# Patient Record
Sex: Male | Born: 1935 | Race: White | Hispanic: No | Marital: Married | Smoking: Former smoker
Health system: Southern US, Community
[De-identification: ages and names within clinical notes are randomized; demographics above are authoritative.]

## PROBLEM LIST (undated history)

## (undated) DIAGNOSIS — I509 Heart failure, unspecified: Secondary | ICD-10-CM

## (undated) DIAGNOSIS — M1990 Unspecified osteoarthritis, unspecified site: Secondary | ICD-10-CM

## (undated) DIAGNOSIS — E7800 Pure hypercholesterolemia, unspecified: Secondary | ICD-10-CM

## (undated) DIAGNOSIS — Z973 Presence of spectacles and contact lenses: Secondary | ICD-10-CM

## (undated) DIAGNOSIS — I4891 Unspecified atrial fibrillation: Secondary | ICD-10-CM

## (undated) DIAGNOSIS — K219 Gastro-esophageal reflux disease without esophagitis: Secondary | ICD-10-CM

## (undated) DIAGNOSIS — I2510 Atherosclerotic heart disease of native coronary artery without angina pectoris: Secondary | ICD-10-CM

## (undated) DIAGNOSIS — N200 Calculus of kidney: Secondary | ICD-10-CM

## (undated) DIAGNOSIS — H269 Unspecified cataract: Secondary | ICD-10-CM

## (undated) DIAGNOSIS — N289 Disorder of kidney and ureter, unspecified: Secondary | ICD-10-CM

## (undated) DIAGNOSIS — I10 Essential (primary) hypertension: Secondary | ICD-10-CM

## (undated) DIAGNOSIS — I252 Old myocardial infarction: Secondary | ICD-10-CM

## (undated) DIAGNOSIS — I350 Nonrheumatic aortic (valve) stenosis: Secondary | ICD-10-CM

## (undated) DIAGNOSIS — K621 Rectal polyp: Secondary | ICD-10-CM

## (undated) DIAGNOSIS — R55 Syncope and collapse: Secondary | ICD-10-CM

## (undated) DIAGNOSIS — K4090 Unilateral inguinal hernia, without obstruction or gangrene, not specified as recurrent: Secondary | ICD-10-CM

## (undated) DIAGNOSIS — D351 Benign neoplasm of parathyroid gland: Secondary | ICD-10-CM

## (undated) HISTORY — PX: ORIF FEMUR FRACTURE: SHX2119

## (undated) HISTORY — DX: Rectal polyp: K62.1

## (undated) HISTORY — DX: Unspecified atrial fibrillation: I48.91

## (undated) HISTORY — PX: UMBILICAL HERNIA REPAIR: SHX196

## (undated) HISTORY — DX: Disorder of kidney and ureter, unspecified: N28.9

## (undated) HISTORY — PX: OTHER SURGICAL HISTORY: SHX169

## (undated) HISTORY — DX: Pure hypercholesterolemia, unspecified: E78.00

## (undated) HISTORY — PX: INGUINAL HERNIA REPAIR: SUR1180

## (undated) HISTORY — DX: Atherosclerotic heart disease of native coronary artery without angina pectoris: I25.10

## (undated) HISTORY — PX: CATARACT EXTRACTION: SUR2

## (undated) HISTORY — PX: FRACTURE SURGERY: SHX138

## (undated) HISTORY — DX: Benign neoplasm of parathyroid gland: D35.1

## (undated) HISTORY — DX: Nonrheumatic aortic (valve) stenosis: I35.0

## (undated) HISTORY — PX: COLONOSCOPY W/ BIOPSIES AND POLYPECTOMY: SHX1376

## (undated) HISTORY — DX: Hypercalcemia: E83.52

## (undated) HISTORY — DX: Essential (primary) hypertension: I10

---

## 1996-07-19 HISTORY — PX: CORONARY STENT PLACEMENT: SHX1402

## 1997-07-22 ENCOUNTER — Inpatient Hospital Stay (HOSPITAL_COMMUNITY): Admission: EM | Admit: 1997-07-22 | Discharge: 1997-07-26 | Payer: Self-pay | Admitting: Emergency Medicine

## 1997-07-28 ENCOUNTER — Other Ambulatory Visit: Admission: RE | Admit: 1997-07-28 | Discharge: 1997-07-28 | Payer: Self-pay | Admitting: Orthopedic Surgery

## 1997-08-04 ENCOUNTER — Other Ambulatory Visit: Admission: RE | Admit: 1997-08-04 | Discharge: 1997-08-04 | Payer: Self-pay | Admitting: Orthopedic Surgery

## 1997-08-11 ENCOUNTER — Other Ambulatory Visit: Admission: RE | Admit: 1997-08-11 | Discharge: 1997-08-11 | Payer: Self-pay | Admitting: Orthopedic Surgery

## 1997-08-14 ENCOUNTER — Other Ambulatory Visit: Admission: RE | Admit: 1997-08-14 | Discharge: 1997-08-14 | Payer: Self-pay | Admitting: Orthopedic Surgery

## 1997-08-18 ENCOUNTER — Other Ambulatory Visit: Admission: RE | Admit: 1997-08-18 | Discharge: 1997-08-18 | Payer: Self-pay | Admitting: Orthopedic Surgery

## 1997-08-20 ENCOUNTER — Other Ambulatory Visit: Admission: RE | Admit: 1997-08-20 | Discharge: 1997-08-20 | Payer: Self-pay | Admitting: Cardiology

## 1998-05-26 ENCOUNTER — Emergency Department (HOSPITAL_COMMUNITY): Admission: EM | Admit: 1998-05-26 | Discharge: 1998-05-26 | Payer: Self-pay | Admitting: Emergency Medicine

## 1998-05-26 ENCOUNTER — Encounter: Payer: Self-pay | Admitting: Emergency Medicine

## 1998-05-28 ENCOUNTER — Emergency Department (HOSPITAL_COMMUNITY): Admission: EM | Admit: 1998-05-28 | Discharge: 1998-05-28 | Payer: Self-pay | Admitting: Emergency Medicine

## 1999-11-18 ENCOUNTER — Encounter: Payer: Self-pay | Admitting: *Deleted

## 1999-11-18 ENCOUNTER — Encounter: Admission: RE | Admit: 1999-11-18 | Discharge: 1999-11-18 | Payer: Self-pay | Admitting: *Deleted

## 2001-04-18 ENCOUNTER — Ambulatory Visit (HOSPITAL_COMMUNITY): Admission: RE | Admit: 2001-04-18 | Discharge: 2001-04-18 | Payer: Self-pay | Admitting: Gastroenterology

## 2002-10-20 ENCOUNTER — Encounter: Payer: Self-pay | Admitting: General Surgery

## 2002-10-22 ENCOUNTER — Ambulatory Visit (HOSPITAL_COMMUNITY): Admission: RE | Admit: 2002-10-22 | Discharge: 2002-10-22 | Payer: Self-pay | Admitting: General Surgery

## 2002-10-23 ENCOUNTER — Emergency Department (HOSPITAL_COMMUNITY): Admission: AD | Admit: 2002-10-23 | Discharge: 2002-10-23 | Payer: Self-pay | Admitting: Emergency Medicine

## 2002-10-23 ENCOUNTER — Emergency Department (HOSPITAL_COMMUNITY): Admission: EM | Admit: 2002-10-23 | Discharge: 2002-10-23 | Payer: Self-pay | Admitting: Emergency Medicine

## 2002-10-24 ENCOUNTER — Emergency Department (HOSPITAL_COMMUNITY): Admission: EM | Admit: 2002-10-24 | Discharge: 2002-10-24 | Payer: Self-pay | Admitting: Emergency Medicine

## 2006-05-16 ENCOUNTER — Ambulatory Visit (HOSPITAL_COMMUNITY): Admission: RE | Admit: 2006-05-16 | Discharge: 2006-05-16 | Payer: Self-pay | Admitting: Surgery

## 2006-09-18 ENCOUNTER — Ambulatory Visit (HOSPITAL_COMMUNITY): Admission: RE | Admit: 2006-09-18 | Discharge: 2006-09-18 | Payer: Self-pay | Admitting: Family Medicine

## 2006-10-24 ENCOUNTER — Encounter: Admission: RE | Admit: 2006-10-24 | Discharge: 2006-10-24 | Payer: Self-pay | Admitting: Family Medicine

## 2008-08-17 ENCOUNTER — Inpatient Hospital Stay (HOSPITAL_COMMUNITY): Admission: EM | Admit: 2008-08-17 | Discharge: 2008-08-18 | Payer: Self-pay | Admitting: Emergency Medicine

## 2008-08-17 ENCOUNTER — Ambulatory Visit: Payer: Self-pay | Admitting: Internal Medicine

## 2008-08-17 ENCOUNTER — Encounter: Payer: Self-pay | Admitting: Cardiology

## 2008-08-18 HISTORY — PX: CARDIAC CATHETERIZATION: SHX172

## 2009-04-29 ENCOUNTER — Ambulatory Visit (HOSPITAL_BASED_OUTPATIENT_CLINIC_OR_DEPARTMENT_OTHER): Admission: RE | Admit: 2009-04-29 | Discharge: 2009-04-29 | Payer: Self-pay | Admitting: General Surgery

## 2009-11-23 ENCOUNTER — Encounter
Admission: RE | Admit: 2009-11-23 | Discharge: 2009-11-23 | Payer: Self-pay | Admitting: Physical Medicine and Rehabilitation

## 2010-01-22 ENCOUNTER — Emergency Department (HOSPITAL_COMMUNITY)
Admission: EM | Admit: 2010-01-22 | Discharge: 2010-01-22 | Payer: Self-pay | Source: Home / Self Care | Admitting: Emergency Medicine

## 2010-02-27 ENCOUNTER — Encounter: Payer: Self-pay | Admitting: Surgery

## 2010-03-25 DIAGNOSIS — N529 Male erectile dysfunction, unspecified: Secondary | ICD-10-CM | POA: Insufficient documentation

## 2010-03-25 DIAGNOSIS — E559 Vitamin D deficiency, unspecified: Secondary | ICD-10-CM | POA: Insufficient documentation

## 2010-04-18 LAB — DIFFERENTIAL
Basophils Relative: 0 % (ref 0–1)
Eosinophils Absolute: 0.1 10*3/uL (ref 0.0–0.7)
Lymphs Abs: 0.6 10*3/uL — ABNORMAL LOW (ref 0.7–4.0)
Monocytes Relative: 5 % (ref 3–12)
Neutro Abs: 8.9 10*3/uL — ABNORMAL HIGH (ref 1.7–7.7)
Neutrophils Relative %: 88 % — ABNORMAL HIGH (ref 43–77)

## 2010-04-18 LAB — CBC
HCT: 40.5 % (ref 39.0–52.0)
Hemoglobin: 13.8 g/dL (ref 13.0–17.0)
MCH: 31.9 pg (ref 26.0–34.0)
MCHC: 34.1 g/dL (ref 30.0–36.0)
MCV: 93.8 fL (ref 78.0–100.0)
RDW: 14 % (ref 11.5–15.5)

## 2010-04-18 LAB — URINALYSIS, ROUTINE W REFLEX MICROSCOPIC
Bilirubin Urine: NEGATIVE
Glucose, UA: NEGATIVE mg/dL
Hgb urine dipstick: NEGATIVE
Ketones, ur: NEGATIVE mg/dL
Protein, ur: NEGATIVE mg/dL
Urobilinogen, UA: 0.2 mg/dL (ref 0.0–1.0)

## 2010-04-18 LAB — POCT CARDIAC MARKERS
Myoglobin, poc: 236 ng/mL (ref 12–200)
Troponin i, poc: <0.05 ng/mL (ref 0.00–0.09)

## 2010-04-18 LAB — COMPREHENSIVE METABOLIC PANEL
ALT: 22 U/L (ref 0–53)
BUN: 31 mg/dL — ABNORMAL HIGH (ref 6–23)
CO2: 25 mEq/L (ref 19–32)
Calcium: 10.3 mg/dL (ref 8.4–10.5)
GFR calc non Af Amer: 44 mL/min — ABNORMAL LOW (ref >60)
Glucose, Bld: 119 mg/dL — ABNORMAL HIGH (ref 70–99)
Sodium: 134 mEq/L — ABNORMAL LOW (ref 135–145)
Total Protein: 7.8 g/dL (ref 6.0–8.3)

## 2010-04-18 LAB — LIPASE, BLOOD: Lipase: 31 U/L (ref 11–59)

## 2010-05-02 LAB — COMPREHENSIVE METABOLIC PANEL
AST: 24 U/L (ref 0–37)
Albumin: 3.9 g/dL (ref 3.5–5.2)
BUN: 38 mg/dL — ABNORMAL HIGH (ref 6–23)
Calcium: 10.1 mg/dL (ref 8.4–10.5)
Creatinine, Ser: 1.69 mg/dL — ABNORMAL HIGH (ref 0.4–1.5)
GFR calc Af Amer: 48 mL/min — ABNORMAL LOW (ref >60)
Total Protein: 7.3 g/dL (ref 6.0–8.3)

## 2010-05-02 LAB — CBC
HCT: 36.8 % — ABNORMAL LOW (ref 39.0–52.0)
MCV: 95.6 fL (ref 78.0–100.0)
Platelets: 162 10*3/uL (ref 150–400)
RDW: 13.3 % (ref 11.5–15.5)
WBC: 6.6 10*3/uL (ref 4.0–10.5)

## 2010-05-02 LAB — DIFFERENTIAL
Basophils Absolute: 0 10*3/uL (ref 0.0–0.1)
Eosinophils Relative: 2 % (ref 0–5)
Lymphocytes Relative: 29 % (ref 12–46)
Lymphs Abs: 1.9 10*3/uL (ref 0.7–4.0)
Monocytes Absolute: 0.7 10*3/uL (ref 0.1–1.0)
Monocytes Relative: 10 % (ref 3–12)
Neutro Abs: 3.9 10*3/uL (ref 1.7–7.7)

## 2010-05-15 LAB — IRON AND TIBC: Iron: 71 ug/dL (ref 42–135)

## 2010-05-15 LAB — BASIC METABOLIC PANEL
BUN: 20 mg/dL (ref 6–23)
CO2: 27 mEq/L (ref 19–32)
CO2: 27 mEq/L (ref 19–32)
Calcium: 10.9 mg/dL — ABNORMAL HIGH (ref 8.4–10.5)
Chloride: 108 mEq/L (ref 96–112)
Chloride: 110 mEq/L (ref 96–112)
Creatinine, Ser: 1.38 mg/dL (ref 0.4–1.5)
GFR calc Af Amer: 47 mL/min — ABNORMAL LOW (ref >60)
Glucose, Bld: 96 mg/dL (ref 70–99)
Potassium: 4.2 mEq/L (ref 3.5–5.1)
Potassium: 4.6 mEq/L (ref 3.5–5.1)
Sodium: 143 mEq/L (ref 135–145)

## 2010-05-15 LAB — CARDIAC PANEL(CRET KIN+CKTOT+MB+TROPI)
CK, MB: 2.7 ng/mL (ref 0.3–4.0)
CK, MB: 2.8 ng/mL (ref 0.3–4.0)
Total CK: 116 U/L (ref 7–232)
Total CK: 117 U/L (ref 7–232)
Troponin I: 0.02 ng/mL (ref 0.00–0.06)

## 2010-05-15 LAB — CBC
HCT: 36.5 % — ABNORMAL LOW (ref 39.0–52.0)
HCT: 36.5 % — ABNORMAL LOW (ref 39.0–52.0)
Hemoglobin: 12.1 g/dL — ABNORMAL LOW (ref 13.0–17.0)
MCHC: 33.3 g/dL (ref 30.0–36.0)
MCHC: 33.6 g/dL (ref 30.0–36.0)
MCV: 95.6 fL (ref 78.0–100.0)
Platelets: 149 10*3/uL — ABNORMAL LOW (ref 150–400)
RBC: 3.78 MIL/uL — ABNORMAL LOW (ref 4.22–5.81)
RDW: 13.7 % (ref 11.5–15.5)
WBC: 6.5 10*3/uL (ref 4.0–10.5)

## 2010-05-15 LAB — LIPID PANEL
LDL Cholesterol: 58 mg/dL (ref 0–99)
VLDL: 19 mg/dL (ref 0–40)

## 2010-05-15 LAB — DIFFERENTIAL
Basophils Absolute: 0 10*3/uL (ref 0.0–0.1)
Lymphocytes Relative: 30 % (ref 12–46)
Lymphs Abs: 2 10*3/uL (ref 0.7–4.0)
Monocytes Absolute: 0.8 10*3/uL (ref 0.1–1.0)
Neutro Abs: 3.9 10*3/uL (ref 1.7–7.7)

## 2010-05-15 LAB — GLUCOSE, CAPILLARY: Glucose-Capillary: 87 mg/dL (ref 70–99)

## 2010-05-15 LAB — TSH: TSH: 1.453 u[IU]/mL (ref 0.350–4.500)

## 2010-05-15 LAB — CK TOTAL AND CKMB (NOT AT ARMC): CK, MB: 3 ng/mL (ref 0.3–4.0)

## 2010-05-15 LAB — PROTIME-INR
INR: 1.1 (ref 0.00–1.49)
Prothrombin Time: 14.4 seconds (ref 11.6–15.2)

## 2010-05-15 LAB — BRAIN NATRIURETIC PEPTIDE: Pro B Natriuretic peptide (BNP): 126 pg/mL — ABNORMAL HIGH (ref 0.0–100.0)

## 2010-05-15 LAB — PHOSPHORUS: Phosphorus: 3 mg/dL (ref 2.3–4.6)

## 2010-05-15 LAB — VITAMIN B12: Vitamin B-12: 616 pg/mL (ref 211–911)

## 2010-05-15 LAB — RETICULOCYTES: Retic Count, Absolute: 30.2 10*3/uL (ref 19.0–186.0)

## 2010-05-15 LAB — TROPONIN I: Troponin I: 0.02 ng/mL (ref 0.00–0.06)

## 2010-05-18 ENCOUNTER — Telehealth: Payer: Self-pay | Admitting: Cardiology

## 2010-05-18 ENCOUNTER — Encounter: Payer: Self-pay | Admitting: Cardiology

## 2010-05-18 DIAGNOSIS — N2889 Other specified disorders of kidney and ureter: Secondary | ICD-10-CM | POA: Insufficient documentation

## 2010-05-18 DIAGNOSIS — I10 Essential (primary) hypertension: Secondary | ICD-10-CM | POA: Insufficient documentation

## 2010-05-18 DIAGNOSIS — I219 Acute myocardial infarction, unspecified: Secondary | ICD-10-CM | POA: Insufficient documentation

## 2010-05-18 DIAGNOSIS — E7800 Pure hypercholesterolemia, unspecified: Secondary | ICD-10-CM | POA: Insufficient documentation

## 2010-05-18 DIAGNOSIS — I2510 Atherosclerotic heart disease of native coronary artery without angina pectoris: Secondary | ICD-10-CM | POA: Insufficient documentation

## 2010-05-18 DIAGNOSIS — I350 Nonrheumatic aortic (valve) stenosis: Secondary | ICD-10-CM | POA: Insufficient documentation

## 2010-05-18 DIAGNOSIS — K621 Rectal polyp: Secondary | ICD-10-CM | POA: Insufficient documentation

## 2010-05-18 NOTE — Telephone Encounter (Signed)
Received call from patient, would like to have scripts verified with him prior to being filled.

## 2010-05-23 ENCOUNTER — Telehealth: Payer: Self-pay | Admitting: Cardiology

## 2010-05-26 ENCOUNTER — Ambulatory Visit (INDEPENDENT_AMBULATORY_CARE_PROVIDER_SITE_OTHER): Payer: Medicare Other | Admitting: Cardiology

## 2010-05-26 ENCOUNTER — Encounter: Payer: Self-pay | Admitting: Cardiology

## 2010-05-26 DIAGNOSIS — I359 Nonrheumatic aortic valve disorder, unspecified: Secondary | ICD-10-CM

## 2010-05-26 DIAGNOSIS — I2510 Atherosclerotic heart disease of native coronary artery without angina pectoris: Secondary | ICD-10-CM

## 2010-05-26 DIAGNOSIS — E7800 Pure hypercholesterolemia, unspecified: Secondary | ICD-10-CM

## 2010-05-26 DIAGNOSIS — I10 Essential (primary) hypertension: Secondary | ICD-10-CM

## 2010-05-26 DIAGNOSIS — I350 Nonrheumatic aortic (valve) stenosis: Secondary | ICD-10-CM

## 2010-05-26 NOTE — Patient Instructions (Signed)
Continue current medications.  Avoid sodium intake.  We will see you for an office visit in 6 months.

## 2010-05-27 ENCOUNTER — Encounter: Payer: Self-pay | Admitting: Cardiology

## 2010-05-27 NOTE — Assessment & Plan Note (Signed)
Blood pressure is well-controlled. I've recommended sodium restriction particularly in light of his mild edema.

## 2010-05-27 NOTE — Assessment & Plan Note (Signed)
Excellent lipid levels on Vytorin.

## 2010-05-27 NOTE — Progress Notes (Signed)
Jonathan Acevedo Date of Birth: Apr 11, 1928   History of Present Illness: [name] is seen for followup today. He states he has been doing very well. He remains very active with his exercise program. He denies any chest pain. He has shortness of breath with more than usual exertion. He has chronic 1+ pedal edema. He denies any orthopnea or PND.  Current Outpatient Prescriptions on File Prior to Visit  Medication Sig Dispense Refill  . aspirin 325 MG EC tablet Take 325 mg by mouth daily.        . Calcium-Vitamin D (CALTRATE 600 PLUS-VIT D PO) Take 2.5 capsules by mouth daily. One capsule am and 1 1/2 capsule pm       . diclofenac sodium (VOLTAREN) 1 % GEL Apply 1 application topically. Up to 4 times a day as needed       . enalapril (VASOTEC) 20 MG tablet Take 20 mg by mouth daily.        Marland Kitchen ezetimibe-simvastatin (VYTORIN) 10-20 MG per tablet Take 1 tablet by mouth at bedtime.        Marland Kitchen HYDROcodone-acetaminophen (VICODIN) 5-500 MG per tablet Take 1 tablet by mouth. Can take 2 times daily as needed for pain       . lidocaine (LIDODERM) 5 % Place 1 patch onto the skin daily. Remove & Discard patch within 12 hours or as directed by MD       . Multiple Vitamins-Minerals (CENTRUM SILVER PO) Take 1 capsule by mouth daily.          Allergies  Allergen Reactions  . Actonel     Past Medical History  Diagnosis Date  . Myocardial infarction 1998  . Coronary artery disease     w remote anterior myocardial infarction  . Hypertension   . Renal insufficiency   . Aortic stenosis   . Rectal polyp   . Hypercholesteremia     Past Surgical History  Procedure Date  . Cardiac catheterization 08/18/2008  . Umbilical hernia repair   . Inguinal hernia repair     left  . Orif femur fracture     right  . Cataract extraction     History  Smoking status  . Former Smoker -- 1.0 packs/day for 45 years  . Types: Cigarettes  . Quit date: 07/19/1996  Smokeless tobacco  . Never Used    History    Alcohol Use No    History reviewed. No pertinent family history.  Review of Systems: The review of systems is positive for chronic pedal edema with some stasis dermatitis. He had a recent dental procedure in which lidocaine and epinephrine was used for anesthesia and he was noted to have an increased heart rate briefly.  All other systems were reviewed and are negative.  Physical Exam: BP 118/76  Pulse 56  Ht 6' (1.829 m)  Wt 212 lb (96.163 kg)  BMI 28.75 kg/m2 He is a pleasant white male in no acute distress. HEENT exam is unremarkable. He has no JVD, adenopathy, thyromegaly, or bruits. Lungs are clear. Cardiac exam reveals a regular rate and rhythm with a grade 2/6 systolic murmur heard best at the right upper sternal border. There is no gallop or diastolic murmur. Abdomen is soft and nontender without masses or bruits. He has 1+ ankle edema. He is alert and oriented x3. Cranial nerves II through XII are intact. Skin is warm and dry.  LABORATORY DATA: Date March 25, 2010 total cholesterol was 143, HDL 50,  triglycerides 108, LDL 62. Urinalysis was normal.  Assessment / Plan:

## 2010-05-27 NOTE — Assessment & Plan Note (Signed)
Mild stenosis by echocardiogram in July of 2010.

## 2010-05-27 NOTE — Assessment & Plan Note (Signed)
Patient has minimal symptoms of dyspnea on exertion. He has no significant angina. Cardiac catheterization in June of 2010 showed nonobstructive disease. We will continue with his current medical therapy and risk factor modification.

## 2010-06-21 NOTE — H&P (Signed)
NAME:  Jonathan Acevedo, SIMONES NO.:  [id number]   MEDICAL RECORD NO.:  [id number]          PATIENT TYPE:  EMS   LOCATION:  ED                           FACILITY:  [id number]   PHYSICIAN:  Therisa Doyne, MD    DATE OF BIRTH:  [birth year]   DATE OF ADMISSION:  08/17/2008  DATE OF DISCHARGE:                              HISTORY & PHYSICAL   PRIMARY CARDIOLOGIST:  Peter M. Swaziland, M.D.   CHIEF COMPLAINT:  Chest pain.   HISTORY OF PRESENT ILLNESS:  A 77 year old white male with a past  medical history significant for coronary disease status post myocardial  infarction, status post percutaneous coronary intervention 10 years ago  who presents for evaluation of 10 minutes of chest pain.  The patient  was in his usual state of health this evening, and at rest, he had acute  substernal chest pressure located in his retrosternal region.  This  radiated to his jaw and was associated with some mild diaphoresis.  There was no shortness of breath or nausea.  The pain lasted  approximately 10 minutes and then resolved on its own.  Because of these  symptoms, he came to the emergency room for further evaluation.   Of note, the patient was recently scheduled to have his annual exercise  stress test but was unable to give his heart rate high enough to get his  stress evaluation.  Because of that, he was scheduled for an outpatient  nuclear stress test later today.  Currently, he denies any chest pain.   REVIEW OF SYSTEMS:  All systems were reviewed and are negative except  those mentioned above in the history of present illness.   PAST MEDICAL HISTORY:  1. Coronary artery disease status post myocardial infarction      approximately 10 years ago.  He reports having two stents placed at      that time.  2. Hypertension.  3. Hyperlipidemia.  4. History of gum infections.   SOCIAL HISTORY:  The patient denies any tobacco, alcohol or drugs.  He  is retired from [employer].   FAMILY  HISTORY:  Negative for coronary disease.   ALLERGIES:  ACTONEL.   MEDICATIONS:  1. Caltrate.  2. Centrum.  3. Doxycycline 20 mg b.i.d.  4. Ecotrin 325 mg daily.  5. Enalapril 20 mg daily.  6. Vytorin 10/20 mg daily.  7. Boniva.   PHYSICAL EXAMINATION:  VITAL SIGNS:  Temperature afebrile, blood  pressure is 148/75, pulse 60, respirations 20, oxygen saturation 96% on  room air.  GENERAL:  In no acute distress.  HEENT:  Normocephalic and atraumatic.  Pupils equal, round and reactive  to light and accommodation.  NECK:  Supple.  No lymphadenopathy or jugular venous distention.  No  masses.  CARDIOVASCULAR:  Regular rate and rhythm.  A 2/6 systolic murmur heard  in the left upper sternal border.  CHEST:  Clear to auscultation bilaterally.  ABDOMEN:  Positive bowel sounds, soft, nontender, nondistended.  EXTREMITIES:  No clubbing, cyanosis or edema.  BACK:  No CVA tenderness.  SKIN:  No rash.  PERTINENT DATA:  1. Hemoglobin 12.1, platelets 145, BUN 34, creatinine 1.7 which is      stable when compared to 2004, elevated calcium at 10.9 which is      stable compared to 2004, troponin 0.02.  2. EKG shows sinus bradycardia at 55 beats per minute, anterior septal      myocardial infarction which is old, left anterior fascicular block.      No active signs of ischemia.  3. Chest x-ray shows no acute cardiopulmonary disease.   ASSESSMENT/PLAN:  Mr. Pak is a 77 year old white male with a past  medical history significant for coronary disease status post myocardial  infarction 10 years ago who presents for evaluation of 10 minutes of  chest discomfort.   1. Will admit the patient to the [facility] cardiology service under      Dr. Cathren Laine care.   1. Chest pain.  At this time, his symptoms are consistent with angina;      however, other etiologies include musculoskeletal pain or reflux      disease.  His EKG does not have any active signs of ischemia, and      his first set  of cardiac enzymes are negative.  We will go ahead      and rule the patient out for myocardial infarction with serial      cardiac enzymes.  If he rules out for myocardial infarction, I      think he would benefit from a nuclear stress test.  This could be      performed as an outpatient later this week.  In the interim time,      will continue him on aspirin, enalapril and Vytorin.  We will not      be using a beta blocker secondary to his bradycardia.      Additionally, I will not anticoagulate the patient or use a 2b/3a      inhibitor unless the patient rules in for a myocardial infarction.   1. Hyperlipidemia.  Check a fasting profile.  Continue Vytorin.   1. Hypertension, stable.  Continue home medications.   1. Fluids, electrolytes, nutrition.  Saline lock, IV fluids.      Electrolytes are stable.  NPO.   1. Deep vein thrombosis not indicated as the patient is ambulatory.     Therisa Doyne, MD  Electronically Signed    SJT/MEDQ  D:  08/17/2008  T:  08/17/2008  Job:  [id number]

## 2010-06-21 NOTE — Cardiovascular Report (Signed)
NAME:  Jonathan, Acevedo NO.:  [id number]   MEDICAL RECORD NO.:  [id number]          PATIENT TYPE:  AMB   LOCATION:  CATH                         FACILITY:  [facility]   PHYSICIAN:  Peter M. Swaziland, M.D.  DATE OF BIRTH:  23-Feb-1928   DATE OF PROCEDURE:  08/18/2008  DATE OF DISCHARGE:  08/18/2008                            CARDIAC CATHETERIZATION   INDICATIONS FOR PROCEDURE:  The patient is a 77 year old white male with  known history of coronary artery disease, status post anterior  myocardial infarction in 1998.  He had extensive stenting of the LAD at  that time.  He presents with symptoms of acute chest pain.  Recent  stress test had indicated decline in his exercise tolerance with  symptoms of dyspnea.   PROCEDURES:  Left heart catheterization, coronary and left ventricular  angiography.   ACCESS:  Via the right femoral artery using standard Seldinger  technique.   EQUIPMENT:  6-French 4-cm right and left Judkins catheter, 6-French  pigtail catheter, and 6-French arterial sheath   MEDICATIONS:  Local anesthesia 1% Xylocaine, Versed 2 mg IV, fentanyl 25  mcg IV.   CONTRAST:  140 mL of Omnipaque.   HEMODYNAMIC DATA:  Aortic pressure is 129/63 with a mean of 89 mmHg,  left ventricular pressure is 142 with an EDP of 11 mmHg.  There was  proximally 13-mm gradient across the aortic valve by pullback.   ANGIOGRAPHIC DATA:  Left coronary artery arises and distributes  normally.  There is 20% narrowing in the ostium of the left main  coronary artery.  It otherwise appears normal.   The left anterior descending artery demonstrates a long segment of  stenting in the proximal vessel.  This is widely patent with less than  10-20% irregularities in the stented segment.  There is a 30% focal  narrowing in the mid LAD following the stent.   Left circumflex coronary artery is a large vessel and has minor  irregularities less than 10%.   The right coronary artery is a  large dominant vessel.  There is a 20%  narrowing in the proximal vessel with moderate calcification.  Otherwise, no significant disease is noted.   Left ventricular angiography was performed in the RAO view.  This  demonstrates normal left ventricular size with mid to distal anterior  and apical akinesia.  Ejection fraction is estimated 45%.  There is no  significant mitral insufficiency.  The aorta appears calcified, but does  not appear to be dilated.   FINAL INTERPRETATION:  1. Nonobstructive atherosclerotic coronary artery disease.  He has      continued patency long term of the stented segment in the proximal      left anterior descending.  2. Mild-to-moderate left ventricular dysfunction.  3. Mild aortic stenosis.   PLAN:  We would recommend continue medical therapy.           ______________________________  Peter M. Swaziland, M.D.     PMJ/MEDQ  D:  08/18/2008  T:  08/18/2008  Job:  [id number]   cc:   Tammy R. Collins [place], M.D.

## 2010-06-21 NOTE — Discharge Summary (Signed)
NAME:  Jonathan Acevedo, Jonathan Acevedo NO.:  [id number]   MEDICAL RECORD NO.:  [id number]          PATIENT TYPE:  INP   LOCATION:  1427                         FACILITY:  [facility]   PHYSICIAN:  Peter M. Swaziland, M.D.  DATE OF BIRTH:  1928/04/24   DATE OF ADMISSION:  08/17/2008  DATE OF DISCHARGE:  08/18/2008                               DISCHARGE SUMMARY   HISTORY OF PRESENT ILLNESS:  Mr. Debold is a 77 year old white male with  known history of coronary artery disease.  He had an anterior myocardial  infarction in 1998.  This was treated with fairly extensive stenting of  the proximal LAD.  He has been followed with yearly stress tests, and  his recent stress test demonstrated a decrease in his exercise tolerance  with associated dyspnea.  He had no chest pain or dynamic ECG changes.  He was scheduled for a stress nuclear study the day after his admission.  He presented on the day of admission with acute onset of substernal  chest pain radiating to his jaw and associated with diaphoresis.  This  lasted approximately 10 minutes and then resolved spontaneously.  Given  his prior history, he was admitted for further evaluation.  For details  of his past medical history, social history, family history and physical  exam, please see admission history and physical.   LABORATORY DATA:  His ECG shows sinus bradycardia with evidence of old  anterior myocardial infarction with left axis deviation.  There were no  acute ST or T-wave changes.  His chest x-ray showed no active disease.  His white count was 6900, hemoglobin 12.1, hematocrit 36.5 and platelets  145,000.  Sodium was 143, potassium 4.6, chloride 108, CO2 of 27,  glucose 96, BUN 34, creatinine 1.72 and calcium of 10.9.  Initial point  of care cardiac markers were negative.  Total cholesterol was 120 with  an LDL of 58, HDL of 43 and triglycerides of 96.  Phosphorus level was  3.0.  Coags were normal.  BNP level was 126.  TSH was  1.453.   HOSPITAL COURSE:  The patient was admitted to telemetry monitoring.  He  had an anemia panel drawn which demonstrated a normal iron level of 71  with TIBC of 289.  His percent saturation was 25%.  B12 and folate  levels were normal.  His follow-up blood count the next day showed a  hemoglobin of 12.3.  Heme stool studies were still pending at the time  of discharge.  The patient did report that he has had follow-up  colonoscopies every 5 years.  Serial cardiac enzymes were negative for  myocardial infarction.  Given his recent history, it was recommended the  patient undergo coronary angiography for further evaluation of his acute  chest pain.  Given his renal insufficiency,  He was hydrated overnight  with subsequent decline in his BUN to 20 and his creatinine to 1.38.  His electrolytes were normal.  We did obtain a renal ultrasound which  showed no obstruction.  There were bilateral simple renal cysts.  It was  noted there  was moderate residual volume present in the bladder.  An  echocardiogram was also obtained.  This demonstrated evidence of mid to  distal septal and apical akinesia with anterior hypokinesia.  Ejection  fraction was estimated at 40%.  There was mild calcific aortic stenosis  with a valve area of approximately 1.9 cm2.  Mean gradient was 12 mmHg  with a peak gradient of 24 mmHg.  This was not significantly changed  compared to an echocardiogram in 2008.  He had moderate mitral annular  calcification and mild left atrial enlargement.  On August 18, 2008, he  underwent cardiac catheterization.  This demonstrated nonobstructive  coronary disease.  He had 20% ostial left main stenosis.  The LAD was  widely patent in the stented segment.  There was a 30% stenosis in the  mid LAD.  The right coronary artery and left circumflex coronary artery  showed minimal irregularities.  Left ventricular angiography showed  anterior and apical akinesia with overall ejection  fraction of  approximately 45%.  His filling pressures were normal.  By cath, he had  an aortic valve gradient of approximately 13 mmHg, consistent with mild  aortic stenosis.  Serum PTH was pending at the time of discharge.  Of  note, with just hydration his serum calcium level declined to 9.9.  Given his negative cardiac findings, it was felt the patient was stable  for discharge, and he will be continued on his medical therapy.   DISCHARGE DIAGNOSES:  1. Acute chest pain, noncardiac.  2. Coronary artery disease with old anterior myocardial infarction.      Status post remote stenting of the left anterior descending.  3. Mild to moderate left ventricular dysfunction.  4. Hypertension.  5. Hyperlipidemia.  6. Mild anemia.  7. Mild renal insufficiency.  8. Hypercalcemia, improved.   DISCHARGE MEDICATIONS:  1. Enalapril 20 mg daily.  2. Vytorin 10/20 mg daily.  3. Boniva once monthly.  4. Ecotrin 325 mg daily.  5. Doxycycline 200 mg b.i.d.  6. Centrum multivitamin daily.  7. Caltrate plus D daily.   FOLLOWUP:  The patient will have follow up with Dr. Swaziland in  approximately 2 weeks.   DISCHARGE STATUS:  Improved.           ______________________________  Peter M. Swaziland, M.D.     PMJ/MEDQ  D:  08/18/2008  T:  08/18/2008  Job:  [id number]   cc:   Tammy R. Collins [place], M.D.  Fax: [phone]

## 2010-06-24 NOTE — Op Note (Signed)
NAME:  Jonathan Acevedo, Jonathan Acevedo NO.:  [id number]   MEDICAL RECORD NO.:  [id number]                   PATIENT TYPE:  OIB   LOCATION:  2895                                 FACILITY:  [facility]   PHYSICIAN:  Gita Kudo, M.D.              DATE OF BIRTH:  Jan 30, 1929   DATE OF PROCEDURE:  10/22/2002  DATE OF DISCHARGE:                                 OPERATIVE REPORT   PREOPERATIVE DIAGNOSIS:  Left inguinal hernia.   POSTOPERATIVE DIAGNOSIS:  Left inguinal hernia, large sliding indirect,  large direct.   OPERATION PERFORMED:  Repair left inguinal hernia with Kugel two-layer mesh.   SURGEON:  Gita Kudo, M.D.   ANESTHESIA:  General.   INDICATIONS FOR PROCEDURE:  The patient is a 77 year old male with  symptomatic inguinal hernia who comes in for elective repair.  Because of  cardiac symptoms, [facility] has seen him and declared that he is  fit for surgery from a cardiac standpoint.   OPERATIVE FINDINGS:  The patient had large inguinal hernias as described.  The cord structures were identified as well as the nerves and not injured.   DESCRIPTION OF PROCEDURE:  Under satisfactory general endotracheal  anesthesia, having received 1.0g Ancef preop, the patient's abdomen and  genitalia were prepped and draped in a standard fashion.  A total of 30mL of  Marcaine with epinephrine was infiltrated throughout for good postoperative  analgesia.  A transverse incision was made and carried down to and through  the external ring and external oblique.  Self-retaining retractor was placed  and with good exposure, the cord structures were isolated with a Penrose  drain and the indirect sac identified and dissected high.  Because it was so  broadly based and a slider, I did not open it but instead inverted it.  The  floor of the canal was then opened from the pubis to the internal ring and  blunt dissection used to free the peritoneal contents and reduce  them.  They  were held away with a moistened gauze while the circular portion of the  Kugel mesh was anchored at Cooper's ligament with a 0 Prolene suture.  It  was then unfolded inferiorly and laterally and the gauze removed and the  mesh then unfolded superiorly and medially.  It lay in excellent position  and extended from the pubis under the internal ring to the great vessels.  Then the tabs were cut and one suture used to approximate the superior tab  at the undersurface of the abdominal wall. The canal was then closed over  this by approximating the floor with running 0 Prolene suture and at the  internal ring, after tying, the sutures were left long.  The oval portion of  the mesh was then anchored at the internal ring with the previous sutures  and tacked around the periphery with three stitches to the internal  oblique,  inguinal ligament and pubis.  The tails were then brought around the cord  and sutured to each other.  The wound then lavaged the saline, checked for  hemostasis, and closed in layers with running  2-0 Vicryl for external oblique, 2-0 Vicryl for deep fascia, 3-0 Vicryl for  subcu, Steri-Strips for skin.  Sterile absorbent dressings applied and the  patient went to the recovery room from the operating room in good condition  without complication.                                                Gita Kudo, M.D.    MRL/MEDQ  D:  10/22/2002  T:  10/22/2002  Job:  [id number]   cc:   Tammy R. Collins [place], M.D.  [address]  [place]  [place] [address]  Fax: [phone]   Cassell Clement, M.D.  [address], [address]  [place]  [place] [address]  Fax: [phone]

## 2010-07-08 ENCOUNTER — Other Ambulatory Visit: Payer: Self-pay | Admitting: Cardiology

## 2010-07-08 MED ORDER — EZETIMIBE-SIMVASTATIN 10-20 MG PO TABS: 1.0000 | ORAL_TABLET | Freq: Every day | ORAL | Status: DC

## 2010-07-08 MED ORDER — ENALAPRIL MALEATE 20 MG PO TABS: 20.0000 mg | ORAL_TABLET | Freq: Every day | ORAL | Status: DC

## 2010-07-08 NOTE — Telephone Encounter (Signed)
Called requesting refill on Vytorin and enalapril. Sent to [facility]

## 2010-07-08 NOTE — Telephone Encounter (Signed)
Pt wanted refill called to [facility] of vytorin and enalapril

## 2010-07-14 ENCOUNTER — Emergency Department (HOSPITAL_COMMUNITY)
Admission: EM | Admit: 2010-07-14 | Discharge: 2010-07-14 | Disposition: A | Discharge status: Home / Self Care | Payer: Medicare Other | Attending: Emergency Medicine | Admitting: Emergency Medicine

## 2010-07-14 DIAGNOSIS — W268XXA Contact with other sharp object(s), not elsewhere classified, initial encounter: Secondary | ICD-10-CM | POA: Insufficient documentation

## 2010-07-14 DIAGNOSIS — S81009A Unspecified open wound, unspecified knee, initial encounter: Secondary | ICD-10-CM | POA: Insufficient documentation

## 2010-07-14 DIAGNOSIS — I2510 Atherosclerotic heart disease of native coronary artery without angina pectoris: Secondary | ICD-10-CM | POA: Insufficient documentation

## 2010-07-14 DIAGNOSIS — I10 Essential (primary) hypertension: Secondary | ICD-10-CM | POA: Insufficient documentation

## 2010-07-14 DIAGNOSIS — S91009A Unspecified open wound, unspecified ankle, initial encounter: Secondary | ICD-10-CM | POA: Insufficient documentation

## 2010-07-14 DIAGNOSIS — I252 Old myocardial infarction: Secondary | ICD-10-CM | POA: Insufficient documentation

## 2010-10-03 ENCOUNTER — Other Ambulatory Visit: Payer: Self-pay | Admitting: *Deleted

## 2010-10-03 ENCOUNTER — Other Ambulatory Visit: Payer: Self-pay | Admitting: Cardiology

## 2010-10-03 MED ORDER — ENALAPRIL MALEATE 20 MG PO TABS: 20.0000 mg | ORAL_TABLET | Freq: Every day | ORAL | Status: DC

## 2010-10-03 NOTE — Telephone Encounter (Signed)
escribe medication per fax request  

## 2010-10-03 NOTE — Telephone Encounter (Signed)
Pt called back with name of med enapril? Tab 20 mg 90 day supply to [facility]

## 2010-10-03 NOTE — Telephone Encounter (Signed)
Called requesting refill on enalapril. Advised I had refilled that this AM.

## 2010-10-03 NOTE — Telephone Encounter (Signed)
lm

## 2010-10-03 NOTE — Telephone Encounter (Signed)
Pt needs script faxed to [facility] for Vitorin for 90 day supply.  Please call pt at [phone] to confirm this has been done.

## 2010-11-04 ENCOUNTER — Other Ambulatory Visit: Payer: Self-pay | Admitting: *Deleted

## 2010-11-04 MED ORDER — EZETIMIBE-SIMVASTATIN 10-20 MG PO TABS: 1.0000 | ORAL_TABLET | Freq: Every day | ORAL | Status: DC

## 2010-11-07 ENCOUNTER — Other Ambulatory Visit: Payer: Self-pay | Admitting: Cardiology

## 2010-11-07 MED ORDER — EZETIMIBE-SIMVASTATIN 10-20 MG PO TABS: 1.0000 | ORAL_TABLET | Freq: Every day | ORAL | Status: DC

## 2010-11-11 ENCOUNTER — Other Ambulatory Visit: Payer: Self-pay | Admitting: *Deleted

## 2010-11-14 ENCOUNTER — Other Ambulatory Visit: Payer: Self-pay | Admitting: *Deleted

## 2010-11-14 MED ORDER — EZETIMIBE-SIMVASTATIN 10-20 MG PO TABS: 1.0000 | ORAL_TABLET | Freq: Every day | ORAL | Status: DC

## 2010-11-29 ENCOUNTER — Ambulatory Visit (INDEPENDENT_AMBULATORY_CARE_PROVIDER_SITE_OTHER): Payer: Medicare Other | Admitting: Cardiology

## 2010-11-29 ENCOUNTER — Encounter: Payer: Self-pay | Admitting: Cardiology

## 2010-11-29 VITALS — BP 128/73 | HR 60 | Ht 72.0 in | Wt 214.8 lb

## 2010-11-29 DIAGNOSIS — E7800 Pure hypercholesterolemia, unspecified: Secondary | ICD-10-CM

## 2010-11-29 DIAGNOSIS — I255 Ischemic cardiomyopathy: Secondary | ICD-10-CM | POA: Insufficient documentation

## 2010-11-29 DIAGNOSIS — I2510 Atherosclerotic heart disease of native coronary artery without angina pectoris: Secondary | ICD-10-CM

## 2010-11-29 DIAGNOSIS — I359 Nonrheumatic aortic valve disorder, unspecified: Secondary | ICD-10-CM

## 2010-11-29 DIAGNOSIS — I10 Essential (primary) hypertension: Secondary | ICD-10-CM

## 2010-11-29 DIAGNOSIS — I350 Nonrheumatic aortic (valve) stenosis: Secondary | ICD-10-CM

## 2010-11-29 DIAGNOSIS — I2589 Other forms of chronic ischemic heart disease: Secondary | ICD-10-CM

## 2010-11-29 NOTE — Progress Notes (Signed)
   Adelene Amas Knock Date of Birth: 1928/05/12   History of Present Illness: Mr. Kretschmer is seen for followup today. He states he has been doing very well. He is very active with his exercise program and is back up to running. He is lifting weights. He denies any edema. He has had no dizziness or syncope. He's had no palpitations or shortness of breath.  Current Outpatient Prescriptions on File Prior to Visit  Medication Sig Dispense Refill  . aspirin 325 MG EC tablet Take 325 mg by mouth daily.        Marland Kitchen ezetimibe-simvastatin (VYTORIN) 10-20 MG per tablet Take 1 tablet by mouth at bedtime.  90 tablet  3  . Multiple Vitamins-Minerals (CENTRUM SILVER PO) Take 1 capsule by mouth daily.        Marland Kitchen DISCONTD: enalapril (VASOTEC) 20 MG tablet Take 1 tablet (20 mg total) by mouth daily.  90 tablet  3    Allergies  Allergen Reactions  . Actonel     Past Medical History  Diagnosis Date  . Myocardial infarction 1998  . Coronary artery disease     w remote anterior myocardial infarction  . Hypertension   . Renal insufficiency   . Aortic stenosis   . Rectal polyp   . Hypercholesteremia     Past Surgical History  Procedure Date  . Cardiac catheterization 08/18/2008  . Umbilical hernia repair   . Inguinal hernia repair     left  . Orif femur fracture     right  . Cataract extraction     History  Smoking status  . Former Smoker -- 1.0 packs/day for 45 years  . Types: Cigarettes  . Quit date: 07/19/1996  Smokeless tobacco  . Never Used    History  Alcohol Use No    History reviewed. No pertinent family history.  Review of Systems: The review of systems is positive for chronic pedal edema with some stasis dermatitis. He had a recent dental procedure in which lidocaine and epinephrine was used for anesthesia and he was noted to have an increased heart rate briefly.  All other systems were reviewed and are negative.  Physical Exam: BP 128/73  Pulse 60  Ht 6' (1.829 m)  Wt 214 lb  12.8 oz (97.433 kg)  BMI 29.13 kg/m2 He is a pleasant white male in no acute distress. HEENT exam is unremarkable. He has no JVD, adenopathy, thyromegaly, or bruits. Lungs are clear. Cardiac exam reveals a regular rate and rhythm with a grade 2/6 systolic murmur heard best at the right upper sternal border. There is no gallop or diastolic murmur. Abdomen is soft and nontender without masses or bruits. He has 1+ ankle edema. He is alert and oriented x3. Cranial nerves II through XII are intact. Skin is warm and dry.  LABORATORY DATA: ECG demonstrates sinus bradycardia with a left anterior fascicular block. There is evidence of old anteroseptal infarction.  Assessment / Plan:

## 2010-11-29 NOTE — Assessment & Plan Note (Signed)
He is having no significant anginal symptoms. Continue with his activity and his current medications.

## 2010-11-29 NOTE — Assessment & Plan Note (Signed)
It has been 2 years since his last evaluation. We will repeat an echocardiogram to reassess his aortic stenosis and to reassess his LV function.

## 2010-11-29 NOTE — Assessment & Plan Note (Signed)
Prior ejection fraction of 40%. He is not a good candidate for beta blocker therapy because of bradycardia. He is on ACE inhibitors. He is euvolemic. We'll reassess his LV function by echo.

## 2010-11-29 NOTE — Patient Instructions (Signed)
We will schedule you for an echocardiogram  Continue your current medication.  Stay active.  I will see you again in 6 months.

## 2010-12-07 ENCOUNTER — Ambulatory Visit (HOSPITAL_COMMUNITY): Payer: Medicare Other | Attending: Cardiology | Admitting: Radiology

## 2010-12-07 DIAGNOSIS — I350 Nonrheumatic aortic (valve) stenosis: Secondary | ICD-10-CM

## 2010-12-07 DIAGNOSIS — E669 Obesity, unspecified: Secondary | ICD-10-CM | POA: Insufficient documentation

## 2010-12-07 DIAGNOSIS — I10 Essential (primary) hypertension: Secondary | ICD-10-CM | POA: Insufficient documentation

## 2010-12-07 DIAGNOSIS — I2589 Other forms of chronic ischemic heart disease: Secondary | ICD-10-CM | POA: Insufficient documentation

## 2010-12-07 DIAGNOSIS — I252 Old myocardial infarction: Secondary | ICD-10-CM | POA: Insufficient documentation

## 2010-12-07 DIAGNOSIS — Z87891 Personal history of nicotine dependence: Secondary | ICD-10-CM | POA: Insufficient documentation

## 2010-12-07 DIAGNOSIS — I2510 Atherosclerotic heart disease of native coronary artery without angina pectoris: Secondary | ICD-10-CM | POA: Insufficient documentation

## 2010-12-07 DIAGNOSIS — N289 Disorder of kidney and ureter, unspecified: Secondary | ICD-10-CM | POA: Insufficient documentation

## 2010-12-07 DIAGNOSIS — I359 Nonrheumatic aortic valve disorder, unspecified: Secondary | ICD-10-CM | POA: Insufficient documentation

## 2010-12-08 ENCOUNTER — Telehealth: Payer: Self-pay | Admitting: Cardiology

## 2010-12-08 NOTE — Telephone Encounter (Signed)
New message  Pt called about echo results he had yesterday Please call him back

## 2010-12-08 NOTE — Telephone Encounter (Signed)
Called requesting echo results. Lm that Dr. [place] is out of town and will call him next week when Dr. [place] has reviewed.

## 2010-12-12 ENCOUNTER — Telehealth: Payer: Self-pay | Admitting: *Deleted

## 2010-12-12 NOTE — Telephone Encounter (Signed)
Notified of Echo results. Will send copy to Dr. Collins [place].

## 2011-04-08 ENCOUNTER — Ambulatory Visit (INDEPENDENT_AMBULATORY_CARE_PROVIDER_SITE_OTHER): Payer: Medicare Other | Admitting: Emergency Medicine

## 2011-04-08 ENCOUNTER — Ambulatory Visit: Payer: Medicare Other

## 2011-04-08 VITALS — BP 119/70 | HR 58 | Temp 97.7°F | Resp 20 | Ht 70.0 in | Wt 212.2 lb

## 2011-04-08 DIAGNOSIS — J029 Acute pharyngitis, unspecified: Secondary | ICD-10-CM

## 2011-04-08 DIAGNOSIS — R059 Cough, unspecified: Secondary | ICD-10-CM

## 2011-04-08 DIAGNOSIS — R05 Cough: Secondary | ICD-10-CM

## 2011-04-08 MED ORDER — BENZONATATE 100 MG PO CAPS: ORAL_CAPSULE | ORAL | Status: DC

## 2011-04-08 MED ORDER — MAGIC MOUTHWASH W/LIDOCAINE: ORAL | Status: DC

## 2011-04-08 NOTE — Progress Notes (Signed)
  Subjective:    Patient ID: Jonathan Acevedo, male    DOB: March 02, 1928, 78 y.o.   MRN: [id number]  HPI patient enters with onset early morning of a bad cough. He used his wife's Magic mouthwash and did some relief. He denies fever or chills or any other symptoms at the present time.    Review of Systems patient had a flu shot this year. He denies headache fever myalgias or other symptoms. He has heart disease related to an MI in the 90s. He has significant aortic stenosis and is followed by a cardiologist for this.     Objective:   Physical Exam  Constitutional: He appears well-nourished.  HENT:  Head: Normocephalic.  Left Ear: External ear normal.  Eyes: Pupils are equal, round, and reactive to light.  Neck: No JVD present. No tracheal deviation present. No thyromegaly present.  Cardiovascular: Regular rhythm.   Murmur heard.      He has a loud 3/6 blowing systolic murmur at the base of the heart which radiates into the neck.  Pulmonary/Chest: Breath sounds normal. No respiratory distress. He has no wheezes. He has no rales. He exhibits no tenderness.          Assessment & Plan:

## 2011-05-23 NOTE — Telephone Encounter (Signed)
ENCOUNTER COMPLETED 

## 2011-06-01 ENCOUNTER — Encounter: Payer: Self-pay | Admitting: Cardiology

## 2011-06-01 DIAGNOSIS — N400 Enlarged prostate without lower urinary tract symptoms: Secondary | ICD-10-CM | POA: Insufficient documentation

## 2011-06-01 DIAGNOSIS — I359 Nonrheumatic aortic valve disorder, unspecified: Secondary | ICD-10-CM | POA: Insufficient documentation

## 2011-06-02 ENCOUNTER — Encounter: Payer: Self-pay | Admitting: Cardiology

## 2011-06-29 ENCOUNTER — Ambulatory Visit (INDEPENDENT_AMBULATORY_CARE_PROVIDER_SITE_OTHER): Payer: Medicare Other | Admitting: Cardiology

## 2011-06-29 ENCOUNTER — Encounter: Payer: Self-pay | Admitting: Cardiology

## 2011-06-29 VITALS — BP 144/85 | HR 65 | Ht 71.0 in | Wt 214.0 lb

## 2011-06-29 DIAGNOSIS — I359 Nonrheumatic aortic valve disorder, unspecified: Secondary | ICD-10-CM

## 2011-06-29 DIAGNOSIS — I350 Nonrheumatic aortic (valve) stenosis: Secondary | ICD-10-CM

## 2011-06-29 DIAGNOSIS — E210 Primary hyperparathyroidism: Secondary | ICD-10-CM

## 2011-06-29 DIAGNOSIS — I2510 Atherosclerotic heart disease of native coronary artery without angina pectoris: Secondary | ICD-10-CM

## 2011-06-29 DIAGNOSIS — I509 Heart failure, unspecified: Secondary | ICD-10-CM

## 2011-06-29 DIAGNOSIS — E7800 Pure hypercholesterolemia, unspecified: Secondary | ICD-10-CM

## 2011-06-29 DIAGNOSIS — I5022 Chronic systolic (congestive) heart failure: Secondary | ICD-10-CM

## 2011-06-29 NOTE — Patient Instructions (Signed)
Continue your current medication.  I will see you again in 6 months.   

## 2011-07-03 DIAGNOSIS — I5022 Chronic systolic (congestive) heart failure: Secondary | ICD-10-CM | POA: Insufficient documentation

## 2011-07-03 DIAGNOSIS — E210 Primary hyperparathyroidism: Secondary | ICD-10-CM | POA: Insufficient documentation

## 2011-07-03 NOTE — Assessment & Plan Note (Signed)
Lipids are at goal on his current medical therapy.

## 2011-07-03 NOTE — Assessment & Plan Note (Signed)
We will continue his current medical therapy including aspirin and ACE inhibitor. He is not a candidate for beta blocker because of a history of bradycardia. Continue Vytorin. I am encouraged with his exercise program. We will need to consider stress test within the next year. His last exercise stress test was in July of 2010. He did have some abnormality at that time but cardiac catheterization demonstrated only nonobstructive disease. His exercise tolerance is stable.

## 2011-07-03 NOTE — Assessment & Plan Note (Signed)
He does have chronic edema. He thinks that this is unchanged. We discussed diuretic therapy but he does not want to take it at this time. I stressed the importance of sodium restriction, elevation of his feet, and using support hose.

## 2011-07-03 NOTE — Assessment & Plan Note (Signed)
Echocardiogram in October 2012 showed moderate aortic stenosis with a mean gradient of 21 mm of mercury and peak gradient of 38 mm of mercury. We will need to follow this on a yearly basis.

## 2011-07-03 NOTE — Assessment & Plan Note (Signed)
Calcium levels have been chronically elevated. He has been offered surgery in the past and refused.

## 2011-07-03 NOTE — Progress Notes (Signed)
   Jonathan Acevedo Date of Birth: 05/04/1928   History of Present Illness: Jonathan Acevedo is seen for followup today. He states he has been doing well. He is trying to lose weight and has joined a gym. He is doing more swimming now and less running. His exercise program includes some weight lifting. With his exercise program he has been able to get up to the equivalent of a stage III Bruce protocol. This is consistent with his last stress test in 2010. He has noted some lower extremity edema. He denies any orthopnea, dyspnea, or chest pain.  Current Outpatient Prescriptions on File Prior to Visit  Medication Sig Dispense Refill  . aspirin 325 MG EC tablet Take 325 mg by mouth daily.        . benzonatate (TESSALON) 100 MG capsule Take 1-2 capsules 3 times a day as needed for cough.  20 capsule  0  . enalapril (VASOTEC) 20 MG tablet Take 20 mg by mouth daily.        Marland Kitchen ezetimibe-simvastatin (VYTORIN) 10-20 MG per tablet Take 1 tablet by mouth at bedtime.  90 tablet  3    Allergies  Allergen Reactions  . Risedronate Sodium     Past Medical History  Diagnosis Date  . Myocardial infarction 1998  . Coronary artery disease     w remote anterior myocardial infarction  . Hypertension   . Renal insufficiency   . Aortic stenosis   . Rectal polyp   . Hypercholesteremia     Past Surgical History  Procedure Date  . Cardiac catheterization 08/18/2008  . Umbilical hernia repair   . Inguinal hernia repair     left  . Orif femur fracture     right  . Cataract extraction     History  Smoking status  . Former Smoker -- 1.0 packs/day for 45 years  . Types: Cigarettes  . Quit date: 07/19/1996  Smokeless tobacco  . Never Used    History  Alcohol Use No    History reviewed. No pertinent family history.  Review of Systems: The review of systems is positive for chronic pedal edema with some stasis dermatitis.  All other systems were reviewed and are negative.  Physical Exam: BP 144/85   Pulse 65  Ht 5\' 11"  (1.803 m)  Wt 214 lb (97.07 kg)  BMI 29.85 kg/m2 He is a pleasant white male in no acute distress. HEENT exam is unremarkable. He has no JVD, adenopathy, thyromegaly, or bruits. Lungs are clear. Cardiac exam reveals a regular rate and rhythm with a harsh grade 2/6 systolic murmur heard best at the right upper sternal border. There is no gallop or diastolic murmur. Abdomen is soft and nontender without masses or bruits. He has 1-2+ ankle edema. He is alert and oriented x3. Cranial nerves II through XII are intact. Skin is warm and dry.  LABORATORY DATA: Reviewed from 06/01/2011 BUN 28, creatinine 1.52. Calcium 11.1. Total cholesterol 145, HDL 68, LDL 55, triglycerides 110. CBC was normal.   Assessment / Plan:

## 2011-07-21 DIAGNOSIS — M810 Age-related osteoporosis without current pathological fracture: Secondary | ICD-10-CM | POA: Insufficient documentation

## 2011-07-21 DIAGNOSIS — E213 Hyperparathyroidism, unspecified: Secondary | ICD-10-CM | POA: Insufficient documentation

## 2011-11-01 ENCOUNTER — Encounter (HOSPITAL_COMMUNITY): Payer: Self-pay | Admitting: Emergency Medicine

## 2011-11-01 ENCOUNTER — Emergency Department (HOSPITAL_COMMUNITY)
Admission: EM | Admit: 2011-11-01 | Discharge: 2011-11-01 | Disposition: A | Discharge status: Home / Self Care | Payer: Medicare Other | Attending: Emergency Medicine | Admitting: Emergency Medicine

## 2011-11-01 ENCOUNTER — Emergency Department (HOSPITAL_COMMUNITY): Payer: Medicare Other

## 2011-11-01 DIAGNOSIS — I2510 Atherosclerotic heart disease of native coronary artery without angina pectoris: Secondary | ICD-10-CM | POA: Insufficient documentation

## 2011-11-01 DIAGNOSIS — I10 Essential (primary) hypertension: Secondary | ICD-10-CM | POA: Insufficient documentation

## 2011-11-01 DIAGNOSIS — R109 Unspecified abdominal pain: Secondary | ICD-10-CM

## 2011-11-01 DIAGNOSIS — N200 Calculus of kidney: Secondary | ICD-10-CM | POA: Insufficient documentation

## 2011-11-01 DIAGNOSIS — I252 Old myocardial infarction: Secondary | ICD-10-CM | POA: Insufficient documentation

## 2011-11-01 DIAGNOSIS — Z7982 Long term (current) use of aspirin: Secondary | ICD-10-CM | POA: Insufficient documentation

## 2011-11-01 HISTORY — DX: Calculus of kidney: N20.0

## 2011-11-01 LAB — CBC WITH DIFFERENTIAL/PLATELET
Eosinophils Absolute: 0.1 10*3/uL (ref 0.0–0.7)
Hemoglobin: 13.1 g/dL (ref 13.0–17.0)
Lymphocytes Relative: 30 % (ref 12–46)
Lymphs Abs: 1.9 10*3/uL (ref 0.7–4.0)
MCH: 31 pg (ref 26.0–34.0)
Monocytes Relative: 11 % (ref 3–12)
Neutrophils Relative %: 58 % (ref 43–77)
RBC: 4.23 MIL/uL (ref 4.22–5.81)

## 2011-11-01 LAB — URINALYSIS, ROUTINE W REFLEX MICROSCOPIC
Bilirubin Urine: NEGATIVE
Hgb urine dipstick: NEGATIVE
Nitrite: NEGATIVE
Specific Gravity, Urine: 1.025 (ref 1.005–1.030)
pH: 5 (ref 5.0–8.0)

## 2011-11-01 LAB — BASIC METABOLIC PANEL
BUN: 27 mg/dL — ABNORMAL HIGH (ref 6–23)
CO2: 24 mEq/L (ref 19–32)
Chloride: 107 mEq/L (ref 96–112)
GFR calc non Af Amer: 44 mL/min — ABNORMAL LOW (ref >90)
Glucose, Bld: 98 mg/dL (ref 70–99)
Potassium: 4.4 mEq/L (ref 3.5–5.1)

## 2011-11-01 MED ORDER — HYDROCODONE-ACETAMINOPHEN 5-325 MG PO TABS: 1.0000 | ORAL_TABLET | Freq: Four times a day (QID) | ORAL | Status: DC | PRN

## 2011-11-01 MED ORDER — HYDROMORPHONE HCL PF 1 MG/ML IJ SOLN
1.0000 mg | Freq: Once | INTRAMUSCULAR | Status: AC
  Administered 2011-11-01: 1 mg via INTRAVENOUS
  Filled 2011-11-01: qty 1

## 2011-11-01 MED ORDER — HYDROMORPHONE HCL PF 1 MG/ML IJ SOLN
0.5000 mg | Freq: Once | INTRAMUSCULAR | Status: AC
  Administered 2011-11-01: 0.5 mg via INTRAVENOUS
  Filled 2011-11-01: qty 1

## 2011-11-01 NOTE — ED Notes (Signed)
Patient made aware a urine sample is needed.  

## 2011-11-01 NOTE — ED Notes (Signed)
Patient attempting to void 

## 2011-11-01 NOTE — ED Provider Notes (Signed)
History     CSN: [id number]  Arrival date & time 11/01/11  1004   First MD Initiated Contact with Patient 11/01/11 1110      Chief Complaint  Patient presents with  . Flank Pain    (Consider location/radiation/quality/duration/timing/severity/associated sxs/prior treatment) HPI  78 year old male with history of CAD, renal insufficiency, and prior history of kidney stone present complaining of R flank pain.  Rash onset, sharp and stabbing, nonradiating, and persistent. Nothing aggravates or alleviated. Patient tried over-the-counter medication at home without relief.  The current pain felt similar to kidney stone pain that he had 30 years ago. He denies fever, chills, n/v/d, Chest pain, shortness of breath, abdominal pain, urinary symptoms. He denies any recent trauma, or heavy lifting.  Has not noticed any blood in urine or trouble urinating.    Past Medical History  Diagnosis Date  . Myocardial infarction 1998  . Coronary artery disease     w remote anterior myocardial infarction  . Hypertension   . Renal insufficiency   . Aortic stenosis   . Rectal polyp   . Hypercholesteremia   . Kidney stones     Past Surgical History  Procedure Date  . Cardiac catheterization 08/18/2008  . Umbilical hernia repair   . Inguinal hernia repair     left  . Orif femur fracture     right  . Cataract extraction     No family history on file.  History  Substance Use Topics  . Smoking status: Former Smoker -- 1.0 packs/day for 45 years    Types: Cigarettes    Quit date: 07/19/1996  . Smokeless tobacco: Never Used  . Alcohol Use: No      Review of Systems  All other systems reviewed and are negative.    Allergies  Risedronate sodium  Home Medications   Current Outpatient Rx  Name Route Sig Dispense Refill  . ASPIRIN 325 MG PO TBEC Oral Take 325 mg by mouth daily.      Marland Kitchen BENZONATATE 100 MG PO CAPS  Take 1-2 capsules 3 times a day as needed for cough. 20 capsule 0  .  ENALAPRIL MALEATE 20 MG PO TABS Oral Take 20 mg by mouth daily.      Marland Kitchen EZETIMIBE-SIMVASTATIN 10-20 MG PO TABS Oral Take 1 tablet by mouth at bedtime. 90 tablet 3  . TAMSULOSIN HCL 0.4 MG PO CAPS Oral Take 0.4 mg by mouth as directed.      BP 148/110  Pulse 66  Temp 97.6 F (36.4 C) (Oral)  Resp 18  SpO2 97%  Physical Exam  Nursing note and vitals reviewed. Constitutional: He appears well-developed and well-nourished. No distress.       Awake, alert, nontoxic appearance.  Healthy looking.   HENT:  Head: Atraumatic.  Eyes: Conjunctivae normal are normal. Right eye exhibits no discharge. Left eye exhibits no discharge.  Neck: Normal range of motion. Neck supple.  Cardiovascular: Normal rate and regular rhythm.   Pulmonary/Chest: Effort normal. No respiratory distress. He exhibits no tenderness.  Abdominal: Soft. There is no tenderness. There is CVA tenderness. There is no rebound.       R CVA tenderness.  No overlying skin changes.  No reproducible pain with hip flexion/extension.   Musculoskeletal: He exhibits no tenderness.       ROM appears intact, no obvious focal weakness  Neurological: He is alert.  Skin: Skin is warm and dry. No rash noted.  Psychiatric: He has a normal mood and  affect.    ED Course  Procedures (including critical care time)   Labs Reviewed  URINALYSIS, ROUTINE W REFLEX MICROSCOPIC  CBC WITH DIFFERENTIAL  BASIC METABOLIC PANEL   Results for orders placed during the hospital encounter of 11/01/11  URINALYSIS, ROUTINE W REFLEX MICROSCOPIC      Component Value Range   Color, Urine YELLOW  YELLOW   APPearance CLEAR  CLEAR   Specific Gravity, Urine 1.025  1.005 - 1.030   pH 5.0  5.0 - 8.0   Glucose, UA NEGATIVE  NEGATIVE mg/dL   Hgb urine dipstick NEGATIVE  NEGATIVE   Bilirubin Urine NEGATIVE  NEGATIVE   Ketones, ur NEGATIVE  NEGATIVE mg/dL   Protein, ur NEGATIVE  NEGATIVE mg/dL   Urobilinogen, UA 0.2  0.0 - 1.0 mg/dL   Nitrite NEGATIVE  NEGATIVE     Leukocytes, UA NEGATIVE  NEGATIVE  CBC WITH DIFFERENTIAL      Component Value Range   WBC 6.6  4.0 - 10.5 K/uL   RBC 4.23  4.22 - 5.81 MIL/uL   Hemoglobin 13.1  13.0 - 17.0 g/dL   HCT 40.9 (*) 81.1 - 91.4 %   MCV 91.7  78.0 - 100.0 fL   MCH 31.0  26.0 - 34.0 pg   MCHC 33.8  30.0 - 36.0 g/dL   RDW 78.2  95.6 - 21.3 %   Platelets 190  150 - 400 K/uL   Neutrophils Relative 58  43 - 77 %   Neutro Abs 3.8  1.7 - 7.7 K/uL   Lymphocytes Relative 30  12 - 46 %   Lymphs Abs 1.9  0.7 - 4.0 K/uL   Monocytes Relative 11  3 - 12 %   Monocytes Absolute 0.7  0.1 - 1.0 K/uL   Eosinophils Relative 2  0 - 5 %   Eosinophils Absolute 0.1  0.0 - 0.7 K/uL   Basophils Relative 0  0 - 1 %   Basophils Absolute 0.0  0.0 - 0.1 K/uL  BASIC METABOLIC PANEL      Component Value Range   Sodium 141  135 - 145 mEq/L   Potassium 4.4  3.5 - 5.1 mEq/L   Chloride 107  96 - 112 mEq/L   CO2 24  19 - 32 mEq/L   Glucose, Bld 98  70 - 99 mg/dL   BUN 27 (*) 6 - 23 mg/dL   Creatinine, Ser 0.86 (*) 0.50 - 1.35 mg/dL   Calcium 57.8 (*) 8.4 - 10.5 mg/dL   GFR calc non Af Amer 44 (*) >90 mL/min   GFR calc Af Amer 51 (*) >90 mL/min   Ct Abdomen Pelvis Wo Contrast  11/01/2011  *RADIOLOGY REPORT*  Clinical Data: Right flank pain.  CT ABDOMEN AND PELVIS WITHOUT CONTRAST  Technique:  Multidetector CT imaging of the abdomen and pelvis was performed following the standard protocol without intravenous contrast.  Comparison: Renal ultrasound 08/17/2008  Findings: Prior CABG and valve replacement.  Lung bases are clear. No effusions.  Multiple cystic areas are noted within both kidneys and in the liver.  There cannot be fully characterize without IV contrast but most likely represent small cysts.  Bilateral renal cysts noted on prior renal ultrasound.  Small bilateral nonobstructing renal stones.  No hydronephrosis. No ureteral stones.  Urinary bladder grossly unremarkable.  Central prostate calcifications present.  Extensive  descending colonic and sigmoid diverticula.  No active diverticulitis.  Appendix is visualized and is normal.  Stomach and small bowel are decompressed,  grossly unremarkable.  Aorta and iliac vessels are tortuous and heavily calcified, non- aneurysmal.  No acute bony abnormality.  Degenerative disc disease and scoliosis throughout the lumbar spine.  IMPRESSION: Bilateral nephrolithiasis.  No ureteral stones or hydronephrosis.  Multiple hepatic and renal cystic lesions, presumably benign cysts although there cannot be fully characterized without IV contrast.   Original Report Authenticated By: Cyndie Chime, M.D.     1.  R flank pain   MDM  R flank pain, similar to prior kidney stone.  Doubt musculoskeletal.  Doubt cardiopulmonary etiology.  Work up initiated.  Pain medication given.     Pt's UA is unremarkable.  His renal function is elevated but not as high as previous value.  Due to his age will obtain abd/pelvic CT without contrast.  2:53 PM Pt felt better, currently denies any pain except a vague discomfort sensation to R lower back.  No CVA tenderness, no abd pain on exam.  CT scan shows no acute finding, specifically no renal stone or hydronephrosis.  Pt does have a PCP.  My attending has evaluated the pt and felt he is stable to be discharge.  Pt will f/u with his PCP for further care.    BP 159/80  Pulse 61  Temp 97.6 F (36.4 C) (Oral)  Resp 18  SpO2 93%  Nursing notes reviewed and considered in documentation  Previous records reviewed and considered  All labs/vitals reviewed and considered  xrays reviewed and considered      Fayrene Helper, PA-C 11/01/11 1623

## 2011-11-01 NOTE — ED Provider Notes (Signed)
Medical screening examination/treatment/procedure(s) were conducted as a shared visit with non-physician practitioner(s) and myself.  I personally evaluated the patient during the encounter  Pt without acute findings on CT scan.  Symptoms possibly muscular in nature.  Will dc home with pain medications.  Celene Kras, MD 11/01/11 [phone]

## 2011-11-01 NOTE — ED Notes (Signed)
Pt c/o rt sided flank pain since last night.  Denies difficulty urinating.  Hx of kidney stones and states that he "knows that's what it is".

## 2011-11-08 ENCOUNTER — Other Ambulatory Visit: Payer: Self-pay | Admitting: Cardiology

## 2011-11-08 ENCOUNTER — Telehealth: Payer: Self-pay | Admitting: Cardiology

## 2011-11-08 MED ORDER — ENALAPRIL MALEATE 20 MG PO TABS: 20.0000 mg | ORAL_TABLET | Freq: Every day | ORAL | Status: DC

## 2011-11-08 MED ORDER — EZETIMIBE-SIMVASTATIN 10-20 MG PO TABS: 1.0000 | ORAL_TABLET | Freq: Every day | ORAL | Status: DC

## 2011-11-08 NOTE — Telephone Encounter (Signed)
Patient called stated he needs surgical clearance for parathyroid surgery with Dr.Jennifer Lady Gary at [facility].States he has appointment with her 11/23/11.Also needs 90 day prescriptions mailed to him.

## 2011-11-08 NOTE — Telephone Encounter (Signed)
Patient called was told spoke with Dr.Jordan he advised ok to have parathyroid surgery.Letter of surgical clearance mailed to patient .Also prescriptions for vytorin 10/20 mg #90 refills x 3 and enalapril 20 mg #90 refills x 3 mailed to patient.

## 2011-11-08 NOTE — Telephone Encounter (Signed)
Pt wants to know if we received any information regarding him having a surgery at [facility]

## 2011-11-09 ENCOUNTER — Other Ambulatory Visit: Payer: Self-pay | Admitting: Cardiology

## 2011-11-09 MED ORDER — ENALAPRIL MALEATE 20 MG PO TABS: 20.0000 mg | ORAL_TABLET | Freq: Every day | ORAL | Status: DC

## 2011-12-12 ENCOUNTER — Telehealth: Payer: Self-pay

## 2011-12-12 NOTE — Telephone Encounter (Signed)
Received fax for medical clearance from Dr.Shawn [facility] for lumbar epidural steroid injections.Dr.Jordan cleared patient for injection, form was signed and faxed back to fax # [phone].

## 2011-12-26 ENCOUNTER — Encounter: Payer: Self-pay | Admitting: Cardiology

## 2011-12-26 ENCOUNTER — Ambulatory Visit (INDEPENDENT_AMBULATORY_CARE_PROVIDER_SITE_OTHER): Payer: Medicare Other | Admitting: Cardiology

## 2011-12-26 ENCOUNTER — Other Ambulatory Visit: Payer: Self-pay | Admitting: *Deleted

## 2011-12-26 VITALS — BP 146/84 | HR 54 | Ht 71.0 in | Wt 209.0 lb

## 2011-12-26 DIAGNOSIS — I10 Essential (primary) hypertension: Secondary | ICD-10-CM

## 2011-12-26 DIAGNOSIS — I359 Nonrheumatic aortic valve disorder, unspecified: Secondary | ICD-10-CM

## 2011-12-26 DIAGNOSIS — I5022 Chronic systolic (congestive) heart failure: Secondary | ICD-10-CM

## 2011-12-26 DIAGNOSIS — I509 Heart failure, unspecified: Secondary | ICD-10-CM

## 2011-12-26 DIAGNOSIS — D351 Benign neoplasm of parathyroid gland: Secondary | ICD-10-CM | POA: Insufficient documentation

## 2011-12-26 DIAGNOSIS — I350 Nonrheumatic aortic (valve) stenosis: Secondary | ICD-10-CM

## 2011-12-26 DIAGNOSIS — I2510 Atherosclerotic heart disease of native coronary artery without angina pectoris: Secondary | ICD-10-CM

## 2011-12-26 HISTORY — DX: Hypercalcemia: E83.52

## 2011-12-26 HISTORY — DX: Benign neoplasm of parathyroid gland: D35.1

## 2011-12-26 MED ORDER — EZETIMIBE-SIMVASTATIN 10-20 MG PO TABS: 1.0000 | ORAL_TABLET | Freq: Every day | ORAL | Status: DC

## 2011-12-26 NOTE — Progress Notes (Signed)
Jonathan Acevedo Date of Birth: 06/27/1928   History of Present Illness: Jonathan Acevedo is seen for followup today. He reports he is doing very well from a cardiac standpoint. He is exercising mainly by swimming now. He did note some increased fatigue and shortness of breath but after he got an injection in his back he was able to do much more. He is planning to have parathyroid surgery on December 5 at [facility]. He has a history of hyperparathyroidism and I believe has a parathyroid adenoma. He states he is also going to need a lens implant in his left eye and is due for followup colonoscopy. He was seen in emergency room in September with complaints of flank pain. His workup at that time was negative. He otherwise denies any chest pain or shortness of breath.  Current Outpatient Prescriptions on File Prior to Visit  Medication Sig Dispense Refill  . aspirin 325 MG EC tablet Take 325 mg by mouth daily.        . enalapril (VASOTEC) 20 MG tablet Take 1 tablet (20 mg total) by mouth daily.  90 tablet  3  . ezetimibe-simvastatin (VYTORIN) 10-20 MG per tablet Take 1 tablet by mouth at bedtime.  90 tablet  3  . HYDROcodone-acetaminophen (NORCO) 5-325 MG per tablet Take 1-2 tablets by mouth every 6 (six) hours as needed for pain.  20 tablet  0  . Tamsulosin HCl (FLOMAX) 0.4 MG CAPS Take 0.4 mg by mouth as directed.        Allergies  Allergen Reactions  . Risedronate Sodium Other (See Comments)    Reaction=unknown    Past Medical History  Diagnosis Date  . Myocardial infarction 1998  . Coronary artery disease     w remote anterior myocardial infarction  . Hypertension   . Renal insufficiency   . Aortic stenosis   . Rectal polyp   . Hypercholesteremia   . Kidney stones   . 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 Hypercalcemia 12/26/2011    Past Surgical History  Procedure Date  . Cardiac catheterization 08/18/2008  . Umbilical hernia repair   . Inguinal hernia repair     left  . Orif femur  fracture     right  . Cataract extraction     History  Smoking status  . Former Smoker -- 1.0 packs/day for 45 years  . Types: Cigarettes  . Quit date: 07/19/1996  Smokeless tobacco  . Never Used    History  Alcohol Use No    History reviewed. No pertinent family history.  Review of Systems: As noted in history of present illness. All other systems were reviewed and are negative.  Physical Exam: BP 146/84  Pulse 54  Ht 5\' 11"  (1.803 m)  Wt 94.802 kg (209 lb)  BMI 29.15 kg/m2 He is a pleasant white male in no acute distress. HEENT exam is unremarkable. He has no JVD, adenopathy, thyromegaly, or bruits. Lungs are clear. Cardiac exam reveals a regular rate and rhythm with a harsh grade 2/6 systolic murmur heard best at the right upper sternal border. There is no gallop or diastolic murmur. Abdomen is soft and nontender without masses or bruits. He has 1+ ankle edema. He is alert and oriented x3. Cranial nerves II through XII are intact. Skin is warm and dry.  LABORATORY DATA: Laboratory data from emergency room in September was reviewed. ECG today demonstrates sinus bradycardia with a rate of 49 beats per minute. There is evidence of an  old anteroseptal infarct with left axis deviation. This is unchanged from one year ago.  Assessment / Plan: 1. Coronary disease with remote anterior myocardial infarction. Patient is asymptomatic. He had a cardiac catheterization 2010 which showed nonobstructive disease.  2. Chronic systolic congestive heart failure. Ejection fraction 35-40%. He appears to be well compensated on his current medications.  3. Moderate aortic stenosis. His last echocardiogram was in October 2012. Plan on repeating an echocardiogram in 6 months.  4. Hypercholesterolemia.  5. Hyperparathyroidism. Patient is cleared for surgery from a cardiac standpoint. I'll followup again in 6 months.

## 2011-12-26 NOTE — Patient Instructions (Signed)
Continue your current therapy  I will see you again in 6 months.   

## 2012-01-03 DIAGNOSIS — I10 Essential (primary) hypertension: Secondary | ICD-10-CM | POA: Insufficient documentation

## 2012-01-03 DIAGNOSIS — Z720 Tobacco use: Secondary | ICD-10-CM | POA: Insufficient documentation

## 2012-03-23 ENCOUNTER — Other Ambulatory Visit: Payer: Self-pay

## 2012-03-25 DIAGNOSIS — E890 Postprocedural hypothyroidism: Secondary | ICD-10-CM | POA: Insufficient documentation

## 2012-03-29 DIAGNOSIS — D509 Iron deficiency anemia, unspecified: Secondary | ICD-10-CM | POA: Insufficient documentation

## 2012-04-19 ENCOUNTER — Encounter: Payer: Self-pay | Admitting: Cardiology

## 2012-05-03 ENCOUNTER — Encounter (HOSPITAL_COMMUNITY): Payer: Self-pay | Admitting: Unknown Physician Specialty

## 2012-05-03 ENCOUNTER — Emergency Department (HOSPITAL_COMMUNITY): Payer: Medicare Other

## 2012-05-03 ENCOUNTER — Observation Stay (HOSPITAL_COMMUNITY)
Admission: EM | Admit: 2012-05-03 | Discharge: 2012-05-04 | Disposition: A | Discharge status: Home / Self Care | Payer: Medicare Other | Attending: Cardiology | Admitting: Cardiology

## 2012-05-03 DIAGNOSIS — I2589 Other forms of chronic ischemic heart disease: Secondary | ICD-10-CM | POA: Insufficient documentation

## 2012-05-03 DIAGNOSIS — I350 Nonrheumatic aortic (valve) stenosis: Secondary | ICD-10-CM

## 2012-05-03 DIAGNOSIS — I10 Essential (primary) hypertension: Secondary | ICD-10-CM | POA: Insufficient documentation

## 2012-05-03 DIAGNOSIS — E7800 Pure hypercholesterolemia, unspecified: Secondary | ICD-10-CM

## 2012-05-03 DIAGNOSIS — I2510 Atherosclerotic heart disease of native coronary artery without angina pectoris: Secondary | ICD-10-CM | POA: Insufficient documentation

## 2012-05-03 DIAGNOSIS — R0789 Other chest pain: Secondary | ICD-10-CM

## 2012-05-03 DIAGNOSIS — R072 Precordial pain: Principal | ICD-10-CM | POA: Insufficient documentation

## 2012-05-03 DIAGNOSIS — I359 Nonrheumatic aortic valve disorder, unspecified: Secondary | ICD-10-CM | POA: Insufficient documentation

## 2012-05-03 DIAGNOSIS — N289 Disorder of kidney and ureter, unspecified: Secondary | ICD-10-CM | POA: Insufficient documentation

## 2012-05-03 DIAGNOSIS — R079 Chest pain, unspecified: Secondary | ICD-10-CM

## 2012-05-03 DIAGNOSIS — I509 Heart failure, unspecified: Secondary | ICD-10-CM | POA: Insufficient documentation

## 2012-05-03 LAB — CBC WITH DIFFERENTIAL/PLATELET
Basophils Relative: 0 % (ref 0–1)
Eosinophils Absolute: 0.1 10*3/uL (ref 0.0–0.7)
Eosinophils Relative: 2 % (ref 0–5)
HCT: 34.9 % — ABNORMAL LOW (ref 39.0–52.0)
Hemoglobin: 11.9 g/dL — ABNORMAL LOW (ref 13.0–17.0)
MCH: 30.7 pg (ref 26.0–34.0)
MCHC: 34.1 g/dL (ref 30.0–36.0)
MCV: 90.2 fL (ref 78.0–100.0)
Monocytes Absolute: 0.5 10*3/uL (ref 0.1–1.0)
Monocytes Relative: 8 % (ref 3–12)

## 2012-05-03 LAB — BASIC METABOLIC PANEL
BUN: 31 mg/dL — ABNORMAL HIGH (ref 6–23)
Creatinine, Ser: 1.7 mg/dL — ABNORMAL HIGH (ref 0.50–1.35)
GFR calc Af Amer: 41 mL/min — ABNORMAL LOW (ref >90)
GFR calc non Af Amer: 36 mL/min — ABNORMAL LOW (ref >90)
Glucose, Bld: 104 mg/dL — ABNORMAL HIGH (ref 70–99)

## 2012-05-03 MED ORDER — ENALAPRIL MALEATE 20 MG PO TABS
20.0000 mg | ORAL_TABLET | Freq: Every day | ORAL | Status: DC
  Administered 2012-05-04: 20 mg via ORAL
  Filled 2012-05-03: qty 1

## 2012-05-03 MED ORDER — NITROGLYCERIN 0.4 MG SL SUBL: 0.4000 mg | SUBLINGUAL_TABLET | SUBLINGUAL | Status: DC | PRN

## 2012-05-03 MED ORDER — SODIUM CHLORIDE 0.9 % IV SOLN: Freq: Once | INTRAVENOUS | Status: DC

## 2012-05-03 MED ORDER — ASPIRIN EC 325 MG PO TBEC
325.0000 mg | DELAYED_RELEASE_TABLET | Freq: Every day | ORAL | Status: DC
  Administered 2012-05-04: 325 mg via ORAL
  Filled 2012-05-03: qty 1

## 2012-05-03 MED ORDER — ONDANSETRON HCL 4 MG/2ML IJ SOLN: 4.0000 mg | Freq: Three times a day (TID) | INTRAMUSCULAR | Status: AC | PRN

## 2012-05-03 MED ORDER — TAMSULOSIN HCL 0.4 MG PO CAPS
0.4000 mg | ORAL_CAPSULE | Freq: Every day | ORAL | Status: DC
  Administered 2012-05-04: 0.4 mg via ORAL
  Filled 2012-05-03: qty 1

## 2012-05-03 MED ORDER — ASPIRIN 81 MG PO CHEW: 162.0000 mg | CHEWABLE_TABLET | Freq: Once | ORAL | Status: DC

## 2012-05-03 MED ORDER — HYDROCODONE-ACETAMINOPHEN 5-325 MG PO TABS: 1.0000 | ORAL_TABLET | Freq: Four times a day (QID) | ORAL | Status: DC | PRN

## 2012-05-03 MED ORDER — SODIUM CHLORIDE 0.9 % IJ SOLN
3.0000 mL | Freq: Two times a day (BID) | INTRAMUSCULAR | Status: DC
  Administered 2012-05-03: 3 mL via INTRAVENOUS

## 2012-05-03 MED ORDER — EZETIMIBE-SIMVASTATIN 10-20 MG PO TABS
1.0000 | ORAL_TABLET | Freq: Every day | ORAL | Status: DC
  Administered 2012-05-03: 1 via ORAL
  Filled 2012-05-03 (×2): qty 1

## 2012-05-03 NOTE — H&P (Signed)
Patient ID: Jonathan Acevedo MRN: [id number], DOB/AGE: 77-22-1930   Admit date: 05/03/2012   Primary Physician: Herb Grays, MD Primary Cardiologist: Dr. Swaziland  Pt. Profile:   Problem List  Past Medical History  Diagnosis Date  . Myocardial infarction 1998  . Coronary artery disease     w remote anterior myocardial infarction  . Hypertension   . Renal insufficiency   . Aortic stenosis   . Rectal polyp   . Hypercholesteremia   . Kidney stones   . 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 Hypercalcemia 12/26/2011    Past Surgical History  Procedure Laterality Date  . Cardiac catheterization  08/18/2008  . Umbilical hernia repair    . Inguinal hernia repair      left  . Orif femur fracture      right  . Cataract extraction       Allergies  Allergies  Allergen Reactions  . Risedronate Sodium Other (See Comments)    Reaction=unknown    CC: chest discomfort  HPI  79 y/o male with h/o CAD s/p MI in the late 1990s, last cath per him ~5 years ago (no intervention), last stress per him ~3 years ago, and moderate AS who presents with chest discomfort.  He was in his usual state of health (fully functional at baseline, does regular yard/housework with no symptoms of angina/dyspnea) until this evening when he had an episode of substernal chest discomfort (dull ache) that lasted ~10 minutes, was associated with jaw pain, no other radiation, no other associated symptoms, occurred while sitting, resolved with NTG/ASA given by EMS.  Asymptomatic since ER arrival.  Home Medications  Prior to Admission medications   Medication Sig Start Date End Date Taking? Authorizing Provider  aspirin 325 MG EC tablet Take 325 mg by mouth daily.    Yes Historical Provider, MD  enalapril (VASOTEC) 20 MG tablet Take 1 tablet (20 mg total) by mouth daily. 11/09/11  Yes Peter M Swaziland, MD  ezetimibe-simvastatin (VYTORIN) 10-20 MG per tablet Take 1 tablet by mouth at bedtime. 12/26/11  Yes Peter M  Sw[place]iland, MD  ferrous sulfate (CVS IRON) 325 (65 FE) MG tablet Take 325 mg by mouth daily with breakfast.   Yes Historical Provider, MD  Ethelda Chick (OYSTER CALCIUM) 500 MG TABS Take 500 mg of elemental calcium by mouth 2 (two) times daily.   Yes Historical Provider, MD  Tamsulosin HCl (FLOMAX) 0.4 MG CAPS Take 0.4 mg by mouth as directed.   Yes Historical Provider, MD  HYDROcodone-acetaminophen (NORCO) 5-325 MG per tablet Take 1-2 tablets by mouth every 6 (six) hours as needed for pain. 11/01/11   Celene Kras, MD    Family History  History reviewed. No pertinent family history.  Social History  History   Social History  . Marital Status: Married    Spouse Name: N/A    Number of Children: N/A  . Years of Education: N/A   Occupational History  . [employer]     retired   Social History Main Topics  . Smoking status: Former Smoker -- 1.00 packs/day for 45 years    Types: Cigarettes    Quit date: 07/19/1996  . Smokeless tobacco: Never Used  . Alcohol Use: No  . Drug Use: No  . Sexually Active: Not on file   Other Topics Concern  . Not on file   Social History Narrative  . No narrative on file     Review of Systems  12 point review of  systems reviewed and are otherwise negative except as noted above.  Physical Exam  Blood pressure 141/82, pulse 62, resp. rate 20, SpO2 98.00%.  General: Pleasant, NAD Psych: Normal affect. Neuro: Alert and oriented X 3. Moves all extremities spontaneously. HEENT: MMM  Neck: Supple without JVD; b/l bruits are likely radiation of his aortic valve murmur Lungs:  Resp regular and unlabored, CTA. Heart: RRR no s3, s4; 2/6 early peaking systolic murmur heard throughout, S2 preserved, radiates to subclavians/carotids Abdomen: Soft, non-tender, non-distended Extremities: trace LE edema. DP/PT 1+, Radials 2+  Labs   Recent Labs  05/03/12 1801  TROPONINI <0.30   Lab Results  Component Value Date   WBC 7.2 05/03/2012   HGB 11.9*  05/03/2012   HCT 34.9* 05/03/2012   MCV 90.2 05/03/2012   PLT 183 05/03/2012    Recent Labs Lab 05/03/12 1800  NA 142  K 4.4  CL 107  CO2 28  BUN 31*  CREATININE 1.70*  CALCIUM 9.1  GLUCOSE 104*   Lab Results  Component Value Date   CHOL  Value: 120        ATP III CLASSIFICATION:  <200     mg/dL   Desirable  045-409  mg/dL   Borderline High  >=811    mg/dL   High        10/21/7827   HDL 43 08/17/2008   LDLCALC  Value: 58        Total Cholesterol/HDL:CHD Risk Coronary Heart Disease Risk Table                     Men   Women  1/2 Average Risk   3.4   3.3  Average Risk       5.0   4.4  2 X Average Risk   9.6   7.1  3 X Average Risk  23.4   11.0        Use the calculated Patient Ratio above and the CHD Risk Table to determine the patient's CHD Risk.        ATP III CLASSIFICATION (LDL):  <100     mg/dL   Optimal  562-130  mg/dL   Near or Above                    Optimal  130-159  mg/dL   Borderline  865-784  mg/dL   High  >696     mg/dL   Very High 2/95/2841   TRIG 96 08/17/2008   No results found for this basename: DDIMER     Radiology/Studies  Dg Chest Port 1 View  05/03/2012  *RADIOLOGY REPORT*  Clinical Data: Substernal chest pain.  Hypertension.  Prior myocardial infarctions.  PORTABLE CHEST - 1 VIEW  Comparison: 08/17/2008  Findings: Low lung volumes are present, causing crowding of the pulmonary vasculature.  Tortuous thoracic aorta noted with atherosclerotic calcification of the aortic arch.  Borderline cardiomegaly noted.  There is linear subsegmental atelectasis or scarring in both lung bases.  IMPRESSION:  1.  Borderline cardiomegaly, without edema. 2.  Linear opacities at both lung bases compatible with subsegmental atelectasis or scarring. 3.  Low lung volumes. 4.  Tortuous aorta.   Original Report Authenticated By: Gaylyn Rong, M.D.     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  ASSESSMENT AND PLAN  1) Chest  discomfort:  -TIMI risk score of 3  -Given h/o CAD, the  amount of time since last eval, symptoms of typical angina, and the abrupt onset, concerning for UA although initial troponin normal and no ischemic ECG changes  -Warrents overnight obs admit for serial biomarkers, telemetry, repeat ECG if more pain  -Will not start UFH unless he has more symptoms or biomarkers become positive  -Likely needs stress, could potentially do as outpatient next week pending overnight evaluation; will defer to a.m. MD  2) Moderate AS in 2012:   -outpatient plan was f/u TTE sometime this spring; on exam, no evidence of severe AS; I do not think this needs to be done in house; elevated pro-BNP is concerning for worsening disease (vs. AKI)  3) Mild AKI  -hold nephrotoxins, f/u BMP in a.m.  4) h/o ischemic CM  -will get f/u TTE in near future per prior plan; no evidence of CHF on exam  5) h/o CAD  -continue aspirin, statin; see #1  DVT prophylaxis- ambulation Code: Full (has living will)     Ozella Almond, MD 05/03/2012, 9:25 PM

## 2012-05-03 NOTE — ED Notes (Signed)
Patient arrived via [facility] from home with left sided chest pain that radiated to his jaw. Patient states this is the same pain he had with his MI in the past. Denies N/V/D and shortness of breath. Patient is pain free at this time. EMS gave Asprin 324mg  and NTG SL x 1.

## 2012-05-03 NOTE — ED Provider Notes (Signed)
History     CSN: [id number]  Arrival date & time 05/03/12  1726   First MD Initiated Contact with Patient 05/03/12 1755      Chief Complaint  Patient presents with  . Chest Pain    (Consider location/radiation/quality/duration/timing/severity/associated sxs/prior treatment) HPI Comments: Pt with hx of CAD, moderate AS comes in with cc of chest pain. Pt states that he started having substernal chest pain and jaw pain while on computer. The pain was sharp, non radiating. EMS was called. Pt received full dose  ASA and nitro x 2, and 10 minutes after the start of the pain. No associated n/dib/diophoresis.  Pt has not had chest pain over the past several years. He had stents placed few years back, and reports having his last stress at least 3 years ago.  Pt has AS, but denies any recent near syncope events.  Patient is a 79 y.o. male presenting with chest pain. The history is provided by the patient and medical records.  Chest Pain Associated symptoms: dizziness   Associated symptoms: no cough, no fever, no headache and no shortness of breath     Past Medical History  Diagnosis Date  . Myocardial infarction 1998  . Coronary artery disease     w remote anterior myocardial infarction  . Hypertension   . Renal insufficiency   . Aortic stenosis   . Rectal polyp   . Hypercholesteremia   . Kidney stones   . 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 Hypercalcemia 12/26/2011    Past Surgical History  Procedure Laterality Date  . Cardiac catheterization  08/18/2008  . Umbilical hernia repair    . Inguinal hernia repair      left  . Orif femur fracture      right  . Cataract extraction      History reviewed. No pertinent family history.  History  Substance Use Topics  . Smoking status: Former Smoker -- 1.00 packs/day for 45 years    Types: Cigarettes    Quit date: 07/19/1996  . Smokeless tobacco: Never Used  . Alcohol Use: No      Review of Systems  Constitutional: Negative  for fever, chills and activity change.  HENT: Negative for neck pain.   Eyes: Negative for visual disturbance.  Respiratory: Negative for cough, chest tightness and shortness of breath.   Cardiovascular: Positive for chest pain.  Gastrointestinal: Negative for abdominal distention.  Genitourinary: Negative for dysuria, enuresis and difficulty urinating.  Musculoskeletal: Negative for arthralgias.  Neurological: Positive for dizziness. Negative for light-headedness and headaches.  Psychiatric/Behavioral: Negative for confusion.    Allergies  Risedronate sodium  Home Medications   Current Outpatient Rx  Name  Route  Sig  Dispense  Refill  . aspirin 325 MG EC tablet   Oral   Take 325 mg by mouth daily.          . enalapril (VASOTEC) 20 MG tablet   Oral   Take 1 tablet (20 mg total) by mouth daily.   90 tablet   3   . ezetimibe-simvastatin (VYTORIN) 10-20 MG per tablet   Oral   Take 1 tablet by mouth at bedtime.   90 tablet   3   . ferrous sulfate (CVS IRON) 325 (65 FE) MG tablet   Oral   Take 325 mg by mouth daily with breakfast.         . Oyster Shell (OYSTER CALCIUM) 500 MG TABS   Oral   Take 500 mg of elemental  calcium by mouth 2 (two) times daily.         . Tamsulosin HCl (FLOMAX) 0.4 MG CAPS   Oral   Take 0.4 mg by mouth as directed.         Marland Kitchen HYDROcodone-acetaminophen (NORCO) 5-325 MG per tablet   Oral   Take 1-2 tablets by mouth every 6 (six) hours as needed for pain.   20 tablet   0     SpO2 100%  Physical Exam  Nursing note and vitals reviewed. Constitutional: He is oriented to person, place, and time. He appears well-developed.  HENT:  Head: Normocephalic and atraumatic.  Eyes: Conjunctivae and EOM are normal. Pupils are equal, round, and reactive to light.  Neck: Normal range of motion. Neck supple.  Cardiovascular: Normal rate, regular rhythm and intact distal pulses.   Murmur heard. Pulmonary/Chest: Effort normal and breath sounds  normal.  Abdominal: Soft. Bowel sounds are normal. He exhibits no distension. There is no tenderness. There is no rebound and no guarding.  Neurological: He is alert and oriented to person, place, and time.  Skin: Skin is warm.    ED Course  Procedures (including critical care time)  Labs Reviewed  CBC WITH DIFFERENTIAL - Abnormal; Notable for the following:    RBC 3.87 (*)    Hemoglobin 11.9 (*)    HCT 34.9 (*)    All other components within normal limits  BASIC METABOLIC PANEL - Abnormal; Notable for the following:    Glucose, Bld 104 (*)    BUN 31 (*)    Creatinine, Ser 1.70 (*)    GFR calc non Af Amer 36 (*)    GFR calc Af Amer 41 (*)    All other components within normal limits  PRO B NATRIURETIC PEPTIDE - Abnormal; Notable for the following:    Pro B Natriuretic peptide (BNP) 787.0 (*)    All other components within normal limits  TROPONIN I  PROTIME-INR  APTT  URINALYSIS, ROUTINE W REFLEX MICROSCOPIC   Dg Chest Port 1 View  05/03/2012  *RADIOLOGY REPORT*  Clinical Data: Substernal chest pain.  Hypertension.  Prior myocardial infarctions.  PORTABLE CHEST - 1 VIEW  Comparison: 08/17/2008  Findings: Low lung volumes are present, causing crowding of the pulmonary vasculature.  Tortuous thoracic aorta noted with atherosclerotic calcification of the aortic arch.  Borderline cardiomegaly noted.  There is linear subsegmental atelectasis or scarring in both lung bases.  IMPRESSION:  1.  Borderline cardiomegaly, without edema. 2.  Linear opacities at both lung bases compatible with subsegmental atelectasis or scarring. 3.  Low lung volumes. 4.  Tortuous aorta.   Original Report Authenticated By: Gaylyn Rong, M.D.      No diagnosis found.    MDM   Date: 05/03/2012  Rate: 64  Rhythm: normal sinus rhythm  QRS Axis: left  Intervals: normal  ST/T Wave abnormalities: nonspecific ST/T changes  Conduction Disutrbances:right bundle branch block and left anterior fascicular  block  Narrative Interpretation:   Old EKG Reviewed: unchanged  Differential diagnosis includes: ACS syndrome CHF exacerbation Valvular disorder Myocarditis Pericarditis Pericardial effusion Pneumonia Pleural effusion Pulmonary edema PE Anemia  Pt comes in with cc of chest pain and jaw pain. Pt has hx of CAD, had stents placed few years back, no recent provocative testing (states last stress was 3 years ago). Pt also has moderate AS. He comes in with substernal chest pain with some jaw pain that resolved with nitro and asa. Chest pain free at this  time. Will Call cards for their recs - as this could be stable angina if the trop comes back normal.    Derwood Kaplan, MD 05/03/12 2009

## 2012-05-04 DIAGNOSIS — I359 Nonrheumatic aortic valve disorder, unspecified: Secondary | ICD-10-CM

## 2012-05-04 DIAGNOSIS — R079 Chest pain, unspecified: Secondary | ICD-10-CM

## 2012-05-04 DIAGNOSIS — R072 Precordial pain: Secondary | ICD-10-CM | POA: Diagnosis present

## 2012-05-04 DIAGNOSIS — E7800 Pure hypercholesterolemia, unspecified: Secondary | ICD-10-CM

## 2012-05-04 DIAGNOSIS — I2510 Atherosclerotic heart disease of native coronary artery without angina pectoris: Secondary | ICD-10-CM

## 2012-05-04 LAB — URINALYSIS, ROUTINE W REFLEX MICROSCOPIC
Glucose, UA: NEGATIVE mg/dL
Hgb urine dipstick: NEGATIVE
Leukocytes, UA: NEGATIVE
Specific Gravity, Urine: 1.019 (ref 1.005–1.030)
Urobilinogen, UA: 0.2 mg/dL (ref 0.0–1.0)

## 2012-05-04 LAB — BASIC METABOLIC PANEL
CO2: 27 mEq/L (ref 19–32)
Calcium: 9.1 mg/dL (ref 8.4–10.5)
Creatinine, Ser: 1.45 mg/dL — ABNORMAL HIGH (ref 0.50–1.35)
GFR calc non Af Amer: 43 mL/min — ABNORMAL LOW (ref >90)
Glucose, Bld: 96 mg/dL (ref 70–99)
Sodium: 140 mEq/L (ref 135–145)

## 2012-05-04 LAB — TROPONIN I: Troponin I: <0.3 ng/mL (ref <0.30)

## 2012-05-04 MED ORDER — NITROGLYCERIN 0.4 MG SL SUBL: 0.4000 mg | SUBLINGUAL_TABLET | SUBLINGUAL | Status: DC | PRN

## 2012-05-04 NOTE — Progress Notes (Signed)
   Primary cardiologist: Dr. Peter Swaziland  Subjective:   No chest pain. Eager to go home today - does not want to wait on stress testing tomorrow.   Objective:   Temp:  [98.2 F (36.8 C)] 98.2 F (36.8 C) (03/29 0431) Pulse Rate:  [62-70] 62 (03/29 0431) Resp:  [18-20] 18 (03/29 0431) BP: (141-159)/(79-86) 151/79 mmHg (03/29 0431) SpO2:  [93 %-100 %] 93 % (03/29 0431) Weight:  [202 lb (91.627 kg)] 202 lb (91.627 kg) (03/28 2135) Last BM Date: 05/03/12  Filed Weights   05/03/12 2135  Weight: 202 lb (91.627 kg)    Intake/Output Summary (Last 24 hours) at 05/04/12 1222 Last data filed at 05/04/12 0900  Gross per 24 hour  Intake    300 ml  Output    850 ml  Net   -550 ml   Telemetry: Sinus bradycardia  Exam:  General: NAD.  Lungs: Clear, nonlabored.  Cardiac: RRR, no gallop.  Extremities: No edema.  Lab Results:  Basic Metabolic Panel:  Recent Labs Lab 05/03/12 1800 05/04/12 0255  NA 142 140  K 4.4 4.2  CL 107 106  CO2 28 27  GLUCOSE 104* 96  BUN 31* 31*  CREATININE 1.70* 1.45*  CALCIUM 9.1 9.1    CBC:  Recent Labs Lab 05/03/12 1800  WBC 7.2  HGB 11.9*  HCT 34.9*  MCV 90.2  PLT 183    Cardiac Enzymes:  Recent Labs Lab 05/03/12 1801 05/03/12 2326 05/04/12 0302  TROPONINI <0.30 <0.30 <0.30    BNP:  Recent Labs  05/03/12 1801  PROBNP 787.0*    Coagulation:  Recent Labs Lab 05/03/12 1800  INR 1.08    ECG: Sinus rhythm with LAFB, old anterior infarct with resiual ST elevation.   Medications:   Scheduled Medications: . sodium chloride   Intravenous Once  . aspirin  162 mg Oral Once  . aspirin  325 mg Oral Daily  . enalapril  20 mg Oral Daily  . ezetimibe-simvastatin  1 tablet Oral QHS  . sodium chloride  3 mL Intravenous Q12H  . tamsulosin  0.4 mg Oral Daily     PRN Medications:  HYDROcodone-acetaminophen, nitroGLYCERIN   Assessment:   1. Chest pain syndrome, troponin I negative. ECG changes are old. No  recurrent chest pain.  2. CAD s/p remote AMI, nonobstructive CAD at catheterization 2010.  3. Moderate aortic stenosis. Due for follow-up echocardiogram.  4. HL, on statin.   Plan/Discussion:    Had long discussion with patient. He does not want to stay for inpatient stress testing, has had no more chest pain, and troponin I negative. He wants to go home and will be discharged on medical therapy with plan for outpatient exercise Myoview (as well as echocardiogram to follow-up aortic stenosis) in the [place] office. Results to Dr. Swaziland and he can determine appropriate follow-up from there - has routine visit already in May per patient. Discussed with Ms. Raford Pitcher [facility] who will be arranging testing.   Jonelle Sidle, M.D., F.A.C.C.

## 2012-05-04 NOTE — Progress Notes (Signed)
UR completed 

## 2012-05-04 NOTE — Discharge Summary (Signed)
See my rounding note. 

## 2012-05-04 NOTE — Discharge Summary (Signed)
CARDIOLOGY DISCHARGE SUMMARY   Patient ID: Jonathan Acevedo MRN: [id number] DOB/AGE: Mar 11, 1928 79 y.o.  Admit date: 05/03/2012 Discharge date: 05/04/2012  Primary Discharge Diagnosis:    Precordial pain - MI ruled out, outpatient stress test planned Secondary Discharge Diagnosis:    Coronary artery disease   Hypertension   Renal insufficiency  Procedures:  Chest x-ray  Hospital Course: Jonathan Acevedo is a 79 y.o. male with a history of CAD. He had chest pain he came to the hospital where he was admitted for further evaluation and treatment.  His cardiac enzymes were negative for MI. His ECG had no acute ischemic changes. His blood pressure was monitored but was controlled. His renal function was at baseline. On 05/04/2012, he was evaluated by Dr. Diona Browner. Dr. Diona Browner felt Jonathan Acevedo was stable for discharge, with an outpatient stress test to be arranged.  Labs:   Lab Results  Component Value Date   WBC 7.2 05/03/2012   HGB 11.9* 05/03/2012   HCT 34.9* 05/03/2012   MCV 90.2 05/03/2012   PLT 183 05/03/2012     Recent Labs Lab 05/04/12 0255  NA 140  K 4.2  CL 106  CO2 27  BUN 31*  CREATININE 1.45*  CALCIUM 9.1  GLUCOSE 96    Recent Labs  05/03/12 1801 05/03/12 2326 05/04/12 0302  TROPONINI <0.30 <0.30 <0.30   Lipid Panel    Pro B Natriuretic peptide (BNP)  Date/Time Value Range Status  05/03/2012  6:01 PM 787.0* 0 - 450 pg/mL Final  08/17/2008  9:27 AM 126.0* 0.0 - 100.0 pg/mL Final    Recent Labs  05/03/12 1800  INR 1.08      Radiology: Dg Chest Port 1 View 05/03/2012  *RADIOLOGY REPORT*  Clinical Data: Substernal chest pain.  Hypertension.  Prior myocardial infarctions.  PORTABLE CHEST - 1 VIEW  Comparison: 08/17/2008  Findings: Low lung volumes are present, causing crowding of the pulmonary vasculature.  Tortuous thoracic aorta noted with atherosclerotic calcification of the aortic arch.  Borderline cardiomegaly noted.  There is linear subsegmental  atelectasis or scarring in both lung bases.  IMPRESSION:  1.  Borderline cardiomegaly, without edema. 2.  Linear opacities at both lung bases compatible with subsegmental atelectasis or scarring. 3.  Low lung volumes. 4.  Tortuous aorta.   Original Report Authenticated By: Gaylyn Rong, M.D.    EKG:  04-May-2012 04:27:08  Sinus bradycardia Left anterior fascicular block Abnormal ECG 51mm/s 49mm/mV 100Hz  8.0.1 12SL 241 HD CID: 1 Referred by: Unconfirmed Vent. rate 59 BPM PR interval 162 ms QRS duration 98 ms QT/QTc 394/390 ms P-R-T axes 59 -58 56  FOLLOW UP PLANS AND APPOINTMENTS Allergies  Allergen Reactions  . Risedronate Sodium Other (See Comments)    Reaction=unknown     Medication List    TAKE these medications       aspirin 325 MG EC tablet  Take 325 mg by mouth daily.     CVS IRON 325 (65 FE) MG tablet  Generic drug:  ferrous sulfate  Take 325 mg by mouth daily with breakfast.     enalapril 20 MG tablet  Commonly known as:  VASOTEC  Take 1 tablet (20 mg total) by mouth daily.     ezetimibe-simvastatin 10-20 MG per tablet  Commonly known as:  VYTORIN  Take 1 tablet by mouth at bedtime.     HYDROcodone-acetaminophen 5-325 MG per tablet  Commonly known as:  NORCO  Take 1-2 tablets by mouth every 6 (six)  hours as needed for pain.     nitroGLYCERIN 0.4 MG SL tablet  Commonly known as:  NITROSTAT  Place 1 tablet (0.4 mg total) under the tongue every 5 (five) minutes as needed for chest pain.     oyster calcium 500 MG Tabs  Take 500 mg of elemental calcium by mouth 2 (two) times daily.     tamsulosin 0.4 MG Caps  Commonly known as:  FLOMAX  Take 0.4 mg by mouth as directed.        Discharge Orders   Future Appointments Provider Department Dept Phone   06/24/2012 9:00 AM Peter M Swaziland, MD [facility] Main Office [place]) [phone]   Future Orders Complete By Expires     Diet - low sodium heart healthy  As directed     Increase activity slowly   As directed       Follow-up Information   Follow up with Peter Swaziland, MD On 06/24/2012. (At 9 AM)    Contact information:   [address] [place] [address] [phone]       Follow up with [facility] [address] (The office will call regarding stress testing)    Contact information:   [address] [place] [address]       BRING ALL MEDICATIONS WITH YOU TO FOLLOW UP APPOINTMENTS  Time spent with patient to include physician time: 33 min Signed: Theodore Demark, PA-C 05/04/2012, 1:43 PM Co-Sign MD

## 2012-05-10 ENCOUNTER — Telehealth: Payer: Self-pay | Admitting: Cardiology

## 2012-05-10 DIAGNOSIS — R079 Chest pain, unspecified: Secondary | ICD-10-CM

## 2012-05-10 NOTE — Telephone Encounter (Signed)
Patient called no answer.Left message on personal voice mail schedulers will call next week to schedule exercise myoview.

## 2012-05-10 NOTE — Telephone Encounter (Signed)
New problem    Attaching message from [facility]    Please contact him regarding an outpatient Myoview and followup with either Dr. Swaziland or a PA/NP. The Myoview is for chest pain and Lexi scan is fine. Do not think he will need to be a 2 day study. Thanks very much, sorry about all the messages Bjorn Loser

## 2012-05-10 NOTE — Telephone Encounter (Signed)
Follow Up   Pt calling in returning phone call from earlier today. Please call.

## 2012-05-13 ENCOUNTER — Telehealth: Payer: Self-pay | Admitting: Cardiology

## 2012-05-13 NOTE — Telephone Encounter (Signed)
Patient called no answer.LMTC. 

## 2012-05-13 NOTE — Telephone Encounter (Signed)
New problem   Pt want to talk to you about him having a treadmill stress test. Pt want to know what is going on. Please call pt concerning this matter.

## 2012-05-14 NOTE — Telephone Encounter (Signed)
Spoke to patient he stated he was confused about  treadmill scheduled.Patient was told he is scheduled for a lexiscan myoview 05/23/12.Instructions explained to patient and he understood.Advised to keep appointment with Dr.Jordan 06/24/12.

## 2012-05-15 ENCOUNTER — Encounter (HOSPITAL_COMMUNITY): Payer: Medicare Other

## 2012-05-23 ENCOUNTER — Ambulatory Visit (HOSPITAL_COMMUNITY): Payer: Medicare Other | Attending: Cardiology | Admitting: Radiology

## 2012-05-23 VITALS — BP 133/77 | Ht 71.0 in | Wt 205.0 lb

## 2012-05-23 DIAGNOSIS — Z87891 Personal history of nicotine dependence: Secondary | ICD-10-CM | POA: Insufficient documentation

## 2012-05-23 DIAGNOSIS — I10 Essential (primary) hypertension: Secondary | ICD-10-CM | POA: Insufficient documentation

## 2012-05-23 DIAGNOSIS — R0602 Shortness of breath: Secondary | ICD-10-CM

## 2012-05-23 DIAGNOSIS — I2510 Atherosclerotic heart disease of native coronary artery without angina pectoris: Secondary | ICD-10-CM

## 2012-05-23 DIAGNOSIS — R079 Chest pain, unspecified: Secondary | ICD-10-CM | POA: Insufficient documentation

## 2012-05-23 DIAGNOSIS — I4510 Unspecified right bundle-branch block: Secondary | ICD-10-CM | POA: Insufficient documentation

## 2012-05-23 DIAGNOSIS — I5022 Chronic systolic (congestive) heart failure: Secondary | ICD-10-CM

## 2012-05-23 DIAGNOSIS — I252 Old myocardial infarction: Secondary | ICD-10-CM | POA: Insufficient documentation

## 2012-05-23 DIAGNOSIS — E785 Hyperlipidemia, unspecified: Secondary | ICD-10-CM | POA: Insufficient documentation

## 2012-05-23 MED ORDER — REGADENOSON 0.4 MG/5ML IV SOLN
0.4000 mg | Freq: Once | INTRAVENOUS | Status: AC
  Administered 2012-05-23: 0.4 mg via INTRAVENOUS

## 2012-05-23 MED ORDER — TECHNETIUM TC 99M SESTAMIBI GENERIC - CARDIOLITE
30.0000 | Freq: Once | INTRAVENOUS | Status: AC | PRN
  Administered 2012-05-23: 30 via INTRAVENOUS

## 2012-05-23 MED ORDER — TECHNETIUM TC 99M SESTAMIBI GENERIC - CARDIOLITE
10.0000 | Freq: Once | INTRAVENOUS | Status: AC | PRN
  Administered 2012-05-23: 10 via INTRAVENOUS

## 2012-05-23 NOTE — Progress Notes (Signed)
[facility] SITE 3 NUCLEAR MED [address] [place], [place] [address] [phone]    Cardiology Nuclear Med Study  EDOARDO LAFORTE is a 79 y.o. male     MRN : [id number]     DOB: 02-15-1928  Procedure Date: 05/23/2012  Nuclear Med Background Indication for Stress Test:  Evaluation for Ischemia, Stent Patency and Post Hospital History:  2012 Echo EF 35-40% 2010 Heart Catheterization - patent stent, N/O CAD 1998 MI Stent-LAD Cardiac Risk Factors: History of Smoking, Hypertension, Lipids and RBBB  Symptoms:  Chest Pain   Nuclear Pre-Procedure Caffeine/Decaff Intake:  None NPO After: 8:00pm   Lungs:  clear O2 Sat: 95% on room air. IV 0.9% NS with Angio Cath:  22g  IV Site: R Hand  IV Started by:  Bonnita Levan, RN  Chest Size (in):  46 Cup Size: n/a  Height: 5\' 11"  (1.803 m)  Weight:  205 lb (92.987 kg)  BMI:  Body mass index is 28.6 kg/(m^2). Tech Comments:  N/A    Nuclear Med Study 1 or 2 day study: 1 day  Stress Test Type:  Stress  Reading MD: Marca Ancona, MD  Order Authorizing Provider:  Peter Swaziland, MD  Resting Radionuclide: Technetium 29m Sestamibi  Resting Radionuclide Dose: 11.0 mCi   Stress Radionuclide:  Technetium 52m Sestamibi  Stress Radionuclide Dose: 33.0 mCi           Stress Protocol Rest HR: 61 Stress HR: 109  Rest BP: 133/77 Stress BP: 111/56  Exercise Time (min): 6:29 METS: 5.9   Predicted Max HR: 138 bpm % Max HR: 78.99 bpm Rate Pressure Product: 21308   Dose of Adenosine (mg):  n/a Dose of Lexiscan: 0.4 mg  Dose of Atropine (mg): n/a Dose of Dobutamine: n/a mcg/kg/min (at max HR)  Stress Test Technologist: Bonnita Levan, RN  Nuclear Technologist:  Domenic Polite, CNMT     Rest Procedure:  Myocardial perfusion imaging was performed at rest 45 minutes following the intravenous administration of Technetium 72m Sestamibi. Rest ECG: NSR - Normal EKG , Stress Procedure: The patient attempted the Bruce protocol for 4:45 minutes. He c/o of  fatigue, dyspnea, back and leg pain. He denied any chest pain. He was unable to achieve THR and was changed to a walking Lexiscan. The patient received IV Lexiscan 0.4 mg over 15-seconds with concurrent low level exercise and then Technetium 28m Sestamibi was injected at 30-seconds while the patient continued walking one more minute.  Quantitative spect images were obtained after a 45-minute delay. Stress ECG: No significant change from baseline ECG  QPS Raw Data Images:  Normal; no motion artifact; normal heart/lung ratio. Stress Images:  Large, severe mid to apical anterior, apical inferior, mid to apical anteroseptal, and true apex perfusion defect.  Rest Images:  Large, severe mid to apical anterior, apical inferior, mid to apical anteroseptal, and true apex perfusion defect. Subtraction (SDS):  Large area of infarction.  Transient Ischemic Dilatation (Normal <1.22):  1.03 Lung/Heart Ratio (Normal <0.45):  0.35  Quantitative Gated Spect Images QGS EDV:  151 ml QGS ESV:  84 ml  Impression Exercise Capacity:  Fair exercise capacity.  Lexiscan was given due to inability to achieve target heart rate. BP Response:  Normal blood pressure response. Clinical Symptoms:  Back pain, dyspnea.  ECG Impression:  No significant ST segment change suggestive of ischemia. Comparison with Prior Nuclear Study: No images to compare  Overall Impression:  Intermediate stress nuclear study.  Fixed, large, severe mid  to apical anterior, apical inferior, mid to apical anteroseptal, and true apex perfusion defect.  This suggests prior infarction with minimal peri-infarct ischemia.   LV Ejection Fraction: 44%.  LV Wall Motion:  Peri-apical akinesis.   Marca Ancona 05/23/2012

## 2012-05-27 ENCOUNTER — Encounter: Payer: Self-pay | Admitting: Cardiology

## 2012-05-28 ENCOUNTER — Encounter: Payer: Self-pay | Admitting: Cardiology

## 2012-06-24 ENCOUNTER — Other Ambulatory Visit: Payer: Self-pay

## 2012-06-24 ENCOUNTER — Ambulatory Visit (INDEPENDENT_AMBULATORY_CARE_PROVIDER_SITE_OTHER): Payer: Medicare Other | Admitting: Cardiology

## 2012-06-24 ENCOUNTER — Encounter: Payer: Self-pay | Admitting: Cardiology

## 2012-06-24 VITALS — BP 130/80 | HR 68 | Ht 71.0 in | Wt 211.2 lb

## 2012-06-24 DIAGNOSIS — I2510 Atherosclerotic heart disease of native coronary artery without angina pectoris: Secondary | ICD-10-CM

## 2012-06-24 DIAGNOSIS — I509 Heart failure, unspecified: Secondary | ICD-10-CM

## 2012-06-24 DIAGNOSIS — I350 Nonrheumatic aortic (valve) stenosis: Secondary | ICD-10-CM

## 2012-06-24 DIAGNOSIS — I5022 Chronic systolic (congestive) heart failure: Secondary | ICD-10-CM

## 2012-06-24 DIAGNOSIS — I10 Essential (primary) hypertension: Secondary | ICD-10-CM

## 2012-06-24 DIAGNOSIS — I359 Nonrheumatic aortic valve disorder, unspecified: Secondary | ICD-10-CM

## 2012-06-24 DIAGNOSIS — E7800 Pure hypercholesterolemia, unspecified: Secondary | ICD-10-CM

## 2012-06-24 NOTE — Patient Instructions (Signed)
Continue your current therapy  Keep up with your exercise  I will see you in 6 months and we'll plan to check an Echo at that time.

## 2012-06-24 NOTE — Progress Notes (Signed)
Jonathan Acevedo Date of Birth: 02-11-1928   History of Present Illness: Jonathan Acevedo is seen for followup today. He was hospitalized briefly in early April with chest pain. A subsequent stress Myoview study showed extensive scar of the anterior apex and inferior wall. Ejection fraction is 44%. This actually represented an improvement from echocardiogram in 2012 at which time his ejection fraction was 35-40%. He denies any recurrent chest pain. His major form of exercise now swimming. He is very limited by low back pain and can no longer run.  Current Outpatient Prescriptions on File Prior to Visit  Medication Sig Dispense Refill  . aspirin 325 MG EC tablet Take 325 mg by mouth daily.       . enalapril (VASOTEC) 20 MG tablet Take 1 tablet (20 mg total) by mouth daily.  90 tablet  3  . ezetimibe-simvastatin (VYTORIN) 10-20 MG per tablet Take 1 tablet by mouth at bedtime.  90 tablet  3  . ferrous sulfate ([employer] IRON) 325 (65 FE) MG tablet Take 325 mg by mouth daily with breakfast.      . HYDROcodone-acetaminophen (NORCO) 5-325 MG per tablet Take 1-2 tablets by mouth every 6 (six) hours as needed for pain.  20 tablet  0  . nitroGLYCERIN (NITROSTAT) 0.4 MG SL tablet Place 1 tablet (0.4 mg total) under the tongue every 5 (five) minutes as needed for chest pain.  25 tablet  3  . Oyster Shell (OYSTER CALCIUM) 500 MG TABS Take 500 mg of elemental calcium by mouth 2 (two) times daily.      . Tamsulosin HCl (FLOMAX) 0.4 MG CAPS Take 0.4 mg by mouth as directed.       No current facility-administered medications on file prior to visit.    Allergies  Allergen Reactions  . Risedronate Sodium Other (See Comments)    Reaction=unknown    Past Medical History  Diagnosis Date  . Myocardial infarction 1998  . Coronary artery disease     w remote anterior myocardial infarction  . Hypertension   . Renal insufficiency   . Aortic stenosis   . Rectal polyp   . Hypercholesteremia   . Kidney stones   .  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 Hypercalcemia 12/26/2011    Past Surgical History  Procedure Laterality Date  . Cardiac catheterization  08/18/2008  . Umbilical hernia repair    . Inguinal hernia repair      left  . Orif femur fracture      right  . Cataract extraction      History  Smoking status  . Former Smoker -- 1.00 packs/day for 45 years  . Types: Cigarettes  . Quit date: 07/19/1996  Smokeless tobacco  . Never Used    History  Alcohol Use No    History reviewed. No pertinent family history.  Review of Systems: As noted in history of present illness. All other systems were reviewed and are negative.  Physical Exam: BP 130/80  Pulse 68  Ht 5\' 11"  (1.803 m)  Wt 211 lb 3.2 oz (95.8 kg)  BMI 29.47 kg/m2  SpO2 95% He is a pleasant white male in no acute distress. HEENT exam is unremarkable. He has no JVD, adenopathy, thyromegaly, or bruits. Lungs are clear. Cardiac exam reveals a regular rate and rhythm with a harsh grade 2/6 systolic murmur heard best at the right upper sternal border. There is no gallop or diastolic murmur. Abdomen is soft and nontender without masses or bruits. He has  trace ankle edema. He is alert and oriented x3. Cranial nerves II through XII are intact. Skin is warm and dry.  LABORATORY DATA: Lab Results  Component Value Date   WBC 7.2 05/03/2012   HGB 11.9* 05/03/2012   HCT 34.9* 05/03/2012   PLT 183 05/03/2012   GLUCOSE 96 05/04/2012   CHOL  Value: 120        ATP III CLASSIFICATION:  <200     mg/dL   Desirable  161-096  mg/dL   Borderline High  >=045    mg/dL   High        05/15/8117   TRIG 96 08/17/2008   HDL 43 08/17/2008   LDLCALC  Value: 58        Total Cholesterol/HDL:CHD Risk Coronary Heart Disease Risk Table                     Men   Women  1/2 Average Risk   3.4   3.3  Average Risk       5.0   4.4  2 X Average Risk   9.6   7.1  3 X Average Risk  23.4   11.0        Use the calculated Patient Ratio above and the CHD Risk Table to  determine the patient's CHD Risk.        ATP III CLASSIFICATION (LDL):  <100     mg/dL   Optimal  147-829  mg/dL   Near or Above                    Optimal  130-159  mg/dL   Borderline  562-130  mg/dL   High  >865     mg/dL   Very High 7/84/6962   ALT 22 01/22/2010   AST 35 01/22/2010   NA 140 05/04/2012   K 4.2 05/04/2012   CL 106 05/04/2012   CREATININE 1.45* 05/04/2012   BUN 31* 05/04/2012   CO2 27 05/04/2012   TSH 1.453 Test methodology is 3rd generation TSH 08/17/2008   INR 1.08 05/03/2012     Assessment / Plan: 1. Coronary disease with remote anterior myocardial infarction. Patient is asymptomatic. He had a cardiac catheterization 2010 which showed nonobstructive disease. Recent Myoview study showed evidence of scar without new ischemia. Ejection fraction of 44%.  2. Chronic systolic congestive heart failure. Ejection fraction 44 %. He appears to be well compensated on his current medications.  3. Moderate aortic stenosis. His last echocardiogram was in October 2012. Plan on repeating an echocardiogram in 6 months.  4. Hypercholesterolemia.  5. Hyperparathyroidism. Status post parathyroidectomy.all

## 2012-07-18 DIAGNOSIS — R609 Edema, unspecified: Secondary | ICD-10-CM | POA: Insufficient documentation

## 2012-07-18 DIAGNOSIS — I872 Venous insufficiency (chronic) (peripheral): Secondary | ICD-10-CM | POA: Insufficient documentation

## 2012-07-20 ENCOUNTER — Ambulatory Visit (INDEPENDENT_AMBULATORY_CARE_PROVIDER_SITE_OTHER): Payer: Medicare Other | Admitting: Family Medicine

## 2012-07-20 VITALS — BP 140/77 | HR 64 | Temp 98.1°F | Resp 16 | Ht 71.0 in | Wt 212.6 lb

## 2012-07-20 DIAGNOSIS — M7989 Other specified soft tissue disorders: Secondary | ICD-10-CM

## 2012-07-20 DIAGNOSIS — I878 Other specified disorders of veins: Secondary | ICD-10-CM

## 2012-07-20 DIAGNOSIS — I872 Venous insufficiency (chronic) (peripheral): Secondary | ICD-10-CM

## 2012-07-20 MED ORDER — HYDROCHLOROTHIAZIDE 12.5 MG PO CAPS: 12.5000 mg | ORAL_CAPSULE | Freq: Every day | ORAL | Status: DC

## 2012-07-20 NOTE — Progress Notes (Signed)
[facility]:  Office Visit  Chief Complaint:  Chief Complaint  Patient presents with  . Leg Swelling    increase the past few days-  . Leg Pain    HPI: Jonathan Acevedo is a 79 y.o. male who complains of  Here with bilateral leg swelling and venous stasis , he has an appt with vascular surgery next week. He has a knot where one of his veins are prominent and is causing him pain. He has been wearing support hoses but they seem to not make a difference. He is on BP meds and also BPH meds and together they cause him to get dizzy so he only takes flomax every other day. NKI, no recent surgery, no malginancy, no recent long car rised,  The leg is only tender to touch, no assym swelling, nono redness, no warmth. He has stable SOB  Past Medical History  Diagnosis Date  . Myocardial infarction 1998  . Coronary artery disease     w remote anterior myocardial infarction  . Hypertension   . Renal insufficiency   . Aortic stenosis   . Rectal polyp   . Hypercholesteremia   . Kidney stones   . 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 Hypercalcemia 12/26/2011   Past Surgical History  Procedure Laterality Date  . Cardiac catheterization  08/18/2008  . Umbilical hernia repair    . Inguinal hernia repair      left  . Orif femur fracture      right  . Cataract extraction     History   Social History  . Marital Status: Married    Spouse Name: N/A    Number of Children: N/A  . Years of Education: N/A   Occupational History  . [employer]     retired   Social History Main Topics  . Smoking status: Former Smoker -- 1.00 packs/day for 45 years    Types: Cigarettes    Quit date: 07/19/1996  . Smokeless tobacco: Never Used  . Alcohol Use: No  . Drug Use: No  . Sexually Active: None   Other Topics Concern  . None   Social History Narrative  . None   No family history on file. Allergies  Allergen Reactions  . Risedronate Sodium Other (See Comments)    Reaction=unknown    Prior to Admission medications   Medication Sig Start Date End Date Taking? Authorizing Provider  aspirin 325 MG EC tablet Take 325 mg by mouth daily.    Yes Historical Provider, MD  enalapril (VASOTEC) 20 MG tablet Take 1 tablet (20 mg total) by mouth daily. 11/09/11  Yes Peter M Swaziland, MD  ezetimibe-simvastatin (VYTORIN) 10-20 MG per tablet Take 1 tablet by mouth at bedtime. 12/26/11  Yes Peter M Swaziland, MD  ferrous sulfate (CVS IRON) 325 (65 FE) MG tablet Take 325 mg by mouth daily with breakfast.   Yes Historical Provider, MD  nitroGLYCERIN (NITROSTAT) 0.4 MG SL tablet Place 1 tablet (0.4 mg total) under the tongue every 5 (five) minutes as needed for chest pain. 05/04/12  Yes Rhonda G Barrett, PA-C  Oyster Shell (OYSTER CALCIUM) 500 MG TABS Take 500 mg of elemental calcium by mouth 2 (two) times daily.   Yes Historical Provider, MD  Tamsulosin HCl (FLOMAX) 0.4 MG CAPS Take 0.4 mg by mouth as directed.   Yes Historical Provider, MD  HYDROcodone-acetaminophen (NORCO) 5-325 MG per tablet Take 1-2 tablets by mouth every 6 (six) hours as needed for pain. 11/01/11  Celene Kras, MD     ROS: The patient denies fevers, chills, night sweats, unintentional weight loss, chest pain, palpitations, wheezing, dyspnea on exertion, nausea, vomiting, abdominal pain, dysuria, hematuria, melena, numbness, weakness, or tingling.  All other systems have been reviewed and were otherwise negative with the exception of those mentioned in the HPI and as above.    PHYSICAL EXAM: Filed Vitals:   07/20/12 1743  BP: 140/77  Pulse: 64  Temp: 98.1 F (36.7 C)  Resp: 16   Filed Vitals:   07/20/12 1743  Height: 5\' 11"  (1.803 m)  Weight: 212 lb 9.6 oz (96.435 kg)   Body mass index is 29.66 kg/(m^2).  General: Alert, no acute distress HEENT:  Normocephalic, atraumatic, oropharynx patent.  Cardiovascular:  Regular rate and rhythm, no rubs, + harsh AS sytolic murmur, no gallops.  No Carotid bruits, radial pulse  intact. No pedal edema.  Respiratory: Clear to auscultation bilaterally.  No wheezes, rales, or rhonchi.  No cyanosis, no use of accessory musculature GI: No organomegaly, abdomen is soft and non-tender, positive bowel sounds.  No masses. Skin: No rashes. Neurologic: Facial musculature symmetric. Psychiatric: Patient is appropriate throughout our interaction. Lymphatic: No cervical lymphadenopathy Musculoskeletal: Gait intact. Neg Homans 5/5s strength, sensationintact Engorged leg veins, venous stasis Palpable cord with tenderness on left medial spect, no assym leg swelling, no warmth, no redness   LABS: Results for orders placed during the hospital encounter of 05/03/12  CBC WITH DIFFERENTIAL      Result Value Range   WBC 7.2  4.0 - 10.5 K/uL   RBC 3.87 (*) 4.22 - 5.81 MIL/uL   Hemoglobin 11.9 (*) 13.0 - 17.0 g/dL   HCT 16.1 (*) 09.6 - 04.5 %   MCV 90.2  78.0 - 100.0 fL   MCH 30.7  26.0 - 34.0 pg   MCHC 34.1  30.0 - 36.0 g/dL   RDW 40.9  81.1 - 91.4 %   Platelets 183  150 - 400 K/uL   Neutrophils Relative % 71  43 - 77 %   Neutro Abs 5.1  1.7 - 7.7 K/uL   Lymphocytes Relative 19  12 - 46 %   Lymphs Abs 1.4  0.7 - 4.0 K/uL   Monocytes Relative 8  3 - 12 %   Monocytes Absolute 0.5  0.1 - 1.0 K/uL   Eosinophils Relative 2  0 - 5 %   Eosinophils Absolute 0.1  0.0 - 0.7 K/uL   Basophils Relative 0  0 - 1 %   Basophils Absolute 0.0  0.0 - 0.1 K/uL  BASIC METABOLIC PANEL      Result Value Range   Sodium 142  135 - 145 mEq/L   Potassium 4.4  3.5 - 5.1 mEq/L   Chloride 107  96 - 112 mEq/L   CO2 28  19 - 32 mEq/L   Glucose, Bld 104 (*) 70 - 99 mg/dL   BUN 31 (*) 6 - 23 mg/dL   Creatinine, Ser 7.82 (*) 0.50 - 1.35 mg/dL   Calcium 9.1  8.4 - 95.6 mg/dL   GFR calc non Af Amer 36 (*) >90 mL/min   GFR calc Af Amer 41 (*) >90 mL/min  TROPONIN I      Result Value Range   Troponin I <0.30  <0.30 ng/mL  PRO B NATRIURETIC PEPTIDE      Result Value Range   Pro B Natriuretic peptide  (BNP) 787.0 (*) 0 - 450 pg/mL  PROTIME-INR  Result Value Range   Prothrombin Time 13.9  11.6 - 15.2 seconds   INR 1.08  0.00 - 1.49  APTT      Result Value Range   aPTT 27  24 - 37 seconds  URINALYSIS, ROUTINE W REFLEX MICROSCOPIC      Result Value Range   Color, Urine YELLOW  YELLOW   APPearance CLEAR  CLEAR   Specific Gravity, Urine 1.019  1.005 - 1.030   pH 6.5  5.0 - 8.0   Glucose, UA NEGATIVE  NEGATIVE mg/dL   Hgb urine dipstick NEGATIVE  NEGATIVE   Bilirubin Urine NEGATIVE  NEGATIVE   Ketones, ur NEGATIVE  NEGATIVE mg/dL   Protein, ur NEGATIVE  NEGATIVE mg/dL   Urobilinogen, UA 0.2  0.0 - 1.0 mg/dL   Nitrite NEGATIVE  NEGATIVE   Leukocytes, UA NEGATIVE  NEGATIVE  BASIC METABOLIC PANEL      Result Value Range   Sodium 140  135 - 145 mEq/L   Potassium 4.2  3.5 - 5.1 mEq/L   Chloride 106  96 - 112 mEq/L   CO2 27  19 - 32 mEq/L   Glucose, Bld 96  70 - 99 mg/dL   BUN 31 (*) 6 - 23 mg/dL   Creatinine, Ser 1.61 (*) 0.50 - 1.35 mg/dL   Calcium 9.1  8.4 - 09.6 mg/dL   GFR calc non Af Amer 43 (*) >90 mL/min   GFR calc Af Amer 50 (*) >90 mL/min  TROPONIN I      Result Value Range   Troponin I <0.30  <0.30 ng/mL  TROPONIN I      Result Value Range   Troponin I <0.30  <0.30 ng/mL     EKG/XRAY:   Primary read interpreted by Dr. Conley [facility] at [facility].   ASSESSMENT/PLAN: Encounter Diagnoses  Name Primary?  . Venous stasis Yes  . Leg swelling    Advise patient that he can cut his vasotec down to 1/2 tab daily, from 20 to 10 mg Rx HCTZ 12.5 mg daily to help with diuresis, I could have gone with Lasix but will try that later if HCTZ does nto work C/w Flomax QOD as normal Rest, elevates above heart, Warm compresses, cont with stockings Monitor for hypotension and worsening sxs, I do not suspect this is a DVT, he has CHF and also venous stasis due to vascular insufficiency F/u prn, keep vascular appt   LE, THAO PHUONG, DO 07/22/2012 9:06 AM

## 2012-07-22 ENCOUNTER — Telehealth: Payer: Self-pay | Admitting: Vascular Surgery

## 2012-07-22 NOTE — Telephone Encounter (Signed)
Message copied by Allegra Grana on Mon Jul 22, 2012  2:40 PM ------      Message from: Merri Ray A      Created: Mon Jul 22, 2012  2:25 PM      Regarding: RE: Phone Call      Contact: [phone]       Per Marisue Ivan, you are correct, he would need to contact his PCP.      ----- Message -----         From: Allegra Grana         Sent: 07/22/2012  10:15 AM           To: [facility]      Subject: Phone Call                                               Pt was prescribed support hose from his primary care.  He noticed skin color changes after removing them yesterday.  This is a new (lab only patient) that we are scanning on Friday.   I told him I didn't know if you guys would consult since he isn't our patient?            [phone]        ------

## 2012-07-26 ENCOUNTER — Encounter (INDEPENDENT_AMBULATORY_CARE_PROVIDER_SITE_OTHER): Payer: Medicare Other | Admitting: *Deleted

## 2012-07-26 DIAGNOSIS — I872 Venous insufficiency (chronic) (peripheral): Secondary | ICD-10-CM

## 2012-07-29 ENCOUNTER — Encounter: Payer: Self-pay | Admitting: Surgery

## 2012-07-29 ENCOUNTER — Ambulatory Visit (INDEPENDENT_AMBULATORY_CARE_PROVIDER_SITE_OTHER): Payer: Medicare Other | Admitting: Surgery

## 2012-07-29 VITALS — BP 101/67 | HR 60 | Ht 71.0 in | Wt 209.0 lb

## 2012-07-29 DIAGNOSIS — I872 Venous insufficiency (chronic) (peripheral): Secondary | ICD-10-CM | POA: Insufficient documentation

## 2012-07-29 NOTE — Progress Notes (Signed)
[facility] [place]   Patient name: Jonathan Acevedo MRN: [id number] DOB: 1929-01-31 Sex: male   Referred by: Dr. Collins [place]  Reason for referral:  Chief Complaint  Patient presents with  . Re-evaluation    discuss possible options - duplex LE for chronic venous insuff done on     HISTORY OF PRESENT ILLNESS: The patient comes in today for evaluation of bilateral leg swelling. The patient states that he has been having leg swelling for the past year a half. About 6 months ago, he noticed worsening discoloration. He complains of pain in 3 areas on the left leg which are associated with bulging areas. He has had one episode where he dropped a razor in the shower and it hit one of these areas. He had severe bleeding which required him to go to the hospital. Pressure was held and that ultimately resolved. His swelling is worse at the end of the day. He has had to stop running and start swimming because of the swelling. He has been treated with diuretics recently which did help.  The patient suffers from coronary artery disease. He has had a heart attack stent to his LAD in the remote past. He suffers from hypercholesterolemia and is taking a statin. He is medically managed for his hypertension.  Past Medical History  Diagnosis Date  . Myocardial infarction 1998  . Coronary artery disease     w remote anterior myocardial infarction  . Hypertension   . Renal insufficiency   . Aortic stenosis   . Rectal polyp   . Hypercholesteremia   . Kidney stones   . 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 Hypercalcemia 12/26/2011  . Afib     Past Surgical History  Procedure Laterality Date  . Cardiac catheterization  08/18/2008  . Umbilical hernia repair    . Inguinal hernia repair      left  . Orif femur fracture      right  . Cataract extraction    . Coronary stent placement  07/19/1996  . Parathyroidectomy      History   Social History  . Marital Status: Married    Spouse  Name: N/A    Number of Children: N/A  . Years of Education: N/A   Occupational History  . [employer]     retired   Social History Main Topics  . Smoking status: Former Smoker -- 1.00 packs/day for 45 years    Types: Cigarettes    Quit date: 07/19/1996  . Smokeless tobacco: Never Used  . Alcohol Use: No  . Drug Use: No  . Sexually Active: Not on file   Other Topics Concern  . Not on file   Social History Narrative  . No narrative on file    History reviewed. No pertinent family history.  Allergies as of 07/29/2012 - Review Complete 07/29/2012  Allergen Reaction Noted  . Risedronate sodium Other (See Comments) 05/18/2010    Current Outpatient Prescriptions on File Prior to Visit  Medication Sig Dispense Refill  . aspirin 325 MG EC tablet Take 325 mg by mouth daily.       . enalapril (VASOTEC) 20 MG tablet Take 1 tablet (20 mg total) by mouth daily.  90 tablet  3  . ezetimibe-simvastatin (VYTORIN) 10-20 MG per tablet Take 1 tablet by mouth at bedtime.  90 tablet  3  . ferrous sulfate ([employer]S IRON) 325 (65 FE) MG tablet Take 325 mg by mouth daily with breakfast.      .  hydrochlorothiazide (MICROZIDE) 12.5 MG capsule Take 1 capsule (12.5 mg total) by mouth daily.  15 capsule  0  . HYDROcodone-acetaminophen (NORCO) 5-325 MG per tablet Take 1-2 tablets by mouth every 6 (six) hours as needed for pain.  20 tablet  0  . Oyster Shell (OYSTER CALCIUM) 500 MG TABS Take 500 mg of elemental calcium by mouth 2 (two) times daily.      . Tamsulosin HCl (FLOMAX) 0.4 MG CAPS Take 0.4 mg by mouth as directed.      . nitroGLYCERIN (NITROSTAT) 0.4 MG SL tablet Place 1 tablet (0.4 mg total) under the tongue every 5 (five) minutes as needed for chest pain.  25 tablet  3   No current facility-administered medications on file prior to visit.     REVIEW OF SYSTEMS: Please see history of present illness, otherwise all systems are negative according to the patient  PHYSICAL EXAMINATION: General: The  patient appears their stated age.  Vital signs are BP 101/67  Pulse 60  Ht 5\' 11"  (1.803 m)  Wt 209 lb (94.802 kg)  BMI 29.16 kg/m2  SpO2 96% HEENT:  No gross abnormalities Pulmonary: Respirations are non-labored Musculoskeletal: There are no major deformities.   Neurologic: No focal weakness or paresthesias are detected, Skin: There are no ulcer or rashes noted. Psychiatric: The patient has normal affect. Cardiovascular: Palpable posterior tibial pulse bilaterally. Bilateral lower extremity 2-3+ pitting edema. Multiple prominent varicosities in the left medial leg. Brawny discoloration of bilateral lower extremity  Diagnostic Studies: Venous reflux evaluation was reviewed. This shows no evidence of DVT significant bilateral superficial venous reflux. Bilateral deep venous reflux. The small saphenous is competent bilaterally. The saphenous on the right side measures greater than 0.5 cm. On the left it measures greater than 0.5 cm.    Assessment:  Bilateral venous insufficiency Plan: The patient has significant swelling bilaterally. He has 3 areas which are very tender on the left leg which correspond to bulging varicosities. He does have one episode of severe bleeding requiring a trip to the emergency department. The patient was given a prescription for thigh-high 20-30 mm compression stockings. I believe he would be an excellent candidate for laser ablation with the exception that he has a history of coronary artery disease. His cardiologist is Dr. Swaziland. We will need to discuss with them prior to ablating his saphenous vein. I scheduled the patient to come back to see either Dr. Hart [place] Dr. early to discuss laser ablation in 3 months. He'll contact sooner if he has another episode of bleeding.     Jorge Ny, M.D. [facility] [place] Office: [phone] Pager:  [phone]

## 2012-07-30 ENCOUNTER — Telehealth: Payer: Self-pay | Admitting: Cardiology

## 2012-07-30 NOTE — Telephone Encounter (Signed)
Patient called he stated he continues to have swelling in lower legs and feet.Stated he had a doppler of his legs done at [facility] 07/29/12 and spoke to Dr.Brabham.Stated Dr.Brabham will be speaking to Dr.Jordan.Stated he wanted Dr.Jordan's advice on having possible surgery.Stated he was concerned about closing down a vein in his leg.Also wants to know if he needs to continue hctz or start a different diuretic, will soon need refill on hctz.Message sent to Dr.Jordan for advice.

## 2012-07-30 NOTE — Telephone Encounter (Signed)
Follow up  ° ° ° ° °Pt is returning your call  °

## 2012-07-30 NOTE — Telephone Encounter (Signed)
New Problem  Pt said his feet are swelling and he wants to get some information that will help reduce the swelling.

## 2012-07-30 NOTE — Telephone Encounter (Signed)
Returned call to patient no answer.LMTC. 

## 2012-07-31 NOTE — Telephone Encounter (Signed)
New Prob    Pt has some questions and concerns regarding some of his medications. Please call.

## 2012-07-31 NOTE — Telephone Encounter (Signed)
Returned call to patient no answer.LMTC. 

## 2012-07-31 NOTE — Telephone Encounter (Signed)
Spoke to patient he stated he wanted Dr.Jordan to know Dr.Lee decreased enalapril 20 mg to 1/2 daily,and flomax 0.4 mg 1/2 daily.Stated he is wearing compression stockings and swelling in legs are alittle better this morning.Message sent to Dr.Jordan.

## 2012-08-02 ENCOUNTER — Other Ambulatory Visit: Payer: Self-pay | Admitting: Family Medicine

## 2012-08-12 NOTE — Telephone Encounter (Signed)
New Problem  Pt states that his BP dropped to 84/50 with a hr of 62 this weekend. He said that today it was 117/67 with a hr 54.

## 2012-08-12 NOTE — Telephone Encounter (Signed)
Returned call to patient he stated he has been dizzy.Stated B/P has been low 84/50 pulse 62, today 117/67 pulse 54.Stated he wanted to check with Dr.Jordan on what to do.Stated he is taking flomax 0.4 mg every other day,enalapril 20 mg 1/2 every day,hctz 12.5 mg every day.Message sent to Dr.Jordan for advice.

## 2012-08-13 ENCOUNTER — Telehealth: Payer: Self-pay | Admitting: Cardiology

## 2012-08-13 MED ORDER — ENALAPRIL MALEATE 5 MG PO TABS: 5.0000 mg | ORAL_TABLET | Freq: Every day | ORAL | Status: DC

## 2012-08-13 NOTE — Telephone Encounter (Signed)
Returned call to patient Dr.Jordan advised to decrease enalapril to 5 mg daily,continue other medication.Advised to continue to monitor B/P and call back if continues to be low.

## 2012-08-13 NOTE — Telephone Encounter (Signed)
See previous 08/13/12 note.

## 2012-08-13 NOTE — Addendum Note (Signed)
Addended by: Meda Klinefelter D on: 08/13/2012 03:38 PM   Modules accepted: Orders, Medications

## 2012-08-13 NOTE — Telephone Encounter (Signed)
See previous 08/13/12 note. 

## 2012-08-13 NOTE — Telephone Encounter (Signed)
New problem  Pt states he is returning your call.

## 2012-08-13 NOTE — Telephone Encounter (Signed)
We need to reduce lisinopril to 5 mg daily. Continue other therapy. If low BP continues may need to stop flomax.  Jonathan Seybold [place] MD, [facility]

## 2012-08-14 MED ORDER — ENALAPRIL MALEATE 5 MG PO TABS: 5.0000 mg | ORAL_TABLET | Freq: Every day | ORAL | Status: DC

## 2012-08-14 NOTE — Telephone Encounter (Signed)
Patient requested 90 day supply on enalapril.Prescription mailed to patient.

## 2012-08-14 NOTE — Addendum Note (Signed)
Addended by: Meda Klinefelter D on: 08/14/2012 11:39 AM   Modules accepted: Orders

## 2012-09-11 ENCOUNTER — Other Ambulatory Visit: Payer: Self-pay

## 2012-10-28 ENCOUNTER — Encounter: Payer: Self-pay | Admitting: Vascular Surgery

## 2012-10-29 ENCOUNTER — Ambulatory Visit: Payer: Medicare Other | Admitting: Vascular Surgery

## 2012-11-15 ENCOUNTER — Other Ambulatory Visit: Payer: Self-pay | Admitting: Family Medicine

## 2012-11-16 ENCOUNTER — Other Ambulatory Visit: Payer: Self-pay

## 2012-11-16 ENCOUNTER — Encounter (HOSPITAL_COMMUNITY): Payer: Self-pay | Admitting: Emergency Medicine

## 2012-11-16 ENCOUNTER — Observation Stay (HOSPITAL_COMMUNITY)
Admission: EM | Admit: 2012-11-16 | Discharge: 2012-11-17 | Disposition: A | Discharge status: Home / Self Care | Payer: Medicare Other | Attending: Cardiology | Admitting: Cardiology

## 2012-11-16 ENCOUNTER — Emergency Department (HOSPITAL_COMMUNITY): Payer: Medicare Other

## 2012-11-16 DIAGNOSIS — I350 Nonrheumatic aortic (valve) stenosis: Secondary | ICD-10-CM

## 2012-11-16 DIAGNOSIS — R079 Chest pain, unspecified: Secondary | ICD-10-CM | POA: Insufficient documentation

## 2012-11-16 DIAGNOSIS — I200 Unstable angina: Secondary | ICD-10-CM

## 2012-11-16 DIAGNOSIS — Z9861 Coronary angioplasty status: Secondary | ICD-10-CM | POA: Insufficient documentation

## 2012-11-16 DIAGNOSIS — I2510 Atherosclerotic heart disease of native coronary artery without angina pectoris: Principal | ICD-10-CM

## 2012-11-16 DIAGNOSIS — I5022 Chronic systolic (congestive) heart failure: Secondary | ICD-10-CM

## 2012-11-16 DIAGNOSIS — E7800 Pure hypercholesterolemia, unspecified: Secondary | ICD-10-CM | POA: Diagnosis present

## 2012-11-16 DIAGNOSIS — N183 Chronic kidney disease, stage 3 unspecified: Secondary | ICD-10-CM | POA: Diagnosis present

## 2012-11-16 DIAGNOSIS — I252 Old myocardial infarction: Secondary | ICD-10-CM | POA: Insufficient documentation

## 2012-11-16 DIAGNOSIS — I10 Essential (primary) hypertension: Secondary | ICD-10-CM | POA: Diagnosis present

## 2012-11-16 DIAGNOSIS — I359 Nonrheumatic aortic valve disorder, unspecified: Secondary | ICD-10-CM

## 2012-11-16 DIAGNOSIS — I509 Heart failure, unspecified: Secondary | ICD-10-CM | POA: Insufficient documentation

## 2012-11-16 DIAGNOSIS — R6884 Jaw pain: Secondary | ICD-10-CM | POA: Insufficient documentation

## 2012-11-16 DIAGNOSIS — Z79899 Other long term (current) drug therapy: Secondary | ICD-10-CM | POA: Insufficient documentation

## 2012-11-16 LAB — POCT I-STAT TROPONIN I: Troponin i, poc: 0.01 ng/mL (ref 0.00–0.08)

## 2012-11-16 LAB — POCT I-STAT, CHEM 8
HCT: 37 % — ABNORMAL LOW (ref 39.0–52.0)
Hemoglobin: 12.6 g/dL — ABNORMAL LOW (ref 13.0–17.0)
Potassium: 4.3 mEq/L (ref 3.5–5.1)
Sodium: 142 mEq/L (ref 135–145)
TCO2: 27 mmol/L (ref 0–100)

## 2012-11-16 LAB — TROPONIN I: Troponin I: <0.3 ng/mL (ref <0.30)

## 2012-11-16 MED ORDER — CALCIUM CARBONATE 1250 (500 CA) MG PO TABS
1.0000 | ORAL_TABLET | Freq: Two times a day (BID) | ORAL | Status: DC
  Administered 2012-11-17: 500 mg via ORAL
  Filled 2012-11-16 (×3): qty 1

## 2012-11-16 MED ORDER — SODIUM CHLORIDE 0.9 % IJ SOLN: 3.0000 mL | INTRAMUSCULAR | Status: DC | PRN

## 2012-11-16 MED ORDER — HYDROCHLOROTHIAZIDE 12.5 MG PO CAPS
12.5000 mg | ORAL_CAPSULE | Freq: Every day | ORAL | Status: DC
  Administered 2012-11-17: 12.5 mg via ORAL
  Filled 2012-11-16: qty 1

## 2012-11-16 MED ORDER — OYSTER CALCIUM 500 MG PO TABS: 500.0000 mg | ORAL_TABLET | Freq: Two times a day (BID) | ORAL | Status: DC

## 2012-11-16 MED ORDER — ALPRAZOLAM 0.25 MG PO TABS: 0.2500 mg | ORAL_TABLET | Freq: Two times a day (BID) | ORAL | Status: DC | PRN

## 2012-11-16 MED ORDER — HYDROCODONE-ACETAMINOPHEN 5-325 MG PO TABS
0.5000 | ORAL_TABLET | ORAL | Status: DC | PRN
  Administered 2012-11-17: 1 via ORAL
  Filled 2012-11-16: qty 1

## 2012-11-16 MED ORDER — TAMSULOSIN HCL 0.4 MG PO CAPS
0.4000 mg | ORAL_CAPSULE | Freq: Every day | ORAL | Status: DC
  Filled 2012-11-16 (×2): qty 1

## 2012-11-16 MED ORDER — ASPIRIN EC 325 MG PO TBEC
325.0000 mg | DELAYED_RELEASE_TABLET | Freq: Every morning | ORAL | Status: DC
  Administered 2012-11-17: 325 mg via ORAL
  Filled 2012-11-16: qty 1

## 2012-11-16 MED ORDER — NITROGLYCERIN 0.4 MG SL SUBL: 0.4000 mg | SUBLINGUAL_TABLET | SUBLINGUAL | Status: DC | PRN

## 2012-11-16 MED ORDER — SODIUM CHLORIDE 0.9 % IV SOLN: 250.0000 mL | INTRAVENOUS | Status: DC | PRN

## 2012-11-16 MED ORDER — ZOLPIDEM TARTRATE 5 MG PO TABS: 5.0000 mg | ORAL_TABLET | Freq: Every evening | ORAL | Status: DC | PRN

## 2012-11-16 MED ORDER — FERROUS SULFATE 325 (65 FE) MG PO TABS
325.0000 mg | ORAL_TABLET | Freq: Every day | ORAL | Status: DC
  Administered 2012-11-17: 325 mg via ORAL
  Filled 2012-11-16 (×2): qty 1

## 2012-11-16 MED ORDER — ENALAPRIL MALEATE 20 MG PO TABS
20.0000 mg | ORAL_TABLET | Freq: Every morning | ORAL | Status: DC
  Administered 2012-11-17: 20 mg via ORAL
  Filled 2012-11-16: qty 1

## 2012-11-16 MED ORDER — ACETAMINOPHEN 325 MG PO TABS: 650.0000 mg | ORAL_TABLET | ORAL | Status: DC | PRN

## 2012-11-16 MED ORDER — SODIUM CHLORIDE 0.9 % IJ SOLN
3.0000 mL | Freq: Two times a day (BID) | INTRAMUSCULAR | Status: DC
  Administered 2012-11-16: 3 mL via INTRAVENOUS

## 2012-11-16 MED ORDER — EZETIMIBE-SIMVASTATIN 10-20 MG PO TABS
1.0000 | ORAL_TABLET | Freq: Every day | ORAL | Status: DC
  Administered 2012-11-16: 1 via ORAL
  Filled 2012-11-16 (×2): qty 1

## 2012-11-16 MED ORDER — ONDANSETRON HCL 4 MG/2ML IJ SOLN: 4.0000 mg | Freq: Four times a day (QID) | INTRAMUSCULAR | Status: DC | PRN

## 2012-11-16 MED ORDER — ENOXAPARIN SODIUM 40 MG/0.4ML ~~LOC~~ SOLN
40.0000 mg | SUBCUTANEOUS | Status: DC
  Administered 2012-11-16: 40 mg via SUBCUTANEOUS
  Filled 2012-11-16 (×2): qty 0.4

## 2012-11-16 MED ORDER — HYDROCHLOROTHIAZIDE 12.5 MG PO CAPS: 12.5000 mg | ORAL_CAPSULE | Freq: Every day | ORAL | Status: DC

## 2012-11-16 NOTE — ED Notes (Signed)
Pt reported an Hour ago he had Lt jaw pain into Chest . Pt took one NGT with relief of CP.

## 2012-11-16 NOTE — ED Notes (Signed)
Pt reports taking all AM meds today.

## 2012-11-16 NOTE — Progress Notes (Signed)
  Echocardiogram 2D Echocardiogram has been performed.  Jonathan Acevedo 11/16/2012, 5:30 PM

## 2012-11-16 NOTE — ED Notes (Signed)
Will transport after 3-D echo is completed.

## 2012-11-16 NOTE — ED Notes (Signed)
Food given to patient and wife per Dr Radford Pax.

## 2012-11-16 NOTE — H&P (Signed)
History and Physical   Patient ID: Jonathan Acevedo MRN: [id number], DOB/AGE: March 06, 1928 79 y.o. Date of Encounter: 11/16/2012  Primary Physician: Herb Grays, MD Primary Cardiologist: PJ  Chief Complaint:  Chest pain  HPI: Jonathan Acevedo is a 79 y.o. male with a history of CAD. He was sitting still and had onset of a jaw ache, followed by chest pain.   The chest pain was a tightness, 5/10, like his previous angina. The chest pain persisted so he called EMS and took SL NTG x 1 and ASA 81 mg x 4. His symptoms improved and EMS arrived. The pain has not returned.   He exercises regularly and walks up steps without difficulty. Because of other issues, he has not been swimming in > 1 week, but walked up steps yesterday without symptoms. He has not had any other episodes of pain recently. He was hospitalized in March with chest pain, stress test after that admission showing scar with minimal ischemia, results below. He is reluctant to have a cath but will consider it if MD recommends it. Currently he is resting comfortably.   Past Medical History  Diagnosis Date  . Myocardial infarction 1998  . Coronary artery disease     w remote anterior myocardial infarction  . Hypertension   . Renal insufficiency   . Aortic stenosis   . Rectal polyp   . Hypercholesteremia   . Kidney stones   . 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 Hypercalcemia 12/26/2011  . Afib    Surgical History:  Past Surgical History  Procedure Laterality Date  . Cardiac catheterization  08/18/2008    LMain 20, LAD stent 20 ISR, CFX 10, RCA 20, EF 45  . Umbilical hernia repair    . Inguinal hernia repair      left  . Orif femur fracture      right  . Cataract extraction    . Coronary stent placement  07/19/1996    Proximal LAD  . Parathyroidectomy       I have reviewed the patient's current medications. Medication Sig  aspirin 325 MG EC tablet Take 325 mg by mouth every morning.   enalapril (VASOTEC) 20 MG  tablet Take 20 mg by mouth every morning.  ezetimibe-simvastatin (VYTORIN) 10-20 MG per tablet Take 1 tablet by mouth at bedtime.  ferrous sulfate ([employer] IRON) 325 (65 FE) MG tablet Take 325 mg by mouth daily with breakfast.  hydrochlorothiazide (MICROZIDE) 12.5 MG capsule TAKE ONE CAPSULE BY MOUTH EVERY DAY  HYDROcodone-acetaminophen (NORCO) 5-325 MG per tablet Take 1-2 tablets by mouth every 6 (six) hours as needed for pain.  nitroGLYCERIN (NITROSTAT) 0.4 MG SL tablet Place 1 tablet (0.4 mg total) under the tongue every 5 (five) minutes as needed for chest pain.  Oyster Shell (OYSTER CALCIUM) 500 MG TABS Take 500 mg of elemental calcium by mouth 2 (two) times daily.  Tamsulosin HCl (FLOMAX) 0.4 MG CAPS Take 0.4 mg by mouth at bedtime.     Allergies:  Allergies  Allergen Reactions  . Risedronate Sodium Other (See Comments)    Reaction=unknown    History   Social History  . Marital Status: Married    Spouse Name: N/A    Number of Children: N/A  . Years of Education: N/A   Occupational History  . [employer]     retired   Social History Main Topics  . Smoking status: Former Smoker -- 1.00 packs/day for 45 years    Types: Cigarettes  Quit date: 07/19/1996  . Smokeless tobacco: Never Used  . Alcohol Use: No  . Drug Use: No  . Sexual Activity: Not on file   Other Topics Concern  . Not on file   Social History Narrative   Lives with wife, generally exercises regularly.    Family Status  Relation Status Death Age  . Mother Deceased 3    No known CAD  . Father Deceased 74    No known CAD  . Sister Alive     Review of Systems:   Full 14-point review of systems otherwise negative except as noted above.  Physical Exam: Blood pressure 99/69, pulse 55, temperature 98 F (36.7 C), temperature source Oral, resp. rate 19, SpO2 97.00%. General: Well developed, well nourished,male in no acute distress. Head: Normocephalic, atraumatic, sclera non-icteric, no xanthomas, nares  are without discharge. Dentition: good Neck: No carotid bruits (murmur radiates to carotids bilaterally). JVD not elevated. No thyromegally Lungs: Good expansion bilaterally. without wheezes or rhonchi. Some dry basilar rales Heart: Regular rate and rhythm with S1 S2.  No S3 or S4.  No murmur, no rubs, or gallops appreciated. Abdomen: Soft, non-tender, non-distended with normoactive bowel sounds. No hepatomegaly. No rebound/guarding. No obvious abdominal masses. Msk:  Strength and tone appear normal for age. No joint deformities or effusions, no spine or costo-vertebral angle tenderness. Extremities: No clubbing or cyanosis. No edema.  Distal pedal pulses are 2+ in 4 extrem Neuro: Alert and oriented X 3. Moves all extremities spontaneously. No focal deficits noted. Psych:  Responds to questions appropriately with a normal affect. Skin: No rashes or lesions noted  Labs:   Lab Results  Component Value Date   WBC 7.2 05/03/2012   HGB 12.6* 11/16/2012   HCT 37.0* 11/16/2012   MCV 90.2 05/03/2012   PLT 183 05/03/2012     Recent Labs Lab 11/16/12 1410  NA 142  K 4.3  CL 105  BUN 32*  CREATININE 1.60*  GLUCOSE 97    Recent Labs  11/16/12 1408  TROPIPOC 0.01    Radiology/Studies: Dg Chest Portable 1 View 11/16/2012   CLINICAL DATA:  Mid chest pressure.  No shortness of Breath.  EXAM: PORTABLE CHEST - 1 VIEW  COMPARISON:  05/03/2012  FINDINGS: Cardiac silhouette is normal in size. The aorta is mildly uncoiled. No mediastinal or hilar masses are noted.  The lungs are clear.  No pleural effusion or pneumothorax.  The bony thorax is demineralized but intact.  IMPRESSION: No acute cardiopulmonary disease.   Electronically Signed   By: Amie Portland M.D.   On: 11/16/2012 13:05     GXT MV: 05/23/2012 Overall Impression: Intermediate stress nuclear study. Fixed, large, severe mid to apical anterior, apical inferior, mid to apical anteroseptal, and true apex perfusion defect. This suggests  prior infarction with minimal peri-infarct ischemia.  LV Ejection Fraction: 44%. LV Wall Motion: Peri-apical akinesis.  Echo: 12/07/2010 Study Conclusions - Left ventricle: The cavity size was normal. There was mild focal basal and mild concentric hypertrophy of the septum. Systolic function was moderately reduced. The estimated ejection fraction was in the range of 35% to 40%. There is akinesis of the mid-distalanteroseptal, inferoseptal, and apical myocardium. There is akinesis of the distalinferior myocardium. Doppler parameters are consistent with abnormal left ventricular relaxation (grade 1 diastolic dysfunction). - Aortic valve: Cusp separation was reduced. Valve mobility was restricted. There was mild to moderate stenosis. Mild regurgitation. Mean gradient: 21mm Hg (S). Peak gradient: 38mm Hg (S). - Mitral valve:  Mildly calcified annulus. Mildly thickened leaflets . - Left atrium: The atrium was mildly dilated. - Right atrium: The atrium was mildly dilated.  ECG: 06-Nov-2012 12:41:10  Sinus rhythm Left anterior fascicular block Anterior infarct, old Vent. rate 57 BPM PR interval 169 ms QRS duration 104 ms QT/QTc 433/426 ms P-R-T axes 46 -57 76  ASSESSMENT AND PLAN:  Principal Problem:   Unstable angina - admit, r/o MI. If enzymes positive, cath. If enzymes negative, MD advise on cath vs stress test. DVT lovenox, change to heparin and add nitrates if enzymes elevate.  Active Problems:   Hypertension - follow on home Rx   Renal insufficiency - at baseline, follow   Aortic stenosis - last echo 2 years ago, will recheck   Hypercholesteremia - No recent check, will order for am.   Signed, Theodore Demark, PA-C 11/16/2012 3:23 PM Beeper [phone]    The patient was seen, examined and discussed with Theodore Demark, PA-C and I agree with the above.   In summary, Mr Ritchey is a patient with known CAD with PCI to the prox LAD in 1998 and a cath in 2010 showing  non-obstructive CAD and EF 45%. His echocardiogram 2 years ago showed moderate aortic stenosis. The patient is coming with chest pain that started at rest, radiating to his jaw and was relieved by NTG. He is very active, exercising daily, usually swimming. He admits to worsening dyspnea on exertion recently. ECG is unchanged and the first troponin is negative. We will rule out ACS and order echo to evaluate wall motion abnormalities but mostly the severity of the aortic stenosis. On the physical exam he has a loud holosystolic murmur, very soft but still present S2, suggesting that the severity of the aortic stenosis is not critical.  Tobias Alexander, H 11/16/2012

## 2012-11-16 NOTE — ED Notes (Signed)
3-D echo in process at bedside.

## 2012-11-16 NOTE — ED Provider Notes (Signed)
CSN: [id number]     Arrival date & time 11/16/12  1244 History   First MD Initiated Contact with Patient 11/16/12 1246     Chief Complaint  Patient presents with  . Chest Pain    HPI Approximately one hour ago patient was sitting at his computer when he developed jaw pain which radiated into his chest.  Patient took nitroglycerin and aspirin.  Pain is now resolved.  Patient has previous history of MI and has 2 stents placed 16 years ago.  Has not taken nitroglycerin in several years for chest pain.  Patient otherwise feels is back to baseline is currently experiencing no shortness of breath, chest pain, diaphoresis, nausea. Past Medical History  Diagnosis Date  . Myocardial infarction 1998  . Coronary artery disease     w remote anterior myocardial infarction  . Hypertension   . Renal insufficiency   . Aortic stenosis   . Rectal polyp   . Hypercholesteremia   . Kidney stones   . 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 Hypercalcemia 12/26/2011  . Afib    Past Surgical History  Procedure Laterality Date  . Cardiac catheterization  08/18/2008    LMain 20, LAD stent 20 ISR, CFX 10, RCA 20, EF 45  . Umbilical hernia repair    . Inguinal hernia repair      left  . Orif femur fracture      right  . Cataract extraction    . Coronary stent placement  07/19/1996    Proximal LAD  . Parathyroidectomy     No family history on file. History  Substance Use Topics  . Smoking status: Former Smoker -- 1.00 packs/day for 45 years    Types: Cigarettes    Quit date: 07/19/1996  . Smokeless tobacco: Never Used  . Alcohol Use: No    Review of Systems  All other systems reviewed and are negative.    Allergies  Risedronate sodium  Home Medications   Current Outpatient Rx  Name  Route  Sig  Dispense  Refill  . aspirin 325 MG EC tablet   Oral   Take 325 mg by mouth every morning.          . enalapril (VASOTEC) 20 MG tablet   Oral   Take 20 mg by mouth every morning.         .  ezetimibe-simvastatin (VYTORIN) 10-20 MG per tablet   Oral   Take 1 tablet by mouth at bedtime.   90 tablet   3   . ferrous sulfate ([employer] IRON) 325 (65 FE) MG tablet   Oral   Take 325 mg by mouth daily with breakfast.         . HYDROcodone-acetaminophen (NORCO) 5-325 MG per tablet   Oral   Take 1-2 tablets by mouth every 6 (six) hours as needed for pain.   20 tablet   0   . nitroGLYCERIN (NITROSTAT) 0.4 MG SL tablet   Sublingual   Place 1 tablet (0.4 mg total) under the tongue every 5 (five) minutes as needed for chest pain.   25 tablet   3   . Oyster Shell (OYSTER CALCIUM) 500 MG TABS   Oral   Take 500 mg of elemental calcium by mouth 2 (two) times daily.         . Tamsulosin HCl (FLOMAX) 0.4 MG CAPS   Oral   Take 0.4 mg by mouth at bedtime.          Marland Kitchen  hydrochlorothiazide (MICROZIDE) 12.5 MG capsule      TAKE ONE CAPSULE BY MOUTH EVERY DAY   90 capsule   3    BP 109/67  Pulse 54  Temp(Src) 97.6 F (36.4 C) (Oral)  Resp 8  Wt 206 lb 8 oz (93.668 kg)  BMI 28.81 kg/m2  SpO2 97% Physical Exam  Nursing note and vitals reviewed. Constitutional: He is oriented to person, place, and time. He appears well-developed and well-nourished. No distress.  HENT:  Head: Normocephalic and atraumatic.  Eyes: Pupils are equal, round, and reactive to light.  Neck: Normal range of motion.  Cardiovascular: Normal rate and intact distal pulses.  Exam reveals no gallop and no friction rub.   Murmur heard. Pulmonary/Chest: No respiratory distress.  Abdominal: Normal appearance. He exhibits no distension.  Musculoskeletal: Normal range of motion.  Neurological: He is alert and oriented to person, place, and time. No cranial nerve deficit.  Skin: Skin is warm and dry. No rash noted.  Psychiatric: He has a normal mood and affect. His behavior is normal.    ED Course  Procedures (including critical care time) Labs Review Labs Reviewed  COMPREHENSIVE METABOLIC PANEL -  Abnormal; Notable for the following:    Glucose, Bld 116 (*)    BUN 30 (*)    Creatinine, Ser 1.53 (*)    Albumin 3.3 (*)    GFR calc non Af Amer 41 (*)    GFR calc Af Amer 47 (*)    All other components within normal limits  HEMOGLOBIN A1C - Abnormal; Notable for the following:    Hemoglobin A1C 6.2 (*)    Mean Plasma Glucose 131 (*)    All other components within normal limits  POCT I-STAT, CHEM 8 - Abnormal; Notable for the following:    BUN 32 (*)    Creatinine, Ser 1.60 (*)    Hemoglobin 12.6 (*)    HCT 37.0 (*)    All other components within normal limits  TROPONIN I  TROPONIN I  TROPONIN I  LIPID PANEL  POCT I-STAT TROPONIN I   Imaging Review No results found.  EKG Interpretation     Date: 11/16/2012  Rate: 57  Rhythm: normal sinus rhythm  QRS Axis: normal  Intervals: normal  ST/T Wave abnormalities: normal  Conduction Disutrbances: Left anterior fascicular block  Narrative Interpretation: Abnormal EKG      MDM   1. Unstable angina   2. Aortic stenosis   3. Chronic systolic CHF (congestive heart failure)   4. Coronary artery disease        Nelia Shi, MD 11/20/12 [phone]

## 2012-11-17 ENCOUNTER — Other Ambulatory Visit: Payer: Self-pay | Admitting: Family Medicine

## 2012-11-17 DIAGNOSIS — I2510 Atherosclerotic heart disease of native coronary artery without angina pectoris: Secondary | ICD-10-CM

## 2012-11-17 LAB — COMPREHENSIVE METABOLIC PANEL
Albumin: 3.3 g/dL — ABNORMAL LOW (ref 3.5–5.2)
BUN: 30 mg/dL — ABNORMAL HIGH (ref 6–23)
Calcium: 9 mg/dL (ref 8.4–10.5)
Chloride: 105 mEq/L (ref 96–112)
GFR calc Af Amer: 47 mL/min — ABNORMAL LOW (ref >90)
GFR calc non Af Amer: 41 mL/min — ABNORMAL LOW (ref >90)
Glucose, Bld: 116 mg/dL — ABNORMAL HIGH (ref 70–99)
Sodium: 140 mEq/L (ref 135–145)
Total Protein: 6.6 g/dL (ref 6.0–8.3)

## 2012-11-17 LAB — LIPID PANEL
LDL Cholesterol: 58 mg/dL (ref 0–99)
Total CHOL/HDL Ratio: 2.5 RATIO
VLDL: 21 mg/dL (ref 0–40)

## 2012-11-17 LAB — TROPONIN I: Troponin I: <0.3 ng/mL (ref <0.30)

## 2012-11-17 LAB — HEMOGLOBIN A1C: Hgb A1c MFr Bld: 6.2 % — ABNORMAL HIGH (ref <5.7)

## 2012-11-17 NOTE — Progress Notes (Signed)
Patient Name: Jonathan Acevedo Date of Encounter: 11/17/2012     Principal Problem:   Unstable angina Active Problems:   Hypertension   Renal insufficiency   Aortic stenosis   Hypercholesteremia    SUBJECTIVE  *The patient feels well, no more chest pain.  CURRENT MEDS . aspirin  325 mg Oral q morning - 10a  . calcium carbonate  1 tablet Oral BID WC  . enalapril  20 mg Oral q morning - 10a  . enoxaparin (LOVENOX) injection  40 mg Subcutaneous Q24H  . ezetimibe-simvastatin  1 tablet Oral QHS  . ferrous sulfate  325 mg Oral Q breakfast  . hydrochlorothiazide  12.5 mg Oral Daily  . sodium chloride  3 mL Intravenous Q12H  . tamsulosin  0.4 mg Oral QHS    OBJECTIVE  Filed Vitals:   11/16/12 1745 11/16/12 1819 11/16/12 2100 11/17/12 0500  BP: 127/71 140/84 123/72 137/71  Pulse: 58 93 58 54  Temp:   97.9 F (36.6 C) 97.6 F (36.4 C)  TempSrc:  Oral    Resp: 24 20 20 8   Weight:    206 lb 8 oz (93.668 kg)  SpO2: 99% 95% 98% 97%    Intake/Output Summary (Last 24 hours) at 11/17/12 0955 Last data filed at 11/16/12 2146  Gross per 24 hour  Intake      3 ml  Output      0 ml  Net      3 ml   Filed Weights   11/17/12 0500  Weight: 206 lb 8 oz (93.668 kg)    PHYSICAL EXAM  General: Pleasant, NAD. Neuro: Alert and oriented X 3. Moves all extremities spontaneously. Psych: Normal affect. HEENT:  Normal  Neck: Supple without bruits or JVD. Lungs:  Resp regular and unlabored, CTA. Heart: RRR no s3, s4, or murmurs. Abdomen: Soft, non-tender, non-distended, BS + x 4.  Extremities: No clubbing, cyanosis or edema. DP/PT/Radials 2+ and equal bilaterally.  Accessory Clinical Findings  CBC  Recent Labs  11/16/12 1410  HGB 12.6*  HCT 37.0*   Basic Metabolic Panel  Recent Labs  11/16/12 1410 11/17/12 0110  NA 142 140  K 4.3 3.8  CL 105 105  CO2  --  25  GLUCOSE 97 116*  BUN 32* 30*  CREATININE 1.60* 1.53*  CALCIUM  --  9.0   Liver Function  Tests  Recent Labs  11/17/12 0110  AST 14  ALT 9  ALKPHOS 61  BILITOT 0.3  PROT 6.6  ALBUMIN 3.3*   No results found for this basename: LIPASE, AMYLASE,  in the last 72 hours Cardiac Enzymes  Recent Labs  11/16/12 1932 11/17/12 0110 11/17/12 0820  TROPONINI <0.30 <0.30 <0.30     Recent Labs  11/16/12 1932  HGBA1C 6.2*   Fasting Lipid Panel  Recent Labs  11/17/12 0110  CHOL 133  HDL 54  LDLCALC 58  TRIG 105  CHOLHDL 2.5    TELE  SR, 60/minute  Radiology/Studies  Dg Chest Portable 1 View  11/16/2012   CLINICAL DATA:  Mid chest pressure.  No shortness of Breath.  EXAM: PORTABLE CHEST - 1 VIEW  COMPARISON:  05/03/2012  FINDINGS: Cardiac silhouette is normal in size. The aorta is mildly uncoiled. No mediastinal or hilar masses are noted.  The lungs are clear.  No pleural effusion or pneumothorax.  The bony thorax is demineralized but intact.  IMPRESSION: No acute cardiopulmonary disease.   Electronically Signed   By: Onalee Hua  Ormond M.D.   On: 11/16/2012 13:05    ASSESSMENT AND PLAN  1. CAD - known CAD with PCI to the prox LAD in 1998 and a cath in 2010 showing non-obstructive CAD and EF 45%. Troponin negative x 3, echo doesn't show any new wall motion abnormalities. We will discharge him and perform an outpatient exercise nuclear scan.  2. Aortic stenosis - the findings on the echo yesterday still show moderate AS with mean gradient 23 mmHg, AVA 1.3 cm2.   3. Hyperlipidemia - WNL on current regimen  4. Hypertension - controlled     Signed, Tobias Alexander, H MD

## 2012-11-17 NOTE — Progress Notes (Signed)
Utilization Review Completed.Jonathan Acevedo T10/01/2013  

## 2012-11-17 NOTE — Discharge Summary (Signed)
CARDIOLOGY DISCHARGE SUMMARY   Patient ID: Jonathan Acevedo MRN: [id number] DOB/AGE: 1928-09-21 79 y.o.  Admit date: 11/16/2012 Discharge date: 11/17/2012  Primary Discharge Diagnosis:    Unstable angina - medical therapy for coronary artery disease and outpatient stress testing recommend Secondary Discharge Diagnosis:    Hypertension   Renal insufficiency   Aortic stenosis   Hypercholesteremia  Procedures:  2-D echocardiogram  Hospital Course: Jonathan Acevedo is a 79 y.o. male with a history of CAD. He had prolonged chest pain and came to the hospital with where his was admitted for further evaluation and treatment.  His symptoms were concerning for anginal pain. 2-D echocardiogram was performed and showed no change in his aortic stenosis and no new wall motion abnormalities. He was treated medically and his symptoms improved.  Overnight, he had no further episodes of chest pain. His cardiac enzymes were negative for MI. His other labs were reviewed and showed no critical abnormalities. His chest x-ray showed no acute disease. His BUN and creatinine are up slightly but this is consistent with previous values.  On 11/17/2012, Jonathan Acevedo was seen by Dr. Delton See. His echocardiogram, chest x-ray and other data were reviewed. No further inpatient workup was indicated at this time. Jonathan Acevedo was ambulating without chest pain or shortness of breath and considered stable for discharge, to follow up in the office with an outpatient stress test and followup visit.    Labs:   Lab Results  Component Value Date   WBC 7.2 05/03/2012   HGB 12.6* 11/16/2012   HCT 37.0* 11/16/2012   MCV 90.2 05/03/2012   PLT 183 05/03/2012     Recent Labs Lab 11/17/12 0110  NA 140  K 3.8  CL 105  CO2 25  BUN 30*  CREATININE 1.53*  CALCIUM 9.0  PROT 6.6  BILITOT 0.3  ALKPHOS 61  ALT 9  AST 14  GLUCOSE 116*    Recent Labs  11/16/12 1932 11/17/12 0110 11/17/12 0820  TROPONINI <0.30 <0.30 <0.30     Lipid Panel     Component Value Date/Time   CHOL 133 11/17/2012 0110   TRIG 105 11/17/2012 0110   HDL 54 11/17/2012 0110   CHOLHDL 2.5 11/17/2012 0110   VLDL 21 11/17/2012 0110   LDLCALC 58 11/17/2012 0110      Radiology: Dg Chest Portable 1 View 11/16/2012   CLINICAL DATA:  Mid chest pressure.  No shortness of Breath.  EXAM: PORTABLE CHEST - 1 VIEW  COMPARISON:  05/03/2012  FINDINGS: Cardiac silhouette is normal in size. The aorta is mildly uncoiled. No mediastinal or hilar masses are noted.  The lungs are clear.  No pleural effusion or pneumothorax.  The bony thorax is demineralized but intact.  IMPRESSION: No acute cardiopulmonary disease.   Electronically Signed   By: Amie Portland M.D.   On: 11/16/2012 13:05   EKG: 11/16/2012 Sinus bradycardia Vent. rate 57 BPM PR interval 169 ms QRS duration 104 ms QT/QTc 433/426 ms P-R-T axes 46 -57 76  Echo: 11/16/2012 Conclusions - Left ventricle: There is akinesis of the mid anteroseptal, distal septal and inferior wall and true apex. The cavity size was normal. There was mild concentric hypertrophy. Systolic function was mildly to moderately reduced. The estimated ejection fraction was in the range of 40% to 45%. Doppler parameters are consistent with abnormal left ventricular relaxation (grade 1 diastolic dysfunction). - Aortic valve: Cusp separation was reduced. Valve mobility was restricted. There was moderate stenosis. Mild  regurgitation. Valve area: 1.3 cm^2 (0.7 cm2/m2).  Mean gradient: 26mm Hg (S). Peak gradient: 39mm Hg (S) - Left atrium: The atrium was mildly dilated. - Right ventricle: The cavity size was mildly dilated. - Right atrium: The atrium was mildly dilated. - Atrial septum: A septal defect cannot be excluded. Impressions: - The findings are unchanged from the prior study on 12/07/2010. There are no new wall motion abnormalities and the severity of the aortic valve is still moderate. Normal right sided  pressures.  FOLLOW UP PLANS AND APPOINTMENTS Allergies  Allergen Reactions  . Risedronate Sodium Other (See Comments)    Reaction=unknown     Medication List         aspirin 325 MG EC tablet  Take 325 mg by mouth every morning.     [facility] IRON 325 (65 FE) MG tablet  Generic drug:  ferrous sulfate  Take 325 mg by mouth daily with breakfast.     enalapril 20 MG tablet  Commonly known as:  VASOTEC  Take 20 mg by mouth every morning.     ezetimibe-simvastatin 10-20 MG per tablet  Commonly known as:  VYTORIN  Take 1 tablet by mouth at bedtime.     hydrochlorothiazide 12.5 MG capsule  Commonly known as:  MICROZIDE  TAKE ONE CAPSULE BY MOUTH EVERY DAY     HYDROcodone-acetaminophen 5-325 MG per tablet  Commonly known as:  NORCO  Take 1-2 tablets by mouth every 6 (six) hours as needed for pain.     nitroGLYCERIN 0.4 MG SL tablet  Commonly known as:  NITROSTAT  Place 1 tablet (0.4 mg total) under the tongue every 5 (five) minutes as needed for chest pain.     oyster calcium 500 MG Tabs tablet  Take 500 mg of elemental calcium by mouth 2 (two) times daily.     tamsulosin 0.4 MG Caps capsule  Commonly known as:  FLOMAX  Take 0.4 mg by mouth at bedtime.        Discharge Orders   Future Orders Complete By Expires   Diet - low sodium heart healthy  As directed    Increase activity slowly  As directed      Follow-up Information   Follow up with [facility] (The office will call)    Contact information:   [address] [place] [address]       BRING ALL MEDICATIONS WITH YOU TO FOLLOW UP APPOINTMENTS  Time spent with patient to include physician time: 38 min Signed: Theodore Demark, PA-C 11/17/2012, 11:32 AM Co-Sign MD

## 2012-11-17 NOTE — Discharge Summary (Signed)
Jonathan Acevedo, Jonathan Acevedo 11/17/2012

## 2012-11-18 ENCOUNTER — Other Ambulatory Visit: Payer: Self-pay | Admitting: Family Medicine

## 2012-11-18 ENCOUNTER — Ambulatory Visit (INDEPENDENT_AMBULATORY_CARE_PROVIDER_SITE_OTHER): Payer: Medicare Other | Admitting: Internal Medicine

## 2012-11-18 VITALS — BP 98/58 | HR 58 | Temp 97.6°F | Resp 16 | Ht 70.0 in | Wt 211.4 lb

## 2012-11-18 DIAGNOSIS — I872 Venous insufficiency (chronic) (peripheral): Secondary | ICD-10-CM

## 2012-11-18 MED ORDER — HYDROCHLOROTHIAZIDE 12.5 MG PO CAPS: ORAL_CAPSULE | ORAL | Status: DC

## 2012-11-18 NOTE — Telephone Encounter (Signed)
PT CALLED HIMSELF AND WANTED TO MAKE SURE WE GIVE HIM A 90 DAY SUPPLY INSTEAD OF THE 30, WILL COST HIM A LOT LESS IF IT IS A 90 DAY SUPPLY NEED TO HAVE A REFILL ON THE MEDICINE HE TAKES FOR RETENTION OF BODILY FRU IDS. PLEASE CALL [phone]

## 2012-11-18 NOTE — Progress Notes (Addendum)
Subjective:    Patient ID: Jonathan Acevedo, male    DOB: 07/29/1928, 79 y.o.   MRN: [id number]  HPI  HPI Comments: Jonathan Acevedo is a 79 y.o. male who presents to the [facility] complaining of needing a refill for his fluid pill to control his lower extremity edema. Pt was recently discharged from hospital following anginal episode. Patient Active Problem List   Diagnosis Date Noted  . Unstable angina 11/16/2012  . Chronic venous insufficiency 07/29/2012  . Precordial pain - MI ruled out, outpatient stress test planned 05/04/2012  . 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 Hypercalcemia 12/26/2011  . Chronic systolic CHF (congestive heart failure) 07/03/2011  . Primary hyperparathyroidism 07/03/2011  . Ischemic cardiomyopathy 11/29/2010  . Myocardial infarction   . Coronary artery disease   . Hypertension   . Renal insufficiency   . Aortic stenosis   . Rectal polyp   . Hypercholesteremia    Current outpatient prescriptions:aspirin 325 MG EC tablet, Take 325 mg by mouth every morning. , Disp: , Rfl: ;  enalapril (VASOTEC) 20 MG tablet, Take 20 mg by mouth every morning., Disp: , Rfl: ;  ezetimibe-simvastatin (VYTORIN) 10-20 MG per tablet, Take 1 tablet by mouth at bedtime., Disp: 90 tablet, Rfl: 3;  ferrous sulfate (CVS IRON) 325 (65 FE) MG tablet, Take 325 mg by mouth daily with breakfast., Disp: , Rfl:  hydrochlorothiazide (MICROZIDE) 12.5 MG capsule, TAKE ONE CAPSULE BY MOUTH EVERY DAY, Disp: 30 capsule, Rfl: 1;  hydrochlorothiazide (MICROZIDE) 12.5 MG capsule, TAKE ONE CAPSULE BY MOUTH EVERY DAY, Disp: 90 capsule, Rfl: 0;  HYDROcodone-acetaminophen (NORCO) 5-325 MG per tablet, Take 1-2 tablets by mouth every 6 (six) hours as needed for pain., Disp: 20 tablet, Rfl: 0 nitroGLYCERIN (NITROSTAT) 0.4 MG SL tablet, Place 1 tablet (0.4 mg total) under the tongue every 5 (five) minutes as needed for chest pain., Disp: 25 tablet, Rfl: 3;  Oyster Shell (OYSTER CALCIUM) 500 MG TABS,  Take 500 mg of elemental calcium by mouth 2 (two) times daily., Disp: , Rfl: ;  Tamsulosin HCl (FLOMAX) 0.4 MG CAPS, Take 0.4 mg by mouth at bedtime. , Disp: , Rfl:   Pt was placed on hctz spring 2014 by Dr. Conley Rolls. This was controlling the lower extremity edema secondary to venus insuffiency and pt requests a refill. He was very frustrated that no one would refill it for him without OV. Says his pcp wouldn't refill because she didn't prescribe it. He is also angry that he has to see multiple doctors. His pcp is Dr. Herb Grays. Vascular surgery unsure re next approach at his age. He's frustrated with their recent eval and has decided to stop going.   He swims daily and feels like his legs do not compromise anything he wants to do. No numbness or weakness. No infection.  Review of Systems Stable since discharge    Objective:   Physical Exam BP 98/58  Pulse 58  Temp(Src) 97.6 F (36.4 C) (Oral)  Resp 16  Ht 5\' 10"  (1.778 m)  Wt 211 lb 6.4 oz (95.89 kg)  BMI 30.33 kg/m2  SpO2 95% NAD Ht reg w/ sys ejec M as noted Legs w/ trace edema. Marked hyperpigmentation No open wounds. Pulses dimished D.P. bilat but no loss sensation. No jt Abnormality     Assessment & Plan:  Chronic venous insufficiency w/ edema numerous other problems  Meds ordered this encounter  Medications  . hydrochlorothiazide (MICROZIDE) 12.5 MG capsule  Sig: TAKE ONE CAPSULE BY MOUTH EVERY DAY    Dispense:  90 capsule    Refill:  3  he has compr stockings To f/u as planned w/ cardiology and PCP   I have completed the patient encounter in its entirety as documented by the scribe, with editing by me where necessary. Ladan Vanderzanden P. Merla Riches, M.D.

## 2012-11-18 NOTE — Telephone Encounter (Signed)
Pt called and needs a 90 day refill of his fluid retention medication.  [facility] [address]  Please call [phone]

## 2012-11-18 NOTE — Telephone Encounter (Signed)
He needs visit. What is his plan? Called him advised 90day supply sent but he needs appt. Before this runs out

## 2012-11-28 ENCOUNTER — Telehealth: Payer: Self-pay | Admitting: Cardiology

## 2012-11-28 NOTE — Telephone Encounter (Signed)
Pt was scheduled for nuc stress test for 11-29-12 from after hours vm from hosp visit for unstable angina.  Pt was not aware of this appt until nuclear dept called pt to discuss pretesting instructions.  Pt was also unaware of post hosp f/u appt w/Lori Tyrone Sage, PA on 12-06-12.  Pt concerned that his insurance won't pay for another nuc test this year as he had one in April.  I explained to pt that we could not assure 100% that his insurance would pay for test as no insurance will tell you whether they will pay until the claim actually is sent to them.  I did explain to the pt we recommended he have this test since he does have CAD and he had CP severe enough that prompted his to go to the ER since his last nuclear test.  Pt is unable to come in for test since short notice.  I spoke w/Cheryl Pugh, LPN and she stated it would be ok to wait until appt w/Lori Gerhardt on 12-06-12 to discuss this as long as he wasn't having any problems.  If pt does develop any problems he needs to go to the ER.  Pt understands and agrees.

## 2012-11-29 ENCOUNTER — Encounter (HOSPITAL_COMMUNITY): Payer: Medicare Other

## 2012-12-01 NOTE — Telephone Encounter (Signed)
Would let Dr. Swaziland know as well.

## 2012-12-04 ENCOUNTER — Other Ambulatory Visit: Payer: Self-pay

## 2012-12-04 MED ORDER — EZETIMIBE-SIMVASTATIN 10-20 MG PO TABS: 1.0000 | ORAL_TABLET | Freq: Every day | ORAL | Status: DC

## 2012-12-06 ENCOUNTER — Encounter: Payer: Self-pay | Admitting: Vascular Surgery

## 2012-12-06 ENCOUNTER — Encounter: Payer: Self-pay | Admitting: Nurse Practitioner

## 2012-12-06 ENCOUNTER — Ambulatory Visit (INDEPENDENT_AMBULATORY_CARE_PROVIDER_SITE_OTHER): Payer: Medicare Other | Admitting: Nurse Practitioner

## 2012-12-06 VITALS — BP 128/78 | HR 62 | Ht 71.0 in | Wt 210.1 lb

## 2012-12-06 DIAGNOSIS — I2510 Atherosclerotic heart disease of native coronary artery without angina pectoris: Secondary | ICD-10-CM

## 2012-12-06 DIAGNOSIS — I359 Nonrheumatic aortic valve disorder, unspecified: Secondary | ICD-10-CM

## 2012-12-06 DIAGNOSIS — I350 Nonrheumatic aortic (valve) stenosis: Secondary | ICD-10-CM

## 2012-12-06 NOTE — Patient Instructions (Addendum)
Stay on your current medicines  See Dr. Swaziland in 4 months  Try to get back to your swimming   Let us know if you have any more chest pain   Call the [facility] office at [phone] if you have any questions, problems or concerns.

## 2012-12-06 NOTE — Progress Notes (Signed)
Jonathan Acevedo Date of Birth: 1928-12-28 Medical Record #[id number]  History of Present Illness: Jonathan Acevedo is seen back today for a post hospital visit. Seen for Dr. Swaziland. Jonathan Acevedo has a history of CAD with remote MI and prior stent to the LAD, HTN, CKD, AS and HLD. Jonathan Acevedo was hospitalized briefly in early April of 2014 with chest pain. A subsequent stress Myoview study showed extensive scar of the anterior apex and inferior wall. Ejection fraction is 44%. This actually represented an improvement from echocardiogram in 2012 at which time his ejection fraction was 35-40%.   Has seen [facility] back in June and discussion of significant swelling bilaterally. Jonathan Acevedo had bulging varicosities. Jonathan Acevedo had had one episode of severe bleeding requiring a trip to the emergency department. The patient was given a prescription for thigh-high 20-30 mm compression stockings. Referred to Dr. Hart Rochester for laser ablation in the future.   Most recently presented with a prolonged episode of chest pain. Echo showed no change in his AS - medical management was continued. Cardiac enzymes were negative. Stress testing was recommended (but Jonathan Acevedo has already had this earlier year).   Comes back today. Here alone. Doing ok. Feeling better. Thinks that this latest episode was more stress related. Jonathan Aceved[place]. Jonathan Acevedo is stressed about his retirement benefits/insurance. Wife is sick. Stopped going to the pool. Jonathan Acevedo has had no more chest pain. Jonathan Acevedo is planning on going back to pool today. Not dizzy or lightheaded. Jonathan Acevedo has cut his medicines back since Jonathan Acevedo was last here due to dizziness and BP being too low. Wearing his support stockings. Swelling does not seem to be too bothersome to him at this time.    Current Outpatient Prescriptions  Medication Sig Dispense Refill  . aspirin 325 MG EC tablet Take 325 mg by mouth every morning.       . enalapril (VASOTEC) 20 MG tablet Take 5 mg by mouth every morning.       . ezetimibe-simvastatin  (VYTORIN) 10-20 MG per tablet Take 1 tablet by mouth at bedtime.  90 tablet  3  . ferrous sulfate (CVS IRON) 325 (65 FE) MG tablet Take 325 mg by mouth daily with breakfast.      . hydrochlorothiazide (MICROZIDE) 12.5 MG capsule Take 12.5 mg by mouth daily. Patient states takes prn      . HYDROcodone-acetaminophen (NORCO) 5-325 MG per tablet Take 1-2 tablets by mouth every 6 (six) hours as needed for pain.  20 tablet  0  . nitroGLYCERIN (NITROSTAT) 0.4 MG SL tablet Place 1 tablet (0.4 mg total) under the tongue every 5 (five) minutes as needed for chest pain.  25 tablet  3  . Oyster Shell (OYSTER CALCIUM) 500 MG TABS Take 500 mg of elemental calcium by mouth 2 (two) times daily.      . Tamsulosin HCl (FLOMAX) 0.4 MG CAPS Take 0.4 mg by mouth at bedtime.        No current facility-administered medications for this visit.    Allergies  Allergen Reactions  . Risedronate Sodium Other (See Comments)    Reaction=unknown    Past Medical History  Diagnosis Date  . Myocardial infarction 1998  . Coronary artery disease     w remote anterior myocardial infarction  . Hypertension   . Renal insufficiency   . Aortic stenosis   . Rectal polyp   . Hypercholesteremia   . Kidney stones   . 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  Hypercalcemia 12/26/2011  . Afib     Past Surgical History  Procedure Laterality Date  . Cardiac catheterization  08/18/2008    LMain 20, LAD stent 20 ISR, CFX 10, RCA 20, EF 45  . Umbilical hernia repair    . Inguinal hernia repair      left  . Orif femur fracture      right  . Cataract extraction    . Coronary stent placement  07/19/1996    Proximal LAD  . Parathyroidectomy      History  Smoking status  . Former Smoker -- 1.00 packs/day for 45 years  . Types: Cigarettes  . Quit date: 07/19/1996  Smokeless tobacco  . Never Used    History  Alcohol Use No    No family history on file.  Review of Systems: The review of systems is per the HPI.  All other  systems were reviewed and are negative.  Physical Exam: BP 128/78  Pulse 62  Ht 5\' 11"  (1.803 m)  Wt 210 lb 1.9 oz (95.31 kg)  BMI 29.32 kg/m2  SpO2 95% Patient is very pleasant and in no acute distress. Skin is warm and dry. Color is normal.  HEENT is unremarkable. Normocephalic/atraumatic. PERRL. Sclera are nonicteric. Neck is supple. No masses. No JVD. Lungs are clear. Cardiac exam shows a regular rate and rhythm. Harsh outflow murmur. Abdomen is soft. Extremities are full with brawny stasis and 1+ edema. Gait and ROM are intact. No gross neurologic deficits noted.  LABORATORY DATA:   Chemistry      Component Value Date/Time   NA 140 11/17/2012 0110   K 3.8 11/17/2012 0110   CL 105 11/17/2012 0110   CO2 25 11/17/2012 0110   BUN 30* 11/17/2012 0110   CREATININE 1.53* 11/17/2012 0110      Component Value Date/Time   CALCIUM 9.0 11/17/2012 0110   CALCIUM 10.5 08/17/2008 0926   ALKPHOS 61 11/17/2012 0110   AST 14 11/17/2012 0110   ALT 9 11/17/2012 0110   BILITOT 0.3 11/17/2012 0110      Lab Results  Component Value Date   WBC 7.2 05/03/2012   HGB 12.6* 11/16/2012   HCT 37.0* 11/16/2012   MCV 90.2 05/03/2012   PLT 183 05/03/2012   Lab Results  Component Value Date   CHOL 133 11/17/2012   HDL 54 11/17/2012   LDLCALC 58 11/17/2012   TRIG 105 11/17/2012   CHOLHDL 2.5 11/17/2012   Lab Results  Component Value Date   CKTOTAL 116 08/17/2008   CKMB 2.8 08/17/2008   TROPONINI <0.30 11/17/2012   Echo Study Conclusions October 2014  - Left ventricle: There is akinesis of the mid anteroseptal, distal septal and inferior wall and true apex. The cavity size was normal. There was mild concentric hypertrophy. Systolic function was mildly to moderately reduced. The estimated ejection fraction was in the range of 40% to 45%. Doppler parameters are consistent with abnormal left ventricular relaxation (grade 1 diastolic dysfunction). - Aortic valve: Cusp separation was reduced.  Valve mobility was restricted. There was moderate stenosis. Mild regurgitation. Valve area: 1.3 cm^2 (0.7 cm2/m2). - Left atrium: The atrium was mildly dilated. - Right ventricle: The cavity size was mildly dilated. - Right atrium: The atrium was mildly dilated. - Atrial septum: A septal defect cannot be excluded.   Myoview Impression from April 2014  Exercise Capacity: Fair exercise capacity. Lexiscan was given due to inability to achieve target heart rate.  BP Response: Normal blood pressure  response.  Clinical Symptoms: Back pain, dyspnea.  ECG Impression: No significant ST segment change suggestive of ischemia.  Comparison with Prior Nuclear Study: No images to compare  Overall Impression: Intermediate stress nuclear study. Fixed, large, severe mid to apical anterior, apical inferior, mid to apical anteroseptal, and true apex perfusion defect. This suggests prior infarction with minimal peri-infarct ischemia.  LV Ejection Fraction: 44%. LV Wall Motion: Peri-apical akinesis.  Marca Ancona  05/23/2012     CARDIAC CATH FINAL INTERPRETATION FROM 2010:   ANGIOGRAPHIC DATA: Left coronary artery arises and distributes  normally. There is 20% narrowing in the ostium of the left main  coronary artery. It otherwise appears normal.  The left anterior descending artery demonstrates a long segment of  stenting in the proximal vessel. This is widely patent with less than  10-20% irregularities in the stented segment. There is a 30% focal  narrowing in the mid LAD following the stent.  Left circumflex coronary artery is a large vessel and has minor  irregularities less than 10%.  The right coronary artery is a large dominant vessel. There is a 20%  narrowing in the proximal vessel with moderate calcification.  Otherwise, no significant disease is noted.  Left ventricular angiography was performed in the RAO view. This  demonstrates normal left ventricular size with mid to distal anterior    and apical akinesia. Ejection fraction is estimated 45%. There is no  significant mitral insufficiency. The aorta appears calcified, but does  not appear to be dilated.   1. Nonobstructive atherosclerotic coronary artery disease. Jonathan Acevedo has  continued patency long term of the stented segment in the proximal  left anterior descending.  2. Mild-to-moderate left ventricular dysfunction.  3. Mild aortic stenosis.  PLAN: We would recommend continue medical therapy.  ______________________________  Peter M. [place]il[place], M.D.  PMJ/MEDQ D: 08/18/2008 T: 08/18/2008 Job: [id number]   Assessment / Plan: 1. Chest pain- negative Myoview from April of 2014 - I would favor continued medical management at this time - if Jonathan Acevedo were to have recurrent chest pain - I would favor proceeding with cardiac cath given that Jonathan Acevedo had a Myoview in April. Patient is agreeable to this plan.   2. Moderate AS with mild AR - recent echo - unchanged.   3. HTN - BP is ok. I have left him on his current regimen for now.   4. Bulging varicosities - sees Dr. Hart Rochester next week - wearing his support stockings for now.   Patient is agreeable to this plan and will call if any problems develop in the interim.   Rosalio Macadamia, RN, ANP-C [facility] [address] [place], [place]  [address]

## 2012-12-09 ENCOUNTER — Ambulatory Visit (INDEPENDENT_AMBULATORY_CARE_PROVIDER_SITE_OTHER): Payer: Medicare Other | Admitting: Vascular Surgery

## 2012-12-09 ENCOUNTER — Encounter: Payer: Self-pay | Admitting: Vascular Surgery

## 2012-12-09 VITALS — BP 142/80 | HR 60 | Resp 16 | Ht 71.0 in | Wt 210.0 lb

## 2012-12-09 DIAGNOSIS — I83893 Varicose veins of bilateral lower extremities with other complications: Secondary | ICD-10-CM

## 2012-12-09 NOTE — Progress Notes (Signed)
Subjective:     Patient ID: Jonathan Acevedo, male   DOB: 1928-09-29, 79 y.o.   MRN: [id number]  HPI this 79 year old male returns for further discussion regarding his severe venous insufficiency bilaterally with chronic edema and hyperpigmentation which has developed over the past 12 months. He also has evidence of superficial thrombophlebitis in the right calf on physical examination. He does have a history of nonobstructive coronary artery disease and is followed by Dr. [place]. He had a discussion with Dr. [place] regarding bilateral saphenous vein ablation in regards to potential need for coronary artery bypass grafting. His saphenous veins may not be suitable to use as conduits and he does have other options if that should become necessary which was also Dr. Elvis Coil opinion according to the patient. He seems to be doing very well from a cardiac standpoint swelling on a regular basis and denying any chest pain or dyspnea on exertion. He has 1 long-leg elastic compression stockings 20-30 mm gradient and tried elevation with no improvement in his symptomatology or improvement in his skin quality.  Past Medical History  Diagnosis Date  . Myocardial infarction 1998  . Coronary artery disease     w remote anterior myocardial infarction  . Hypertension   . Renal insufficiency   . Aortic stenosis   . Rectal polyp   . Hypercholesteremia   . Kidney stones   . 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 Hypercalcemia 12/26/2011  . Afib     History  Substance Use Topics  . Smoking status: Former Smoker -- 1.00 packs/day for 45 years    Types: Cigarettes    Quit date: 07/19/1996  . Smokeless tobacco: Never Used  . Alcohol Use: No    History reviewed. No pertinent family history.  Allergies  Allergen Reactions  . Risedronate Sodium Other (See Comments)    Reaction=unknown    Current outpatient prescriptions:aspirin 325 MG EC tablet, Take 325 mg by mouth every morning. , Disp: , Rfl: ;  enalapril  (VASOTEC) 20 MG tablet, Take 5 mg by mouth every morning. , Disp: , Rfl: ;  ezetimibe-simvastatin (VYTORIN) 10-20 MG per tablet, Take 1 tablet by mouth at bedtime., Disp: 90 tablet, Rfl: 3;  ferrous sulfate ([employer] IRON) 325 (65 FE) MG tablet, Take 325 mg by mouth daily with breakfast., Disp: , Rfl:  hydrochlorothiazide (MICROZIDE) 12.5 MG capsule, Take 12.5 mg by mouth daily as needed. Patient states takes prn, Disp: , Rfl: ;  HYDROcodone-acetaminophen (NORCO) 5-325 MG per tablet, Take 1-2 tablets by mouth every 6 (six) hours as needed for pain., Disp: 20 tablet, Rfl: 0;  nitroGLYCERIN (NITROSTAT) 0.4 MG SL tablet, Place 1 tablet (0.4 mg total) under the tongue every 5 (five) minutes as needed for chest pain., Disp: 25 tablet, Rfl: 3 Oyster Shell (OYSTER CALCIUM) 500 MG TABS, Take 500 mg of elemental calcium by mouth 2 (two) times daily., Disp: , Rfl: ;  Tamsulosin HCl (FLOMAX) 0.4 MG CAPS, Take 0.4 mg by mouth at bedtime. , Disp: , Rfl:   BP 142/80  Pulse 60  Resp 16  Ht 5\' 11"  (1.803 m)  Wt 210 lb (95.255 kg)  BMI 29.30 kg/m2  Body mass index is 29.3 kg/(m^2).          Review of Systems denies chest pain, dyspnea on exertion, PND, orthopnea, hemoptysis, claudication.    Objective:   Physical Exam general well-developed well-nourished male no apparent stress alert and oriented x3 Lungs no rhonchi or wheezing Lower extremities both have severe  hyperpigmentation with thickening of the skin lower third but no active ulceration. There is a palpable old thrombus in the great saphenous vein on the left side between the knee and ankle. There no bulging varicosities noted. There is 1+ chronic edema.  Patient has documented gross reflux in both great saphenous systems on our previous formal venous duplex exam    Assessment:     Severe bilateral venous insufficiency with reflux great saphenous systems and chronic edema and progressive skin changes bilaterally Patient needs bilateral great  saphenous laser ablation procedures to hopefully stabilize the skin and improve the quality of the skin and decreased edema and discomfort    Plan:     Will proceed with precertification to perform staged bilateral great saphenous vein laser ablation procedures in the near future

## 2012-12-10 ENCOUNTER — Other Ambulatory Visit: Payer: Self-pay | Admitting: *Deleted

## 2012-12-10 DIAGNOSIS — I83893 Varicose veins of bilateral lower extremities with other complications: Secondary | ICD-10-CM

## 2012-12-12 ENCOUNTER — Other Ambulatory Visit: Payer: Self-pay

## 2012-12-16 ENCOUNTER — Telehealth: Payer: Self-pay

## 2012-12-16 NOTE — Telephone Encounter (Signed)
patient call about his vytorin refiils, there was a rx sent in on 10.29.for 90tab with 3 refill. I call [facility] to confrim the rx and they said that the patient had to call to get the rx, I called the patient back to let him know this

## 2012-12-18 ENCOUNTER — Other Ambulatory Visit: Payer: Self-pay | Admitting: *Deleted

## 2012-12-18 DIAGNOSIS — I83893 Varicose veins of bilateral lower extremities with other complications: Secondary | ICD-10-CM

## 2012-12-20 ENCOUNTER — Encounter: Payer: Self-pay | Admitting: Vascular Surgery

## 2012-12-23 ENCOUNTER — Ambulatory Visit (INDEPENDENT_AMBULATORY_CARE_PROVIDER_SITE_OTHER): Payer: Medicare Other | Admitting: Vascular Surgery

## 2012-12-23 ENCOUNTER — Other Ambulatory Visit: Payer: Self-pay

## 2012-12-23 ENCOUNTER — Encounter: Payer: Self-pay | Admitting: Vascular Surgery

## 2012-12-23 VITALS — BP 122/73 | HR 77 | Resp 16 | Ht 71.0 in | Wt 205.0 lb

## 2012-12-23 DIAGNOSIS — I83893 Varicose veins of bilateral lower extremities with other complications: Secondary | ICD-10-CM

## 2012-12-23 MED ORDER — HYDROCHLOROTHIAZIDE 12.5 MG PO CAPS: 12.5000 mg | ORAL_CAPSULE | Freq: Every day | ORAL | Status: DC | PRN

## 2012-12-23 NOTE — Progress Notes (Signed)
   Laser Ablation Procedure      Date: 12/23/2012    Jonathan Acevedo DOB:[birth year]  Consent signed: Yes  Surgeon:J.D. Hart [place]  Procedure: Laser Ablation: left Greater Saphenous Vein  BP 122/73  Pulse 77  Resp 16  Ht 5\' 11"  (1.803 m)  Wt 205 lb (92.987 kg)  BMI 28.60 kg/m2  Start time: 11:10   End time: 11:55  Tumescent Anesthesia: 425 cc 0.9% NaCl with 50 cc Lidocaine HCL with 1% Epi and 15 cc 8.4% NaHCO3  Local Anesthesia: 6 cc Lidocaine HCL and NaHCO3 (ratio 2:1)  Pulsed mode: 15 watts, delay, 1.0 duration Total energy: 2717, total pulses: 182, total time: 3:01     Patient tolerated procedure well: Yes  Notes:   Description of Procedure:  After marking the course of the saphenous vein and the secondary varicosities in the standing position, the patient was placed on the operating table in the supine position, and the left leg was prepped and draped in sterile fashion. Local anesthetic was administered, and under ultrasound guidance the saphenous vein was accessed with a micro needle and guide wire; then the micro puncture sheath was placed. A guide wire was inserted to the saphenofemoral junction, followed by a 5 french sheath.  The position of the sheath and then the laser fiber below the junction was confirmed using the ultrasound and visualization of the aiming beam.  Tumescent anesthesia was administered along the course of the saphenous vein using ultrasound guidance. Protective laser glasses were placed on the patient, and the laser was fired at 15 wtatts pulsed mode  For a total of 2717 joules.  A steri strip was applied to the puncture site.    ABD pads and thigh high compression stockings were applied.  Ace wrap bandages were applied over the phlebectomy sites and at the top of the saphenofemoral junction.  Blood loss was less than 15 cc.  The patient ambulated out of the operating room having tolerated the procedure well.

## 2012-12-23 NOTE — Progress Notes (Signed)
Subjective:     Patient ID: Jonathan Acevedo, male   DOB: 19-Jul-1928, 79 y.o.   MRN: [id number]  HPI this 79 year old male laser ablation left great saphenous vein performed under local tumescent anesthesia venous hypertension. Total 2700 J of energy was utilized. He tolerated the procedure well  Review of Systems     Objective:   Physical Exam BP 122/73  Pulse 77  Resp 16  Ht 5\' 11"  (1.803 m)  Wt 205 lb (92.987 kg)  BMI 28.60 kg/m2       Assessment:     Well-tolerated laser ablation left great saphenous vein performed under local tumescent anesthesia    Plan:     Return in one week for venous duplex exam to confirm closure left great saphenous vein

## 2012-12-24 ENCOUNTER — Telehealth: Payer: Self-pay | Admitting: *Deleted

## 2012-12-24 NOTE — Telephone Encounter (Signed)
Pt doing well. Not having much pain. Following all instructions. Reminded him of his fu appts next week.

## 2012-12-26 ENCOUNTER — Other Ambulatory Visit: Payer: Self-pay | Admitting: *Deleted

## 2012-12-26 MED ORDER — NITROGLYCERIN 0.4 MG SL SUBL: 0.4000 mg | SUBLINGUAL_TABLET | SUBLINGUAL | Status: DC | PRN

## 2012-12-27 ENCOUNTER — Encounter: Payer: Self-pay | Admitting: Vascular Surgery

## 2012-12-28 ENCOUNTER — Other Ambulatory Visit: Payer: Self-pay | Admitting: Cardiology

## 2012-12-30 ENCOUNTER — Ambulatory Visit (INDEPENDENT_AMBULATORY_CARE_PROVIDER_SITE_OTHER): Payer: Medicare Other | Admitting: Vascular Surgery

## 2012-12-30 ENCOUNTER — Other Ambulatory Visit: Payer: Self-pay | Admitting: Vascular Surgery

## 2012-12-30 ENCOUNTER — Ambulatory Visit (HOSPITAL_COMMUNITY)
Admission: RE | Admit: 2012-12-30 | Discharge: 2012-12-30 | Disposition: A | Discharge status: Home / Self Care | Payer: Medicare Other | Source: Ambulatory Visit | Attending: Vascular Surgery | Admitting: Vascular Surgery

## 2012-12-30 ENCOUNTER — Encounter: Payer: Self-pay | Admitting: Vascular Surgery

## 2012-12-30 VITALS — BP 126/64 | HR 60 | Resp 18 | Ht 71.0 in | Wt 210.0 lb

## 2012-12-30 DIAGNOSIS — I83893 Varicose veins of bilateral lower extremities with other complications: Secondary | ICD-10-CM | POA: Insufficient documentation

## 2012-12-30 NOTE — Progress Notes (Signed)
Subjective:     Patient ID: Jonathan Acevedo, male   DOB: 1928/03/21, 79 y.o.   MRN: [id number]  HPI this 79 year old male returns 1 week post laser ablation left great saphenous vein for venous hypertension with chronic edema and skin changes. He tolerated the procedure well. He has had mild to moderate discomfort along the left great saphenous vein in the thigh. He has worn his elastic compression stockings and taking ibuprofen as instructed.  Past Medical History  Diagnosis Date  . Myocardial infarction 1998  . Coronary artery disease     w remote anterior myocardial infarction  . Hypertension   . Renal insufficiency   . Aortic stenosis   . Rectal polyp   . Hypercholesteremia   . Kidney stones   . 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 Hypercalcemia 12/26/2011  . Afib     History  Substance Use Topics  . Smoking status: Former Smoker -- 1.00 packs/day for 45 years    Types: Cigarettes    Quit date: 07/19/1996  . Smokeless tobacco: Never Used  . Alcohol Use: No    No family history on file.  Allergies  Allergen Reactions  . Risedronate Sodium Other (See Comments)    Reaction=unknown    Current outpatient prescriptions:aspirin 325 MG EC tablet, Take 325 mg by mouth every morning. , Disp: , Rfl: ;  enalapril (VASOTEC) 20 MG tablet, Take 5 mg by mouth every morning. , Disp: , Rfl: ;  ezetimibe-simvastatin (VYTORIN) 10-20 MG per tablet, Take 1 tablet by mouth at bedtime., Disp: 90 tablet, Rfl: 3;  ferrous sulfate ([employer]S IRON) 325 (65 FE) MG tablet, Take 325 mg by mouth daily with breakfast., Disp: , Rfl:  hydrochlorothiazide (MICROZIDE) 12.5 MG capsule, Take 1 capsule (12.5 mg total) by mouth daily as needed. Patient states takes prn, Disp: 90 capsule, Rfl: 3;  HYDROcodone-acetaminophen (NORCO) 5-325 MG per tablet, Take 1-2 tablets by mouth every 6 (six) hours as needed for pain., Disp: 20 tablet, Rfl: 0 nitroGLYCERIN (NITROSTAT) 0.4 MG SL tablet, Place 1 tablet (0.4 mg total) under the  tongue every 5 (five) minutes as needed for chest pain., Disp: 25 tablet, Rfl: 3;  Oyster Shell (OYSTER CALCIUM) 500 MG TABS, Take 500 mg of elemental calcium by mouth 2 (two) times daily., Disp: , Rfl: ;  Tamsulosin HCl (FLOMAX) 0.4 MG CAPS, Take 0.4 mg by mouth at bedtime. , Disp: , Rfl:   BP 126/64  Pulse 60  Resp 18  Ht 5\' 11"  (1.803 m)  Wt 210 lb (95.255 kg)  BMI 29.30 kg/m2  Body mass index is 29.3 kg/(m^2).        Review of Systems denies chest pain, dyspnea on exertion, PND, orthopnea, hemoptysis.     Objective:   Physical Exam BP 126/64  Pulse 60  Resp 18  Ht 5\' 11"  (1.803 m)  Wt 210 lb (95.255 kg)  BMI 29.30 kg/m2  General well-developed well-nourished male in no apparent distress Lungs no rhonchi or wheezing Left leg with mild tenderness in proximal thigh down to the level of the great saphenous vein. No erythema noted. 1+ distal edema with less tenseness than prior to procedure. 3+ dorsalis pedis pulse palpable.  Today I ordered a venous duplex exam of the left leg which are reviewed and interpreted. Left great saphenous vein is totally closed from the calf to the saphenofemoral junction but no DVT      Assessment:     Successful laser ablation left great saphenous vein in  patient with chronic edema and skin changes and venous hypertension-well-tolerated    Plan:     Return in 2 weeks for identical procedure on the contralateral right leg

## 2012-12-31 ENCOUNTER — Encounter: Payer: Self-pay | Admitting: *Deleted

## 2013-01-06 ENCOUNTER — Encounter: Payer: Self-pay | Admitting: *Deleted

## 2013-01-10 ENCOUNTER — Encounter: Payer: Self-pay | Admitting: Vascular Surgery

## 2013-01-13 ENCOUNTER — Ambulatory Visit (INDEPENDENT_AMBULATORY_CARE_PROVIDER_SITE_OTHER): Payer: Medicare Other | Admitting: Vascular Surgery

## 2013-01-13 ENCOUNTER — Encounter: Payer: Self-pay | Admitting: Vascular Surgery

## 2013-01-13 DIAGNOSIS — I83893 Varicose veins of bilateral lower extremities with other complications: Secondary | ICD-10-CM

## 2013-01-13 NOTE — Progress Notes (Signed)
   Laser Ablation Procedure      Date: 01/13/2013    Jonathan Acevedo DOB:08-05-1928  Consent signed: Yes  Surgeon:J.D. Hart [place]  Procedure: Laser Ablation: right Greater Saphenous Vein  There were no vitals taken for this visit.  Start time: 11:10   End time: 11:50  Tumescent Anesthesia: 475 cc 0.9% NaCl with 50 cc Lidocaine HCL with 1% Epi and 15 cc 8.4% NaHCO3  Local Anesthesia: 6 cc Lidocaine HCL and NaHCO3 (ratio 2:1)  Pulsed mode: 15 watts, delay, 1.0 duration Total energy: 2295, total pulses: 154, total time: 2:33     Patient tolerated procedure well: Yes  Notes:   Description of Procedure:  After marking the course of the saphenous vein and the secondary varicosities in the standing position, the patient was placed on the operating table in the supine position, and the right leg was prepped and draped in sterile fashion. Local anesthetic was administered, and under ultrasound guidance the saphenous vein was accessed with a micro needle and guide wire; then the micro puncture sheath was placed. A guide wire was inserted to the saphenofemoral junction, followed by a 5 french sheath.  The position of the sheath and then the laser fiber below the junction was confirmed using the ultrasound and visualization of the aiming beam.  Tumescent anesthesia was administered along the course of the saphenous vein using ultrasound guidance. Protective laser glasses were placed on the patient, and the laser was fired at 15 watt pulsed mode advancing 1-2 mm per sec.  For a total of 2295 joules.  A steri strip was applied to the puncture site.    ABD pads and thigh high compression stockings were applied.  Ace wrap bandages were applied over the phlebectomy sites and at the top of the saphenofemoral junction.  Blood loss was less than 15 cc.  The patient ambulated out of the operating room having tolerated the procedure well.

## 2013-01-13 NOTE — Progress Notes (Signed)
Subjective:     Patient ID: Jonathan Acevedo, male   DOB: July 28, 1928, 79 y.o.   MRN: [id number]  HPI this 79 year old male had laser ablation right great saphenous vein performed under local tumescent anesthesia for venous hypertension with painful varicosities. A total of 2295 J of energy was utilized. He tolerated the procedure well.  Review of Systems     Objective:   Physical Exam There were no vitals taken for this visit.      Assessment:     Well-tolerated laser ablation right great saphenous vein performed under local tumescent anesthesia for venous hypertension and painful varicosities    Plan:     Return in one week for venous duplex exam right leg to confirm closure right great saphenous vein

## 2013-01-14 ENCOUNTER — Encounter: Payer: Self-pay | Admitting: *Deleted

## 2013-01-14 ENCOUNTER — Telehealth: Payer: Self-pay | Admitting: *Deleted

## 2013-01-14 ENCOUNTER — Encounter: Payer: Self-pay | Admitting: Vascular Surgery

## 2013-01-14 NOTE — Telephone Encounter (Signed)
Asked patient to call me if he is experiencing any problems or if he has any questions.

## 2013-01-20 ENCOUNTER — Encounter (HOSPITAL_COMMUNITY): Payer: Medicare Other

## 2013-01-20 ENCOUNTER — Encounter: Payer: Self-pay | Admitting: Vascular Surgery

## 2013-01-20 ENCOUNTER — Ambulatory Visit: Payer: Medicare Other | Admitting: Vascular Surgery

## 2013-01-21 ENCOUNTER — Ambulatory Visit (INDEPENDENT_AMBULATORY_CARE_PROVIDER_SITE_OTHER): Payer: Medicare Other | Admitting: Vascular Surgery

## 2013-01-21 ENCOUNTER — Encounter: Payer: Self-pay | Admitting: Vascular Surgery

## 2013-01-21 ENCOUNTER — Ambulatory Visit (HOSPITAL_COMMUNITY)
Admission: RE | Admit: 2013-01-21 | Discharge: 2013-01-21 | Disposition: A | Discharge status: Home / Self Care | Payer: Medicare Other | Source: Ambulatory Visit | Attending: Vascular Surgery | Admitting: Vascular Surgery

## 2013-01-21 ENCOUNTER — Other Ambulatory Visit: Payer: Self-pay | Admitting: Cardiology

## 2013-01-21 ENCOUNTER — Other Ambulatory Visit: Payer: Self-pay | Admitting: *Deleted

## 2013-01-21 VITALS — BP 134/88 | HR 64 | Resp 16 | Ht 71.0 in | Wt 208.0 lb

## 2013-01-21 DIAGNOSIS — I83893 Varicose veins of bilateral lower extremities with other complications: Secondary | ICD-10-CM

## 2013-01-21 MED ORDER — ENALAPRIL MALEATE 5 MG PO TABS: 5.0000 mg | ORAL_TABLET | Freq: Every day | ORAL | Status: DC

## 2013-01-21 NOTE — Progress Notes (Signed)
Subjective:     Patient ID: Jonathan Acevedo, male   DOB: 08/06/1928, 79 y.o.   MRN: [id number]  HPI this 79 year old male returns 1 week post laser ablation right great saphenous vein for venous hypertension with severe skin changes. He states that he has had minimal discomfort from the procedure and has noted decreased edema in the lower leg since the procedure. He is wearing his long light elastic compression stockings.   Past Medical History  Diagnosis Date  . Myocardial infarction 1998  . Coronary artery disease     w remote anterior myocardial infarction  . Hypertension   . Renal insufficiency   . Aortic stenosis   . Rectal polyp   . Hypercholesteremia   . Kidney stones   . 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 Hypercalcemia 12/26/2011  . Afib     History  Substance Use Topics  . Smoking status: Former Smoker -- 1.00 packs/day for 45 years    Types: Cigarettes    Quit date: 07/19/1996  . Smokeless tobacco: Never Used  . Alcohol Use: No    History reviewed. No pertinent family history.  Allergies  Allergen Reactions  . Risedronate Sodium Other (See Comments)    Reaction=unknown    Current outpatient prescriptions:aspirin 325 MG EC tablet, Take 325 mg by mouth every morning. , Disp: , Rfl: ;  enalapril (VASOTEC) 20 MG tablet, Take 5 mg by mouth every morning. , Disp: , Rfl: ;  ezetimibe-simvastatin (VYTORIN) 10-20 MG per tablet, Take 1 tablet by mouth at bedtime., Disp: 90 tablet, Rfl: 3;  ferrous sulfate ([employer] IRON) 325 (65 FE) MG tablet, Take 325 mg by mouth daily with breakfast., Disp: , Rfl:  hydrochlorothiazide (MICROZIDE) 12.5 MG capsule, Take 1 capsule (12.5 mg total) by mouth daily as needed. Patient states takes prn, Disp: 90 capsule, Rfl: 3;  HYDROcodone-acetaminophen (NORCO) 5-325 MG per tablet, Take 1-2 tablets by mouth every 6 (six) hours as needed for pain., Disp: 20 tablet, Rfl: 0 nitroGLYCERIN (NITROSTAT) 0.4 MG SL tablet, Place 1 tablet (0.4 mg total) under the  tongue every 5 (five) minutes as needed for chest pain., Disp: 25 tablet, Rfl: 3;  Oyster Shell (OYSTER CALCIUM) 500 MG TABS, Take 500 mg of elemental calcium by mouth 2 (two) times daily., Disp: , Rfl: ;  Tamsulosin HCl (FLOMAX) 0.4 MG CAPS, Take 0.4 mg by mouth at bedtime. , Disp: , Rfl:   BP 134/88  Pulse 64  Resp 16  Ht 5\' 11"  (1.803 m)  Wt 208 lb (94.348 kg)  BMI 29.02 kg/m2  Body mass index is 29.02 kg/(m^2).           Review of Systems denies chest pain, dyspnea on exertion, PND, orthopnea, hemoptysis    Objective:   Physical Exam BP 134/88  Pulse 64  Resp 16  Ht 5\' 11"  (1.803 m)  Wt 208 lb (94.348 kg)  BMI 29.02 kg/m2  General well-developed well-nourished male no apparent stress alert and oriented x3 Lungs no rhonchi or wheezing Right leg with minimal tenderness along course of great saphenous vein. 2+ dorsalis pedis pulse palpable. Hyperpigmentation lower third of leg no active ulceration and no obvious edema.  Today I ordered a venous duplex exam of the right leg which are viewed and interpreted. There is no DVT. There is total closure of the right great saphenous vein up to near the saphenofemoral junction with one incompetent perforator in the distal medial calf.      Assessment:  Successful laser ablation bilaterally great saphenous veins for chronic skin changes and edema-good result    Plan:     Return to see [place] on when necessary basis Will wear a short leg elastic compression stockings on chronic basis

## 2013-01-22 ENCOUNTER — Encounter: Payer: Self-pay | Admitting: Cardiology

## 2013-01-22 ENCOUNTER — Encounter: Payer: Self-pay | Admitting: *Deleted

## 2013-01-23 ENCOUNTER — Other Ambulatory Visit: Payer: Self-pay | Admitting: *Deleted

## 2013-02-24 ENCOUNTER — Telehealth: Payer: Self-pay | Admitting: Cardiology

## 2013-02-24 NOTE — Telephone Encounter (Signed)
New message   Dr. [place] advise patient to back off micozide & tamsulosin . One of these medication is effecting his blood pressure. Which causing him to be dizzy.

## 2013-02-25 NOTE — Telephone Encounter (Signed)
Returned call to patient 02/24/13 he stated he has been dizzy and could not remember which medication Dr.Jordan advised him to stop.Stated he was going to stop Microzide 02/25/13 to see if that is causing him to be dizzy.

## 2013-02-26 NOTE — Telephone Encounter (Signed)
Returned call to patient spoke to Jonathan Acevedo he advised ok to stop microzide.He advised to monitor B/P and call back if becomes elevated.Samples of Vytorin 10/20 mg left at 3rd floor front desk.

## 2013-03-13 ENCOUNTER — Ambulatory Visit: Payer: Medicare Other | Admitting: Cardiology

## 2013-03-17 ENCOUNTER — Encounter: Payer: Self-pay | Admitting: Cardiology

## 2013-03-17 ENCOUNTER — Other Ambulatory Visit: Payer: Self-pay

## 2013-03-17 ENCOUNTER — Ambulatory Visit (INDEPENDENT_AMBULATORY_CARE_PROVIDER_SITE_OTHER): Payer: Medicare Other | Admitting: Cardiology

## 2013-03-17 VITALS — BP 124/68 | HR 58 | Ht 71.0 in | Wt 213.0 lb

## 2013-03-17 DIAGNOSIS — I2510 Atherosclerotic heart disease of native coronary artery without angina pectoris: Secondary | ICD-10-CM

## 2013-03-17 DIAGNOSIS — I359 Nonrheumatic aortic valve disorder, unspecified: Secondary | ICD-10-CM

## 2013-03-17 DIAGNOSIS — I10 Essential (primary) hypertension: Secondary | ICD-10-CM

## 2013-03-17 DIAGNOSIS — I350 Nonrheumatic aortic (valve) stenosis: Secondary | ICD-10-CM

## 2013-03-17 DIAGNOSIS — I509 Heart failure, unspecified: Secondary | ICD-10-CM

## 2013-03-17 DIAGNOSIS — I5022 Chronic systolic (congestive) heart failure: Secondary | ICD-10-CM

## 2013-03-17 DIAGNOSIS — E7800 Pure hypercholesterolemia, unspecified: Secondary | ICD-10-CM

## 2013-03-17 MED ORDER — ATORVASTATIN CALCIUM 40 MG PO TABS: 40.0000 mg | ORAL_TABLET | Freq: Every day | ORAL | Status: DC

## 2013-03-17 MED ORDER — ENALAPRIL MALEATE 2.5 MG PO TABS: 2.5000 mg | ORAL_TABLET | Freq: Every day | ORAL | Status: DC

## 2013-03-17 NOTE — Progress Notes (Signed)
Jonathan Acevedo Date of Birth: 27-Jun-1928 Medical Record #[id number]  History of Present Illness: Jonathan Acevedo is seen for a follow up visit. He has a history of CAD with remote MI and prior stent to the LAD, HTN, CKD, AS and HLD. He was hospitalized briefly in early April of 2014 with chest pain. A subsequent stress Myoview study showed extensive scar of the anterior apex and inferior wall. Ejection fraction is 44%. This actually represented an improvement from echocardiogram in 2012 at which time his ejection fraction was 35-40%. In Ocober 2014 he presented with a prolonged episode of chest pain. Echo showed no change in his AS - medical management was continued. Cardiac enzymes were negative.  Since his last visit he did undergo laser surgery on his veins by Dr. Kellie Simmering. His swelling and discomfort is better. He does complain of lightheadedness when he is walking more than a certain pace. He continues to swim and walk at the gym. He brings records of his BP with occ. Low readings. Pulse is typically in the low 50s.    Current Outpatient Prescriptions  Medication Sig Dispense Refill  . aspirin 325 MG EC tablet Take 325 mg by mouth every morning.       . Cholecalciferol (VITAMIN D PO) Take by mouth.      Marland Kitchen HYDROcodone-acetaminophen (NORCO) 5-325 MG per tablet Take 1-2 tablets by mouth every 6 (six) hours as needed for pain.  20 tablet  0  . nitroGLYCERIN (NITROSTAT) 0.4 MG SL tablet Place 1 tablet (0.4 mg total) under the tongue every 5 (five) minutes as needed for chest pain.  25 tablet  3  . Oyster Shell (OYSTER CALCIUM) 500 MG TABS Take 500 mg of elemental calcium by mouth 2 (two) times daily.      . Tamsulosin HCl (FLOMAX) 0.4 MG CAPS Take 0.4 mg by mouth at bedtime.       Marland Kitchen atorvastatin (LIPITOR) 40 MG tablet Take 1 tablet (40 mg total) by mouth daily.  90 tablet  3  . enalapril (VASOTEC) 2.5 MG tablet Take 1 tablet (2.5 mg total) by mouth daily.  90 tablet  3   No current facility-administered  medications for this visit.    Allergies  Allergen Reactions  . Risedronate Sodium Other (See Comments)    Reaction=unknown    Past Medical History  Diagnosis Date  . Myocardial infarction 1998  . Coronary artery disease     w remote anterior myocardial infarction  . Hypertension   . Renal insufficiency   . Aortic stenosis   . Rectal polyp   . Hypercholesteremia   . Kidney stones   . 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 Hypercalcemia 12/26/2011  . Afib     Past Surgical History  Procedure Laterality Date  . Cardiac catheterization  08/18/2008    LMain 20, LAD stent 20 ISR, CFX 10, RCA 20, EF 45  . Umbilical hernia repair    . Inguinal hernia repair      left  . Orif femur fracture      right  . Cataract extraction    . Coronary stent placement  07/19/1996    Proximal LAD  . Parathyroidectomy      History  Smoking status  . Former Smoker -- 1.00 packs/day for 45 years  . Types: Cigarettes  . Quit date: 07/19/1996  Smokeless tobacco  . Never Used    History  Alcohol Use No    History reviewed. No pertinent family history.  Review of Systems: The review of systems is per the HPI.  All other systems were reviewed and are negative.  Physical Exam: BP 124/68  Pulse 58  Ht 5\' 11"  (1.803 m)  Wt 213 lb (96.616 kg)  BMI 29.72 kg/m2  SpO2 98% Patient is very pleasant and in no acute distress. Skin is warm and dry. Color is normal.  HEENT is unremarkable. Normocephalic/atraumatic. PERRL. Sclera are nonicteric. Neck is supple. No masses. No JVD. Lungs are clear. Cardiac exam shows a regular rate and rhythm. 2/6 harsh outflow murmur. Abdomen is soft. Extremities are full with brawny stasis and trace edema. Gait and ROM are intact. No gross neurologic deficits noted.  LABORATORY DATA:   Chemistry      Component Value Date/Time   NA 140 11/17/2012 0110   K 3.8 11/17/2012 0110   CL 105 11/17/2012 0110   CO2 25 11/17/2012 0110   BUN 30* 11/17/2012 0110    CREATININE 1.53* 11/17/2012 0110      Component Value Date/Time   CALCIUM 9.0 11/17/2012 0110   CALCIUM 10.5 08/17/2008 0926   ALKPHOS 61 11/17/2012 0110   AST 14 11/17/2012 0110   ALT 9 11/17/2012 0110   BILITOT 0.3 11/17/2012 0110      Lab Results  Component Value Date   WBC 7.2 05/03/2012   HGB 12.6* 11/16/2012   HCT 37.0* 11/16/2012   MCV 90.2 05/03/2012   PLT 183 05/03/2012   Lab Results  Component Value Date   CHOL 133 11/17/2012   HDL 54 11/17/2012   LDLCALC 58 11/17/2012   TRIG 105 11/17/2012   CHOLHDL 2.5 11/17/2012   Lab Results  Component Value Date   CKTOTAL 116 08/17/2008   CKMB 2.8 08/17/2008   TROPONINI <0.30 11/17/2012   Echo Study Conclusions October 2014  - Left ventricle: There is akinesis of the mid anteroseptal, distal septal and inferior wall and true apex. The cavity size was normal. There was mild concentric hypertrophy. Systolic function was mildly to moderately reduced. The estimated ejection fraction was in the range of 40% to 45%. Doppler parameters are consistent with abnormal left ventricular relaxation (grade 1 diastolic dysfunction). - Aortic valve: Cusp separation was reduced. Valve mobility was restricted. There was moderate stenosis. Mild regurgitation. Valve area: 1.3 cm^2 (0.7 cm2/m2). - Left atrium: The atrium was mildly dilated. - Right ventricle: The cavity size was mildly dilated. - Right atrium: The atrium was mildly dilated. - Atrial septum: A septal defect cannot be excluded.   Myoview Impression from April 2014  Exercise Capacity: Fair exercise capacity. Lexiscan was given due to inability to achieve target heart rate.  BP Response: Normal blood pressure response.  Clinical Symptoms: Back pain, dyspnea.  ECG Impression: No significant ST segment change suggestive of ischemia.  Comparison with Prior Nuclear Study: No images to compare  Overall Impression: Intermediate stress nuclear study. Fixed, large, severe mid to  apical anterior, apical inferior, mid to apical anteroseptal, and true apex perfusion defect. This suggests prior infarction with minimal peri-infarct ischemia.  LV Ejection Fraction: 44%. LV Wall Motion: Peri-apical akinesis.  Loralie Champagne  05/23/2012      Assessment / Plan: 1. CAD- s/p remote stenting of the LAD, negative Myoview from April of 2014 - I would favor continued medical management at this time.  2. Moderate AS with mild AR - recent echo - unchanged.   3. HTN - BP is low at times with associated lightheadedness. I recommend reducing enalapril to 2.5 mg  daily.  4. Venous varicosities - s/p laser surgery.  5. Hyperlipidemia- Vytorin is too expensive now. Will switch to atorvastatin 40 mg daily and check fasting lab in 6 months.

## 2013-03-17 NOTE — Patient Instructions (Signed)
Decrease enalapril to 2.5 mg daily  We will switch Vytorin to atorvastatin 40 mg daily.  Continue your other therapy  I will see you again in 6 months with fasting labs.

## 2013-04-02 ENCOUNTER — Other Ambulatory Visit: Payer: Self-pay

## 2013-04-02 ENCOUNTER — Telehealth: Payer: Self-pay

## 2013-04-02 MED ORDER — ATORVASTATIN CALCIUM 40 MG PO TABS: 40.0000 mg | ORAL_TABLET | Freq: Every day | ORAL | Status: DC

## 2013-04-02 NOTE — Telephone Encounter (Signed)
Patient called no answer.Left message on personal voice mail calling you back about your B/P.Spoke to [facility] CMA she stated you are having low B/P.Advised office is closed now and will be closed tomorrow due to inclement weather.Advised to call me back 04/07/13.

## 2013-04-10 ENCOUNTER — Telehealth: Payer: Self-pay

## 2013-04-10 NOTE — Telephone Encounter (Signed)
Spoke to patient 04/09/13 he stated his B/P has been low a couple of times.Stated for the past week B/P 108/62,125/64,117/67,107/61,105/57 pulse 60,59.Advised to continue same medications.Advised to monitor B/P and call back if needed,

## 2013-05-02 ENCOUNTER — Ambulatory Visit (INDEPENDENT_AMBULATORY_CARE_PROVIDER_SITE_OTHER): Payer: Medicare Other | Admitting: Internal Medicine

## 2013-05-02 ENCOUNTER — Ambulatory Visit: Payer: Self-pay

## 2013-05-02 VITALS — BP 112/60 | HR 61 | Temp 98.0°F | Resp 16 | Ht 70.0 in | Wt 212.0 lb

## 2013-05-02 DIAGNOSIS — R0609 Other forms of dyspnea: Secondary | ICD-10-CM

## 2013-05-02 DIAGNOSIS — J309 Allergic rhinitis, unspecified: Secondary | ICD-10-CM

## 2013-05-02 DIAGNOSIS — I739 Peripheral vascular disease, unspecified: Secondary | ICD-10-CM

## 2013-05-02 DIAGNOSIS — I350 Nonrheumatic aortic (valve) stenosis: Secondary | ICD-10-CM

## 2013-05-02 DIAGNOSIS — R42 Dizziness and giddiness: Secondary | ICD-10-CM

## 2013-05-02 DIAGNOSIS — R0989 Other specified symptoms and signs involving the circulatory and respiratory systems: Secondary | ICD-10-CM

## 2013-05-02 DIAGNOSIS — I359 Nonrheumatic aortic valve disorder, unspecified: Secondary | ICD-10-CM

## 2013-05-02 DIAGNOSIS — R609 Edema, unspecified: Secondary | ICD-10-CM

## 2013-05-02 MED ORDER — AMOXICILLIN 875 MG PO TABS: 875.0000 mg | ORAL_TABLET | Freq: Two times a day (BID) | ORAL | Status: DC

## 2013-05-02 NOTE — Progress Notes (Addendum)
Subjective:    Patient ID: Jonathan Acevedo, male    DOB: January 12, 1929, 80 y.o.   MRN: [id number]  HPI This chart was scribed for [facility], by Lovena Le Day, Scribe. This patient was seen in room 12 and the patient's care was started at 5:23 PM.  HPI Comments: Jonathan Acevedo is a 80 y.o. male who presents to the [facility] for dizziness and recent sinus congestion.   He reports feeling that BP medicines which he has been taking seem to make his BP low and that he often feels dizzy with exertion or walking up stairs. This seemed to get worse as alpha blocker was added for BPH. He states his BP decreases w/exertion and also feels SOB when the dizziness occurs. He denies any swelling in his lower legs. As he starts a vigorous walk he has to stop frequently due to feeling dizzy, this goes away quickly after stoppin. He also notes DOE at these times. Occas he has pain in oth legs when he walks, stopping with rest.  He states that his congestion seems to be mostly in his sinuses and upper airway. He reports gets more congested at night and wakes up feeling SOB at nighttime. He also reports a mild cough. He states pressure in his head when he bends over. No incr in perip edema.  See eval Dr Martinique for similar sxt 11/14 with no change in cardiac echo. Treatment of varicosities did not seem to make a difference.  Patient Active Problem List   Diagnosis Date Noted  . Varicose veins of lower extremities with other complications 33/29/5188  . Unstable angina 11/16/2012  . Chronic venous insufficiency 07/29/2012  . Precordial pain - MI ruled out, outpatient stress test planned 05/04/2012  . 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 Hypercalcemia 12/26/2011  . Chronic systolic CHF (congestive heart failure) 07/03/2011  . Primary hyperparathyroidism 07/03/2011  . Ischemic cardiomyopathy 11/29/2010  . Myocardial infarction   . Coronary artery disease   . Hypertension   . Renal  insufficiency   . Aortic stenosis   . Rectal polyp   . Hypercholesteremia    Current outpatient prescriptions:aspirin 325 MG EC tablet, Take 325 mg by mouth every morning. , atorvastatin (LIPITOR) 40 MG tablet, Take 1 tablet (40 mg total) by mouth daily  Cholecalciferol (VITAMIN D PO), Take by mouth., Disp: , Rfl: ;   enalapril (VASOTEC) 2.5 MG tablet, Take 1 tablet (2.5 mg total) by mouth daily., Disp: 90 tablet, Rfl: 3 HYDROcodone-acetaminophen (NORCO) 5-325 MG per tablet, Take 1-2 tablets by mouth every 6 (six) hours as needed for pain nitroGLYCERIN (NITROSTAT) 0.4 MG SL tablet, Place 1 tablet (0.4 mg total) under the tongue every 5 (five) minutes as needed for chest pain., Disp: 25 tablet, Rfl: 3;   Oyster Shell (OYSTER CALCIUM) 500 MG TABS, Take 500 mg of elemental calcium by mouth 2 (two) times daily., Disp: , Rfl:  amoxicillin (AMOXIL) 875 MG tablet, Take 1 tablet (875 mg total) by mouth 2 (two) times daily., Disp: 20 tablet, Rfl: 0 tamulosin .4mg  qd  Allergies  Allergen Reactions  . Risedronate Sodium Other (See Comments)    Reaction=unknown      Review of Systems  Constitutional: Negative for fever, chills, appetite change, fatigue and unexpected weight change.  HENT: Positive for congestion, postnasal drip, rhinorrhea, sinus pressure and voice change. Negative for sneezing and trouble swallowing.   Eyes: Negative for redness and visual disturbance.  Respiratory: Positive for cough and  shortness of breath. Negative for wheezing.   Cardiovascular: Negative for chest pain, palpitations and leg swelling.  Gastrointestinal: Negative for abdominal pain.  Musculoskeletal: Negative for back pain.  Neurological: Positive for dizziness. Negative for weakness and headaches.  Psychiatric/Behavioral: Negative for sleep disturbance.       Objective:   Physical Exam  Nursing note and vitals reviewed. Constitutional: He is oriented to person, place, and time. He appears well-developed  and well-nourished. No distress.  HENT:  Head: Normocephalic and atraumatic.  Right Ear: External ear normal.  Left Ear: External ear normal.  Mouth/Throat: Oropharynx is clear and moist.  Congested nares/boggy turbs/tender max to perc  Eyes: Conjunctivae are normal. Right eye exhibits no discharge. Left eye exhibits no discharge.  Neck: Normal range of motion. No thyromegaly present.  Cardiovascular: Normal rate and intact distal pulses.   Murmur heard. 2/6 SEM LSB and base w/ rad to carotids  bradycardia  Pulmonary/Chest: Effort normal. No respiratory distress. He has no wheezes.  Abdominal:  No femoral bruits  Musculoskeletal: Normal range of motion.  Trace edema bilaterally with hyperpigmentation of  lower extremities  Lymphadenopathy:    He has no cervical adenopathy.  Neurological: He is alert and oriented to person, place, and time. No cranial nerve deficit.  Skin: Skin is warm and dry.  Psychiatric: He has a normal mood and affect. His behavior is normal. Thought content normal.    Triage Vitals: BP 112/60  Pulse 61  Temp(Src) 98 F (36.7 C) (Oral)  Resp 16  Ht 5\' 10"  (1.778 m)  Wt 212 lb (96.163 kg)  BMI 30.42 kg/m2  SpO2 95%  EKG= change in from the past and no evidence of complete heart block   [facility] reading (PRIMARY) by  Dr. Willodean Leven=No change from last cxr--R hilar fullness   Assessment & Plan:  DIAGNOSTIC STUDIES: Oxygen Saturation is 95% on room air, adequate by my interpretation.    COORDINATION OF CARE: At 520 PM Discussed treatment plan with patient. Patient agrees.  45 minute OV  I personally performed the services described in this documentation, which was scribed in my presence. The recorded information has been reviewed and is accurate. I have completed the patient encounter in its entirety as documented by the scribe, with editing by me where necessary. Natalyah Cummiskey P. Laney Pastor, M.D.  1) Dizziness ? Secondary to hypotension with exertion  2) DOE  (dyspnea on exertion)--deconditioning should not be a factor as he has been relatively active   3)AR (allergic rhinitis)--nocturnal breathing problems//with recent change to purulent discharge or will treat empirically with amoxicillin  4) ? Claudication  5) Edema -stable  6) Aortic stenosis -unchanged by recent echo  Consideration should be given to alpha blockade causing this problem and so Flomax will be discontinued/home bp monitered and exec tol followed  Meds ordered this encounter  Medications  . amoxicillin (AMOXIL) 875 MG tablet    Sig: Take 1 tablet (875 mg total) by mouth 2 (two) times daily.    Dispense:  20 tablet    Refill:  0

## 2013-05-03 ENCOUNTER — Encounter: Payer: Self-pay | Admitting: Internal Medicine

## 2013-05-05 ENCOUNTER — Telehealth: Payer: Self-pay | Admitting: Cardiology

## 2013-05-05 NOTE — Telephone Encounter (Signed)
Returned call to patient no answer.LMTC. 

## 2013-05-05 NOTE — Telephone Encounter (Signed)
New Message  Pt requests a call back to discuss BP and EKG results from Friday the 48 th at Dr. Laney Pastor. Pt states that the test came back negative. Pt requests a call back to discuss results, if received and medication adjustments.

## 2013-05-06 NOTE — Telephone Encounter (Signed)
Returned call to patient no answer.LMTC. 

## 2013-05-06 NOTE — Telephone Encounter (Signed)
Received call from patient he stated he saw Dr.Doolittle last Friday 05/02/13.Stated Dr.Doolittle decreased vasotec to 2.5 mg daily due to low B/P.Stated when he got home he noticed he was already taking vasotec 2.5 mg daily.Stated he decreased vasotec to 1.25 mg daily.Stated Dr.Doolittle also stopped tamosulin.B/P ranging 91/57,123/73,125/50,123/73.Stated he also has been having a problem at night feels like he is gasping for breath.Stated he read side effects of atorvastatin and one of the side effects is a respiratory problem.Stated he did not take atorvastatin last night and rested well no breathing problem.Stated he use to take vytorin 10/20 mg with no problems and would like to restart.Stated today he feels good B/P 130/76 pulse 52.Will check with Dr.Jordan and call back this afternoon.

## 2013-05-07 NOTE — Telephone Encounter (Signed)
His current medication is not the cause of his dyspnea. He has been on Vasotec for years. With chills wonder if he may have an infection. Check temp and let us know if symptoms persist.  [name] MD, [facility]

## 2013-05-07 NOTE — Telephone Encounter (Signed)
Received call from patient he stated he did take vytorin or atorvastatin or vasotec last night.Stated he woke up around 4:00 am today  B/P 144/86.Stated he took vasotec 2.5 mg and 15 min later he had labored breathing.Stated he looked on internet and vasotec can cause a problem with breathing too.Stated he has been taking vasotec for years with no problem.Stated he went back to sleep, had chills put extra blanket on bed and went back to sleep.Stated he feels ok now B/P 126/64 pulse 63.Stated he has noticed labored breathing at night since last Friday 05/02/13.Stated cxr and ekg at Dr.Doolittles 05/02/13 normal.Dr.Dooliitle found B/P to be low.Patient wants Dr.Jordan's advice on what medication to take.

## 2013-05-07 NOTE — Telephone Encounter (Signed)
Returned call to patient Dr.Jordan advised current medications not the cause of your sob.Advised needs to check temp.Patient checked temp 98 degrees.Stated Dr.Doolittle did prescribe Amoxicillin 875 mg twice a day for 10 days.Advised to continue Vasotec 2.5 mg daily and patient will take Vytorin 10/20 mg daily since he has a 4 month supply.Advised to call back if symptoms persist.

## 2013-05-07 NOTE — Addendum Note (Signed)
Addended by: Golden Hurter D on: 05/07/2013 04:04 PM   Modules accepted: Orders, Medications

## 2013-05-08 ENCOUNTER — Telehealth: Payer: Self-pay | Admitting: Cardiology

## 2013-05-08 NOTE — Telephone Encounter (Signed)
New message ° ° ° ° °Returned Cheryl's call °

## 2013-05-08 NOTE — Telephone Encounter (Signed)
Returned call to patient no answer.LMTC. 

## 2013-05-12 NOTE — Telephone Encounter (Signed)
Patient called no answer.LMTC. 

## 2013-05-13 ENCOUNTER — Ambulatory Visit (INDEPENDENT_AMBULATORY_CARE_PROVIDER_SITE_OTHER): Payer: Medicare Other | Admitting: Internal Medicine

## 2013-05-13 ENCOUNTER — Ambulatory Visit: Payer: Medicare Other

## 2013-05-13 VITALS — BP 116/72 | HR 58 | Temp 97.8°F | Resp 16 | Ht 70.0 in | Wt 210.0 lb

## 2013-05-13 DIAGNOSIS — S82839A Other fracture of upper and lower end of unspecified fibula, initial encounter for closed fracture: Secondary | ICD-10-CM

## 2013-05-13 DIAGNOSIS — S8990XA Unspecified injury of unspecified lower leg, initial encounter: Secondary | ICD-10-CM

## 2013-05-13 DIAGNOSIS — S99921A Unspecified injury of right foot, initial encounter: Secondary | ICD-10-CM

## 2013-05-13 DIAGNOSIS — S82899A Other fracture of unspecified lower leg, initial encounter for closed fracture: Secondary | ICD-10-CM

## 2013-05-13 DIAGNOSIS — S99911A Unspecified injury of right ankle, initial encounter: Secondary | ICD-10-CM

## 2013-05-13 DIAGNOSIS — S99919A Unspecified injury of unspecified ankle, initial encounter: Secondary | ICD-10-CM

## 2013-05-13 DIAGNOSIS — S99929A Unspecified injury of unspecified foot, initial encounter: Secondary | ICD-10-CM

## 2013-05-13 MED ORDER — HYDROCODONE-ACETAMINOPHEN 5-325 MG PO TABS: 1.0000 | ORAL_TABLET | Freq: Four times a day (QID) | ORAL | Status: DC | PRN

## 2013-05-13 NOTE — Telephone Encounter (Signed)
Follow up     Talk to a nurse regarding his blood pressure.

## 2013-05-13 NOTE — Progress Notes (Signed)
Subjective:     Patient ID: Jonathan Acevedo, male   DOB: 1928-03-31, 80 y.o.   MRN: [id number]  HPI 80 YO caucasian male presents with a syncopal episode this morning while walking down his driveway to get the paper and right high ankle and medial foot pain. He was able to ambulate and conduct his ADL's after the episode but continued to have pain with ambulation and decided to come in. He denies ever actually having a full syncopal episode before but states he frequently gets dizzy. He has been having dizzy spells since last April upon starting tamsulosin for BPH, had recently been stopped by his physician with improvement in dizziness, but decided to take his tamsulosin last evening due to increased urinary retention. He states ambulation makes his ankle and foot pain worse and it is relieved with rest.  He denied any confusion after waking up, denies hitting his head, and states he does not know how long he was unconscious.   Review of Systems Gen: denies fever, night sweat, weight loss Neuro: admits to intermittent dizziness, syncope, denies visual change, floaters Cardio: denies chest pain, palpitations, abnormal heart beat Pulm: denies shortness of breath, accessory muscle use in breathing, or rapid breathing GI: denies abdominal pain, N/V/D Musculo: admits to right ankle and foot pain, swelling and ecchymosis       Objective:   Physical Exam   Gen: alert and oriented x 3 in no acute distress Neuro: CN II-XII grossly intact, good peripheral sensation, 3/5 strength testing in right lower extremity, 4/5 strength in left lower extremity Cardio: 2/6 systolic murmur at all listening posts, no rub, or gallop//reg rhythm GI: soft nontender, no organomegaly  Musculo: + squeeze test of left high ankle syndesmosis, pain to palpation of medial right foot, decreased dorsiflexion, plantarflexion, eversion, and inversion of right ankle,  - talar tilt, - anterior drawer of the right ankle, intact  sensation and vibration Psych: appropriate mood and affect      3 VIEW X-ray  Of foot and ankle interpretation by Dr. Laney Pastor spiral fracture of the right distal fibula with no angulation or displacement and intact mortice of the ankle, with small of avulsion of both malleoli likely old Assessment:    1) right ankle fracture 2) syncope likely 2/2 medication interaction 3) HTN 4) systolic CHF 5) aortic stenosis 6) ischemic cardiomyopathy 7) hx of MI 8) hx of unstable anfgina 9) hx of varicose veins s/p cauterization      Plan:    1) splinting of right ankle with non weightbearing for 3-5 days  2) referral to orthopedics (Dr. Rushie Nyhan preferred) 3) reiterate d/c of tamsulosin which is likely causing his syncope    I have completed the patient encounter in its entirety as documented by [facility], with editing by me where necessary. Darris Staiger P. Laney Pastor, M.D.  With splint for nonwt bearing, he is unable to manage crutches without marked DOE from CHF--is unsteady after this--so changed to Cam walker with "walker" for safety

## 2013-05-13 NOTE — Telephone Encounter (Signed)
Returned call to patient he stated he decided to take flomax last night because he was having urinary problems.Stated he walked to end of driveway this morning to pick up newspaper felt dizzy,light headed.Stated when he got to his garage he passed out.Stated he woke up and could not move his rt leg.He went to [facility] saw Dr.Doolittle, he fractured rt tibia.Stated he will be seeing Dr.Geoffrey this Friday 05/16/13.Stated Dr.Doolittle advised him to throw away flomax.Stated he only takes vasotec 2.5 mg daily for B/P.Stated B/P today has been 107/61,115/33,99/65,101/62 pulse 64 beats/min.Patient wanted to know if he needs to decrease vasotec.Message sent to [place] for advice.

## 2013-05-14 ENCOUNTER — Telehealth: Payer: Self-pay

## 2013-05-14 NOTE — Telephone Encounter (Signed)
Returned call to patient Dr.Jordan advised to decrease vasotec to 1.25 mg daily.Advised to continue to monitor B/P and call back if needed.

## 2013-05-14 NOTE — Telephone Encounter (Signed)
Reduce Vasotec to 1.25 mg daily  [name] MD, [facility]

## 2013-05-14 NOTE — Addendum Note (Signed)
Addended by: Golden Hurter D on: 05/14/2013 02:31 PM   Modules accepted: Orders, Medications

## 2013-05-14 NOTE — Telephone Encounter (Signed)
Patient would like to discuss his OV from yesterday. He has a lot of concerns and feels that his condition may be more serious than what was explained to him. States that he received a call about a referral appointment and they mentioned that he may have a broken leg.  Also needs a copy of his x-rays to take to his orthopaedist appointment on Friday morning.   [phone]

## 2013-05-14 NOTE — Telephone Encounter (Signed)
Left message on machine to call back. Gave message to xray to make a copy of xray.

## 2013-05-15 ENCOUNTER — Ambulatory Visit (INDEPENDENT_AMBULATORY_CARE_PROVIDER_SITE_OTHER): Payer: Medicare Other | Admitting: Emergency Medicine

## 2013-05-15 VITALS — BP 118/70 | HR 65 | Temp 97.9°F | Resp 18

## 2013-05-15 DIAGNOSIS — R0602 Shortness of breath: Secondary | ICD-10-CM

## 2013-05-15 DIAGNOSIS — I4949 Other premature depolarization: Secondary | ICD-10-CM

## 2013-05-15 DIAGNOSIS — I493 Ventricular premature depolarization: Secondary | ICD-10-CM

## 2013-05-15 LAB — POCT CBC
GRANULOCYTE PERCENT: 66.1 % (ref 37–80)
HEMATOCRIT: 35.4 % — AB (ref 43.5–53.7)
Hemoglobin: 11 g/dL — AB (ref 14.1–18.1)
Lymph, poc: 1.7 (ref 0.6–3.4)
MCH, POC: 29.6 pg (ref 27–31.2)
MCHC: 31.1 g/dL — AB (ref 31.8–35.4)
MCV: 95.5 fL (ref 80–97)
MID (CBC): 0.7 (ref 0–0.9)
MPV: 10.6 fL (ref 0–99.8)
POC Granulocyte: 4.6 (ref 2–6.9)
POC LYMPH %: 24.6 % (ref 10–50)
POC MID %: 9.3 %M (ref 0–12)
Platelet Count, POC: 134 10*3/uL — AB (ref 142–424)
RBC: 3.71 M/uL — AB (ref 4.69–6.13)
RDW, POC: 15 %
WBC: 7 10*3/uL (ref 4.6–10.2)

## 2013-05-15 NOTE — Telephone Encounter (Signed)
lmom to cb. 

## 2013-05-15 NOTE — Progress Notes (Addendum)
[facility] [address] [place] [address] [phone]  Date:  05/15/2013   Name:  Jonathan Acevedo   DOB:  26-Jun-1928   MRN:  [id number]  PCP:  Florina Ou, MD    Chief Complaint: Shortness of Breath and Ear Fullness   History of Present Illness:  Jonathan Acevedo is a 80 y.o. very pleasant male patient who presents with the following:  Has breathlessness when lays down at night.  Experienced another episode of shortness of breath in the waiting room here.  Active otherwise and has no DOE.  "runs" and swims with no difficulty.  No chest pain, wheezing or cough. No coryza.  No history of COPD.  Stopped smoking 15 years ago.  No fever or chills.  No nausea or vomiting.  No nocturia.  Has lower leg edema.  History of CHF.  Denies other complaint or health concern today.   Patient Active Problem List   Diagnosis Date Noted  . Varicose veins of lower extremities with other complications 53/61/4431  . Unstable angina 11/16/2012  . Chronic venous insufficiency 07/29/2012  . Precordial pain - MI ruled out, outpatient stress test planned 05/04/2012  . 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 Hypercalcemia 12/26/2011  . Chronic systolic CHF (congestive heart failure) 07/03/2011  . Primary hyperparathyroidism 07/03/2011  . Ischemic cardiomyopathy 11/29/2010  . Myocardial infarction   . Coronary artery disease   . Hypertension   . Renal insufficiency   . Aortic stenosis   . Rectal polyp   . Hypercholesteremia     Past Medical History  Diagnosis Date  . Myocardial infarction 1998  . Coronary artery disease     w remote anterior myocardial infarction  . Hypertension   . Renal insufficiency   . Aortic stenosis   . Rectal polyp   . Hypercholesteremia   . Kidney stones   . 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 Hypercalcemia 12/26/2011  . Afib     Past Surgical History  Procedure Laterality Date  . Cardiac catheterization  08/18/2008    LMain 20, LAD stent 20 ISR,  CFX 10, RCA 20, EF 45  . Umbilical hernia repair    . Inguinal hernia repair      left  . Orif femur fracture      right  . Cataract extraction    . Coronary stent placement  07/19/1996    Proximal LAD  . Parathyroidectomy      History  Substance Use Topics  . Smoking status: Former Smoker -- 1.00 packs/day for 45 years    Types: Cigarettes    Quit date: 07/19/1996  . Smokeless tobacco: Never Used  . Alcohol Use: No    No family history on file.  Allergies  Allergen Reactions  . Risedronate Sodium Other (See Comments)    Reaction=unknown    Medication list has been reviewed and updated.  Current Outpatient Prescriptions on File Prior to Visit  Medication Sig Dispense Refill  . aspirin 325 MG EC tablet Take 325 mg by mouth every morning.       . Cholecalciferol (VITAMIN D PO) Take by mouth.      . enalapril (VASOTEC) 2.5 MG tablet Take 1/2 tablet daily  30 tablet  6  . ezetimibe-simvastatin (VYTORIN) 10-20 MG per tablet Take 1 tablet by mouth daily.  30 tablet  6  . HYDROcodone-acetaminophen (NORCO/VICODIN) 5-325 MG per tablet Take 1 tablet by mouth every 6 (six) hours as needed for moderate pain.  20 tablet  0  . nitroGLYCERIN (NITROSTAT) 0.4 MG SL tablet Place 1 tablet (0.4 mg total) under the tongue every 5 (five) minutes as needed for chest pain.  25 tablet  3  . Oyster Shell (OYSTER CALCIUM) 500 MG TABS Take 500 mg of elemental calcium by mouth 2 (two) times daily.      Marland Kitchen amoxicillin (AMOXIL) 875 MG tablet Take 1 tablet (875 mg total) by mouth 2 (two) times daily.  20 tablet  0  . HYDROcodone-acetaminophen (NORCO) 5-325 MG per tablet Take 1-2 tablets by mouth every 6 (six) hours as needed for pain.  20 tablet  0   No current facility-administered medications on file prior to visit.    Review of Systems:  As per HPI, otherwise negative.    Physical Examination: Filed Vitals:   05/15/13 1123  BP: 118/70  Pulse: 65  Temp: 97.9 F (36.6 C)  Resp: 18   There  were no vitals filed for this visit. There is no weight on file to calculate BMI. Ideal Body Weight:    GEN: WDWN, NAD, Non-toxic, A & O x 3 HEENT: Atraumatic, Normocephalic. Neck supple. No masses, No LAD. Ears and Nose: No external deformity. CV: irregular rhythm, No G/R. Has 2/6 harsh SEM No JVD. No thrill. No extra heart sounds. PULM: CTA B, no wheezes, crackles, rhonchi. No retractions. No resp. distress. No accessory muscle use. ABD: S, NT, ND, +BS. No rebound. No HSM. EXTR: No c/c. +2 pitting left and orthopedic boot on right  NEURO Normal gait.  PSYCH: Normally interactive. Conversant. Not depressed or anxious appearing.  Calm demeanor.    Assessment and Plan: Three week history of shortness of breath. New onset (last month) of PVC's Discussed with Dr Martinique who suggested checking labs but did NOT recommend any change in medications. Follow up with Dr Martinique in office.  Signed,  Ellison Carwin, MD

## 2013-05-15 NOTE — Patient Instructions (Signed)
Premature Ventricular Contraction Premature ventricular contraction (PVC) is an irregularity of the heart rhythm involving extra or skipped heartbeats. In some cases, they may occur without obvious cause or heart disease. Other times, they can be caused by an electrolyte change in the blood. These need to be corrected. They can also be seen when there is not enough oxygen going to the heart. A common cause of this is plaque or cholesterol buildup. This buildup decreases the blood supply to the heart. In addition, extra beats may be caused or aggravated by:  Excessive smoking.  Alcohol consumption.  Caffeine.  Certain medications  Some street drugs. SYMPTOMS   The sensation of feeling your heart skipping a beat (palpitations).  In many cases, the person may have no symptoms. SIGNS AND TESTS   A physical examination may show an occasional irregularity, but if the PVC beats do not happen often, they may not be found on physical exam.  Blood pressure is usually normal.  Other tests that may find extra beats of the heart are:  An EKG (electrocardiogram)  A Holter monitor which can monitor your heart over longer periods of time  An Angiogram (study of the heart arteries). TREATMENT  Usually extra heartbeats do not need treatment. The condition is treated only if symptoms are severe or if extra beats are very frequent or are causing problems. An underlying cause, if discovered, may also require treatment.  Treatment may also be needed if there may be a risk for other more serious cardiac arrhythmias.  PREVENTION   Moderation in caffeine, alcohol, and tobacco use may reduce the risk of ectopic heartbeats in some people.  Exercise often helps people who lead a sedentary (inactive) lifestyle. PROGNOSIS  PVC heartbeats are generally harmless and do not need treatment.  RISKS AND COMPLICATIONS   Ventricular tachycardia (occasionally).  There usually are no complications.  Other  arrhythmias (occasionally). SEEK IMMEDIATE MEDICAL CARE IF:   You feel palpitations that are frequent or continual.  You develop chest pain or other problems such as shortness of breath, sweating, or nausea and vomiting.  You become light-headed or faint (pass out).  You get worse or do not improve with treatment. Document Released: 09/10/2003 Document Revised: 04/17/2011 Document Reviewed: 03/22/2007 ExitCare Patient Information 2014 [employer].  

## 2013-05-16 ENCOUNTER — Encounter: Payer: Self-pay | Admitting: *Deleted

## 2013-05-16 ENCOUNTER — Telehealth: Payer: Self-pay | Admitting: Cardiology

## 2013-05-16 LAB — COMPREHENSIVE METABOLIC PANEL
ALBUMIN: 3.8 g/dL (ref 3.5–5.2)
ALK PHOS: 70 U/L (ref 39–117)
ALT: 13 U/L (ref 0–53)
AST: 16 U/L (ref 0–37)
BUN: 27 mg/dL — ABNORMAL HIGH (ref 6–23)
CALCIUM: 9.1 mg/dL (ref 8.4–10.5)
CO2: 26 mEq/L (ref 19–32)
CREATININE: 1.38 mg/dL — AB (ref 0.50–1.35)
Chloride: 106 mEq/L (ref 96–112)
GLUCOSE: 97 mg/dL (ref 70–99)
POTASSIUM: 4.3 meq/L (ref 3.5–5.3)
Sodium: 140 mEq/L (ref 135–145)
Total Bilirubin: 0.7 mg/dL (ref 0.2–1.2)
Total Protein: 6.3 g/dL (ref 6.0–8.3)

## 2013-05-16 LAB — MAGNESIUM: Magnesium: 1.9 mg/dL (ref 1.5–2.5)

## 2013-05-16 NOTE — Telephone Encounter (Signed)
New Message  Pt called states that he had urgent care fax over lab results// requests a call back to confirm that our office received them

## 2013-05-16 NOTE — Telephone Encounter (Signed)
Labs reviewed. Mild anemia. Magnesium level is OK. Potassium is ok. Renal insufficiency is stable.   [name] MD, [facility]

## 2013-05-16 NOTE — Telephone Encounter (Signed)
Spoke with patient--pt had CMP/CBCd/Magnesium level at Urgent Care 05/15/13, results in [place]. Pt would like for Dr Martinique to review these results.

## 2013-05-16 NOTE — Telephone Encounter (Signed)
Pt was seen in our clinic yesterday

## 2013-05-17 ENCOUNTER — Inpatient Hospital Stay (HOSPITAL_COMMUNITY)
Admission: EM | Admit: 2013-05-17 | Discharge: 2013-05-20 | DRG: 251 | Disposition: A | Discharge status: Home / Self Care | Payer: Medicare Other | Attending: Internal Medicine | Admitting: Internal Medicine

## 2013-05-17 ENCOUNTER — Ambulatory Visit: Payer: Medicare Other

## 2013-05-17 ENCOUNTER — Ambulatory Visit (INDEPENDENT_AMBULATORY_CARE_PROVIDER_SITE_OTHER): Payer: Medicare Other | Admitting: Emergency Medicine

## 2013-05-17 ENCOUNTER — Encounter (HOSPITAL_COMMUNITY): Payer: Self-pay | Admitting: Emergency Medicine

## 2013-05-17 ENCOUNTER — Emergency Department (HOSPITAL_COMMUNITY): Payer: Medicare Other

## 2013-05-17 ENCOUNTER — Telehealth: Payer: Self-pay | Admitting: Physician Assistant

## 2013-05-17 VITALS — BP 120/62 | HR 48 | Temp 97.6°F | Resp 16

## 2013-05-17 DIAGNOSIS — I2510 Atherosclerotic heart disease of native coronary artery without angina pectoris: Secondary | ICD-10-CM

## 2013-05-17 DIAGNOSIS — R609 Edema, unspecified: Secondary | ICD-10-CM

## 2013-05-17 DIAGNOSIS — Z7982 Long term (current) use of aspirin: Secondary | ICD-10-CM

## 2013-05-17 DIAGNOSIS — E7800 Pure hypercholesterolemia, unspecified: Secondary | ICD-10-CM | POA: Diagnosis present

## 2013-05-17 DIAGNOSIS — I5043 Acute on chronic combined systolic (congestive) and diastolic (congestive) heart failure: Secondary | ICD-10-CM

## 2013-05-17 DIAGNOSIS — I493 Ventricular premature depolarization: Secondary | ICD-10-CM

## 2013-05-17 DIAGNOSIS — E785 Hyperlipidemia, unspecified: Secondary | ICD-10-CM | POA: Diagnosis present

## 2013-05-17 DIAGNOSIS — R0602 Shortness of breath: Secondary | ICD-10-CM

## 2013-05-17 DIAGNOSIS — S8290XD Unspecified fracture of unspecified lower leg, subsequent encounter for closed fracture with routine healing: Secondary | ICD-10-CM

## 2013-05-17 DIAGNOSIS — I359 Nonrheumatic aortic valve disorder, unspecified: Secondary | ICD-10-CM | POA: Diagnosis present

## 2013-05-17 DIAGNOSIS — E210 Primary hyperparathyroidism: Secondary | ICD-10-CM | POA: Diagnosis present

## 2013-05-17 DIAGNOSIS — N289 Disorder of kidney and ureter, unspecified: Secondary | ICD-10-CM

## 2013-05-17 DIAGNOSIS — N183 Chronic kidney disease, stage 3 unspecified: Secondary | ICD-10-CM | POA: Diagnosis present

## 2013-05-17 DIAGNOSIS — I252 Old myocardial infarction: Secondary | ICD-10-CM

## 2013-05-17 DIAGNOSIS — Z9849 Cataract extraction status, unspecified eye: Secondary | ICD-10-CM

## 2013-05-17 DIAGNOSIS — Z79899 Other long term (current) drug therapy: Secondary | ICD-10-CM

## 2013-05-17 DIAGNOSIS — I959 Hypotension, unspecified: Secondary | ICD-10-CM | POA: Diagnosis present

## 2013-05-17 DIAGNOSIS — I129 Hypertensive chronic kidney disease with stage 1 through stage 4 chronic kidney disease, or unspecified chronic kidney disease: Secondary | ICD-10-CM | POA: Diagnosis present

## 2013-05-17 DIAGNOSIS — I4949 Other premature depolarization: Secondary | ICD-10-CM

## 2013-05-17 DIAGNOSIS — I509 Heart failure, unspecified: Secondary | ICD-10-CM

## 2013-05-17 DIAGNOSIS — I4891 Unspecified atrial fibrillation: Secondary | ICD-10-CM | POA: Diagnosis present

## 2013-05-17 DIAGNOSIS — N189 Chronic kidney disease, unspecified: Secondary | ICD-10-CM | POA: Diagnosis present

## 2013-05-17 DIAGNOSIS — I350 Nonrheumatic aortic (valve) stenosis: Secondary | ICD-10-CM

## 2013-05-17 DIAGNOSIS — Z87442 Personal history of urinary calculi: Secondary | ICD-10-CM

## 2013-05-17 DIAGNOSIS — R55 Syncope and collapse: Secondary | ICD-10-CM | POA: Diagnosis present

## 2013-05-17 DIAGNOSIS — I2589 Other forms of chronic ischemic heart disease: Secondary | ICD-10-CM | POA: Diagnosis present

## 2013-05-17 DIAGNOSIS — I10 Essential (primary) hypertension: Secondary | ICD-10-CM

## 2013-05-17 DIAGNOSIS — I5023 Acute on chronic systolic (congestive) heart failure: Secondary | ICD-10-CM

## 2013-05-17 DIAGNOSIS — I255 Ischemic cardiomyopathy: Secondary | ICD-10-CM

## 2013-05-17 DIAGNOSIS — Z87891 Personal history of nicotine dependence: Secondary | ICD-10-CM

## 2013-05-17 DIAGNOSIS — Z9861 Coronary angioplasty status: Secondary | ICD-10-CM

## 2013-05-17 HISTORY — DX: Syncope and collapse: R55

## 2013-05-17 LAB — CBC WITH DIFFERENTIAL/PLATELET
Basophils Absolute: 0 10*3/uL (ref 0.0–0.1)
Basophils Relative: 0 % (ref 0–1)
EOS PCT: 2 % (ref 0–5)
Eosinophils Absolute: 0.1 10*3/uL (ref 0.0–0.7)
HCT: 35.3 % — ABNORMAL LOW (ref 39.0–52.0)
Hemoglobin: 11.9 g/dL — ABNORMAL LOW (ref 13.0–17.0)
LYMPHS ABS: 1.3 10*3/uL (ref 0.7–4.0)
Lymphocytes Relative: 17 % (ref 12–46)
MCH: 31.2 pg (ref 26.0–34.0)
MCHC: 33.7 g/dL (ref 30.0–36.0)
MCV: 92.4 fL (ref 78.0–100.0)
MONO ABS: 1 10*3/uL (ref 0.1–1.0)
Monocytes Relative: 14 % — ABNORMAL HIGH (ref 3–12)
Neutro Abs: 5.2 10*3/uL (ref 1.7–7.7)
Neutrophils Relative %: 67 % (ref 43–77)
Platelets: 152 10*3/uL (ref 150–400)
RBC: 3.82 MIL/uL — AB (ref 4.22–5.81)
RDW: 14.3 % (ref 11.5–15.5)
WBC: 7.7 10*3/uL (ref 4.0–10.5)

## 2013-05-17 LAB — BASIC METABOLIC PANEL
BUN: 29 mg/dL — AB (ref 6–23)
CO2: 25 meq/L (ref 19–32)
Calcium: 9.4 mg/dL (ref 8.4–10.5)
Chloride: 100 mEq/L (ref 96–112)
Creatinine, Ser: 1.38 mg/dL — ABNORMAL HIGH (ref 0.50–1.35)
GFR calc Af Amer: 53 mL/min — ABNORMAL LOW (ref >90)
GFR calc non Af Amer: 46 mL/min — ABNORMAL LOW (ref >90)
GLUCOSE: 105 mg/dL — AB (ref 70–99)
Potassium: 4 mEq/L (ref 3.7–5.3)
Sodium: 140 mEq/L (ref 137–147)

## 2013-05-17 LAB — I-STAT TROPONIN, ED: Troponin i, poc: 0.02 ng/mL (ref 0.00–0.08)

## 2013-05-17 LAB — PRO B NATRIURETIC PEPTIDE: Pro B Natriuretic peptide (BNP): 6425 pg/mL — ABNORMAL HIGH (ref 0–450)

## 2013-05-17 MED ORDER — FUROSEMIDE 20 MG PO TABS: 20.0000 mg | ORAL_TABLET | Freq: Every day | ORAL | Status: DC

## 2013-05-17 MED ORDER — MORPHINE SULFATE 4 MG/ML IJ SOLN
4.0000 mg | Freq: Once | INTRAMUSCULAR | Status: AC
  Administered 2013-05-17: 4 mg via INTRAVENOUS
  Filled 2013-05-17: qty 1

## 2013-05-17 MED ORDER — METOCLOPRAMIDE HCL 5 MG/ML IJ SOLN
10.0000 mg | Freq: Once | INTRAMUSCULAR | Status: AC
  Administered 2013-05-17: 10 mg via INTRAVENOUS
  Filled 2013-05-17: qty 2

## 2013-05-17 MED ORDER — IOHEXOL 350 MG/ML SOLN
100.0000 mL | Freq: Once | INTRAVENOUS | Status: AC | PRN
  Administered 2013-05-17: 100 mL via INTRAVENOUS

## 2013-05-17 MED ORDER — FUROSEMIDE 10 MG/ML IJ SOLN
40.0000 mg | Freq: Once | INTRAMUSCULAR | Status: AC
  Administered 2013-05-17: 40 mg via INTRAVENOUS
  Filled 2013-05-17: qty 4

## 2013-05-17 MED ORDER — DIPHENHYDRAMINE HCL 50 MG/ML IJ SOLN
25.0000 mg | Freq: Once | INTRAMUSCULAR | Status: AC
  Administered 2013-05-17: 25 mg via INTRAVENOUS
  Filled 2013-05-17: qty 1

## 2013-05-17 MED ORDER — LORAZEPAM 0.5 MG PO TABS: 0.5000 mg | ORAL_TABLET | Freq: Three times a day (TID) | ORAL | Status: DC | PRN

## 2013-05-17 NOTE — ED Notes (Signed)
Called CT.  

## 2013-05-17 NOTE — Patient Instructions (Signed)
Heart Failure °Heart failure is a condition in which the heart has trouble pumping blood. This means your heart does not pump blood efficiently for your body to work well. In some cases of heart failure, fluid may back up into your lungs or you may have swelling (edema) in your lower legs. Heart failure is usually a long-term (chronic) condition. It is important for you to take good care of yourself and follow your caregiver's treatment plan. °CAUSES  °Some health conditions can cause heart failure. Those health conditions include: °· High blood pressure (hypertension) causes the heart muscle to work harder than normal. When pressure in the blood vessels is high, the heart needs to pump (contract) with more force in order to circulate blood throughout the body. High blood pressure eventually causes the heart to become stiff and weak. °· Coronary artery disease (CAD) is the buildup of cholesterol and fat (plaque) in the arteries of the heart. The blockage in the arteries deprives the heart muscle of oxygen and blood. This can cause chest pain and may lead to a heart attack. High blood pressure can also contribute to CAD. °· Heart attack (myocardial infarction) occurs when 1 or more arteries in the heart become blocked. The loss of oxygen damages the muscle tissue of the heart. When this happens, part of the heart muscle dies. The injured tissue does not contract as well and weakens the heart's ability to pump blood. °· Abnormal heart valves can cause heart failure when the heart valves do not open and close properly. This makes the heart muscle pump harder to keep the blood flowing. °· Heart muscle disease (cardiomyopathy or myocarditis) is damage to the heart muscle from a variety of causes. These can include drug or alcohol abuse, infections, or unknown reasons. These can increase the risk of heart failure. °· Lung disease makes the heart work harder because the lungs do not work properly. This can cause a strain  on the heart, leading it to fail. °· Diabetes increases the risk of heart failure. High blood sugar contributes to high fat (lipid) levels in the blood. Diabetes can also cause slow damage to tiny blood vessels that carry important nutrients to the heart muscle. When the heart does not get enough oxygen and food, it can cause the heart to become weak and stiff. This leads to a heart that does not contract efficiently. °· Other conditions can contribute to heart failure. These include abnormal heart rhythms, thyroid problems, and low blood counts (anemia). °Certain unhealthy behaviors can increase the risk of heart failure. Those unhealthy behaviors include: °· Being overweight. °· Smoking or chewing tobacco. °· Eating foods high in fat and cholesterol. °· Abusing illicit drugs or alcohol. °· Lacking physical activity. °SYMPTOMS  °Heart failure symptoms may vary and can be hard to detect. Symptoms may include: °· Shortness of breath with activity, such as climbing stairs. °· Persistent cough. °· Swelling of the feet, ankles, legs, or abdomen. °· Unexplained weight gain. °· Difficulty breathing when lying flat (orthopnea). °· Waking from sleep because of the need to sit up and get more air. °· Rapid heartbeat. °· Fatigue and loss of energy. °· Feeling lightheaded, dizzy, or close to fainting. °· Loss of appetite. °· Nausea. °· Increased urination during the night (nocturia). °DIAGNOSIS  °A diagnosis of heart failure is based on your history, symptoms, physical examination, and diagnostic tests. °Diagnostic tests for heart failure may include: °· Echocardiography. °· Electrocardiography. °· Chest X-ray. °· Blood tests. °· Exercise   stress test. °· Cardiac angiography. °· Radionuclide scans. °TREATMENT  °Treatment is aimed at managing the symptoms of heart failure. Medicines, behavioral changes, or surgical intervention may be necessary to treat heart failure. °· Medicines to help treat heart failure may  include: °· Angiotensin-converting enzyme (ACE) inhibitors. This type of medicine blocks the effects of a blood protein called angiotensin-converting enzyme. ACE inhibitors relax (dilate) the blood vessels and help lower blood pressure. °· Angiotensin receptor blockers. This type of medicine blocks the actions of a blood protein called angiotensin. Angiotensin receptor blockers dilate the blood vessels and help lower blood pressure. °· Water pills (diuretics). Diuretics cause the kidneys to remove salt and water from the blood. The extra fluid is removed through urination. This loss of extra fluid lowers the volume of blood the heart pumps. °· Beta blockers. These prevent the heart from beating too fast and improve heart muscle strength. °· Digitalis. This increases the force of the heartbeat. °· Healthy behavior changes include: °· Obtaining and maintaining a healthy weight. °· Stopping smoking or chewing tobacco. °· Eating heart healthy foods. °· Limiting or avoiding alcohol. °· Stopping illicit drug use. °· Physical activity as directed by your caregiver. °· Surgical treatment for heart failure may include: °· A procedure to open blocked arteries, repair damaged heart valves, or remove damaged heart muscle tissue. °· A pacemaker to improve heart muscle function and control certain abnormal heart rhythms. °· An internal cardioverter defibrillator to treat certain serious abnormal heart rhythms. °· A left ventricular assist device to assist the pumping ability of the heart. °HOME CARE INSTRUCTIONS  °· Take your medicine as directed by your caregiver. Medicines are important in reducing the workload of your heart, slowing the progression of heart failure, and improving your symptoms. °· Do not stop taking your medicine unless directed by your caregiver. °· Do not skip any dose of medicine. °· Refill your prescriptions before you run out of medicine. Your medicines are needed every day. °· Take over-the-counter  medicine only as directed by your caregiver or pharmacist. °· Engage in moderate physical activity if directed by your caregiver. Moderate physical activity can benefit some people. The elderly and people with severe heart failure should consult with a caregiver for physical activity recommendations. °· Eat heart healthy foods. Food choices should be free of trans fat and low in saturated fat, cholesterol, and salt (sodium). Healthy choices include fresh or frozen fruits and vegetables, fish, lean meats, legumes, fat-free or low-fat dairy products, and whole grain or high fiber foods. Talk to a dietitian to learn more about heart healthy foods. °· Limit sodium if directed by your caregiver. Sodium restriction may reduce symptoms of heart failure in some people. Talk to a dietitian to learn more about heart healthy seasonings. °· Use healthy cooking methods. Healthy cooking methods include roasting, grilling, broiling, baking, poaching, steaming, or stir-frying. Talk to a dietitian to learn more about healthy cooking methods. °· Limit fluids if directed by your caregiver. Fluid restriction may reduce symptoms of heart failure in some people. °· Weigh yourself every day. Daily weights are important in the early recognition of excess fluid. You should weigh yourself every morning after you urinate and before you eat breakfast. Wear the same amount of clothing each time you weigh yourself. Record your daily weight. Provide your caregiver with your weight record. °· Monitor and record your blood pressure if directed by your caregiver. °· Check your pulse if directed by your caregiver. °· Lose weight if directed   by your caregiver. Weight loss may reduce symptoms of heart failure in some people. °· Stop smoking or chewing tobacco. Nicotine makes your heart work harder by causing your blood vessels to constrict. Do not use nicotine gum or patches before talking to your caregiver. °· Schedule and attend follow-up visits as  directed by your caregiver. It is important to keep all your appointments. °· Limit alcohol intake to no more than 1 drink per day for nonpregnant women and 2 drinks per day for men. Drinking more than that is harmful to your heart. Tell your caregiver if you drink alcohol several times a week. Talk with your caregiver about whether alcohol is safe for you. If your heart has already been damaged by alcohol or you have severe heart failure, drinking alcohol should be stopped completely. °· Stop illicit drug use. °· Stay up-to-date with immunizations. It is especially important to prevent respiratory infections through current pneumococcal and influenza immunizations. °· Manage other health conditions such as hypertension, diabetes, thyroid disease, or abnormal heart rhythms as directed by your caregiver. °· Learn to manage stress. °· Plan rest periods when fatigued. °· Learn strategies to manage high temperatures. If the weather is extremely hot: °· Avoid vigorous physical activity. °· Use air conditioning or fans or seek a cooler location. °· Avoid caffeine and alcohol. °· Wear loose-fitting, lightweight, and light-colored clothing. °· Learn strategies to manage cold temperatures. If the weather is extremely cold: °· Avoid vigorous physical activity. °· Layer clothes. °· Wear mittens or gloves, a hat, and a scarf when going outside. °· Avoid alcohol. °· Obtain ongoing education and support as needed. °· Participate or seek rehabilitation as needed to maintain or improve independence and quality of life. °SEEK MEDICAL CARE IF:  °· Your weight increases by 03 lb/1.4 kg in 1 day or 05 lb/2.3 kg in a week. °· You have increasing shortness of breath that is unusual for you. °· You are unable to participate in your usual physical activities. °· You tire easily. °· You cough more than normal, especially with physical activity. °· You have any or more swelling in areas such as your hands, feet, ankles, or abdomen. °· You  are unable to sleep because it is hard to breathe. °· You feel like your heart is beating fast (palpitations). °· You become dizzy or lightheaded upon standing up. °SEEK IMMEDIATE MEDICAL CARE IF:  °· You have difficulty breathing. °· There is a change in mental status such as decreased alertness or difficulty with concentration. °· You have a pain or discomfort in your chest. °· You have an episode of fainting (syncope). °MAKE SURE YOU:  °· Understand these instructions. °· Will watch your condition. °· Will get help right away if you are not doing well or get worse. °Document Released: 01/23/2005 Document Revised: 05/20/2012 Document Reviewed: 02/15/2012 °ExitCare® Patient Information ©2014 [employer]. ° °

## 2013-05-17 NOTE — ED Notes (Signed)
Patient transported to CT 

## 2013-05-17 NOTE — Progress Notes (Addendum)
[facility] [address] [place] [address] [phone]  Date:  05/17/2013   Name:  Jonathan Acevedo   DOB:  05/27/1928   MRN:  [id number]  PCP:  Florina Ou, MD    Chief Complaint: Shortness of Breath and Nausea   History of Present Illness:  Jonathan Acevedo is a 80 y.o. very pleasant male patient who presents with the following:  Seen this week for shortness of breath and found to be in trigeminy.  Awaiting appt with cardiology.  Has peripheral edema that is stable and when short of breath has panic attack.  No chest pain or sensation of rapid heart beat.  Is off HCTZ for a couple months and in that time he has experienced increased peripheral edema.  Stable exercise tolerance but is limited by boot.  No improvement with over the counter medications or other home remedies. Denies other complaint or health concern today.   Patient Active Problem List   Diagnosis Date Noted  . Varicose veins of lower extremities with other complications 40/34/7425  . Unstable angina 11/16/2012  . Chronic venous insufficiency 07/29/2012  . Precordial pain - MI ruled out, outpatient stress test planned 05/04/2012  . 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 Hypercalcemia 12/26/2011  . Chronic systolic CHF (congestive heart failure) 07/03/2011  . Primary hyperparathyroidism 07/03/2011  . Ischemic cardiomyopathy 11/29/2010  . Myocardial infarction   . Coronary artery disease   . Hypertension   . Renal insufficiency   . Aortic stenosis   . Rectal polyp   . Hypercholesteremia     Past Medical History  Diagnosis Date  . Myocardial infarction 1998  . Coronary artery disease     w remote anterior myocardial infarction  . Hypertension   . Renal insufficiency   . Aortic stenosis   . Rectal polyp   . Hypercholesteremia   . Kidney stones   . 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 Hypercalcemia 12/26/2011  . Afib     Past Surgical History  Procedure Laterality Date  . Cardiac  catheterization  08/18/2008    LMain 20, LAD stent 20 ISR, CFX 10, RCA 20, EF 45  . Umbilical hernia repair    . Inguinal hernia repair      left  . Orif femur fracture      right  . Cataract extraction    . Coronary stent placement  07/19/1996    Proximal LAD  . Parathyroidectomy      History  Substance Use Topics  . Smoking status: Former Smoker -- 1.00 packs/day for 45 years    Types: Cigarettes    Quit date: 07/19/1996  . Smokeless tobacco: Never Used  . Alcohol Use: No    History reviewed. No pertinent family history.  Allergies  Allergen Reactions  . Risedronate Sodium Other (See Comments)    Reaction=unknown    Medication list has been reviewed and updated.  Current Outpatient Prescriptions on File Prior to Visit  Medication Sig Dispense Refill  . aspirin 325 MG EC tablet Take 325 mg by mouth every morning.       . enalapril (VASOTEC) 2.5 MG tablet Take 1/2 tablet daily  30 tablet  6  . ezetimibe-simvastatin (VYTORIN) 10-20 MG per tablet Take 1 tablet by mouth daily.  30 tablet  6  . HYDROcodone-acetaminophen (NORCO) 5-325 MG per tablet Take 1-2 tablets by mouth every 6 (six) hours as needed for pain.  20 tablet  0  . nitroGLYCERIN (NITROSTAT)  0.4 MG SL tablet Place 1 tablet (0.4 mg total) under the tongue every 5 (five) minutes as needed for chest pain.  25 tablet  3  . Oyster Shell (OYSTER CALCIUM) 500 MG TABS Take 500 mg of elemental calcium by mouth 2 (two) times daily.      Marland Kitchen amoxicillin (AMOXIL) 875 MG tablet Take 1 tablet (875 mg total) by mouth 2 (two) times daily.  20 tablet  0  . Cholecalciferol (VITAMIN D PO) Take by mouth.      Marland Kitchen HYDROcodone-acetaminophen (NORCO/VICODIN) 5-325 MG per tablet Take 1 tablet by mouth every 6 (six) hours as needed for moderate pain.  20 tablet  0   No current facility-administered medications on file prior to visit.    Review of Systems:  As per HPI, otherwise negative.    Physical Examination: Filed Vitals:    05/17/13 0841  BP: 120/62  Pulse: 48  Temp: 97.6 F (36.4 C)  Resp: 16   There were no vitals filed for this visit. There is no weight on file to calculate BMI. Ideal Body Weight:    GEN: obese, NAD, Non-toxic, A & O x 3 HEENT: Atraumatic, Normocephalic. Neck supple. No masses, No LAD. Ears and Nose: No external deformity. CV: RRR, No M/G/R. No JVD. No thrill. No extra heart sounds. PULM: CTA B, no wheezes, crackles, rhonchi. No retractions. No resp. distress. No accessory muscle use. ABD: S, NT, ND, +BS. No rebound. No HSM. EXTR: No c/c.  Stable dependent edema NEURO Normal gait.  PSYCH: Normally interactive. Conversant. Not depressed or anxious appearing.  Calm demeanor.    Assessment and Plan: CHF Trigeminal pvc [facility] Tuesday To er for chest pain or increased shortness of breath  Signed,  Ellison Carwin, MD   [facility] reading (PRIMARY) by  Dr. Ouida Sills. No change from prior .

## 2013-05-17 NOTE — ED Notes (Signed)
Pt is in a gown and on the monitor. 

## 2013-05-17 NOTE — Consult Note (Signed)
Cardiology Consultation Note  Patient ID: Jonathan Acevedo, MRN: [id number], DOB/AGE: August 20, 1928 80 y.o. Admit date: 05/17/2013   Date of Consult: 05/17/2013 Primary Physician: Florina Ou, MD Primary Cardiologist: [name]   Chief Complaint: SOB  Reason for Consult: CHF  HPI: 80 yr old male with hx of CAD with remote MI and prior stent to the LAD, HTN, CKD, moderate AS and HLD who presents with LE edema and SOB   Pt states that for the past 2 weeks he has been experiencing worsening orthopnea to the point that he thought he could not wait for  his cardiology clinic appointment on Teusday and came into the ER. States that he has been otherwise in good health , swims and runs 1 mile until about a month ago. He had an episode while walking back from picking his mail when he became dizzy / lightheaded and had syncope and broke his right leg. Per pt he went to OSH ER and was told that he had PVC's on EKG and d/c . Thereafter at home he began to feel the orthopnea, PND , LE edema and SOB with exertion . He was prescribed lasix by his PCP that helped his edema . Additionally his vasoetc dose was decreased and his flomax was d/c to help with his dizziness.     Past Medical History  Diagnosis Date  . Myocardial infarction 1998  . Coronary artery disease     w remote anterior myocardial infarction  . Hypertension   . Renal insufficiency   . Aortic stenosis   . Rectal polyp   . Hypercholesteremia   . Kidney stones   . 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 Hypercalcemia 12/26/2011  . Afib       Most Recent Cardiac Studies: Echo 11/2012 - Left ventricle: There is akinesis of the mid anteroseptal, distal septal and inferior wall and true apex. The cavity size was normal. There was mild concentric hypertrophy. Systolic function was mildly to moderately reduced. The estimated ejection fraction was in the range of 40% to 45%. Doppler parameters are consistent with abnormal left ventricular  relaxation (grade 1 diastolic dysfunction). - Aortic valve: Cusp separation was reduced. Valve mobility was restricted. There was moderate stenosis. Mild regurgitation. Valve area: 1.3 cm^2 (0.7 cm2/m2). - Left atrium: The atrium was mildly dilated. - Right ventricle: The cavity size was mildly dilated. - Right atrium: The atrium was mildly dilated. - Atrial septum: A septal defect cannot be excluded. Impressions:  - The findings are unchanged from the prior study on 12/07/2010. There are no new wall motion abnormalities and the severity of the aortic valve is still moderate. Normal right sided pressures.   Stress test 05/2012 Impression  Exercise Capacity: Fair exercise capacity. Lexiscan was given due to inability to achieve target heart rate.  BP Response: Normal blood pressure response.  Clinical Symptoms: Back pain, dyspnea.  ECG Impression: No significant ST segment change suggestive of ischemia.  Comparison with Prior Nuclear Study: No images to compare  Overall Impression: Intermediate stress nuclear study. Fixed, large, severe mid to apical anterior, apical inferior, mid to apical anteroseptal, and true apex perfusion defect. This suggests prior infarction with minimal peri-infarct ischemia.  LV Ejection Fraction: 44%. LV Wall Motion: Peri-apical akinesis.       Surgical History:  Past Surgical History  Procedure Laterality Date  . Cardiac catheterization  08/18/2008    LMain 20, LAD stent 20 ISR, CFX 10, RCA 20, EF 45  . Umbilical hernia repair    .  Inguinal hernia repair      left  . Orif femur fracture      right  . Cataract extraction    . Coronary stent placement  07/19/1996    Proximal LAD  . Parathyroidectomy       Home Meds: Prior to Admission medications   Medication Sig Start Date End Date Taking? Authorizing Provider  aspirin 325 MG EC tablet Take 325 mg by mouth every morning.    Yes Historical Provider, MD  Cholecalciferol (VITAMIN D PO) Take by  mouth.   Yes Historical Provider, MD  enalapril (VASOTEC) 2.5 MG tablet Take 1/2 tablet daily 05/14/13  Yes Peter M Martinique, MD  ezetimibe-simvastatin (VYTORIN) 10-20 MG per tablet Take 1 tablet by mouth daily. 05/07/13  Yes Peter M Martinique, MD  furosemide (LASIX) 20 MG tablet Take 1 tablet (20 mg total) by mouth daily. 05/17/13  Yes Ellison Carwin, MD  HYDROcodone-acetaminophen (NORCO) 5-325 MG per tablet Take 1-2 tablets by mouth every 6 (six) hours as needed for pain. 11/01/11  Yes Kathalene Frames, MD  HYDROcodone-acetaminophen (NORCO/VICODIN) 5-325 MG per tablet Take 1 tablet by mouth every 6 (six) hours as needed for moderate pain. 05/13/13  Yes Leandrew Koyanagi, MD  nitroGLYCERIN (NITROSTAT) 0.4 MG SL tablet Place 1 tablet (0.4 mg total) under the tongue every 5 (five) minutes as needed for chest pain. 12/26/12  Yes Peter M Martinique, MD  Oyster Shell (OYSTER CALCIUM) 500 MG TABS Take 500 mg of elemental calcium by mouth 2 (two) times daily.   Yes Historical Provider, MD    Inpatient Medications:     Allergies:  Allergies  Allergen Reactions  . Risedronate Sodium Other (See Comments)    Reaction=unknown    History   Social History  . Marital Status: Married    Spouse Name: N/A    Number of Children: N/A  . Years of Education: N/A   Occupational History  . [employer]     retired   Social History Main Topics  . Smoking status: Former Smoker -- 1.00 packs/day for 45 years    Types: Cigarettes    Quit date: 07/19/1996  . Smokeless tobacco: Never Used  . Alcohol Use: No  . Drug Use: No  . Sexual Activity: Not on file   Other Topics Concern  . Not on file   Social History Narrative   Lives with wife, generally exercises regularly.     No family history on file.   Review of Systems:General: negative for chills, fever, night sweats or weight changes.  Cardiovascular: see HPI  Dermatological: negative for rash Respiratory: negative for cough or wheezing, also see HPI  Urologic:  negative for hematuria Abdominal: negative for nausea, vomiting, diarrhea, bright red blood per rectum, melena, or hematemesis Neurologic: negative for visual changes, syncope, or dizziness All other systems reviewed and are otherwise negative except as noted above.  Labs: No results found for this basename: CKTOTAL, CKMB, TROPONINI,  in the last 72 hours Lab Results  Component Value Date   WBC 7.7 05/17/2013   HGB 11.9* 05/17/2013   HCT 35.3* 05/17/2013   MCV 92.4 05/17/2013   PLT 152 05/17/2013    Recent Labs Lab 05/15/13 1239 05/17/13 1930  NA 140 140  K 4.3 4.0  CL 106 100  CO2 26 25  BUN 27* 29*  CREATININE 1.38* 1.38*  CALCIUM 9.1 9.4  PROT 6.3  --   BILITOT 0.7  --   ALKPHOS 70  --  ALT 13  --   AST 16  --   GLUCOSE 97 105*   Lab Results  Component Value Date   CHOL 133 11/17/2012   HDL 54 11/17/2012   LDLCALC 58 11/17/2012   TRIG 105 11/17/2012   No results found for this basename: DDIMER   Trp I 0.02 BNP 6425  Radiology/Studies:  Dg Chest 2 View  05/17/2013   CLINICAL DATA:  shortness of breath  EXAM: CHEST  2 VIEW  COMPARISON:  DG CHEST 2V dated 05/02/2013  FINDINGS: Low lung volumes. Cardiac silhouette moderately enlarged minimal atelectasis versus scarring left lung base. No infiltrates nor regions of consolidation. Osseous structures unremarkable.  IMPRESSION: Stable minimal scarring left lung base. No acute cardiopulmonary disease   Electronically Signed   By: Margaree Mackintosh M.D.   On: 05/17/2013 18:04   Dg Ankle Complete Right  05/13/2013   CLINICAL DATA:  Right ankle injury.  EXAM: RIGHT ANKLE - COMPLETE 3+ VIEW  COMPARISON:  None.  FINDINGS: Mildly displaced oblique fracture distal right fibula is noted. The distal tibia and talus appear intact. Joint spaces are intact.  IMPRESSION: Mildly displaced distal right fibular oblique fracture.   Electronically Signed   By: Sabino Dick M.D.   On: 05/13/2013 12:51   Dg Foot Complete Right  05/13/2013   CLINICAL  DATA:  Right foot injury  EXAM: RIGHT FOOT COMPLETE - 3+ VIEW  COMPARISON:  None.  FINDINGS: Three views of the right foot submitted. No acute fracture or subluxation. Mild dorsal spurring of navicular. Small plantar spur of calcaneus. Mild degenerative changes first metatarsal phalangeal joint.  IMPRESSION: No acute fracture or subluxation. Mild degenerative changes as described above.   Electronically Signed   By: Lahoma Crocker M.D.   On: 05/13/2013 12:49    EKG: 05/17/2013 NSR, LAFB,  anterospetal qwaves  Physical Exam: Blood pressure 121/83, pulse 87, temperature 97.7 F (36.5 C), temperature source Oral, resp. rate 24, height 5\' 10"  (1.778 m), weight 95.255 kg (210 lb), SpO2 98.00%. General: Well developed, well nourished, in no acute distress. Head: Normocephalic, atraumatic, sclera non-icteric, no xanthomas, nares are without discharge.  Neck: Negative for carotid bruits. JVD not elevated. Lungs: Clear bilaterally to auscultation without wheezes, rales, or rhonchi. Breathing is unlabored. Heart: RRR with S1 S2. 3/6 mid peaking harsh systolic mumur at the base of the heart. Abdomen: Soft, non-tender, non-distended with normoactive bowel sounds. No hepatomegaly. No rebound/guarding. No obvious abdominal masses. Msk:  Strength and tone appear normal for age. Extremities: No clubbing or cyanosis. Right leg in brace. Left leg skin indurated with 1+ edema  Neuro: Alert and oriented X 3. No facial asymmetry. No focal deficit. Moves all extremities spontaneously. Psych:  Responds to questions appropriately with a normal affect.     Assessment and Plan:   CHF - systolic. Acute on chronic  AS- moderate  HTN - bp running low    Plan   Repeat Echo to see if LV function has declined further or AS progressed  Monitor on tele  IV diuretic prn  Based on LV function and persistence of PVC  will decide on need for ACE/ B-blocker therapy   Signed, Grafton Folk M.D 05/17/2013, 9:25 PM

## 2013-05-17 NOTE — ED Provider Notes (Signed)
CSN: [id number]     Arrival date & time 05/17/13  1803 History   First MD Initiated Contact with Patient 05/17/13 1851     Chief Complaint  Patient presents with  . Chest Pain     (Consider location/radiation/quality/duration/timing/severity/associated sxs/prior Treatment) The history is provided by the patient.  Jonathan Acevedo is a 80 y.o. male hx of CAD s/p stents, HTN here with shortness of breath, syncope. Patient syncopized last week and had a right ankle fracture. As per his orthopedic doctor is nonoperative and he currently has a boot on. Over the last week he has been having intermittent shortness of breath worse with laying down. He also has some exertional shortness of breath as well. He went to see his primary care doctor and was given Lasix and had a normal chest x-ray was sent in for possible heart failure (not fluid around his heart). He does notice some swelling of his legs.   Past Medical History  Diagnosis Date  . Myocardial infarction 1998  . Coronary artery disease     w remote anterior myocardial infarction  . Hypertension   . Renal insufficiency   . Aortic stenosis   . Rectal polyp   . Hypercholesteremia   . Kidney stones   . 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 Hypercalcemia 12/26/2011  . Afib    Past Surgical History  Procedure Laterality Date  . Cardiac catheterization  08/18/2008    LMain 20, LAD stent 20 ISR, CFX 10, RCA 20, EF 45  . Umbilical hernia repair    . Inguinal hernia repair      left  . Orif femur fracture      right  . Cataract extraction    . Coronary stent placement  07/19/1996    Proximal LAD  . Parathyroidectomy     No family history on file. History  Substance Use Topics  . Smoking status: Former Smoker -- 1.00 packs/day for 45 years    Types: Cigarettes    Quit date: 07/19/1996  . Smokeless tobacco: Never Used  . Alcohol Use: No    Review of Systems  Respiratory: Positive for shortness of breath.   Musculoskeletal:   Leg swelling   All other systems reviewed and are negative.     Allergies  Risedronate sodium  Home Medications   Current Outpatient Rx  Name  Route  Sig  Dispense  Refill  . aspirin 325 MG EC tablet   Oral   Take 325 mg by mouth every morning.          . Cholecalciferol (VITAMIN D PO)   Oral   Take by mouth.         . enalapril (VASOTEC) 2.5 MG tablet      Take 1/2 tablet daily   30 tablet   6   . ezetimibe-simvastatin (VYTORIN) 10-20 MG per tablet   Oral   Take 1 tablet by mouth daily.   30 tablet   6   . furosemide (LASIX) 20 MG tablet   Oral   Take 1 tablet (20 mg total) by mouth daily.   30 tablet   3   . HYDROcodone-acetaminophen (NORCO) 5-325 MG per tablet   Oral   Take 1-2 tablets by mouth every 6 (six) hours as needed for pain.   20 tablet   0   . HYDROcodone-acetaminophen (NORCO/VICODIN) 5-325 MG per tablet   Oral   Take 1 tablet by mouth every 6 (six) hours as needed for  moderate pain.   20 tablet   0   . nitroGLYCERIN (NITROSTAT) 0.4 MG SL tablet   Sublingual   Place 1 tablet (0.4 mg total) under the tongue every 5 (five) minutes as needed for chest pain.   25 tablet   3   . Oyster Shell (OYSTER CALCIUM) 500 MG TABS   Oral   Take 500 mg of elemental calcium by mouth 2 (two) times daily.          BP 148/94  Pulse 72  Temp(Src) 97.7 F (36.5 C) (Oral)  Resp 16  Ht 5\' 10"  (1.778 m)  Wt 210 lb (95.255 kg)  BMI 30.13 kg/m2  SpO2 96% Physical Exam  Nursing note and vitals reviewed. Constitutional: He is oriented to person, place, and time. He appears well-developed and well-nourished.  HENT:  Head: Normocephalic.  Mouth/Throat: Oropharynx is clear and moist.  Eyes: Conjunctivae and EOM are normal. Pupils are equal, round, and reactive to light.  Neck: Normal range of motion. Neck supple.  Cardiovascular: Regular rhythm and normal heart sounds.   2/6 systolic murmur (history of this)  Pulmonary/Chest: Effort normal and breath  sounds normal. No respiratory distress. He has no wheezes.  No crackles   Abdominal: Soft. Bowel sounds are normal. He exhibits no distension. There is no tenderness. There is no rebound.  Musculoskeletal: Normal range of motion.  1+ edema bilaterally. R ankle boot in place   Neurological: He is alert and oriented to person, place, and time. No cranial nerve deficit. Coordination normal.  Skin: Skin is warm and dry.  Psychiatric: He has a normal mood and affect. His behavior is normal. Judgment and thought content normal.    ED Course  Procedures (including critical care time) Labs Review Labs Reviewed  CBC WITH DIFFERENTIAL - Abnormal; Notable for the following:    RBC 3.82 (*)    Hemoglobin 11.9 (*)    HCT 35.3 (*)    Monocytes Relative 14 (*)    All other components within normal limits  PRO B NATRIURETIC PEPTIDE - Abnormal; Notable for the following:    Pro B Natriuretic peptide (BNP) 6425.0 (*)    All other components within normal limits  BASIC METABOLIC PANEL - Abnormal; Notable for the following:    Glucose, Bld 105 (*)    BUN 29 (*)    Creatinine, Ser 1.38 (*)    GFR calc non Af Amer 46 (*)    GFR calc Af Amer 53 (*)    All other components within normal limits  I-STAT TROPOININ, ED   Imaging Review Dg Chest 2 View  05/17/2013   CLINICAL DATA:  shortness of breath  EXAM: CHEST  2 VIEW  COMPARISON:  DG CHEST 2V dated 05/02/2013  FINDINGS: Low lung volumes. Cardiac silhouette moderately enlarged minimal atelectasis versus scarring left lung base. No infiltrates nor regions of consolidation. Osseous structures unremarkable.  IMPRESSION: Stable minimal scarring left lung base. No acute cardiopulmonary disease   Electronically Signed   By: Margaree Mackintosh M.D.   On: 05/17/2013 18:04   Ct Angio Chest Pe W/cm &/or Wo Cm  05/17/2013   CLINICAL DATA:  Shortness of breath, evaluate for pulmonary embolism.  EXAM: CT ANGIOGRAPHY CHEST WITH CONTRAST  TECHNIQUE: Multidetector CT imaging  of the chest was performed using the standard protocol during bolus administration of intravenous contrast. Multiplanar CT image reconstructions and MIPs were obtained to evaluate the vascular anatomy.  CONTRAST:  144mL OMNIPAQUE IOHEXOL 350 MG/ML SOLN  COMPARISON:  DG CHEST 2V dated 05/17/2013  FINDINGS: Adequate contrast opacification of the pulmonary artery's. Main pulmonary artery is not enlarged. No pulmonary arterial filling defects to the level of the subsegmental branches.  The heart is mild-to-moderately enlarged. Three vessel coronary artery calcifications. Pericardium is unremarkable. No right heart strain. Thoracic aorta is normal course and caliber, with moderate to severe calcific atherosclerosis. Low-density sub carinal lymph node measures up to 14 mm short axis with small right hilar low-density lymph nodes, to lesser extent on the left. Trace paraseptal and bullous emphysema at the lung apices though, evaluation is limited by respiratory motion. No focal consolidations or pleural effusions. Mild bronchial wall thickening, tracheobronchial tree is patent. No pneumothorax.  Small air-fluid level in the included view of the esophagus. Low-density partially imaged cysts in the liver measuring up to 2.9 cm (7 Hounsfield units) ; multiple cysts partially imaged in the kidneys. Visualized soft tissues and included osseous structures are nonsuspicious; severe degenerative change of the included cervical spine.  Review of the MIP images confirms the above findings.  IMPRESSION: No acute pulmonary embolism.  Mild to moderate cardiomegaly ; mild bronchial wall thickening could reflect bronchitis without pneumonia.  Prominent mediastinal lymph nodes could be reactive though, are nonspecific.   Electronically Signed   By: Elon Alas   On: 05/17/2013 22:54     EKG Interpretation   Date/Time:  Saturday May 17 2013 18:08:34 EDT Ventricular Rate:  78 PR Interval:  168 QRS Duration: 98 QT Interval:   386 QTC Calculation: 440 R Axis:   -42 Text Interpretation:  Normal sinus rhythm with sinus arrhythmia Left axis  deviation Incomplete right bundle branch block Minimal voltage criteria  for LVH, may be normal variant Anteroseptal infarct , age undetermined  Abnormal ECG No significant change since last tracing Confirmed by Sameka Bagent   MD, Daevon Holdren ([id number]) on 05/17/2013 6:51:34 PM      MDM   Final diagnoses:  None    Jonathan Acevedo is a 80 y.o. male hx of MI, CAD, cardiomyopathy here with SOB. Sent here for r/o CHF. CXR at the urgent care stable. However, since he does have R ankle fracture, will need to do ct angio to r/o PE. But otherwise still concerned for CHF.   11:24 PM CT showed no PE. Cardiology evaluated, thinks likely worsening AS vs CHF. Recommend echo and gentle diuresis. Recommend medicine admission.    Wandra Arthurs, MD 05/17/13 [phone]

## 2013-05-17 NOTE — H&P (Signed)
[facility] History and Physical  Patient: Jonathan Acevedo  HKV:[id number]  DOB: 10/28/1928  DOS: the patient was seen and examined on 05/17/2013 PCP: Florina Ou, MD  Chief Complaint: Shortness of breath  HPI: Jonathan Acevedo is a 80 y.o. male with Past medical history of coronary artery disease, ischemic cardiomyopathy with ejection fraction of 40%, moderate aortic stenosis, primary hyperparathyroidism. The patient presented with complaints of shortness of breath has been ongoing since last one month. He mentions that since last one month he has been having episodes of progressive worsening shortness of breath and since last one week he has progressively worsening orthopnea as well as PND. He wakes up at 2:00 coughing gasping for air. He denies any weight changes he denies any nausea or vomiting denies any abdominal distention denies any diarrhea or difficult urination. One week along with his son he was in the urgent care after having a near-syncopal episode, and right fibula fracture. He mentions that since last few months he has been having frequent episodes of dizziness which occurs when he is walking, he describes the dizziness as lightheadedness without any vertigo, it improves with rest. He saw his cardiologist but does complain who reduced his dose of enalapril and stopped his Flomax one month ago. Despite this he continues to have symptoms. He mentions other than last 1 month he has been fairly active at his baseline including walking and swimming.  The patient is coming from home. And at her baseline independent for most of his  ADL.  Review of Systems: as mentioned in the history of present illness.  A Comprehensive review of the other systems is negative.  Past Medical History  Diagnosis Date  . Myocardial infarction 1998  . Coronary artery disease     w remote anterior myocardial infarction  . Hypertension   . Renal insufficiency   . Aortic stenosis   . Rectal polyp    . Hypercholesteremia   . Kidney stones   . 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 Hypercalcemia 12/26/2011  . Afib    Past Surgical History  Procedure Laterality Date  . Cardiac catheterization  08/18/2008    LMain 20, LAD stent 20 ISR, CFX 10, RCA 20, EF 45  . Umbilical hernia repair    . Inguinal hernia repair      left  . Orif femur fracture      right  . Cataract extraction    . Coronary stent placement  07/19/1996    Proximal LAD  . Parathyroidectomy     Social History:  reports that he quit smoking about 16 years ago. His smoking use included Cigarettes. He has a 45 pack-year smoking history. He has never used smokeless tobacco. He reports that he does not drink alcohol or use illicit drugs.  Allergies  Allergen Reactions  . Risedronate Sodium Other (See Comments)    Reaction=unknown    No family history on file.  Prior to Admission medications   Medication Sig Start Date End Date Taking? Authorizing Provider  aspirin 325 MG EC tablet Take 325 mg by mouth every morning.    Yes Historical Provider, MD  Cholecalciferol (VITAMIN D PO) Take by mouth.   Yes Historical Provider, MD  enalapril (VASOTEC) 2.5 MG tablet Take 1/2 tablet daily 05/14/13  Yes Peter M Martinique, MD  ezetimibe-simvastatin (VYTORIN) 10-20 MG per tablet Take 1 tablet by mouth daily. 05/07/13  Yes Peter M Martinique, MD  furosemide (LASIX) 20 MG tablet Take 1 tablet (20 mg  total) by mouth daily. 05/17/13  Yes Ellison Carwin, MD  HYDROcodone-acetaminophen (NORCO) 5-325 MG per tablet Take 1-2 tablets by mouth every 6 (six) hours as needed for pain. 11/01/11  Yes Kathalene Frames, MD  HYDROcodone-acetaminophen (NORCO/VICODIN) 5-325 MG per tablet Take 1 tablet by mouth every 6 (six) hours as needed for moderate pain. 05/13/13  Yes Leandrew Koyanagi, MD  nitroGLYCERIN (NITROSTAT) 0.4 MG SL tablet Place 1 tablet (0.4 mg total) under the tongue every 5 (five) minutes as needed for chest pain. 12/26/12  Yes Peter M Martinique, MD  Oyster  Shell (OYSTER CALCIUM) 500 MG TABS Take 500 mg of elemental calcium by mouth 2 (two) times daily.   Yes Historical Provider, MD    Physical Exam: Filed Vitals:   05/17/13 2100 05/17/13 2115 05/17/13 2130 05/17/13 2230  BP: 131/73 139/75 104/84 148/94  Pulse: 66 73 72 72  Temp:      TempSrc:      Resp: 18 21 18 16   Height:      Weight:      SpO2: 95% 97% 95% 96%    General: Alert, Awake and Oriented to Time, Place and Person. Appear in mild distress Eyes: PERRL ENT: Oral Mucosa clear moist. Neck: no JVD Cardiovascular: S1 and S2 Present, aortic systolic radiating to carotid Murmur, Peripheral Pulses Present Respiratory: Bilateral Air entry equal and Decreased, faint basal Crackles,no wheezes Abdomen: Bowel Sound Present, Soft and Non tender Skin: no Rash Extremities: Bilateral Pedal edema, no calf tenderness Neurologic: Grossly Unremarkable.  Labs on Admission:  CBC:  Recent Labs Lab 05/15/13 1240 05/17/13 1930  WBC 7.0 7.7  NEUTROABS  --  5.2  HGB 11.0* 11.9*  HCT 35.4* 35.3*  MCV 95.5 92.4  PLT  --  152    CMP     Component Value Date/Time   NA 140 05/17/2013 1930   K 4.0 05/17/2013 1930   CL 100 05/17/2013 1930   CO2 25 05/17/2013 1930   GLUCOSE 105* 05/17/2013 1930   BUN 29* 05/17/2013 1930   CREATININE 1.38* 05/17/2013 1930   CREATININE 1.38* 05/15/2013 1239   CALCIUM 9.4 05/17/2013 1930   CALCIUM 10.5 08/17/2008 0926   PROT 6.3 05/15/2013 1239   ALBUMIN 3.8 05/15/2013 1239   AST 16 05/15/2013 1239   ALT 13 05/15/2013 1239   ALKPHOS 70 05/15/2013 1239   BILITOT 0.7 05/15/2013 1239   GFRNONAA 46* 05/17/2013 1930   GFRAA 53* 05/17/2013 1930    No results found for this basename: LIPASE, AMYLASE,  in the last 168 hours No results found for this basename: AMMONIA,  in the last 168 hours  No results found for this basename: CKTOTAL, CKMB, CKMBINDEX, TROPONINI,  in the last 168 hours BNP (last 3 results)  Recent Labs  05/17/13 1930  PROBNP 6425.0*    Radiological  Exams on Admission: Dg Chest 2 View  05/17/2013   CLINICAL DATA:  shortness of breath  EXAM: CHEST  2 VIEW  COMPARISON:  DG CHEST 2V dated 05/02/2013  FINDINGS: Low lung volumes. Cardiac silhouette moderately enlarged minimal atelectasis versus scarring left lung base. No infiltrates nor regions of consolidation. Osseous structures unremarkable.  IMPRESSION: Stable minimal scarring left lung base. No acute cardiopulmonary disease   Electronically Signed   By: Margaree Mackintosh M.D.   On: 05/17/2013 18:04   Ct Angio Chest Pe W/cm &/or Wo Cm  05/17/2013   CLINICAL DATA:  Shortness of breath, evaluate for pulmonary embolism.  EXAM: CT ANGIOGRAPHY  CHEST WITH CONTRAST  TECHNIQUE: Multidetector CT imaging of the chest was performed using the standard protocol during bolus administration of intravenous contrast. Multiplanar CT image reconstructions and MIPs were obtained to evaluate the vascular anatomy.  CONTRAST:  125mL OMNIPAQUE IOHEXOL 350 MG/ML SOLN  COMPARISON:  DG CHEST 2V dated 05/17/2013  FINDINGS: Adequate contrast opacification of the pulmonary artery's. Main pulmonary artery is not enlarged. No pulmonary arterial filling defects to the level of the subsegmental branches.  The heart is mild-to-moderately enlarged. Three vessel coronary artery calcifications. Pericardium is unremarkable. No right heart strain. Thoracic aorta is normal course and caliber, with moderate to severe calcific atherosclerosis. Low-density sub carinal lymph node measures up to 14 mm short axis with small right hilar low-density lymph nodes, to lesser extent on the left. Trace paraseptal and bullous emphysema at the lung apices though, evaluation is limited by respiratory motion. No focal consolidations or pleural effusions. Mild bronchial wall thickening, tracheobronchial tree is patent. No pneumothorax.  Small air-fluid level in the included view of the esophagus. Low-density partially imaged cysts in the liver measuring up to 2.9 cm (7  Hounsfield units) ; multiple cysts partially imaged in the kidneys. Visualized soft tissues and included osseous structures are nonsuspicious; severe degenerative change of the included cervical spine.  Review of the MIP images confirms the above findings.  IMPRESSION: No acute pulmonary embolism.  Mild to moderate cardiomegaly ; mild bronchial wall thickening could reflect bronchitis without pneumonia.  Prominent mediastinal lymph nodes could be reactive though, are nonspecific.   Electronically Signed   By: Elon Alas   On: 05/17/2013 22:54    EKG: Independently reviewed. normal sinus rhythm, nonspecific ST and T waves changes, LAFB chronic.  Assessment/Plan Principal Problem:   Acute on chronic combined systolic and diastolic CHF (congestive heart failure) Active Problems:   Coronary artery disease   Hypertension   Aortic stenosis   Ischemic cardiomyopathy   Primary hyperparathyroidism   1. Acute on chronic combined systolic and diastolic CHF (congestive heart failure) The patient is presenting with complaints of shortness of breath and dizziness, he has orthopnea and PND, he denies any weight gain but has significantly elevated proBNP than his baseline. Chest x-ray is clear. CT scan is negative for any pulmonary embolism. CT also suggest possible bronchitis but he does not have any symptoms of that and has been treated with antibiotics for that one week ago for 10 days. Is most likely etiology for patient's current presentation is acute on chronic combined systolic and diastolic CHF. Possible cause for worsening of his CHF is progressive worsening of aortic stenosis, ACS, progressive worsening of his systolic dysfunction. Patient continues to remain symptomatic with dizziness despite reducing his dose of ACE inhibitor and stop his Flomax. Initial EKG and troponins are negative for any acute anemia. cardiology has evaluated the patient and patient has already been given one dose of IV  Lasix.  with that the patient will be admitted to telemetry. I will obtain serial troponin to rule out ACS, we will also obtain echocardiogram in the morning to monitor her ejection fraction as well as aortic valve. Further dosing of diuretics as well as other medication optimization including additional beta blocker and increasing the dose of ACE inhibitor per cardiology.  2. aortic stenosis Patient has moderate aortic stenosis with low EF, we will get echocardiogram, holding to aggressive diuresis, we will follow cardiology recommendation, he may need to have further workup of differentiated pseudo-aortic stenosis or low-flow true stenosis.  3. recent fibula fracture Continue conservative management with a brace Pain management with Vicodin  Consults: Cardiology, appreciate input   DVT Prophylaxis: subcutaneous Heparin Nutrition: Cardiac   Code Status:Full Disposition: Admitted to inpatient in telemetry unit.  Author: Berle Mull, MD [facility] Pager: [phone] 05/17/2013, 11:56 PM    If 7PM-7AM, please contact night-coverage [email or web address] Password [email or web address]

## 2013-05-17 NOTE — ED Notes (Signed)
Pt was sent to the ED because he had left sided chest pain with nausea and SOB. Currently pain is at 0. No respiratory distress. Family at bedside. Meal given. Dr. Darl Householder at bedside. Noted pt has a right broken foot in boot. Will continue to monitor.

## 2013-05-17 NOTE — ED Notes (Addendum)
Pt c/o left sided chest pain onset yesterday with shortness of breath and nausea. Pt seen at an urgent care today and had an xray that showed fluid around his heart. Pt sent here for further eval. Pt took 1 regular ASA today.

## 2013-05-17 NOTE — Telephone Encounter (Signed)
Jonathan Acevedo is a 80 y.o. male with a history of CAD, prior MI, ischemic cardiomyopathy, aortic stenosis. Recent ejection fraction 40-45%. Patient became dizzy and passed out about a week ago and fractured his right leg. He has been seen at the [facility] twice. Most recently, today. He notes orthopnea, PND, LE edema and increased dyspnea with minimal activity over the past week. He was given furosemide 20 mg daily to start today at the [facility]. Patient denies chest pain. He denies fever or cough. Given his recent history I have recommended that he be seen in the emergency room today for further evaluation. He likely needs admission for heart failure with IV Lasix and further evaluation for unexplained syncope. He agrees to come to [facility] emergency room today. Richardson Dopp, PA-C   05/17/2013 4:58 PM

## 2013-05-18 ENCOUNTER — Encounter (HOSPITAL_COMMUNITY): Payer: Self-pay | Admitting: Nurse Practitioner

## 2013-05-18 DIAGNOSIS — I509 Heart failure, unspecified: Secondary | ICD-10-CM

## 2013-05-18 DIAGNOSIS — I2589 Other forms of chronic ischemic heart disease: Secondary | ICD-10-CM

## 2013-05-18 DIAGNOSIS — I359 Nonrheumatic aortic valve disorder, unspecified: Secondary | ICD-10-CM

## 2013-05-18 DIAGNOSIS — N289 Disorder of kidney and ureter, unspecified: Secondary | ICD-10-CM

## 2013-05-18 DIAGNOSIS — R55 Syncope and collapse: Secondary | ICD-10-CM

## 2013-05-18 DIAGNOSIS — I5043 Acute on chronic combined systolic (congestive) and diastolic (congestive) heart failure: Principal | ICD-10-CM

## 2013-05-18 HISTORY — DX: Syncope and collapse: R55

## 2013-05-18 LAB — CBC WITH DIFFERENTIAL/PLATELET
Basophils Absolute: 0 10*3/uL (ref 0.0–0.1)
Basophils Relative: 0 % (ref 0–1)
Eosinophils Absolute: 0.2 10*3/uL (ref 0.0–0.7)
Eosinophils Relative: 3 % (ref 0–5)
HCT: 36.2 % — ABNORMAL LOW (ref 39.0–52.0)
Hemoglobin: 12.3 g/dL — ABNORMAL LOW (ref 13.0–17.0)
LYMPHS PCT: 23 % (ref 12–46)
Lymphs Abs: 1.8 10*3/uL (ref 0.7–4.0)
MCH: 31.1 pg (ref 26.0–34.0)
MCHC: 34 g/dL (ref 30.0–36.0)
MCV: 91.4 fL (ref 78.0–100.0)
Monocytes Absolute: 1.1 10*3/uL — ABNORMAL HIGH (ref 0.1–1.0)
Monocytes Relative: 15 % — ABNORMAL HIGH (ref 3–12)
NEUTROS ABS: 4.7 10*3/uL (ref 1.7–7.7)
NEUTROS PCT: 59 % (ref 43–77)
PLATELETS: 161 10*3/uL (ref 150–400)
RBC: 3.96 MIL/uL — ABNORMAL LOW (ref 4.22–5.81)
RDW: 14.3 % (ref 11.5–15.5)
WBC: 7.8 10*3/uL (ref 4.0–10.5)

## 2013-05-18 LAB — COMPREHENSIVE METABOLIC PANEL
ALT: 13 U/L (ref 0–53)
AST: 18 U/L (ref 0–37)
Albumin: 3.8 g/dL (ref 3.5–5.2)
Alkaline Phosphatase: 82 U/L (ref 39–117)
BUN: 29 mg/dL — ABNORMAL HIGH (ref 6–23)
CHLORIDE: 101 meq/L (ref 96–112)
CO2: 25 meq/L (ref 19–32)
Calcium: 9.1 mg/dL (ref 8.4–10.5)
Creatinine, Ser: 1.53 mg/dL — ABNORMAL HIGH (ref 0.50–1.35)
GFR calc Af Amer: 47 mL/min — ABNORMAL LOW (ref >90)
GFR, EST NON AFRICAN AMERICAN: 40 mL/min — AB (ref >90)
Glucose, Bld: 94 mg/dL (ref 70–99)
POTASSIUM: 3.9 meq/L (ref 3.7–5.3)
SODIUM: 143 meq/L (ref 137–147)
Total Bilirubin: 0.5 mg/dL (ref 0.3–1.2)
Total Protein: 7.2 g/dL (ref 6.0–8.3)

## 2013-05-18 LAB — TROPONIN I
Troponin I: <0.3 ng/mL (ref <0.30)
Troponin I: <0.3 ng/mL (ref <0.30)
Troponin I: <0.3 ng/mL (ref <0.30)

## 2013-05-18 MED ORDER — ACETAMINOPHEN 650 MG RE SUPP: 650.0000 mg | Freq: Four times a day (QID) | RECTAL | Status: DC | PRN

## 2013-05-18 MED ORDER — EZETIMIBE-SIMVASTATIN 10-20 MG PO TABS
1.0000 | ORAL_TABLET | Freq: Every day | ORAL | Status: DC
  Administered 2013-05-18 – 2013-05-20 (×3): 1 via ORAL
  Filled 2013-05-18 (×3): qty 1

## 2013-05-18 MED ORDER — ENALAPRIL MALEATE 2.5 MG PO TABS
1.2500 mg | ORAL_TABLET | Freq: Every day | ORAL | Status: DC
  Administered 2013-05-18 – 2013-05-19 (×2): 1.25 mg via ORAL
  Filled 2013-05-18 (×3): qty 0.5

## 2013-05-18 MED ORDER — ONDANSETRON HCL 4 MG PO TABS: 4.0000 mg | ORAL_TABLET | Freq: Four times a day (QID) | ORAL | Status: DC | PRN

## 2013-05-18 MED ORDER — HEPARIN SODIUM (PORCINE) 5000 UNIT/ML IJ SOLN
5000.0000 [IU] | Freq: Three times a day (TID) | INTRAMUSCULAR | Status: DC
  Administered 2013-05-18 – 2013-05-19 (×4): 5000 [IU] via SUBCUTANEOUS
  Filled 2013-05-18 (×6): qty 1

## 2013-05-18 MED ORDER — FUROSEMIDE 20 MG PO TABS
20.0000 mg | ORAL_TABLET | Freq: Every day | ORAL | Status: DC
  Filled 2013-05-18: qty 1

## 2013-05-18 MED ORDER — ASPIRIN EC 81 MG PO TBEC
81.0000 mg | DELAYED_RELEASE_TABLET | Freq: Every morning | ORAL | Status: DC
  Administered 2013-05-19 – 2013-05-20 (×2): 81 mg via ORAL
  Filled 2013-05-18 (×2): qty 1

## 2013-05-18 MED ORDER — ASPIRIN EC 325 MG PO TBEC
325.0000 mg | DELAYED_RELEASE_TABLET | Freq: Every morning | ORAL | Status: DC
  Administered 2013-05-18: 325 mg via ORAL
  Filled 2013-05-18: qty 1

## 2013-05-18 MED ORDER — SODIUM CHLORIDE 0.9 % IJ SOLN
3.0000 mL | Freq: Two times a day (BID) | INTRAMUSCULAR | Status: DC
  Administered 2013-05-18 – 2013-05-20 (×6): 3 mL via INTRAVENOUS

## 2013-05-18 MED ORDER — FUROSEMIDE 10 MG/ML IJ SOLN
40.0000 mg | Freq: Once | INTRAMUSCULAR | Status: AC
  Administered 2013-05-18: 40 mg via INTRAVENOUS
  Filled 2013-05-18: qty 4

## 2013-05-18 MED ORDER — ACETAMINOPHEN 325 MG PO TABS
650.0000 mg | ORAL_TABLET | Freq: Four times a day (QID) | ORAL | Status: DC | PRN
  Administered 2013-05-19 – 2013-05-20 (×3): 650 mg via ORAL
  Filled 2013-05-18 (×3): qty 2

## 2013-05-18 MED ORDER — ONDANSETRON HCL 4 MG/2ML IJ SOLN
4.0000 mg | Freq: Four times a day (QID) | INTRAMUSCULAR | Status: DC | PRN
  Filled 2013-05-18: qty 2

## 2013-05-18 MED ORDER — HYDROCODONE-ACETAMINOPHEN 5-325 MG PO TABS
1.0000 | ORAL_TABLET | Freq: Four times a day (QID) | ORAL | Status: DC | PRN
  Administered 2013-05-18: 1 via ORAL
  Filled 2013-05-18 (×2): qty 1

## 2013-05-18 NOTE — Consult Note (Signed)
Patient Name: Jonathan Acevedo      SUBJECTIVE: 80 year old with at least moderate aortic stenosis by echo 10/14 with a mean gradient of 26 and an ejection fraction of 45% presents with recent acute worsening of dyspnea noted both with exercise as well as orthopnea and nocturnal dyspnea. He was started on a diuretic a couple of days ago with interval improvement. However, because of his symptoms yesterday he came to hospital.  He has noted shortness of breath over a somewhat longer period of time with exertion as well.  He recently also had a syncopal episode.  He has an antecedent history of orthostatic lightheadedness which has been relieved over a couple of moments with resting. The syncopal episode his mind was quite distinct. He walked down to the mailbox. He became aware of lightheadedness but without palpitations. It had a different field from prior episodes of lightheadedness. He then fell and fractured his ankle.  On evaluation in the emergency room he is noted to have PVCs and this was apparently the explanation given to the patient Which was attributed to hypotension apparently because of some of his medications were reduced. I infer this from the notes on the telephone contacts but I don't see the data  Earlier this month he was also noted to have PVCs     Past Medical History  Diagnosis Date  . Myocardial infarction 1998  . Coronary artery disease     w remote anterior myocardial infarction  . Hypertension   . Renal insufficiency   . Aortic stenosis   . Rectal polyp   . Hypercholesteremia   . Kidney stones   . 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 Hypercalcemia 12/26/2011  . Afib   . Syncope 05/18/2013    Social history he is married and does not smoke  Review of systems is negative apart from was listed above  Family history is negative for bowel disease   Scheduled Meds:  Scheduled Meds: . aspirin  325 mg Oral q morning - 10a  . enalapril  1.25 mg  Oral Daily  . ezetimibe-simvastatin  1 tablet Oral Daily  . heparin  5,000 Units Subcutaneous 3 times per day  . sodium chloride  3 mL Intravenous Q12H   Continuous Infusions:   PHYSICAL EXAM Filed Vitals:   05/18/13 0015 05/18/13 0104 05/18/13 0512 05/18/13 1005  BP: 128/72 135/83 117/71 126/60  Pulse: 63 69 69   Temp:  97.9 F (36.6 C) 97.5 F (36.4 C)   TempSrc:  Oral Oral   Resp: 22 20 18    Height:  5\' 10"  (1.778 m)    Weight:  206 lb 9.6 oz (93.713 kg)    SpO2: 96% 95% 98%    Alert and oriented in no acute distress HENT- normal Eyes- EOMI, without scleral icterus Skin- warm and dry; without rashes LN-neg Neck- supple without thyromegaly, JVP-flat, carotids somewhat Back-without CVAT or kyphosis Lungs-clear to auscultation CV irregular rhythm with pauses consistent with ventricular ectopy; there is a 3/6 harsh systolic murmur with the obliteration of S2  Abd-soft with active bowel sounds; no midline pulsation or hepatomegaly Pulses-intact femoral and distal MKS-without gross deformity; his foot is in a walking castNeuro- Ax O, CN3-12 intact, grossly normal motor and sensory function Affect engaging  TELEMETRY: Reviewed telemetry pt in *sinus rhythm with frequent PVCs    Intake/Output Summary (Last 24 hours) at 05/18/13 1526 Last data filed at 05/18/13 1007  Gross per 24 hour  Intake  726 ml  Output   1650 ml  Net   -924 ml    LABS: Basic Metabolic Panel:  Recent Labs Lab 05/15/13 1239 05/17/13 1930 05/18/13 0800  NA 140 140 143  K 4.3 4.0 3.9  CL 106 100 101  CO2 26 25 25   GLUCOSE 97 105* 94  BUN 27* 29* 29*  CREATININE 1.38* 1.38* 1.53*  CALCIUM 9.1 9.4 9.1  MG 1.9  --   --    Cardiac Enzymes:  Recent Labs  05/18/13 0158 05/18/13 0800 05/18/13 1230  TROPONINI <0.30 <0.30 <0.30   CBC:  Recent Labs Lab 05/15/13 1240 05/17/13 1930 05/18/13 0800  WBC 7.0 7.7 7.8  NEUTROABS  --  5.2 4.7  HGB 11.0* 11.9* 12.3*  HCT 35.4* 35.3* 36.2*   MCV 95.5 92.4 91.4  PLT  --  152 161   PROTIME: No results found for this basename: LABPROT, INR,  in the last 72 hours Liver Function Tests:  Recent Labs  05/18/13 0800  AST 18  ALT 13  ALKPHOS 82  BILITOT 0.5  PROT 7.2  ALBUMIN 3.8   No results found for this basename: LIPASE, AMYLASE,  in the last 72 hours BNP: BNP (last 3 results)  Recent Labs  05/17/13 1930  PROBNP 6425.0*   And   Principal Problem:   Acute on chronic combined systolic and diastolic CHF (congestive heart failure) Active Problems:   Hypertension   Renal insufficiency   Aortic stenosis   Ischemic cardiomyopathy   Syncope  The patient presents with congestive heart failure in the setting of ischemic heart disease aortic stenosis and newly identified and frequent PVCs. His echo was reviewed today with Dr. Lesly Dukes; ejection fraction and aortic valve gradient appears stable since the fall with visible opening of the aortic valve. There is chamber dilatation consistent with volume overload.  Of more impressive to me at concern, in light of the echo results, is the cause of his syncope. With his ischemic cardiomyopathy, ventricular arrhythmias should be excluded and knowing that the aortic valve is not critical allow electrophysiological testing. However, catheterization is indicated prior to that especially given his exercise intolerance.  I have spent a long time reviewing with the patient the aforementioned plan.  Hence we will #1. Assuming his renal function is stable, proceed with catheterization 2. EP testing to follow the above 3. Would consider the use of a loop recorder in his intermediate risk patient in the event that VT is not identified 4. Driving restricted      Signed, Deboraha Sprang MD  05/18/2013

## 2013-05-18 NOTE — Progress Notes (Signed)
  Echocardiogram 2D Echocardiogram has been performed.  Basilia Jumbo 05/18/2013, 12:15 PM

## 2013-05-18 NOTE — Progress Notes (Signed)
[facility] PROGRESS NOTE Interim History: 80 yr old male with hx of CAD with remote MI and prior stent to the LAD, HTN, CKD, moderate AS and HLD who presents with LE edema and SOB  Pt states that for the past 2 weeks he has been experiencing worsening orthopnea to the point that he thought he could not wait for his cardiology clinic appointment on Teusday and came into the ER. States that he has been otherwise in good health , swims and runs 1 mile until about a month ago. He had an episode while walking back from picking his mail when he became dizzy / lightheaded and had syncope and broke his right leg. Per pt he went to OSH ER and was told that he had PVC's on EKG and d/c . Thereafter at home he began to feel the orthopnea, PND , LE edema and SOB with exertion . He was prescribed lasix by his PCP that helped his edema . Additionally his vasoetc dose was decreased and his flomax was d/c to help with his dizziness.   Filed Weights   05/17/13 1808 05/18/13 0104  Weight: 95.255 kg (210 lb) 93.713 kg (206 lb 9.6 oz)        Intake/Output Summary (Last 24 hours) at 05/18/13 1542 Last data filed at 05/18/13 1007  Gross per 24 hour  Intake    726 ml  Output   1650 ml  Net   -924 ml     Assessment/Plan: 1-Acute on chronic combined systolic and diastolic CHF (congestive heart failure): improved with IV lasix. Now patient denying SOB or orthopnea. -troponin neg X 3 -2-D echo with EF 40-45% and septal/inferior wall akinesis -will give 1 more dose IV lasix and hold diuretics -cardiology consulted and with his presentation concerned for arrhythmias and angina -plan is for catheterization and EP testing after cath -continue low sodium diet -strict I's and O's and daily weight -given decrease EF and follow ing cardiology rec's will add very low dose enalapril (1.25mg )  2-Syncope: continue on telemetry and follow results of EP studies.  3-Ischemic cardiomyopathy: patient deneis CP and  troponin negative. -given hx, risk factors and presentation with decrease exercise tolerance plan is for cardiac cath  4-Hypertension: stable. Will continue current regimen  5-Aortic stenosis: per 2-D echo unchanged. Cardiology on board. Will follow rec's  6-Renal insufficiency: stable to slightly worse with diuresis most likely -will hold lasix after today -follow BMET closely.  YBO:[id number]     Code Status: Full Family Communication: no family at bedside Disposition Plan: remains inpatient meanwhile   Consultants:  Cardiology   Procedures: ECHO:  - Left ventricle: The cavity size was mildly dilated. There was moderate concentric hypertrophy. Systolic function was mildly to moderately reduced. The estimated ejection fraction was in the range of 40% to 45%. There is akinesis of the mid anteroseptal, distal septal and inferior wall and true apex. Doppler parameters are consistent with abnormal left ventricular relaxation (grade 1 diastolic dysfunction). Doppler parameters are consistent with elevated ventricular end-diastolic filling pressure. - Aortic valve: Cusp separation was mildly reduced. There was moderate stenosis. Valve area: 1.25cm^2(VTI). Valve area: 1.1cm^2 (Vmax). - Mitral valve: Calcified annulus. Mildly thickened leaflets . Transvalvular velocity was within the normal range. There was no evidence for stenosis. No regurgitation. - Left atrium: The atrium was severely dilated. - Right ventricle: The cavity size was mildly dilated. Wall thickness was normal. Systolic function was normal. - Right atrium: The atrium was moderately dilated. - Tricuspid  valve: Mild regurgitation. - Pulmonary arteries: Systolic pressure was within the normal range. - Inferior vena cava: The vessel was dilated; the respirophasic diameter changes were blunted (< 50%); findings are consistent with elevated central venous pressure. - Impressions: When compared to the prior study  from 2014 left ventricle is now midly dilated with elevated filling pressures. LVEF is the same estimated at 40-45%. Aortic stenosis is unchanged estimated as moderate with mean gradient 24 mmHg.   Antibiotics:  None   HPI/Subjective: Feeling better and breathing better. Reports he was able to laying flat all night.  Objective: Filed Vitals:   05/18/13 0015 05/18/13 0104 05/18/13 0512 05/18/13 1005  BP: 128/72 135/83 117/71 126/60  Pulse: 63 69 69   Temp:  97.9 F (36.6 C) 97.5 F (36.4 C)   TempSrc:  Oral Oral   Resp: 22 20 18    Height:  5\' 10"  (1.778 m)    Weight:  93.713 kg (206 lb 9.6 oz)    SpO2: 96% 95% 98%      Exam:  General: Alert, awake, oriented x3, in no acute distress.  HEENT: No bruits, no goiter. No JVD Heart: irregular, with positive SEM murmurs, no rubs or gallops.  Lungs: Good air movement, no frank crackles Abdomen: Soft, nontender, nondistended, positive bowel sounds.  Neuro: Grossly intact, nonfocal.   Data Reviewed: Basic Metabolic Panel:  Recent Labs Lab 05/15/13 1239 05/17/13 1930 05/18/13 0800  NA 140 140 143  K 4.3 4.0 3.9  CL 106 100 101  CO2 26 25 25   GLUCOSE 97 105* 94  BUN 27* 29* 29*  CREATININE 1.38* 1.38* 1.53*  CALCIUM 9.1 9.4 9.1  MG 1.9  --   --    Liver Function Tests:  Recent Labs Lab 05/15/13 1239 05/18/13 0800  AST 16 18  ALT 13 13  ALKPHOS 70 82  BILITOT 0.7 0.5  PROT 6.3 7.2  ALBUMIN 3.8 3.8   CBC:  Recent Labs Lab 05/15/13 1240 05/17/13 1930 05/18/13 0800  WBC 7.0 7.7 7.8  NEUTROABS  --  5.2 4.7  HGB 11.0* 11.9* 12.3*  HCT 35.4* 35.3* 36.2*  MCV 95.5 92.4 91.4  PLT  --  152 161   Cardiac Enzymes:  Recent Labs Lab 05/18/13 0158 05/18/13 0800 05/18/13 1230  TROPONINI <0.30 <0.30 <0.30   BNP (last 3 results)  Recent Labs  05/17/13 1930  PROBNP 6425.0*   Studies: Dg Chest 2 View  05/17/2013   CLINICAL DATA:  shortness of breath  EXAM: CHEST  2 VIEW  COMPARISON:  DG CHEST 2V  dated 05/02/2013  FINDINGS: Low lung volumes. Cardiac silhouette moderately enlarged minimal atelectasis versus scarring left lung base. No infiltrates nor regions of consolidation. Osseous structures unremarkable.  IMPRESSION: Stable minimal scarring left lung base. No acute cardiopulmonary disease   Electronically Signed   By: Margaree Mackintosh M.D.   On: 05/17/2013 18:04   Ct Angio Chest Pe W/cm &/or Wo Cm  05/17/2013   CLINICAL DATA:  Shortness of breath, evaluate for pulmonary embolism.  EXAM: CT ANGIOGRAPHY CHEST WITH CONTRAST  TECHNIQUE: Multidetector CT imaging of the chest was performed using the standard protocol during bolus administration of intravenous contrast. Multiplanar CT image reconstructions and MIPs were obtained to evaluate the vascular anatomy.  CONTRAST:  115mL OMNIPAQUE IOHEXOL 350 MG/ML SOLN  COMPARISON:  DG CHEST 2V dated 05/17/2013  FINDINGS: Adequate contrast opacification of the pulmonary artery's. Main pulmonary artery is not enlarged. No pulmonary arterial filling defects to the  level of the subsegmental branches.  The heart is mild-to-moderately enlarged. Three vessel coronary artery calcifications. Pericardium is unremarkable. No right heart strain. Thoracic aorta is normal course and caliber, with moderate to severe calcific atherosclerosis. Low-density sub carinal lymph node measures up to 14 mm short axis with small right hilar low-density lymph nodes, to lesser extent on the left. Trace paraseptal and bullous emphysema at the lung apices though, evaluation is limited by respiratory motion. No focal consolidations or pleural effusions. Mild bronchial wall thickening, tracheobronchial tree is patent. No pneumothorax.  Small air-fluid level in the included view of the esophagus. Low-density partially imaged cysts in the liver measuring up to 2.9 cm (7 Hounsfield units) ; multiple cysts partially imaged in the kidneys. Visualized soft tissues and included osseous structures are  nonsuspicious; severe degenerative change of the included cervical spine.  Review of the MIP images confirms the above findings.  IMPRESSION: No acute pulmonary embolism.  Mild to moderate cardiomegaly ; mild bronchial wall thickening could reflect bronchitis without pneumonia.  Prominent mediastinal lymph nodes could be reactive though, are nonspecific.   Electronically Signed   By: Elon Alas   On: 05/17/2013 22:54    Scheduled Meds: . Derrill Memo ON 05/19/2013] aspirin  81 mg Oral q morning - 10a  . enalapril  1.25 mg Oral Daily  . ezetimibe-simvastatin  1 tablet Oral Daily  . heparin  5,000 Units Subcutaneous 3 times per day  . sodium chloride  3 mL Intravenous Q12H   Continuous Infusions:   Time > 30 minutes  Barton Dubois  [facility] Pager [phone]. If 8PM-8AM, please contact night-coverage at [email or web address], password [facility] 05/18/2013, 3:42 PM  LOS: 1 day

## 2013-05-19 ENCOUNTER — Encounter (HOSPITAL_COMMUNITY)
Admission: EM | Disposition: A | Discharge status: Home / Self Care | Payer: Self-pay | Source: Home / Self Care | Attending: Internal Medicine

## 2013-05-19 ENCOUNTER — Encounter: Payer: Self-pay | Admitting: Radiology

## 2013-05-19 DIAGNOSIS — R55 Syncope and collapse: Secondary | ICD-10-CM

## 2013-05-19 DIAGNOSIS — I10 Essential (primary) hypertension: Secondary | ICD-10-CM

## 2013-05-19 DIAGNOSIS — I509 Heart failure, unspecified: Secondary | ICD-10-CM

## 2013-05-19 HISTORY — PX: LEFT HEART CATHETERIZATION WITH CORONARY ANGIOGRAM: SHX5451

## 2013-05-19 HISTORY — PX: ELECTROPHYSIOLOGY STUDY: SHX5467

## 2013-05-19 HISTORY — PX: ELECTROPHYSIOLOGY STUDY: SHX5802

## 2013-05-19 HISTORY — PX: LOOP RECORDER IMPLANT: SHX5954

## 2013-05-19 LAB — BASIC METABOLIC PANEL
BUN: 27 mg/dL — ABNORMAL HIGH (ref 6–23)
CALCIUM: 9 mg/dL (ref 8.4–10.5)
CO2: 23 mEq/L (ref 19–32)
Chloride: 100 mEq/L (ref 96–112)
Creatinine, Ser: 1.4 mg/dL — ABNORMAL HIGH (ref 0.50–1.35)
GFR, EST AFRICAN AMERICAN: 52 mL/min — AB (ref >90)
GFR, EST NON AFRICAN AMERICAN: 45 mL/min — AB (ref >90)
GLUCOSE: 96 mg/dL (ref 70–99)
POTASSIUM: 4.3 meq/L (ref 3.7–5.3)
Sodium: 142 mEq/L (ref 137–147)

## 2013-05-19 LAB — GLUCOSE, CAPILLARY: Glucose-Capillary: 95 mg/dL (ref 70–99)

## 2013-05-19 LAB — CREATININE, SERUM
Creatinine, Ser: 1.25 mg/dL (ref 0.50–1.35)
GFR calc non Af Amer: 51 mL/min — ABNORMAL LOW (ref >90)
GFR, EST AFRICAN AMERICAN: 60 mL/min — AB (ref >90)

## 2013-05-19 LAB — CBC
HEMATOCRIT: 37.1 % — AB (ref 39.0–52.0)
HEMOGLOBIN: 12.3 g/dL — AB (ref 13.0–17.0)
MCH: 30.8 pg (ref 26.0–34.0)
MCHC: 33.2 g/dL (ref 30.0–36.0)
MCV: 93 fL (ref 78.0–100.0)
PLATELETS: 166 10*3/uL (ref 150–400)
RBC: 3.99 MIL/uL — AB (ref 4.22–5.81)
RDW: 14.2 % (ref 11.5–15.5)
WBC: 6.7 10*3/uL (ref 4.0–10.5)

## 2013-05-19 LAB — PROTIME-INR
INR: 1.21 (ref 0.00–1.49)
Prothrombin Time: 15 seconds (ref 11.6–15.2)

## 2013-05-19 SURGERY — ELECTROPHYSIOLOGY STUDY
Anesthesia: LOCAL

## 2013-05-19 SURGERY — LEFT HEART CATHETERIZATION WITH CORONARY ANGIOGRAM
Anesthesia: LOCAL

## 2013-05-19 MED ORDER — SODIUM CHLORIDE 0.9 % IV SOLN: 250.0000 mL | INTRAVENOUS | Status: DC | PRN

## 2013-05-19 MED ORDER — BUPIVACAINE HCL (PF) 0.25 % IJ SOLN
INTRAMUSCULAR | Status: AC
  Filled 2013-05-19: qty 60

## 2013-05-19 MED ORDER — SODIUM CHLORIDE 0.9 % IJ SOLN: 3.0000 mL | INTRAMUSCULAR | Status: DC | PRN

## 2013-05-19 MED ORDER — SODIUM CHLORIDE 0.9 % IJ SOLN: 3.0000 mL | Freq: Two times a day (BID) | INTRAMUSCULAR | Status: DC

## 2013-05-19 MED ORDER — SODIUM CHLORIDE 0.9 % IV SOLN: INTRAVENOUS | Status: DC

## 2013-05-19 MED ORDER — FUROSEMIDE 40 MG PO TABS
40.0000 mg | ORAL_TABLET | Freq: Every day | ORAL | Status: DC
  Administered 2013-05-20: 40 mg via ORAL
  Filled 2013-05-19: qty 1

## 2013-05-19 MED ORDER — FENTANYL CITRATE 0.05 MG/ML IJ SOLN
INTRAMUSCULAR | Status: AC
  Filled 2013-05-19: qty 2

## 2013-05-19 MED ORDER — CEFAZOLIN SODIUM-DEXTROSE 2-3 GM-% IV SOLR
2.0000 g | INTRAVENOUS | Status: DC
  Filled 2013-05-19: qty 50

## 2013-05-19 MED ORDER — MIDAZOLAM HCL 2 MG/2ML IJ SOLN
INTRAMUSCULAR | Status: AC
  Filled 2013-05-19: qty 2

## 2013-05-19 MED ORDER — FUROSEMIDE 20 MG PO TABS
20.0000 mg | ORAL_TABLET | Freq: Every day | ORAL | Status: DC
  Administered 2013-05-19: 20 mg via ORAL
  Filled 2013-05-19: qty 1

## 2013-05-19 MED ORDER — ONDANSETRON HCL 4 MG/2ML IJ SOLN
4.0000 mg | Freq: Four times a day (QID) | INTRAMUSCULAR | Status: DC | PRN
  Administered 2013-05-20: 4 mg via INTRAVENOUS

## 2013-05-19 MED ORDER — CHLORHEXIDINE GLUCONATE 4 % EX LIQD
60.0000 mL | Freq: Once | CUTANEOUS | Status: DC
  Filled 2013-05-19: qty 60

## 2013-05-19 MED ORDER — MIDAZOLAM HCL 5 MG/5ML IJ SOLN
INTRAMUSCULAR | Status: AC
  Filled 2013-05-19: qty 5

## 2013-05-19 MED ORDER — CHLORHEXIDINE GLUCONATE CLOTH 2 % EX PADS: 6.0000 | MEDICATED_PAD | Freq: Once | CUTANEOUS | Status: DC

## 2013-05-19 MED ORDER — LIDOCAINE-EPINEPHRINE 1 %-1:100000 IJ SOLN
INTRAMUSCULAR | Status: AC
  Filled 2013-05-19: qty 1

## 2013-05-19 MED ORDER — HEPARIN SODIUM (PORCINE) 5000 UNIT/ML IJ SOLN
5000.0000 [IU] | Freq: Three times a day (TID) | INTRAMUSCULAR | Status: DC
  Administered 2013-05-20: 5000 [IU] via SUBCUTANEOUS
  Filled 2013-05-19 (×3): qty 1

## 2013-05-19 MED ORDER — NITROGLYCERIN 0.2 MG/ML ON CALL CATH LAB
INTRAVENOUS | Status: AC
  Filled 2013-05-19: qty 1

## 2013-05-19 MED ORDER — LIDOCAINE HCL (PF) 1 % IJ SOLN
INTRAMUSCULAR | Status: AC
  Filled 2013-05-19: qty 30

## 2013-05-19 MED ORDER — SODIUM CHLORIDE 0.9 % IV SOLN
1.0000 mL/kg/h | INTRAVENOUS | Status: AC
  Administered 2013-05-19: 1 mL/kg/h via INTRAVENOUS

## 2013-05-19 MED ORDER — SODIUM CHLORIDE 0.9 % IR SOLN
80.0000 mg | Status: DC
  Filled 2013-05-19: qty 2

## 2013-05-19 MED ORDER — ASPIRIN 81 MG PO CHEW: 81.0000 mg | CHEWABLE_TABLET | ORAL | Status: DC

## 2013-05-19 MED ORDER — HEPARIN (PORCINE) IN NACL 2-0.9 UNIT/ML-% IJ SOLN
INTRAMUSCULAR | Status: AC
  Filled 2013-05-19: qty 1000

## 2013-05-19 MED ORDER — HEPARIN SODIUM (PORCINE) 5000 UNIT/ML IJ SOLN: 5000.0000 [IU] | Freq: Three times a day (TID) | INTRAMUSCULAR | Status: DC

## 2013-05-19 MED ORDER — ACETAMINOPHEN 325 MG PO TABS: 325.0000 mg | ORAL_TABLET | ORAL | Status: DC | PRN

## 2013-05-19 NOTE — Progress Notes (Signed)
Chart review complete.  Patient is not eligible for [facility] Care Management services because his/her PCP is not a [facility] primary care provider or is not [facility] affiliated.  For any additional questions or new referrals please contact Tim Henderson BSN RN [facility] Liaison at [phone] °

## 2013-05-19 NOTE — H&P (View-Only) (Signed)
Patient Name: Jonathan Acevedo      SUBJECTIVE: 80 year old with at least moderate aortic stenosis by echo 10/14 with a mean gradient of 26 and an ejection fraction of 45% presents with recent acute worsening of dyspnea noted both with exercise as well as orthopnea and nocturnal dyspnea. He was started on a diuretic a couple of days ago with interval improvement. However, because of his symptoms yesterday he came to hospital.  He has noted shortness of breath over a somewhat longer period of time with exertion as well.  He recently also had a syncopal episode.  He has an antecedent history of orthostatic lightheadedness which has been relieved over a couple of moments with resting. The syncopal episode his mind was quite distinct. He walked down to the mailbox. He became aware of lightheadedness but without palpitations. It had a different field from prior episodes of lightheadedness. He then fell and fractured his ankle.  On evaluation in the emergency room he is noted to have PVCs and this was apparently the explanation given to the patient Which was attributed to hypotension apparently because of some of his medications were reduced. I infer this from the notes on the telephone contacts but I don't see the data  Earlier this month he was also noted to have PVCs     Past Medical History  Diagnosis Date  . Myocardial infarction 1998  . Coronary artery disease     w remote anterior myocardial infarction  . Hypertension   . Renal insufficiency   . Aortic stenosis   . Rectal polyp   . Hypercholesteremia   . Kidney stones   . 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 Hypercalcemia 12/26/2011  . Afib   . Syncope 05/18/2013    Social history he is married and does not smoke  Review of systems is negative apart from was listed above  Family history is negative for bowel disease   Scheduled Meds:  Scheduled Meds: . aspirin  325 mg Oral q morning - 10a  . enalapril  1.25 mg  Oral Daily  . ezetimibe-simvastatin  1 tablet Oral Daily  . heparin  5,000 Units Subcutaneous 3 times per day  . sodium chloride  3 mL Intravenous Q12H   Continuous Infusions:   PHYSICAL EXAM Filed Vitals:   05/18/13 0015 05/18/13 0104 05/18/13 0512 05/18/13 1005  BP: 128/72 135/83 117/71 126/60  Pulse: 63 69 69   Temp:  97.9 F (36.6 C) 97.5 F (36.4 C)   TempSrc:  Oral Oral   Resp: 22 20 18    Height:  5\' 10"  (1.778 m)    Weight:  206 lb 9.6 oz (93.713 kg)    SpO2: 96% 95% 98%    Alert and oriented in no acute distress HENT- normal Eyes- EOMI, without scleral icterus Skin- warm and dry; without rashes LN-neg Neck- supple without thyromegaly, JVP-flat, carotids somewhat Back-without CVAT or kyphosis Lungs-clear to auscultation CV irregular rhythm with pauses consistent with ventricular ectopy; there is a 3/6 harsh systolic murmur with the obliteration of S2  Abd-soft with active bowel sounds; no midline pulsation or hepatomegaly Pulses-intact femoral and distal MKS-without gross deformity; his foot is in a walking castNeuro- Ax O, CN3-12 intact, grossly normal motor and sensory function Affect engaging  TELEMETRY: Reviewed telemetry pt in *sinus rhythm with frequent PVCs    Intake/Output Summary (Last 24 hours) at 05/18/13 1526 Last data filed at 05/18/13 1007  Gross per 24 hour  Intake  726 ml  Output   1650 ml  Net   -924 ml    LABS: Basic Metabolic Panel:  Recent Labs Lab 05/15/13 1239 05/17/13 1930 05/18/13 0800  NA 140 140 143  K 4.3 4.0 3.9  CL 106 100 101  CO2 26 25 25   GLUCOSE 97 105* 94  BUN 27* 29* 29*  CREATININE 1.38* 1.38* 1.53*  CALCIUM 9.1 9.4 9.1  MG 1.9  --   --    Cardiac Enzymes:  Recent Labs  05/18/13 0158 05/18/13 0800 05/18/13 1230  TROPONINI <0.30 <0.30 <0.30   CBC:  Recent Labs Lab 05/15/13 1240 05/17/13 1930 05/18/13 0800  WBC 7.0 7.7 7.8  NEUTROABS  --  5.2 4.7  HGB 11.0* 11.9* 12.3*  HCT 35.4* 35.3* 36.2*   MCV 95.5 92.4 91.4  PLT  --  152 161   PROTIME: No results found for this basename: LABPROT, INR,  in the last 72 hours Liver Function Tests:  Recent Labs  05/18/13 0800  AST 18  ALT 13  ALKPHOS 82  BILITOT 0.5  PROT 7.2  ALBUMIN 3.8   No results found for this basename: LIPASE, AMYLASE,  in the last 72 hours BNP: BNP (last 3 results)  Recent Labs  05/17/13 1930  PROBNP 6425.0*   And   Principal Problem:   Acute on chronic combined systolic and diastolic CHF (congestive heart failure) Active Problems:   Hypertension   Renal insufficiency   Aortic stenosis   Ischemic cardiomyopathy   Syncope  The patient presents with congestive heart failure in the setting of ischemic heart disease aortic stenosis and newly identified and frequent PVCs. His echo was reviewed today with Dr. Lesly Dukes; ejection fraction and aortic valve gradient appears stable since the fall with visible opening of the aortic valve. There is chamber dilatation consistent with volume overload.  Of more impressive to me at concern, in light of the echo results, is the cause of his syncope. With his ischemic cardiomyopathy, ventricular arrhythmias should be excluded and knowing that the aortic valve is not critical allow electrophysiological testing. However, catheterization is indicated prior to that especially given his exercise intolerance.  I have spent a long time reviewing with the patient the aforementioned plan.  Hence we will #1. Assuming his renal function is stable, proceed with catheterization 2. EP testing to follow the above 3. Would consider the use of a loop recorder in his intermediate risk patient in the event that VT is not identified 4. Driving restricted      Signed, Deboraha Sprang MD  05/18/2013

## 2013-05-19 NOTE — Interval H&P Note (Signed)
History and Physical Interval Note:  05/19/2013 3:57 PM  Jonathan Acevedo  has presented today for surgery, with the diagnosis of CP  The various methods of treatment have been discussed with the patient and family. After consideration of risks, benefits and other options for treatment, the patient has consented to  Procedure(s): LEFT HEART CATHETERIZATION WITH CORONARY ANGIOGRAM (N/A) as a surgical intervention .  The patient's history has been reviewed, patient examined, no change in status, stable for surgery.  I have reviewed the patient's chart and labs.  Questions were answered to the patient's satisfaction.   Cath Lab Visit (complete for each Cath Lab visit)  Clinical Evaluation Leading to the Procedure:   ACS: no  Non-ACS:    Anginal Classification: CCS III  Anti-ischemic medical therapy: No Therapy  Non-Invasive Test Results: No non-invasive testing performed  Prior CABG: No previous CABG        Ander Slade [facility] 05/19/2013 3:57 PM

## 2013-05-19 NOTE — CV Procedure (Signed)
Invasive EPS followed by insertion of an ILR without immediate complication. [id number].

## 2013-05-19 NOTE — Progress Notes (Signed)
Patient: Jonathan Acevedo Date of Encounter: 05/19/2013, 11:36 AM Admit date: 05/17/2013     Subjective  Mr. Bazen has no new complaints this AM. He denies CP, SOB or palpitations.   Objective  Physical Exam: Vitals: BP 138/82  Pulse 66  Temp(Src) 98.9 F (37.2 C) (Oral)  Resp 18  Ht 5\' 10"  (1.778 m)  Wt 203 lb 4.8 oz (92.216 kg)  BMI 29.17 kg/m2  SpO2 95% General: Well developed 80 year old man in no acute distress. Neck: Supple. JVD not elevated. Lungs: Clear bilaterally to auscultation without wheezes, rales, or rhonchi. Breathing is unlabored. Heart: RRR S1 S2 with III/VI systolic murmur. No rub or gallop.  Abdomen: Soft, non-distended. Extremities: No clubbing or cyanosis. No edema.  Distal pedal pulses are 2+ and equal bilaterally. Neuro: Alert and oriented X 3. Moves all extremities spontaneously. No focal deficits.  Intake/Output:  Intake/Output Summary (Last 24 hours) at 05/19/13 1136 Last data filed at 05/19/13 0848  Gross per 24 hour  Intake    240 ml  Output    900 ml  Net   -660 ml    Inpatient Medications:  . aspirin  81 mg Oral q morning - 10a  . Chlorhexidine Gluconate Cloth  6 each Topical Once  . enalapril  1.25 mg Oral Daily  . ezetimibe-simvastatin  1 tablet Oral Daily  . furosemide  20 mg Oral Daily  . heparin  5,000 Units Subcutaneous 3 times per day  . sodium chloride  3 mL Intravenous Q12H    Labs:  Recent Labs  05/18/13 0800 05/19/13 0601  NA 143 142  K 3.9 4.3  CL 101 100  CO2 25 23  GLUCOSE 94 96  BUN 29* 27*  CREATININE 1.53* 1.40*  CALCIUM 9.1 9.0    Recent Labs  05/18/13 0800  AST 18  ALT 13  ALKPHOS 82  BILITOT 0.5  PROT 7.2  ALBUMIN 3.8    Recent Labs  05/17/13 1930 05/18/13 0800  WBC 7.7 7.8  NEUTROABS 5.2 4.7  HGB 11.9* 12.3*  HCT 35.3* 36.2*  MCV 92.4 91.4  PLT 152 161    Recent Labs  05/18/13 0158 05/18/13 0800 05/18/13 1230 05/18/13 1646  TROPONINI <0.30 <0.30 <0.30 <0.30     Radiology/Studies: Dg Chest 2 View  05/17/2013   CLINICAL DATA:  shortness of breath  EXAM: CHEST  2 VIEW  COMPARISON:  DG CHEST 2V dated 05/02/2013  FINDINGS: Low lung volumes. Cardiac silhouette moderately enlarged minimal atelectasis versus scarring left lung base. No infiltrates nor regions of consolidation. Osseous structures unremarkable.  IMPRESSION: Stable minimal scarring left lung base. No acute cardiopulmonary disease   Electronically Signed   By: Margaree Mackintosh M.D.   On: 05/17/2013 18:04   Ct Angio Chest Pe W/cm &/or Wo Cm  05/17/2013   CLINICAL DATA:  Shortness of breath, evaluate for pulmonary embolism.  EXAM: CT ANGIOGRAPHY CHEST WITH CONTRAST  TECHNIQUE: Multidetector CT imaging of the chest was performed using the standard protocol during bolus administration of intravenous contrast. Multiplanar CT image reconstructions and MIPs were obtained to evaluate the vascular anatomy.  CONTRAST:  126mL OMNIPAQUE IOHEXOL 350 MG/ML SOLN  COMPARISON:  DG CHEST 2V dated 05/17/2013  FINDINGS: Adequate contrast opacification of the pulmonary artery's. Main pulmonary artery is not enlarged. No pulmonary arterial filling defects to the level of the subsegmental branches.  The heart is mild-to-moderately enlarged. Three vessel coronary artery calcifications. Pericardium is unremarkable. No right heart strain.  Thoracic aorta is normal course and caliber, with moderate to severe calcific atherosclerosis. Low-density sub carinal lymph node measures up to 14 mm short axis with small right hilar low-density lymph nodes, to lesser extent on the left. Trace paraseptal and bullous emphysema at the lung apices though, evaluation is limited by respiratory motion. No focal consolidations or pleural effusions. Mild bronchial wall thickening, tracheobronchial tree is patent. No pneumothorax.  Small air-fluid level in the included view of the esophagus. Low-density partially imaged cysts in the liver measuring up to 2.9  cm (7 Hounsfield units) ; multiple cysts partially imaged in the kidneys. Visualized soft tissues and included osseous structures are nonsuspicious; severe degenerative change of the included cervical spine.  Review of the MIP images confirms the above findings.  IMPRESSION: No acute pulmonary embolism.  Mild to moderate cardiomegaly ; mild bronchial wall thickening could reflect bronchitis without pneumonia.  Prominent mediastinal lymph nodes could be reactive though, are nonspecific.   Electronically Signed   By: Elon Alas   On: 05/17/2013 22:54   Echocardiogram: Study Conclusions - Left ventricle: The cavity size was mildly dilated. There was moderate concentric hypertrophy. Systolic function was mildly to moderately reduced. The estimated ejection fraction was in the range of 40% to 45%. There is akinesis of the mid anteroseptal, distal septal and inferior wall and true apex. Doppler parameters are consistent with abnormal left ventricular relaxation (grade 1 diastolic dysfunction). Doppler parameters are consistent with elevated ventricular end-diastolic filling pressure. - Aortic valve: Cusp separation was mildly reduced. There was moderate stenosis. Valve area: 1.25cm^2(VTI). Valve area: 1.1cm^2 (Vmax). - Mitral valve: Calcified annulus. Mildly thickened leaflets . Transvalvular velocity was within the normal range. There was no evidence for stenosis. No regurgitation. - Left atrium: The atrium was severely dilated. - Right ventricle: The cavity size was mildly dilated. Wall thickness was normal. Systolic function was normal. - Right atrium: The atrium was moderately dilated. - Tricuspid valve: Mild regurgitation. - Pulmonary arteries: Systolic pressure was within the normal range. - Inferior vena cava: The vessel was dilated; the respirophasic diameter changes were blunted (< 50%); findings are consistent with elevated central venous pressure. - Impressions: When  compared to the prior study from 2014 left ventricle is now midly dilated with elevated filling pressures. LVEF is the same estimated at 40-45%. Aortic stenosis is unchanged estimated as moderate with mean gradient 24 mmHg. Impressions: - When compared to the prior study from 2014 left ventricle is now midly dilated with elevated filling pressures. LVEF is the same estimated at 40-45%. Aortic stenosis is unchanged estimated as moderate with mean gradient 24 mmHg.  Telemetry: SR with frequent PVCs   Assessment and Plan  1. Acute on chronic combined systolic and diastolic HF 2. Ischemic CM, EF 40-45% 3. Syncope 4. Aortic stenosis, moderate  5. CAD 6. Renal insufficiency 7. HTN For cardiac cath and EP study today. If EP study is positive for inducible VT then will need ICD implantation. If EP study negative for VT, then will need ILR insertion. No driving for 6 months. Reviewed this plan of care with Mr. Lofstrom and his wife. Dr. Lovena Le to see.  Signed, Andrez Grime PA-C  EP Attending  Patient seen and examined. Agree with above history, physical exam, assessment and plan. He has unexplained syncope in the setting of a prior MI, EF 40%, and moderate AS. Will plan EP study, and if negative, ILR. If positive, he will considered for ICD.  Mikle Bosworth.D.

## 2013-05-19 NOTE — Progress Notes (Signed)
[facility] PROGRESS NOTE Interim History: 80 yr old male with hx of CAD with remote MI and prior stent to the LAD, HTN, CKD, moderate AS and HLD who presents with LE edema and SOB  Pt states that for the past 2 weeks he has been experiencing worsening orthopnea to the point that he thought he could not wait for his cardiology clinic appointment on Teusday and came into the ER. States that he has been otherwise in good health , swims and runs 1 mile until about a month ago. He had an episode while walking back from picking his mail when he became dizzy / lightheaded and had syncope and broke his right leg. Per pt he went to OSH ER and was told that he had PVC's on EKG and d/c . Thereafter at home he began to feel the orthopnea, PND , LE edema and SOB with exertion . He was prescribed lasix by his PCP that helped his edema . Additionally his vasoetc dose was decreased and his flomax was d/c to help with his dizziness.   Filed Weights   05/17/13 1808 05/18/13 0104 05/19/13 0552  Weight: 95.255 kg (210 lb) 93.713 kg (206 lb 9.6 oz) 92.216 kg (203 lb 4.8 oz)        Intake/Output Summary (Last 24 hours) at 05/19/13 1502 Last data filed at 05/19/13 1332  Gross per 24 hour  Intake    240 ml  Output    900 ml  Net   -660 ml     Assessment/Plan: 1-Acute on chronic combined systolic and diastolic CHF (congestive heart failure): improved with IV lasix. Now patient denying SOB or orthopnea. -troponin neg X 3 -2-D echo with EF 40-45% and septal/inferior wall akinesis -will continue daily low dose lasix (20mg ) by mouth -cardiology consulted and with his presentation concerned for arrhythmias and angina; plan is for catheterization and EP testing after cath -continue low sodium diet, strict I's and O's and daily weight -given decrease EF and following cardiology rec's will continue very low dose enalapril (1.25mg )  2-Syncope: continue on telemetry and follow results of EP  studies.  3-Ischemic cardiomyopathy: patient deneis CP and troponin negative. -given risk factors, hx of syncope and presentation with decrease exercise tolerance plan is for cardiac cath today 4/13 and electrophysiology studies -if positive VT will need ICD; if negative, plan is for implantable loop recorder -no driving for 6 months -will follow cardiology rec's  4-Hypertension: stable. Will continue current regimen  5-Aortic stenosis: per 2-D echo unchanged. Cardiology on board. Will follow rec's  6-Renal insufficiency: stable and back to baseline -resume oral low dose lasix on daily basis -follow BMET closely (especially with plans for cath today, 4/13).  TOI:[id number]   Code Status: Full Family Communication: no family at bedside Disposition Plan: remains inpatient meanwhile   Consultants:  Cardiology   Procedures: ECHO:  - Left ventricle: The cavity size was mildly dilated. There was moderate concentric hypertrophy. Systolic function was mildly to moderately reduced. The estimated ejection fraction was in the range of 40% to 45%. There is akinesis of the mid anteroseptal, distal septal and inferior wall and true apex. Doppler parameters are consistent with abnormal left ventricular relaxation (grade 1 diastolic dysfunction). Doppler parameters are consistent with elevated ventricular end-diastolic filling pressure. - Aortic valve: Cusp separation was mildly reduced. There was moderate stenosis. Valve area: 1.25cm^2(VTI). Valve area: 1.1cm^2 (Vmax). - Mitral valve: Calcified annulus. Mildly thickened leaflets . Transvalvular velocity was within the normal range. There  was no evidence for stenosis. No regurgitation. - Left atrium: The atrium was severely dilated. - Right ventricle: The cavity size was mildly dilated. Wall thickness was normal. Systolic function was normal. - Right atrium: The atrium was moderately dilated. - Tricuspid valve: Mild regurgitation. -  Pulmonary arteries: Systolic pressure was within the normal range. - Inferior vena cava: The vessel was dilated; the respirophasic diameter changes were blunted (< 50%); findings are consistent with elevated central venous pressure. - Impressions: When compared to the prior study from 2014 left ventricle is now midly dilated with elevated filling pressures. LVEF is the same estimated at 40-45%. Aortic stenosis is unchanged estimated as moderate with mean gradient 24 mmHg.   Antibiotics:  None   HPI/Subjective: Feeling better and breathing back to normal according to patient. Denies lightheadedness, CP, HA or any other complaints.   Objective: Filed Vitals:   05/18/13 1800 05/18/13 2059 05/19/13 0552 05/19/13 1124  BP: 107/50 128/64 132/78 138/82  Pulse: 69 73 71 66  Temp: 97.8 F (36.6 C) 97.6 F (36.4 C) 98.9 F (37.2 C)   TempSrc: Oral Oral Oral   Resp: 20 18 18    Height:      Weight:   92.216 kg (203 lb 4.8 oz)   SpO2: 93% 97% 95%      Exam:  General: Alert, awake, oriented x3, in no acute distress.  HEENT: No bruits, no goiter. No JVD Heart: irregular, with positive SEM murmurs, no rubs or gallops.  Lungs: Good air movement, no frank crackles Abdomen: Soft, nontender, nondistended, positive bowel sounds.  Neuro: Grossly intact, nonfocal.   Data Reviewed: Basic Metabolic Panel:  Recent Labs Lab 05/15/13 1239 05/17/13 1930 05/18/13 0800 05/19/13 0601  NA 140 140 143 142  K 4.3 4.0 3.9 4.3  CL 106 100 101 100  CO2 26 25 25 23   GLUCOSE 97 105* 94 96  BUN 27* 29* 29* 27*  CREATININE 1.38* 1.38* 1.53* 1.40*  CALCIUM 9.1 9.4 9.1 9.0  MG 1.9  --   --   --    Liver Function Tests:  Recent Labs Lab 05/15/13 1239 05/18/13 0800  AST 16 18  ALT 13 13  ALKPHOS 70 82  BILITOT 0.7 0.5  PROT 6.3 7.2  ALBUMIN 3.8 3.8   CBC:  Recent Labs Lab 05/15/13 1240 05/17/13 1930 05/18/13 0800  WBC 7.0 7.7 7.8  NEUTROABS  --  5.2 4.7  HGB 11.0* 11.9*  12.3*  HCT 35.4* 35.3* 36.2*  MCV 95.5 92.4 91.4  PLT  --  152 161   Cardiac Enzymes:  Recent Labs Lab 05/18/13 0158 05/18/13 0800 05/18/13 1230 05/18/13 1646  TROPONINI <0.30 <0.30 <0.30 <0.30   BNP (last 3 results)  Recent Labs  05/17/13 1930  PROBNP 6425.0*   Studies: Dg Chest 2 View  05/17/2013   CLINICAL DATA:  shortness of breath  EXAM: CHEST  2 VIEW  COMPARISON:  DG CHEST 2V dated 05/02/2013  FINDINGS: Low lung volumes. Cardiac silhouette moderately enlarged minimal atelectasis versus scarring left lung base. No infiltrates nor regions of consolidation. Osseous structures unremarkable.  IMPRESSION: Stable minimal scarring left lung base. No acute cardiopulmonary disease   Electronically Signed   By: Margaree Mackintosh M.D.   On: 05/17/2013 18:04   Ct Angio Chest Pe W/cm &/or Wo Cm  05/17/2013   CLINICAL DATA:  Shortness of breath, evaluate for pulmonary embolism.  EXAM: CT ANGIOGRAPHY CHEST WITH CONTRAST  TECHNIQUE: Multidetector CT imaging of the chest was  performed using the standard protocol during bolus administration of intravenous contrast. Multiplanar CT image reconstructions and MIPs were obtained to evaluate the vascular anatomy.  CONTRAST:  167mL OMNIPAQUE IOHEXOL 350 MG/ML SOLN  COMPARISON:  DG CHEST 2V dated 05/17/2013  FINDINGS: Adequate contrast opacification of the pulmonary artery's. Main pulmonary artery is not enlarged. No pulmonary arterial filling defects to the level of the subsegmental branches.  The heart is mild-to-moderately enlarged. Three vessel coronary artery calcifications. Pericardium is unremarkable. No right heart strain. Thoracic aorta is normal course and caliber, with moderate to severe calcific atherosclerosis. Low-density sub carinal lymph node measures up to 14 mm short axis with small right hilar low-density lymph nodes, to lesser extent on the left. Trace paraseptal and bullous emphysema at the lung apices though, evaluation is limited by  respiratory motion. No focal consolidations or pleural effusions. Mild bronchial wall thickening, tracheobronchial tree is patent. No pneumothorax.  Small air-fluid level in the included view of the esophagus. Low-density partially imaged cysts in the liver measuring up to 2.9 cm (7 Hounsfield units) ; multiple cysts partially imaged in the kidneys. Visualized soft tissues and included osseous structures are nonsuspicious; severe degenerative change of the included cervical spine.  Review of the MIP images confirms the above findings.  IMPRESSION: No acute pulmonary embolism.  Mild to moderate cardiomegaly ; mild bronchial wall thickening could reflect bronchitis without pneumonia.  Prominent mediastinal lymph nodes could be reactive though, are nonspecific.   Electronically Signed   By: Elon Alas   On: 05/17/2013 22:54    Scheduled Meds: . Derrill Memo ON 05/20/2013] aspirin  81 mg Oral Pre-Cath  . aspirin  81 mg Oral q morning - 10a  .  ceFAZolin (ANCEF) IV  2 g Intravenous On Call  . chlorhexidine  60 mL Topical Once  . chlorhexidine  60 mL Topical Once  . Chlorhexidine Gluconate Cloth  6 each Topical Once  . enalapril  1.25 mg Oral Daily  . ezetimibe-simvastatin  1 tablet Oral Daily  . furosemide  20 mg Oral Daily  . gentamicin irrigation  80 mg Irrigation On Call  . heparin  5,000 Units Subcutaneous 3 times per day  . sodium chloride  3 mL Intravenous Q12H  . sodium chloride  3 mL Intravenous Q12H   Continuous Infusions: . [START ON 05/20/2013] sodium chloride    . sodium chloride    . sodium chloride      Time: < 30 minutes  Barton Dubois  [facility] Pager [phone]. If 8PM-8AM, please contact night-coverage at [email or web address], password [facility] 05/19/2013, 3:02 PM  LOS: 2 days

## 2013-05-19 NOTE — CV Procedure (Signed)
    Cardiac Catheterization Procedure Note  Name: Jonathan Acevedo MRN: [id number] DOB: [birth year]  Procedure: Left Heart Cath, Selective Coronary Angiography, LV angiography  Indication: 80 yo WM with history of remote anterior MI and stenting of the LAD in 1998 presents with CHF, syncope, and PVCs   Procedural Details: The right wrist was prepped, draped, and anesthetized with 1% lidocaine. Using the modified Seldinger technique, a 5 French sheath was introduced into the right radial artery. 3 mg of verapamil was administered through the sheath, weight-based unfractionated heparin was administered intravenously. Standard Judkins catheters were used for selective coronary angiography and left ventriculography. Catheter exchanges were performed over an exchange length guidewire. There were no immediate procedural complications. A TR band was used for radial hemostasis at the completion of the procedure.  The patient was transferred to the post catheterization recovery area for further monitoring.  Procedural Findings: Hemodynamics: AO 141/78 mean 107 mm Hg LV 157/28 mm Hg AV gradient of 19 mm Hg  Coronary angiography: Coronary dominance: right  Left mainstem: 20% ostial disease  Left anterior descending (LAD): There is a segment of stents in the proximal LAD that is widely patent. No obstructive disease.  Left circumflex (LCx): Large vessel with 20% disease in the proximal vessel.   Right coronary artery (RCA): Moderately calcified with 20% disease in the proximal and mid vessel.   Left ventriculography: Left ventricular systolic function is abnormal, there is akinesis of the mid to distal anterior wall and entire apex. LVEF is estimated at 35-40%, there is no significant mitral regurgitation   Final Conclusions:   1. Nonobstructive CAD 2. Moderate to severe LV dysfunction. 3. Mild to moderate aortic stenosis by gradient. 4. Elevated LVEDP.  Recommendations: proceed with EP study.  Needs more diuresis.  Ander Slade [facility] 05/19/2013, 4:28 PM

## 2013-05-20 ENCOUNTER — Encounter (HOSPITAL_COMMUNITY): Payer: Self-pay | Admitting: *Deleted

## 2013-05-20 ENCOUNTER — Ambulatory Visit: Payer: Medicare Other | Admitting: Cardiology

## 2013-05-20 DIAGNOSIS — I2510 Atherosclerotic heart disease of native coronary artery without angina pectoris: Secondary | ICD-10-CM

## 2013-05-20 MED ORDER — ENALAPRIL MALEATE 2.5 MG PO TABS
2.5000 mg | ORAL_TABLET | Freq: Every day | ORAL | Status: DC
  Administered 2013-05-20: 2.5 mg via ORAL
  Filled 2013-05-20: qty 1

## 2013-05-20 MED ORDER — ENALAPRIL MALEATE 2.5 MG PO TABS: 2.5000 mg | ORAL_TABLET | Freq: Every day | ORAL | Status: DC

## 2013-05-20 MED ORDER — FUROSEMIDE 20 MG PO TABS: 40.0000 mg | ORAL_TABLET | Freq: Every day | ORAL | Status: DC

## 2013-05-20 NOTE — Progress Notes (Signed)
Patient called the unit at 1758 to ask about medication order of enalapril 2.5mg  tablet listed on his AVS form. Wanted clarification on administration again. 15 Dr.Madera paged and notified. Spoke with Dr.Madera to confirm medication order on AVS. Pt.informed that he is to take the enalapril 2.5mg  tablet 1 whole tablet by mouth daily (2.5mg  total). Pt.repeated back the information and verbalized understanding.

## 2013-05-20 NOTE — Discharge Summary (Signed)
Physician Discharge Summary  Jonathan Acevedo KXF:[id number] DOB: 1928-09-17 DOA: 05/17/2013  PCP: Jonathan Ou, MD  Admit date: 05/17/2013 Discharge date: 05/20/2013  Time spent: >30 minutes  Recommendations for Outpatient Follow-up:  1-BMET to follow electrolytes and renal function 2-reassess BP and adjust antihypertensive regimen as needed  BNP    Component Value Date/Time   PROBNP 6425.0* 05/17/2013 1930   Filed Weights   05/18/13 0104 05/19/13 0552 05/20/13 0705  Weight: 93.713 kg (206 lb 9.6 oz) 92.216 kg (203 lb 4.8 oz) 90.6 kg (199 lb 11.8 oz)     Discharge Diagnoses:  Principal Problem:   Acute on chronic combined systolic and diastolic CHF (congestive heart failure) Active Problems:   Hypertension   Renal insufficiency   Aortic stenosis   Ischemic cardiomyopathy   Syncope   Discharge Condition: stable and improved. Will follow with cardiology in 1 week and with PCP in 2 weeks.   Diet recommendation: Los sodium diet  History of present illness:  80 yr old male with hx of CAD with remote MI and prior stent to the LAD, HTN, CKD, moderate AS and HLD who presents with LE edema and SOB  Pt states that for the past 2 weeks he has been experiencing worsening orthopnea to the point that he thought he could not wait for his cardiology clinic appointment on Teusday and came into the ER. States that he has been otherwise in good health , swims and runs 1 mile until about a month ago. He had an episode while walking back from picking his mail when he became dizzy / lightheaded and had syncope and broke his right leg. Per pt he went to OSH ER and was told that he had PVC's on EKG and d/c . Thereafter at home he began to feel the orthopnea, PND , LE edema and SOB with exertion . He was prescribed lasix by his PCP that helped his edema . Additionally his vasoetc dose was decreased and his flomax was d/c to help with his dizziness.    Hospital Course:  1-Acute on chronic combined  systolic and diastolic CHF (congestive heart failure): improved with IV lasix. Now patient denying SOB, CP or orthopnea.  -troponin neg X 3  -no orthostatic changes -2-D echo with EF 40-45% and septal/inferior wall akinesis  -will continue daily dose lasix (40mg ) by mouth as recommended by cardiology -continue low sodium diet and daily weight  -given decrease EF and following cardiology rec's will start enalapril (2.5mg )  -patient down approx 8 pounds from weight on admission.  2-Syncope: as mentioned below, no inducible VT or SVT by EP studies. Status post loop recorder implantation. Patient has remained asymptomatic and specific syncope etiology remains unclear.    3-Ischemic cardiomyopathy: patient deneis CP and troponin negative.  -given risk factors, hx of syncope and presentation with decrease exercise tolerance; patient had cardiac cath on 4/13 (demonstarting non obstructive CAD) and invasive electrophysiology studies that were negative for inducible VT or SVT  -patient now status post loop recorder implantation  -no driving for until follow up with cardiology  4-Hypertension: stable. Will continue current regimen  -no orthostatic changes  5-Aortic stenosis: per 2-D echo unchanged. Cardiology on board. Will follow rec's   6-Renal insufficiency: stable and back to baseline  -resume oral low dose lasix on daily basis (per cardiology rec's 40mg  daily) -follow BMET closely as an outpatient   7-non obstructive CAD: no CP or SOB -patient troponin negative  -non obstructive CAD per heart  cath 4/13 -continue medical management to modify risk factors.   Procedures:  ECHO:  - Left ventricle: The cavity size was mildly dilated. There was moderate concentric hypertrophy. Systolic function was mildly to moderately reduced. The estimated ejection fraction was in the range of 40% to 45%. There is akinesis of the mid anteroseptal, distal septal and inferior wall and true apex. Doppler  parameters are consistent with abnormal left ventricular relaxation (grade 1 diastolic dysfunction). Doppler parameters are consistent with elevated ventricular end-diastolic filling pressure. - Aortic valve: Cusp separation was mildly reduced. There was moderate stenosis. Valve area: 1.25cm^2(VTI). Valve area: 1.1cm^2 (Vmax). - Mitral valve: Calcified annulus. Mildly thickened leaflets . Transvalvular velocity was within the normal range. There was no evidence for stenosis. No regurgitation. - Left atrium: The atrium was severely dilated. - Right ventricle: The cavity size was mildly dilated. Wall thickness was normal. Systolic function was normal. - Right atrium: The atrium was moderately dilated. - Tricuspid valve: Mild regurgitation. - Pulmonary arteries: Systolic pressure was within the normal range. - Inferior vena cava: The vessel was dilated; the respirophasic diameter changes were blunted (< 50%); findings are consistent with elevated central venous pressure. - Impressions: When compared to the prior study from 2014 left ventricle is now midly dilated with elevated filling pressures. LVEF is the same estimated at 40-45%. Aortic stenosis is unchanged estimated as moderate with mean gradient 24 mmHg.   Heart cath 05/19/13 1. Nonobstructive CAD  2. Moderate to severe LV dysfunction.  3. Mild to moderate aortic stenosis by gradient.  4. Elevated LVEDP.   Invasive EP inducible studies: negative for VT or SVT  Status post implantation Loop recorder    Consultations:  Cardiology   Discharge Exam: Filed Vitals:   05/20/13 0942  BP: 103/43  Pulse: 57  Temp:   Resp:    General: Alert, awake, oriented x3, in no acute distress.  HEENT: No bruits, no goiter. No JVD  Heart: rate controlled, with positive SEM murmurs, no rubs or gallops.  Lungs: Good air movement, no frank crackles  Abdomen: Soft, nontender, nondistended, positive bowel sounds.  Neuro: Grossly  intact, nonfocal.  Discharge Instructions  Discharge Orders   Future Appointments Provider Department Dept Phone   05/28/2013 3:30 PM [facility] [phone]   06/04/2013 9:00 AM Jonathan Junes, NP [facility] [phone]   Future Orders Complete By Expires   Diet - low sodium heart healthy  As directed    Discharge instructions  As directed        Medication List         aspirin 325 MG EC tablet  Take 325 mg by mouth every morning.     enalapril 2.5 MG tablet  Commonly known as:  VASOTEC  Take 1 tablet (2.5 mg total) by mouth daily. Take 1/2 tablet daily     furosemide 20 MG tablet  Commonly known as:  LASIX  Take 2 tablets (40 mg total) by mouth daily.     HYDROcodone-acetaminophen 5-325 MG per tablet  Commonly known as:  NORCO/VICODIN  Take 1 tablet by mouth every 6 (six) hours as needed for moderate pain.     nitroGLYCERIN 0.4 MG SL tablet  Commonly known as:  NITROSTAT  Place 1 tablet (0.4 mg total) under the tongue every 5 (five) minutes as needed for chest pain.     oyster calcium 500 MG Tabs tablet  Take 500 mg of elemental calcium by  mouth 2 (two) times daily.     VITAMIN D PO  Take by mouth.     VYTORIN 10-20 MG per tablet  Generic drug:  ezetimibe-simvastatin  Take 1 tablet by mouth daily.       Allergies  Allergen Reactions  . Risedronate Sodium Other (See Comments)    Reaction=unknown       Follow-up Information   Follow up with [facility] On 05/28/2013. (At 3:30 PM for wound check)    Specialty:  Cardiology   Contact information:   [address], [place] [address] [phone]      Follow up with Truitt Merle, NP On 06/04/2013. (At 9:00 AM for hospital follow-up (Dr. Doug Sou NP))    Specialty:  Nurse Practitioner   Contact information:   [address] [place] [address] [phone]       Follow up with Jonathan Ou, MD. Schedule an appointment as soon as possible for a visit in 2 weeks.   Specialty:  Family Medicine   Contact information:   [address] [place] [address] [phone]        The results of significant diagnostics from this hospitalization (including imaging, microbiology, ancillary and laboratory) are listed below for reference.    Significant Diagnostic Studies: Dg Chest 2 View  05/17/2013   CLINICAL DATA:  shortness of breath  EXAM: CHEST  2 VIEW  COMPARISON:  DG CHEST 2V dated 05/02/2013  FINDINGS: Low lung volumes. Cardiac silhouette moderately enlarged minimal atelectasis versus scarring left lung base. No infiltrates nor regions of consolidation. Osseous structures unremarkable.  IMPRESSION: Stable minimal scarring left lung base. No acute cardiopulmonary disease   Electronically Signed   By: Margaree Mackintosh M.D.   On: 05/17/2013 18:04   Dg Ankle Complete Right  05/13/2013   CLINICAL DATA:  Right ankle injury.  EXAM: RIGHT ANKLE - COMPLETE 3+ VIEW  COMPARISON:  None.  FINDINGS: Mildly displaced oblique fracture distal right fibula is noted. The distal tibia and talus appear intact. Joint spaces are intact.  IMPRESSION: Mildly displaced distal right fibular oblique fracture.   Electronically Signed   By: Sabino Dick M.D.   On: 05/13/2013 12:51   Ct Angio Chest Pe W/cm &/or Wo Cm  05/17/2013   CLINICAL DATA:  Shortness of breath, evaluate for pulmonary embolism.  EXAM: CT ANGIOGRAPHY CHEST WITH CONTRAST  TECHNIQUE: Multidetector CT imaging of the chest was performed using the standard protocol during bolus administration of intravenous contrast. Multiplanar CT image reconstructions and MIPs were obtained to evaluate the vascular anatomy.  CONTRAST:  158mL OMNIPAQUE IOHEXOL 350 MG/ML SOLN  COMPARISON:  DG CHEST 2V dated 05/17/2013  FINDINGS: Adequate contrast opacification of the pulmonary artery's. Main pulmonary artery is not enlarged. No  pulmonary arterial filling defects to the level of the subsegmental branches.  The heart is mild-to-moderately enlarged. Three vessel coronary artery calcifications. Pericardium is unremarkable. No right heart strain. Thoracic aorta is normal course and caliber, with moderate to severe calcific atherosclerosis. Low-density sub carinal lymph node measures up to 14 mm short axis with small right hilar low-density lymph nodes, to lesser extent on the left. Trace paraseptal and bullous emphysema at the lung apices though, evaluation is limited by respiratory motion. No focal consolidations or pleural effusions. Mild bronchial wall thickening, tracheobronchial tree is patent. No pneumothorax.  Small air-fluid level in the included view of the esophagus. Low-density partially imaged  cysts in the liver measuring up to 2.9 cm (7 Hounsfield units) ; multiple cysts partially imaged in the kidneys. Visualized soft tissues and included osseous structures are nonsuspicious; severe degenerative change of the included cervical spine.  Review of the MIP images confirms the above findings.  IMPRESSION: No acute pulmonary embolism.  Mild to moderate cardiomegaly ; mild bronchial wall thickening could reflect bronchitis without pneumonia.  Prominent mediastinal lymph nodes could be reactive though, are nonspecific.   Electronically Signed   By: Elon Alas   On: 05/17/2013 22:54   Dg Foot Complete Right  05/13/2013   CLINICAL DATA:  Right foot injury  EXAM: RIGHT FOOT COMPLETE - 3+ VIEW  COMPARISON:  None.  FINDINGS: Three views of the right foot submitted. No acute fracture or subluxation. Mild dorsal spurring of navicular. Small plantar spur of calcaneus. Mild degenerative changes first metatarsal phalangeal joint.  IMPRESSION: No acute fracture or subluxation. Mild degenerative changes as described above.   Electronically Signed   By: Lahoma Crocker M.D.   On: 05/13/2013 12:49   Labs: Basic Metabolic Panel:  Recent  Labs Lab 05/15/13 1239 05/17/13 1930 05/18/13 0800 05/19/13 0601 05/19/13 2208  NA 140 140 143 142  --   K 4.3 4.0 3.9 4.3  --   CL 106 100 101 100  --   CO2 26 25 25 23   --   GLUCOSE 97 105* 94 96  --   BUN 27* 29* 29* 27*  --   CREATININE 1.38* 1.38* 1.53* 1.40* 1.25  CALCIUM 9.1 9.4 9.1 9.0  --   MG 1.9  --   --   --   --    Liver Function Tests:  Recent Labs Lab 05/15/13 1239 05/18/13 0800  AST 16 18  ALT 13 13  ALKPHOS 70 82  BILITOT 0.7 0.5  PROT 6.3 7.2  ALBUMIN 3.8 3.8   CBC:  Recent Labs Lab 05/15/13 1240 05/17/13 1930 05/18/13 0800 05/19/13 2208  WBC 7.0 7.7 7.8 6.7  NEUTROABS  --  5.2 4.7  --   HGB 11.0* 11.9* 12.3* 12.3*  HCT 35.4* 35.3* 36.2* 37.1*  MCV 95.5 92.4 91.4 93.0  PLT  --  152 161 166   Cardiac Enzymes:  Recent Labs Lab 05/18/13 0158 05/18/13 0800 05/18/13 1230 05/18/13 1646  TROPONINI <0.30 <0.30 <0.30 <0.30   BNP: BNP (last 3 results)  Recent Labs  05/17/13 1930  PROBNP 6425.0*   CBG:  Recent Labs Lab 05/19/13 0606  GLUCAP 95    Signed:  Barton Dubois  [facility] 05/20/2013, 11:47 AM

## 2013-05-20 NOTE — Progress Notes (Signed)
Patient complained of nausea. Zofran given. Advised to not eat or drink anything until breakfast.

## 2013-05-20 NOTE — Progress Notes (Signed)
05/20/13 Pt.is A/O x4 and is ambulatory with walker. He had c/o headache pain during the shift and prn pain medication was given. He had so signs of distress. Discharge paperwork was given and discussed. IV taken out by Kathlee Nations, student nurse in the presence of her nursing instructor.

## 2013-05-20 NOTE — Progress Notes (Signed)
Patient ID: Jonathan Acevedo, male   DOB: 03/02/1928, 80 y.o.   MRN: [id number]   Patient Name: Jonathan Acevedo Date of Encounter: 05/20/2013     Principal Problem:   Acute on chronic combined systolic and diastolic CHF (congestive heart failure) Active Problems:   Hypertension   Renal insufficiency   Aortic stenosis   Ischemic cardiomyopathy   Syncope    SUBJECTIVE  No chest pain or sob.  CURRENT MEDS . aspirin  81 mg Oral q morning - 10a  . enalapril  1.25 mg Oral Daily  . ezetimibe-simvastatin  1 tablet Oral Daily  . furosemide  40 mg Oral Daily  . heparin  5,000 Units Subcutaneous 3 times per day  . sodium chloride  3 mL Intravenous Q12H    OBJECTIVE  Filed Vitals:   05/19/13 2215 05/20/13 0015 05/20/13 0210 05/20/13 0705  BP: 126/72 108/68 106/72 123/64  Pulse: 65 62 62 55  Temp:  98 F (36.7 C)  98 F (36.7 C)  TempSrc:  Oral  Oral  Resp: 18 17 18 19   Height:      Weight:    199 lb 11.8 oz (90.6 kg)  SpO2: 98% 96% 96% 91%    Intake/Output Summary (Last 24 hours) at 05/20/13 0846 Last data filed at 05/20/13 0837  Gross per 24 hour  Intake    720 ml  Output   1125 ml  Net   -405 ml   Filed Weights   05/18/13 0104 05/19/13 0552 05/20/13 0705  Weight: 206 lb 9.6 oz (93.713 kg) 203 lb 4.8 oz (92.216 kg) 199 lb 11.8 oz (90.6 kg)    PHYSICAL EXAM  General: Pleasant, NAD. Neuro: Alert and oriented X 3. Moves all extremities spontaneously. HEENT:  Normal  Neck: Supple without bruits or JVD. Lungs:  Resp regular and unlabored, CTA. Heart: RRR no s3, s4, or murmurs. Abdomen: Soft, non-tender, non-distended, BS + x 4.  Extremities: No clubbing, cyanosis or edema. DP/PT/Radials 2+ and equal bilaterally.  Accessory Clinical Findings  CBC  Recent Labs  05/17/13 1930 05/18/13 0800 05/19/13 2208  WBC 7.7 7.8 6.7  NEUTROABS 5.2 4.7  --   HGB 11.9* 12.3* 12.3*  HCT 35.3* 36.2* 37.1*  MCV 92.4 91.4 93.0  PLT 152 161 124   Basic Metabolic Panel  Recent  Labs  05/18/13 0800 05/19/13 0601 05/19/13 2208  NA 143 142  --   K 3.9 4.3  --   CL 101 100  --   CO2 25 23  --   GLUCOSE 94 96  --   BUN 29* 27*  --   CREATININE 1.53* 1.40* 1.25  CALCIUM 9.1 9.0  --    Liver Function Tests  Recent Labs  05/18/13 0800  AST 18  ALT 13  ALKPHOS 82  BILITOT 0.5  PROT 7.2  ALBUMIN 3.8   No results found for this basename: LIPASE, AMYLASE,  in the last 72 hours Cardiac Enzymes  Recent Labs  05/18/13 0800 05/18/13 1230 05/18/13 1646  TROPONINI <0.30 <0.30 <0.30   BNP No components found with this basename: POCBNP,  D-Dimer No results found for this basename: DDIMER,  in the last 72 hours Hemoglobin A1C No results found for this basename: HGBA1C,  in the last 72 hours Fasting Lipid Panel No results found for this basename: CHOL, HDL, LDLCALC, TRIG, CHOLHDL, LDLDIRECT,  in the last 72 hours Thyroid Function Tests No results found for this basename: TSH, T4TOTAL, FREET3, T3FREE, THYROIDAB,  in the last 72 hours  TELE NSR   Radiology/Studies  Dg Chest 2 View  05/17/2013   CLINICAL DATA:  shortness of breath  EXAM: CHEST  2 VIEW  COMPARISON:  DG CHEST 2V dated 05/02/2013  FINDINGS: Low lung volumes. Cardiac silhouette moderately enlarged minimal atelectasis versus scarring left lung base. No infiltrates nor regions of consolidation. Osseous structures unremarkable.  IMPRESSION: Stable minimal scarring left lung base. No acute cardiopulmonary disease   Electronically Signed   By: Margaree Mackintosh M.D.   On: 05/17/2013 18:04   Dg Ankle Complete Right  05/13/2013   CLINICAL DATA:  Right ankle injury.  EXAM: RIGHT ANKLE - COMPLETE 3+ VIEW  COMPARISON:  None.  FINDINGS: Mildly displaced oblique fracture distal right fibula is noted. The distal tibia and talus appear intact. Joint spaces are intact.  IMPRESSION: Mildly displaced distal right fibular oblique fracture.   Electronically Signed   By: Sabino Dick M.D.   On: 05/13/2013 12:51   Ct  Angio Chest Pe W/cm &/or Wo Cm  05/17/2013   CLINICAL DATA:  Shortness of breath, evaluate for pulmonary embolism.  EXAM: CT ANGIOGRAPHY CHEST WITH CONTRAST  TECHNIQUE: Multidetector CT imaging of the chest was performed using the standard protocol during bolus administration of intravenous contrast. Multiplanar CT image reconstructions and MIPs were obtained to evaluate the vascular anatomy.  CONTRAST:  154mL OMNIPAQUE IOHEXOL 350 MG/ML SOLN  COMPARISON:  DG CHEST 2V dated 05/17/2013  FINDINGS: Adequate contrast opacification of the pulmonary artery's. Main pulmonary artery is not enlarged. No pulmonary arterial filling defects to the level of the subsegmental branches.  The heart is mild-to-moderately enlarged. Three vessel coronary artery calcifications. Pericardium is unremarkable. No right heart strain. Thoracic aorta is normal course and caliber, with moderate to severe calcific atherosclerosis. Low-density sub carinal lymph node measures up to 14 mm short axis with small right hilar low-density lymph nodes, to lesser extent on the left. Trace paraseptal and bullous emphysema at the lung apices though, evaluation is limited by respiratory motion. No focal consolidations or pleural effusions. Mild bronchial wall thickening, tracheobronchial tree is patent. No pneumothorax.  Small air-fluid level in the included view of the esophagus. Low-density partially imaged cysts in the liver measuring up to 2.9 cm (7 Hounsfield units) ; multiple cysts partially imaged in the kidneys. Visualized soft tissues and included osseous structures are nonsuspicious; severe degenerative change of the included cervical spine.  Review of the MIP images confirms the above findings.  IMPRESSION: No acute pulmonary embolism.  Mild to moderate cardiomegaly ; mild bronchial wall thickening could reflect bronchitis without pneumonia.  Prominent mediastinal lymph nodes could be reactive though, are nonspecific.   Electronically Signed   By:  Elon Alas   On: 05/17/2013 22:54   Dg Foot Complete Right  05/13/2013   CLINICAL DATA:  Right foot injury  EXAM: RIGHT FOOT COMPLETE - 3+ VIEW  COMPARISON:  None.  FINDINGS: Three views of the right foot submitted. No acute fracture or subluxation. Mild dorsal spurring of navicular. Small plantar spur of calcaneus. Mild degenerative changes first metatarsal phalangeal joint.  IMPRESSION: No acute fracture or subluxation. Mild degenerative changes as described above.   Electronically Signed   By: Lahoma Crocker M.D.   On: 05/13/2013 12:49    ASSESSMENT AND PLAN  1. Syncope 2. ICM, EF 40% 3. S/p EP study, negative for inducible VT with ILR inserted Rec: ok for discharge. He will continue his home meds. Followup with Dr. Martin[place].  Gregg Taylor,M.D.  05/20/2013 8:46 AM

## 2013-05-20 NOTE — Telephone Encounter (Signed)
Patient currently in [facility].

## 2013-05-20 NOTE — Discharge Instructions (Signed)
° °  Supplemental Discharge Instructions following   EP study and Loop Recorder Insertion  NO DRIVING for 6 months until given clearance by Dr. Martin[place].  GROIN SITE CARE No lifting over 5 lbs for 1 week. No sexual activity for 1 week. Keep procedure site clean & dry. If you notice increased pain, swelling, bleeding or pus, call/return!  You may shower, but no soaking baths/hot tubs/pools for 1 week.   LEFT CHEST / ILR INSERTION SITE CARE   Keep the wound area clean and dry.  Do not get this area wet for 1 week. You may shower in 1 week.   The tape/steri-strips on your wound will fall off on their own; do not pull them off.  No bandage is needed on the site.  DO NOT apply any creams, oils, or ointments to the wound area.   If you notice any drainage or discharge from the wound, any swelling or bruising at the site, or you develop a fever > 101? F after you are discharged home, call the office at once. _____________________________________________________________________________________

## 2013-05-20 NOTE — Op Note (Signed)
NAMERYO, KLANG NO.:  [id number]  MEDICAL RECORD NO.:  [id number]  LOCATION:  [id number]                        FACILITY:  [facility]  PHYSICIAN:  Champ Mungo. Lovena Le, MD    DATE OF BIRTH:  [birth year]  DATE OF PROCEDURE:  05/19/2013 DATE OF DISCHARGE:                              OPERATIVE REPORT   PROCEDURE PERFORMED:  Invasive electrophysiologic study followed by insertion of an implantable loop recorder.  INDICATION:  Unexplained syncope in a patient with an ischemic cardiomyopathy and ejection fraction of 40%.  INTRODUCTION:  The patient is an 80 year old man who experienced a syncopal episode, and the etiology was unclear.  His symptoms were not necessarily consistent with arrhythmic syncope, but he had not had syncope in the past, and for this reason, was hospitalized.  Cardiac enzymes were unremarkable.  He underwent catheterization because of the syncope, demonstrated that his stent was patent and that he had left ventricular dysfunction with anterior hypokinesis.  He had apical hypokinesis as well.  He is now referred for invasive electrophysiologic study and if positive, ICD implantation and if negative for inducible syncope, insertion of an implantable loop recorder.  DESCRIPTION OF PROCEDURE:  After informed consent was obtained, the patient was taken to the diagnostic EP lab in a fasting state.  After usual preparation and draping, intravenous fentanyl and midazolam were given for sedation.  A 6-French quadripolar catheter was inserted percutaneously in the right femoral vein and advanced to the right ventricle.  A 6-French quadripolar catheter was inserted percutaneously in the right femoral vein and advanced to the His bundle region.  A 6- French quadripolar catheter was inserted percutaneously in the right femoral vein and advanced to the right atrium.  After measurement of basic intervals, rapid ventricular pacing was carried out from the  right ventricle, demonstrating VA Wenckebach cycle length of 350 milliseconds. During rapid ventricular pacing, the atrial activation sequence was midline and decremental.  Programed ventricular stimulation was carried out from the right ventricle at a base drive cycle length of 500 milliseconds.  The S1-S2 interval stepwise decreased from 440 milliseconds down to 340 milliseconds where the retrograde AV node ERP was observed.  During programed ventricular stimulation, the atrial activation was again nondecremental.  The S2 interval was stepwise decreased down to 270 milliseconds where ventricular refractoriness was observed.  S1-S2-S3 and S1-S2-S3-S4 stimuli were then delivered with the S2-S3 and S3-S4 interval stepwise decreased down to ventricular refractoriness.  During programed ventricular stimulation, there was no inducible VT.  Next, basic drive cycle length of 400 milliseconds was carried out and S1-S2, S1-S2-S3, and S1-S2-S3-S4 stimuli were delivered with S2-S3 and S3-S4 interval stepwise decreased down to ventricular refractoriness.  Again, there was no inducible VT.  Finally, programed ventricular stimulation was carried out at an S1-S2 coupling interval of 400/600.  The S2-S3 and S3-S4 intervals were stepwise decreased down to ventricular refractoriness, and again there was no inducible VT.  Next, the rapid atrial pacing was carried out from the atrium at a paced cycle length of 500 milliseconds and stepwise decreased down to 330 milliseconds where AV Wenckebach was observed.  During rapid atrial pacing, the PR interval was equal  to the RR interval, but there was no inducible SVT or VT. Finally, programed atrial stimulation was carried out from the atrium at a base drive cycle length of 500 milliseconds.  The S1-S2 interval stepwise decreased down to 290 milliseconds where atrial refractoriness was observed.  During programed atrial stimulation, there were no AH jumps, no  echo beats, but the PR interval was greater than the RR interval.  At this point, the catheters were removed and the patient was prepped for insertion of an implantable loop recorder.  After the patient was prepped in the usual manner, a 30 mL of lidocaine was infiltrated over the left pectoral region.  A 1-cm stab incision was carried out, and the Medtronic implantable loop recorder serial #[id number] was inserted into the subcutaneous space.  The R-waves measured at 0.23 mV.  The P-waves was present and the incision was closed with benzoin Steri-Strips.  The patient was returned to the recovery area in satisfactory condition.  COMPLICATIONS:  There were no immediate procedure complications.  RESULTS:  This demonstrate invasive electrophysiologic study with no inducible VT or SVT despite an aggressive protocol programed atrial and ventricular stimulation as well as rapid atrial and ventricular pacing. Finally, it demonstrated successful insertion of a Medtronic implantable loop recorder in a patient with an ischemic cardiomyopathy and negative electrophysiologic study for unexplained syncope.     Champ Mungo. Lovena Le, MD     GWT/MEDQ  D:  05/19/2013  T:  05/20/2013  Job:  [id number]

## 2013-05-21 ENCOUNTER — Telehealth: Payer: Self-pay | Admitting: Cardiology

## 2013-05-21 NOTE — Telephone Encounter (Signed)
Returned call to patient he stated underneath loop recorder bandage dark red blood,no swelling, no pain,warm to touch.Advised will have device nurse call you back.Also stated he is taking Vasotec 2.5 mg daily, B/P low 104/55.B/P averaging less than 245 systolic.Dr.Jordan advised decrease Vasotec 2.5 mg 1/2 tablet daily.Message sent to [facility].

## 2013-05-21 NOTE — Telephone Encounter (Signed)
New message     Patient calling C/O  monitor that put into his chest is bloody.  S/p cath .

## 2013-05-25 ENCOUNTER — Telehealth: Payer: Self-pay | Admitting: Cardiology

## 2013-05-25 ENCOUNTER — Observation Stay (HOSPITAL_COMMUNITY)
Admission: EM | Admit: 2013-05-25 | Discharge: 2013-05-27 | Disposition: A | Discharge status: Home Health | Payer: Medicare Other | Attending: Cardiology | Admitting: Cardiology

## 2013-05-25 ENCOUNTER — Emergency Department (HOSPITAL_COMMUNITY): Payer: Medicare Other

## 2013-05-25 ENCOUNTER — Encounter (HOSPITAL_COMMUNITY): Payer: Self-pay | Admitting: Emergency Medicine

## 2013-05-25 DIAGNOSIS — I359 Nonrheumatic aortic valve disorder, unspecified: Secondary | ICD-10-CM

## 2013-05-25 DIAGNOSIS — R0602 Shortness of breath: Secondary | ICD-10-CM

## 2013-05-25 DIAGNOSIS — I509 Heart failure, unspecified: Secondary | ICD-10-CM | POA: Insufficient documentation

## 2013-05-25 DIAGNOSIS — N179 Acute kidney failure, unspecified: Secondary | ICD-10-CM

## 2013-05-25 DIAGNOSIS — Z87891 Personal history of nicotine dependence: Secondary | ICD-10-CM | POA: Insufficient documentation

## 2013-05-25 DIAGNOSIS — I4891 Unspecified atrial fibrillation: Secondary | ICD-10-CM | POA: Insufficient documentation

## 2013-05-25 DIAGNOSIS — R609 Edema, unspecified: Secondary | ICD-10-CM | POA: Insufficient documentation

## 2013-05-25 DIAGNOSIS — E876 Hypokalemia: Secondary | ICD-10-CM | POA: Insufficient documentation

## 2013-05-25 DIAGNOSIS — I5023 Acute on chronic systolic (congestive) heart failure: Principal | ICD-10-CM | POA: Diagnosis present

## 2013-05-25 DIAGNOSIS — I97190 Other postprocedural cardiac functional disturbances following cardiac surgery: Secondary | ICD-10-CM | POA: Insufficient documentation

## 2013-05-25 DIAGNOSIS — I252 Old myocardial infarction: Secondary | ICD-10-CM | POA: Insufficient documentation

## 2013-05-25 DIAGNOSIS — E7800 Pure hypercholesterolemia, unspecified: Secondary | ICD-10-CM | POA: Insufficient documentation

## 2013-05-25 DIAGNOSIS — I350 Nonrheumatic aortic (valve) stenosis: Secondary | ICD-10-CM | POA: Diagnosis present

## 2013-05-25 DIAGNOSIS — Z7982 Long term (current) use of aspirin: Secondary | ICD-10-CM | POA: Insufficient documentation

## 2013-05-25 DIAGNOSIS — N183 Chronic kidney disease, stage 3 unspecified: Secondary | ICD-10-CM | POA: Diagnosis present

## 2013-05-25 DIAGNOSIS — I129 Hypertensive chronic kidney disease with stage 1 through stage 4 chronic kidney disease, or unspecified chronic kidney disease: Secondary | ICD-10-CM | POA: Insufficient documentation

## 2013-05-25 DIAGNOSIS — I255 Ischemic cardiomyopathy: Secondary | ICD-10-CM | POA: Diagnosis present

## 2013-05-25 DIAGNOSIS — Z9861 Coronary angioplasty status: Secondary | ICD-10-CM | POA: Insufficient documentation

## 2013-05-25 DIAGNOSIS — N189 Chronic kidney disease, unspecified: Secondary | ICD-10-CM

## 2013-05-25 DIAGNOSIS — I10 Essential (primary) hypertension: Secondary | ICD-10-CM | POA: Diagnosis present

## 2013-05-25 DIAGNOSIS — I5043 Acute on chronic combined systolic (congestive) and diastolic (congestive) heart failure: Secondary | ICD-10-CM

## 2013-05-25 DIAGNOSIS — I2589 Other forms of chronic ischemic heart disease: Secondary | ICD-10-CM | POA: Insufficient documentation

## 2013-05-25 DIAGNOSIS — I2510 Atherosclerotic heart disease of native coronary artery without angina pectoris: Secondary | ICD-10-CM | POA: Insufficient documentation

## 2013-05-25 LAB — COMPREHENSIVE METABOLIC PANEL
ALT: 14 U/L (ref 0–53)
AST: 17 U/L (ref 0–37)
Albumin: 3.7 g/dL (ref 3.5–5.2)
Alkaline Phosphatase: 86 U/L (ref 39–117)
BILIRUBIN TOTAL: 0.5 mg/dL (ref 0.3–1.2)
BUN: 29 mg/dL — ABNORMAL HIGH (ref 6–23)
CHLORIDE: 100 meq/L (ref 96–112)
CO2: 26 meq/L (ref 19–32)
Calcium: 10 mg/dL (ref 8.4–10.5)
Creatinine, Ser: 1.57 mg/dL — ABNORMAL HIGH (ref 0.50–1.35)
GFR, EST AFRICAN AMERICAN: 45 mL/min — AB (ref >90)
GFR, EST NON AFRICAN AMERICAN: 39 mL/min — AB (ref >90)
Glucose, Bld: 103 mg/dL — ABNORMAL HIGH (ref 70–99)
Potassium: 4.3 mEq/L (ref 3.7–5.3)
Sodium: 142 mEq/L (ref 137–147)
Total Protein: 7.5 g/dL (ref 6.0–8.3)

## 2013-05-25 LAB — CBC WITH DIFFERENTIAL/PLATELET
Basophils Absolute: 0 10*3/uL (ref 0.0–0.1)
Basophils Relative: 0 % (ref 0–1)
EOS PCT: 2 % (ref 0–5)
Eosinophils Absolute: 0.2 10*3/uL (ref 0.0–0.7)
HEMATOCRIT: 37.9 % — AB (ref 39.0–52.0)
Hemoglobin: 12.5 g/dL — ABNORMAL LOW (ref 13.0–17.0)
LYMPHS PCT: 22 % (ref 12–46)
Lymphs Abs: 1.6 10*3/uL (ref 0.7–4.0)
MCH: 30.3 pg (ref 26.0–34.0)
MCHC: 33 g/dL (ref 30.0–36.0)
MCV: 91.8 fL (ref 78.0–100.0)
MONOS PCT: 8 % (ref 3–12)
Monocytes Absolute: 0.6 10*3/uL (ref 0.1–1.0)
NEUTROS ABS: 4.9 10*3/uL (ref 1.7–7.7)
Neutrophils Relative %: 68 % (ref 43–77)
Platelets: 217 10*3/uL (ref 150–400)
RBC: 4.13 MIL/uL — AB (ref 4.22–5.81)
RDW: 14.1 % (ref 11.5–15.5)
WBC: 7.4 10*3/uL (ref 4.0–10.5)

## 2013-05-25 LAB — I-STAT TROPONIN, ED: TROPONIN I, POC: 0 ng/mL (ref 0.00–0.08)

## 2013-05-25 LAB — PRO B NATRIURETIC PEPTIDE: Pro B Natriuretic peptide (BNP): 3627 pg/mL — ABNORMAL HIGH (ref 0–450)

## 2013-05-25 MED ORDER — EZETIMIBE-SIMVASTATIN 10-20 MG PO TABS
1.0000 | ORAL_TABLET | Freq: Every day | ORAL | Status: DC
  Administered 2013-05-26: 1 via ORAL
  Filled 2013-05-25 (×2): qty 1

## 2013-05-25 MED ORDER — FUROSEMIDE 10 MG/ML IJ SOLN
40.0000 mg | Freq: Two times a day (BID) | INTRAMUSCULAR | Status: DC
  Filled 2013-05-25 (×3): qty 4

## 2013-05-25 MED ORDER — ENOXAPARIN SODIUM 40 MG/0.4ML ~~LOC~~ SOLN
40.0000 mg | Freq: Every day | SUBCUTANEOUS | Status: DC
  Administered 2013-05-26 (×2): 40 mg via SUBCUTANEOUS
  Filled 2013-05-25 (×3): qty 0.4

## 2013-05-25 MED ORDER — SODIUM CHLORIDE 0.9 % IJ SOLN
3.0000 mL | INTRAMUSCULAR | Status: DC | PRN
  Administered 2013-05-26: 3 mL via INTRAVENOUS

## 2013-05-25 MED ORDER — FERROUS SULFATE 325 (65 FE) MG PO TABS
325.0000 mg | ORAL_TABLET | Freq: Every day | ORAL | Status: DC
  Administered 2013-05-26 – 2013-05-27 (×2): 325 mg via ORAL
  Filled 2013-05-25 (×2): qty 1

## 2013-05-25 MED ORDER — ASPIRIN EC 325 MG PO TBEC
325.0000 mg | DELAYED_RELEASE_TABLET | Freq: Every morning | ORAL | Status: DC
  Administered 2013-05-26 – 2013-05-27 (×2): 325 mg via ORAL
  Filled 2013-05-25 (×2): qty 1

## 2013-05-25 MED ORDER — SODIUM CHLORIDE 0.9 % IJ SOLN
3.0000 mL | Freq: Two times a day (BID) | INTRAMUSCULAR | Status: DC
  Administered 2013-05-26 – 2013-05-27 (×4): 3 mL via INTRAVENOUS

## 2013-05-25 MED ORDER — LORAZEPAM 1 MG PO TABS
0.5000 mg | ORAL_TABLET | Freq: Once | ORAL | Status: AC
  Administered 2013-05-25: 0.5 mg via ORAL
  Filled 2013-05-25: qty 1

## 2013-05-25 MED ORDER — SODIUM CHLORIDE 0.9 % IV SOLN: 250.0000 mL | INTRAVENOUS | Status: DC | PRN

## 2013-05-25 MED ORDER — FUROSEMIDE 10 MG/ML IJ SOLN
40.0000 mg | Freq: Once | INTRAMUSCULAR | Status: AC
Start: 2013-05-25 — End: 2013-05-25
  Administered 2013-05-25: 40 mg via INTRAVENOUS
  Filled 2013-05-25: qty 4

## 2013-05-25 MED ORDER — NITROGLYCERIN 0.4 MG SL SUBL: 0.4000 mg | SUBLINGUAL_TABLET | SUBLINGUAL | Status: DC | PRN

## 2013-05-25 NOTE — ED Notes (Signed)
Dr. Wynonia Lawman with cardiology at bedside.

## 2013-05-25 NOTE — Telephone Encounter (Signed)
   Pt called earlier this am with a complaint of what sounded like orthopnea/PND. At time of first telephone call, he denied DOE and LEE and weight gain. Symptoms only occurred when sleeping in bed. I instructed him to increase his lasix dose and suggested sleeping with head elevated until symptoms improved.   He called back a second time. He increased his lasix as directed. He noted frequent urination. His weight prior to the increased dose was 206 lbs. His weight after the increase was 203lb. He started sleeping upright in a recliner but he continues to note waking up short of breath and in a panic. This only happens when he falls asleep. He has no symptoms when awake, sitting in the same position. He denies DOE. He denies h/o OSA. His complaints seem a bit atypical of CHF. I offered him an appointment to see an extender in the office tomorrow. However, he dose not want to wait that long. He has opted to come to the ER for evaluation. He also notes chills. No fever. No chest pain.  I told him that he will first be evaluated by ER physician and that they will only consult [place] if they feel his symptoms are cardiac related.

## 2013-05-25 NOTE — H&P (Signed)
History and Physical   Admit date: 05/25/2013 Name:  Jonathan Acevedo Medical record number: [id number] DOB/Age:  Feb 28, 1928  80 y.o. male  Referring Physician: [facility]  Primary Cardiologist: Dr. [name]  Primary Physician:   Dr. Florina Ou  Chief complaint/reason for admission: Shortness of breath  HPI:   This 80 year old male has a history of moderate aortic stenosis and a mildly reduced ejection fraction. He has a history of coronary artery disease with previous anterior infarction and has had a previous LAD stent. He had a syncopal episode and had a prior history of orthostasis that has been precipitated by the addition of Flomax to his regimen as well as Vasotec. He was found to have some PVCs. He was hospitalized on the 11th and had catheterization done on the 13th with findings of a patent proximal stent in the LAD, him nonobstructive disease in the left main, circumflex and RCA. EF was estimated at 35-40%. He had an EP study that showed no inducible arrhythmias and had a loop monitor implanted. He was discharged on the 14th with a weight of 199 pounds. Today he had an episode of orthopnea and had some worsening dyspnea as well as edema. He called the PA on call who advised to take an extra furosemide. He then called back concerned because he was continued to have orthopnea despite diuresis and came to the emergency room. His BNP was elevated at 3000 and he had significant edema. He does not have angina at this time. Card apprehensive about returning home because of his recent syncopal episode as well as his worsening shortness of breath and the fact he was worried that he he fell at home that he might not be able to get assisted back up by his wife.    Past Medical History  Diagnosis Date  . Coronary artery disease     w remote anterior myocardial infarction (1998)  . Hypertension   . Renal insufficiency   . Aortic stenosis   . Rectal polyp   . Hypercholesteremia    . Kidney stones   . 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 Hypercalcemia 12/26/2011  . Afib   . Syncope 05/18/2013      Past Surgical History  Procedure Laterality Date  . Cardiac catheterization  08/18/2008    LMain 20, LAD stent 20 ISR, CFX 10, RCA 20, EF 45  . Umbilical hernia repair    . Inguinal hernia repair      left  . Orif femur fracture      right  . Cataract extraction    . Coronary stent placement  07/19/1996    Proximal LAD  . Parathyroidectomy    . Loop recorder implant  05-19-13    MDT LinQ implanted by Dr Lovena Le for syncope following negative EPS  . Electrophysiology study  05-19-13    EPS for syncope without inducible arrhythmias by Dr Lovena Le   Allergies: is allergic to risedronate sodium.   Medications: Prior to Admission medications   Medication Sig Start Date End Date Taking? Authorizing Provider  aspirin 325 MG EC tablet Take 325 mg by mouth every morning.    Yes Historical Provider, MD  Cholecalciferol (VITAMIN D PO) Take by mouth.   Yes Historical Provider, MD  enalapril (VASOTEC) 2.5 MG tablet Take 1/2 tablet daily 05/21/13  Yes Peter M Martinique, MD  ezetimibe-simvastatin (VYTORIN) 10-20 MG per tablet Take 1 tablet by mouth daily. 05/07/13  Yes Peter M Martinique, MD  ferrous sulfate (  SM IRON) 325 (65 FE) MG tablet Take 325 mg by mouth daily.   Yes Historical Provider, MD  furosemide (LASIX) 20 MG tablet Take 2 tablets (40 mg total) by mouth daily. 05/20/13  Yes Barton Dubois, MD  HYDROcodone-acetaminophen (NORCO/VICODIN) 5-325 MG per tablet Take 1 tablet by mouth every 6 (six) hours as needed for moderate pain. 05/13/13  Yes Leandrew Koyanagi, MD  Oyster Shell (OYSTER CALCIUM) 500 MG TABS Take 500 mg of elemental calcium by mouth 2 (two) times daily.   Yes Historical Provider, MD  nitroGLYCERIN (NITROSTAT) 0.4 MG SL tablet Place 1 tablet (0.4 mg total) under the tongue every 5 (five) minutes as needed for chest pain. 12/26/12   Peter M Martinique, MD    Family History:   Family Status  Relation Status Death Age  . Mother Deceased 51    No known CAD  . Father Deceased 72    No known CAD  . Sister Alive   . Maternal Grandmother Deceased   . Maternal Grandfather Deceased   . Paternal Grandmother Deceased   . Paternal Grandfather Deceased     Social History:   reports that he quit smoking about 16 years ago. His smoking use included Cigarettes. He has a 45 pack-year smoking history. He has never used smokeless tobacco. He reports that he does not drink alcohol or use illicit drugs.   History   Social History Narrative   Lives with wife, generally exercises regularly.     Review of Systems:  Other than as noted above, the remainder of the review of systems is normal  Physical Exam: BP 119/59  Pulse 53  Temp(Src) 97.7 F (36.5 C) (Oral)  Resp 16  Ht 5\' 10"  (1.778 m)  Wt 90.493 kg (199 lb 8 oz)  BMI 28.63 kg/m2  SpO2 94% General appearance: Pleasant white male sitting upright in bed but currently in no acute distress Head: Normocephalic, without obvious abnormality, atraumatic, Balding male hair pattern Eyes: conjunctivae/corneas clear. PERRL, EOM's intact. Fundi benign. Neck: no adenopathy, no carotid bruit, no JVD and supple, symmetrical, trachea midline Lungs: Mild crackles at bases Heart: Regular rhythm with occasional PVCs. High pitched 3/6 harsh systolic murmur of aortic stenosis with radiation to neck Abdomen: soft, non-tender; bowel sounds normal; no masses,  no organomegaly Rectal: deferred Extremities: His right leg is in a soft cast, there is chronic venous insufficiency changes of the left leg with 2+ peripheral edema, peripheral pulses are intact Pulses: 2+ and symmetric Neurologic: Grossly normal  Labs: CBC  Recent Labs  05/25/13 1500  WBC 7.4  RBC 4.13*  HGB 12.5*  HCT 37.9*  PLT 217  MCV 91.8  MCH 30.3  MCHC 33.0  RDW 14.1  LYMPHSABS 1.6  MONOABS 0.6  EOSABS 0.2  BASOSABS 0.0   CMP   Recent Labs   05/25/13 1500  NA 142  K 4.3  CL 100  CO2 26  GLUCOSE 103*  BUN 29*  CREATININE 1.57*  CALCIUM 10.0  PROT 7.5  ALBUMIN 3.7  AST 17  ALT 14  ALKPHOS 86  BILITOT 0.5  GFRNONAA 39*  GFRAA 45*   BNP (last 3 results)  Recent Labs  05/17/13 1930 05/25/13 1500  PROBNP 6425.0* 3627.0*   EKG: Normal sinus rhythm with PVCs and couplets  Radiology: Pulmonary hypertension,   IMPRESSIONS: 1. Acute on chronic systolic congestive heart failure with worsening 2. Moderate aortic stenosis 3. Ischemic cardiomyopathy 4. Stage III chronic kidney disease 5. Recent injury to  right leg as well as catheterization and EP study-rule out pulmonary embolus  PLAN: Observation for overnight intravenous diuresis. He is apprehensive about going home because of the acuity of his symptoms. Obtain d-dimer and lower extremity venous Doppler study to be sure he does not have DVT. He did have a recent CTA that showed no pulmonary embolus one week ago.  Signed: Kerry Hough MD [facility]  05/25/2013, 8:12 PM

## 2013-05-25 NOTE — ED Provider Notes (Addendum)
CSN: [id number]     Arrival date & time 05/25/13  1426 History   First MD Initiated Contact with Patient 05/25/13 1500     Chief Complaint  Patient presents with  . Shortness of Breath     (Consider location/radiation/quality/duration/timing/severity/associated sxs/prior Treatment) Patient is a 80 y.o. male presenting with shortness of breath. The history is provided by the patient.  Shortness of Breath Severity:  Moderate Onset quality:  Gradual Duration:  1 day Timing:  Constant Progression:  Resolved Chronicity:  Recurrent Context comment:  Lying down Relieved by:  Diuretics Exacerbated by: lying down. Ineffective treatments:  None tried Associated symptoms: no abdominal pain, no chest pain, no cough, no diaphoresis, no fever, no sputum production, no vomiting and no wheezing   Risk factors: no recent surgery and no tobacco use   Risk factors comment:  Recent cardiac cath 5 days ago.  d/ced from hospital on the 15th with syncope, chf exacerbation and ari   Past Medical History  Diagnosis Date  . Coronary artery disease     w remote anterior myocardial infarction (1998)  . Hypertension   . Renal insufficiency   . Aortic stenosis   . Rectal polyp   . Hypercholesteremia   . Kidney stones   . 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 Hypercalcemia 12/26/2011  . Afib   . Syncope 05/18/2013   Past Surgical History  Procedure Laterality Date  . Cardiac catheterization  08/18/2008    LMain 20, LAD stent 20 ISR, CFX 10, RCA 20, EF 45  . Umbilical hernia repair    . Inguinal hernia repair      left  . Orif femur fracture      right  . Cataract extraction    . Coronary stent placement  07/19/1996    Proximal LAD  . Parathyroidectomy    . Loop recorder implant  05-19-13    MDT LinQ implanted by Dr Lovena Le for syncope following negative EPS  . Electrophysiology study  05-19-13    EPS for syncope without inducible arrhythmias by Dr Lovena Le   No family history on file. History   Substance Use Topics  . Smoking status: Former Smoker -- 1.00 packs/day for 45 years    Types: Cigarettes    Quit date: 07/19/1996  . Smokeless tobacco: Never Used  . Alcohol Use: No    Review of Systems  Constitutional: Negative for fever and diaphoresis.  Respiratory: Positive for shortness of breath. Negative for cough, sputum production and wheezing.   Cardiovascular: Negative for chest pain.  Gastrointestinal: Negative for vomiting and abdominal pain.  All other systems reviewed and are negative.     Allergies  Risedronate sodium  Home Medications   Prior to Admission medications   Medication Sig Start Date End Date Taking? Authorizing Provider  aspirin 325 MG EC tablet Take 325 mg by mouth every morning.    Yes Historical Provider, MD  Cholecalciferol (VITAMIN D PO) Take by mouth.   Yes Historical Provider, MD  enalapril (VASOTEC) 2.5 MG tablet Take 1/2 tablet daily 05/21/13  Yes Peter M Martinique, MD  ezetimibe-simvastatin (VYTORIN) 10-20 MG per tablet Take 1 tablet by mouth daily. 05/07/13  Yes Peter M Martinique, MD  ferrous sulfate (SM IRON) 325 (65 FE) MG tablet Take 325 mg by mouth daily.   Yes Historical Provider, MD  furosemide (LASIX) 20 MG tablet Take 2 tablets (40 mg total) by mouth daily. 05/20/13  Yes Barton Dubois, MD  HYDROcodone-acetaminophen (NORCO/VICODIN) 5-325 MG per tablet  Take 1 tablet by mouth every 6 (six) hours as needed for moderate pain. 05/13/13  Yes Leandrew Koyanagi, MD  Oyster Shell (OYSTER CALCIUM) 500 MG TABS Take 500 mg of elemental calcium by mouth 2 (two) times daily.   Yes Historical Provider, MD  nitroGLYCERIN (NITROSTAT) 0.4 MG SL tablet Place 1 tablet (0.4 mg total) under the tongue every 5 (five) minutes as needed for chest pain. 12/26/12   Peter M Martinique, MD   BP 122/79  Pulse 75  Temp(Src) 97.7 F (36.5 C) (Oral)  Resp 13  Ht 5\' 10"  (1.778 m)  Wt 199 lb 8 oz (90.493 kg)  BMI 28.63 kg/m2  SpO2 96% Physical Exam  Nursing note and  vitals reviewed. Constitutional: He is oriented to person, place, and time. He appears well-developed and well-nourished. No distress.  HENT:  Head: Normocephalic and atraumatic.  Mouth/Throat: Oropharynx is clear and moist.  Eyes: Conjunctivae and EOM are normal. Pupils are equal, round, and reactive to light.  Neck: Normal range of motion. Neck supple.  Cardiovascular: Normal rate, regular rhythm and intact distal pulses.   Murmur heard.  Systolic murmur is present with a grade of 2/6  Heard best at the left sternal border  Pulmonary/Chest: Effort normal. No respiratory distress. He has no wheezes. He has rales in the right lower field and the left lower field.  Abdominal: Soft. He exhibits no distension. There is no tenderness. There is no rebound and no guarding.  Musculoskeletal: Normal range of motion. He exhibits edema. He exhibits no tenderness.  1+ edema in bilateral lower ext  Neurological: He is alert and oriented to person, place, and time.  Skin: Skin is warm and dry. No rash noted. No erythema.  Psychiatric: He has a normal mood and affect. His behavior is normal.    ED Course  Procedures (including critical care time) Labs Review Labs Reviewed  CBC WITH DIFFERENTIAL - Abnormal; Notable for the following:    RBC 4.13 (*)    Hemoglobin 12.5 (*)    HCT 37.9 (*)    All other components within normal limits  COMPREHENSIVE METABOLIC PANEL - Abnormal; Notable for the following:    Glucose, Bld 103 (*)    BUN 29 (*)    Creatinine, Ser 1.57 (*)    GFR calc non Af Amer 39 (*)    GFR calc Af Amer 45 (*)    All other components within normal limits  PRO B NATRIURETIC PEPTIDE - Abnormal; Notable for the following:    Pro B Natriuretic peptide (BNP) 3627.0 (*)    All other components within normal limits  I-STAT TROPOININ, ED  Randolm Idol, ED    Imaging Review Dg Chest 2 View  05/25/2013   CLINICAL DATA:  Shortness of Breath  EXAM: CHEST  2 VIEW  COMPARISON:  May 17, 2013 chest radiograph and chest CT  FINDINGS: There is scarring in the left base. There is no edema or consolidation. Heart is upper normal in size. The pulmonary vascularity shows evidence of a degree of pulmonary arterial hypertension. No adenopathy. Aorta is mildly tortuous. No bone lesions.  IMPRESSION: Evidence of pulmonary arterial hypertension. Scarring left base. No edema or consolidation.   Electronically Signed   By: Lowella Grip M.D.   On: 05/25/2013 16:31     Date: 05/25/2013  Rate: 73  Rhythm: normal sinus rhythm and premature ventricular contractions (PVC)  QRS Axis: normal  Intervals: normal  ST/T Wave abnormalities: nonspecific T wave  changes  Conduction Disutrbances:none  Narrative Interpretation:   Old EKG Reviewed: unchanged    MDM   Final diagnoses:  CHF (congestive heart failure)    Patient with a history of recent hospitalization discharged 4 days ago after having a syncopal episode, renal insufficiency, CHF exacerbation who presents today with shortness of breath. His shortness of breath seems to be CHF based on history with orthopnea. He spoke to the cardiology PA earlier this morning" his Lasix. Since that time he has lost 3 pounds and now he feels better. Based on notes the cardiologist gave him appointment for tomorrow morning but the patient was concerned and wanted to be checked sooner. Here he has 1+ edema in bilateral lower extremities and distal bowels but is in no acute distress is lying in a reclined position and speaking in full sentences. He states he feels much better.  We'll check fluid status with BMP, chest x-ray and will ensure normal electrolytes and unchanged creatinine with a CMP. Patient denies any chest pain.   6:10 PM CMP, CBC and trop wnl.  BNP improved to 3,000 from 6,000 and CXR without evidence of fluid.  7:01 PM Spoke with Dr. Wynonia Lawman who feels pt has failed outpt management and needs further diuresis inpt.  Blanchie Dessert,  MD 05/25/13 [place], MD 05/25/13 1902

## 2013-05-25 NOTE — ED Notes (Addendum)
Pt c/o shortness of breath that would occur when he lays down, resolves when he would sit up. Pt had his lasix changed recently. Pt concerned that he would have trouble breathing while sleeping. Pt talking in complete sentences without difficulty. Pt recently had a Medtronic insertable cardiac monitor inserted. Pt has dressing over surgical site midchest. Pt reports area unchanged.

## 2013-05-26 DIAGNOSIS — N179 Acute kidney failure, unspecified: Secondary | ICD-10-CM

## 2013-05-26 DIAGNOSIS — I5043 Acute on chronic combined systolic (congestive) and diastolic (congestive) heart failure: Secondary | ICD-10-CM

## 2013-05-26 DIAGNOSIS — I2510 Atherosclerotic heart disease of native coronary artery without angina pectoris: Secondary | ICD-10-CM

## 2013-05-26 DIAGNOSIS — N189 Chronic kidney disease, unspecified: Secondary | ICD-10-CM

## 2013-05-26 DIAGNOSIS — I359 Nonrheumatic aortic valve disorder, unspecified: Secondary | ICD-10-CM

## 2013-05-26 DIAGNOSIS — I509 Heart failure, unspecified: Secondary | ICD-10-CM

## 2013-05-26 LAB — BASIC METABOLIC PANEL
BUN: 30 mg/dL — AB (ref 6–23)
CALCIUM: 9.8 mg/dL (ref 8.4–10.5)
CHLORIDE: 100 meq/L (ref 96–112)
CO2: 28 mEq/L (ref 19–32)
CREATININE: 1.65 mg/dL — AB (ref 0.50–1.35)
GFR calc Af Amer: 43 mL/min — ABNORMAL LOW (ref >90)
GFR calc non Af Amer: 37 mL/min — ABNORMAL LOW (ref >90)
GLUCOSE: 94 mg/dL (ref 70–99)
Potassium: 3.7 mEq/L (ref 3.7–5.3)
Sodium: 144 mEq/L (ref 137–147)

## 2013-05-26 MED ORDER — FUROSEMIDE 40 MG PO TABS
40.0000 mg | ORAL_TABLET | Freq: Every day | ORAL | Status: DC
  Filled 2013-05-26: qty 1

## 2013-05-26 MED ORDER — FUROSEMIDE 10 MG/ML IJ SOLN
40.0000 mg | Freq: Every day | INTRAMUSCULAR | Status: DC
  Administered 2013-05-27: 40 mg via INTRAVENOUS
  Filled 2013-05-26: qty 4

## 2013-05-26 MED ORDER — FUROSEMIDE 10 MG/ML IJ SOLN: 40.0000 mg | Freq: Two times a day (BID) | INTRAMUSCULAR | Status: DC

## 2013-05-26 MED ORDER — FUROSEMIDE 10 MG/ML IJ SOLN
40.0000 mg | Freq: Once | INTRAMUSCULAR | Status: AC
  Administered 2013-05-26: 40 mg via INTRAVENOUS

## 2013-05-26 NOTE — Evaluation (Signed)
Physical Therapy Evaluation Patient Details Name: Jonathan Acevedo MRN: [id number] DOB: March 13, 1928 Today's Date: 05/26/2013   History of Present Illness  80 year old male has a history of moderate aortic stenosis and a mildly reduced ejection fraction. He has a history of coronary artery disease with previous anterior infarction and has had a previous LAD stent. He had a syncopal episode and had a prior history of orthostasis that has been precipitated by the addition of Flomax to his regimen as well as Vasotec. He was found to have some PVCs. He was hospitalized on the 11th and had catheterization done on the 13th with findings of a patent proximal stent in the LAD, him nonobstructive disease in the left main, circumflex and RCA. EF was estimated at 35-40%. He had an EP study that showed no inducible arrhythmias and had a loop monitor implanted. He was discharged on the 14th with a weight of 199 pounds. Today he had an episode of orthopnea and had some worsening dyspnea as well as edema. He called the PA on call who advised to take an extra furosemide. He then called back concerned because he was continued to have orthopnea despite diuresis and came to the emergency room. His BNP was elevated at 3000 and he had significant edema. He does not have angina at this time. Card apprehensive about returning home because of his recent syncopal episode as well as his worsening shortness of breath and the fact he was worried that he he fell at home that he might not be able to get assisted back up by his wife.  Clinical Impression  Pt very worried about returning home so he can assist his wife.  Pt is mod I with gait but requires close supervision with stairs and with any balance challenges.  PT recommends HHPT for home safety eval as well as balance training.  No further acute PT needs identified at this time.    Follow Up Recommendations Home health PT    Equipment Recommendations  None recommended by PT     Recommendations for Other Services       Precautions / Restrictions Restrictions Weight Bearing Restrictions: No      Mobility  Bed Mobility Overal bed mobility: Modified Independent                Transfers Overall transfer level: Modified independent                  Ambulation/Gait Ambulation/Gait assistance: Modified independent (Device/Increase time) Ambulation Distance (Feet): 200 Feet Assistive device: Rolling walker (2 wheeled)       General Gait Details: pt with no LOB, able to manuever safely with R boot and RW  Stairs Stairs: Yes Stairs assistance: Supervision Stair Management: One rail Left;Forwards;Backwards;With walker Number of Stairs: 3 General stair comments: pt ascends forwards and descends backwards.  Able to perform with mod I with 2 hands on handrail, requirs close supervision and increases risk of falls with negotiating stairs with 1 hand on handrail and 1 hand holding RW.  PT spoke wiht pt and recommends supervision at all times with stair negotiation, pt verbalizes understanding  Wheelchair Mobility    Modified Rankin (Stroke Patients Only)       Balance                                             Pertinent  Vitals/Pain No c/o pain.    Home Living Family/patient expects to be discharged to:: Private residence Living Arrangements: Spouse/significant other Available Help at Discharge: Other (Comment) (pt lives with wife who cannot provide physical assistance, has friends who can help drive him places) Type of Home: House Home Access: Stairs to enter Entrance Stairs-Rails: Left Entrance Stairs-Number of Steps: 2 Home Layout: One level Home Equipment: Mercer - 2 wheels;Cane - single point Additional Comments: pt in R walking boot due to R LE fracture    Prior Function Level of Independence: Independent               Hand Dominance        Extremity/Trunk Assessment               Lower  Extremity Assessment: Generalized weakness      Cervical / Trunk Assessment: Normal  Communication   Communication: No difficulties  Cognition Arousal/Alertness: Awake/alert Behavior During Therapy: WFL for tasks assessed/performed Overall Cognitive Status: Within Functional Limits for tasks assessed                      General Comments      Exercises        Assessment/Plan    PT Assessment All further PT needs can be met in the next venue of care  PT Diagnosis Difficulty walking;Generalized weakness   PT Problem List Decreased balance;Decreased mobility;Decreased safety awareness  PT Treatment Interventions     PT Goals (Current goals can be found in the Care Plan section) Acute Rehab PT Goals PT Goal Formulation: No goals set, d/c therapy    Frequency     Barriers to discharge Inaccessible home environment;Decreased caregiver support pt's wife unable to assist    Co-evaluation               End of Session Equipment Utilized During Treatment: Gait belt Activity Tolerance: Patient tolerated treatment well Patient left: in bed;with call bell/phone within reach (seated on edge of bed) Nurse Communication: Mobility status         Time: 3903-0092 PT Time Calculation (min): 23 min   Charges:   PT Evaluation $Initial PT Evaluation Tier I: 1 Procedure PT Treatments $Gait Training: 8-22 mins   PT G Codes:          [name] 05/26/2013, 10:27 AM

## 2013-05-26 NOTE — Progress Notes (Signed)
UR completed 

## 2013-05-26 NOTE — Progress Notes (Signed)
Patient Name: Jonathan Acevedo Date of Encounter: 05/26/2013  Active Problems:   Acute on chronic systolic congestive heart failure    Patient Profile: 80 yo male w/ mod AS and EF 35-40%, loop recorder for syncope, cath recently w/ non-obs dz and recent CTA negative for PE. Admitted 04/19 w/ SOB  SUBJECTIVE: Breathing much better. Loop implant site darkened w/ some dried blood but not painful, draining.  OBJECTIVE Filed Vitals:   05/26/13 0138 05/26/13 0631 05/26/13 0635 05/26/13 0955  BP: 102/51 112/69  107/51  Pulse: 56 58  54  Temp: 98.2 F (36.8 C) 97.8 F (36.6 C)  97.7 F (36.5 C)  TempSrc: Oral Oral  Oral  Resp: 16 18  18   Height:      Weight:  191 lb 6.4 oz (86.818 kg) 200 lb 3.2 oz (90.81 kg)   SpO2: 95% 94%  94%    Intake/Output Summary (Last 24 hours) at 05/26/13 1007 Last data filed at 05/26/13 1004  Gross per 24 hour  Intake    960 ml  Output   1500 ml  Net   -540 ml   Filed Weights   05/25/13 2126 05/26/13 0631 05/26/13 0635  Weight: 202 lb 13.2 oz (92 kg) 191 lb 6.4 oz (86.818 kg) 200 lb 3.2 oz (90.81 kg)    PHYSICAL EXAM General: Well developed, well nourished, male in no acute distress. Head: Normocephalic, atraumatic.  Neck: Supple without bruits, JVD not elevated. Lungs:  Resp regular and unlabored, slightly decreased BS bases Heart: RRR, S1, S2, no S3, S4, 3/6 murmur; no rub. Abdomen: Soft, non-tender, non-distended, BS + x 4.  Extremities: No clubbing, cyanosis, no edema.  Neuro: Alert and oriented X 3. Moves all extremities spontaneously. Psych: Normal affect.  LABS: CBC: Recent Labs  05/25/13 1500  WBC 7.4  NEUTROABS 4.9  HGB 12.5*  HCT 37.9*  MCV 91.8  PLT 683   Basic Metabolic Panel: Recent Labs  05/25/13 1500 05/26/13 0353  NA 142 144  K 4.3 3.7  CL 100 100  CO2 26 28  GLUCOSE 103* 94  BUN 29* 30*  CREATININE 1.57* 1.65*  CALCIUM 10.0 9.8   Liver Function Tests: Recent Labs  05/25/13 1500  AST 17  ALT 14    ALKPHOS 86  BILITOT 0.5  PROT 7.5  ALBUMIN 3.7    Recent Labs  05/25/13 1531  TROPIPOC 0.00   BNP: Pro B Natriuretic peptide (BNP)  Date/Time Value Ref Range Status  05/25/2013  3:00 PM 3627.0* 0 - 450 pg/mL Final  05/17/2013  7:30 PM 6425.0* 0 - 450 pg/mL Final   No results found for this basename: DDIMER    TELE:        Radiology/Studies: Dg Chest 2 View 05/25/2013   CLINICAL DATA:  Shortness of Breath  EXAM: CHEST  2 VIEW  COMPARISON:  May 17, 2013 chest radiograph and chest CT  FINDINGS: There is scarring in the left base. There is no edema or consolidation. Heart is upper normal in size. The pulmonary vascularity shows evidence of a degree of pulmonary arterial hypertension. No adenopathy. Aorta is mildly tortuous. No bone lesions.  IMPRESSION: Evidence of pulmonary arterial hypertension. Scarring left base. No edema or consolidation.   Electronically Signed   By: Lowella Grip M.D.   On: 05/25/2013 16:31     Current Medications:  . aspirin  325 mg Oral q morning - 10a  . enoxaparin (LOVENOX) injection  40 mg Subcutaneous  QHS  . ezetimibe-simvastatin  1 tablet Oral q1800  . ferrous sulfate  325 mg Oral Daily  . furosemide  40 mg Intravenous BID  . sodium chloride  3 mL Intravenous Q12H      ASSESSMENT AND PLAN: Active Problems:   Acute on chronic systolic congestive heart failure - rec'd Lasix 40 mg last pm, due for some this am, will change to PO and follow, believe volume status is good.     Anticoag - pt on DVT Lovenox and ASA 325 mg.   Plan - Dr. Thurman Coyer note says ck ddimer and LE venous dopplers but not ordered. Pt doing well, MD advise if needed or f/u as OP. Otherwise, will d/c today.   Signed, Lonn Georgia , PA-C 10:07 AM 05/26/2013 Patient seen and examined and history reviewed. Agree with above findings and plan. His breathing is much better today after IV lasix. I think it is very unlikely that this is a PE. Clinical response to diuresis is  more consistent with CHF. Will hold vasotec due to increased creatinine. Give IV lasix 40 mg today. Hopefully can DC tomorrow on oral lasix. Will check BMET in am. Stressed importance of sodium restriction- patient eats out a lot. Needs to monitor weight daily. CHF meds limited by bradycardia and hypotension.  Ander Slade [facility] 05/26/2013 11:10 AM

## 2013-05-26 NOTE — Progress Notes (Signed)
Patient arrived from ED. SR on telemetry. No complaints of SOB. Alert and oriented x 4. Vital signs stable.

## 2013-05-27 DIAGNOSIS — N183 Chronic kidney disease, stage 3 unspecified: Secondary | ICD-10-CM

## 2013-05-27 LAB — BASIC METABOLIC PANEL
BUN: 34 mg/dL — ABNORMAL HIGH (ref 6–23)
CALCIUM: 9.5 mg/dL (ref 8.4–10.5)
CO2: 28 meq/L (ref 19–32)
CREATININE: 1.67 mg/dL — AB (ref 0.50–1.35)
Chloride: 99 mEq/L (ref 96–112)
GFR calc Af Amer: 42 mL/min — ABNORMAL LOW (ref >90)
GFR calc non Af Amer: 36 mL/min — ABNORMAL LOW (ref >90)
Glucose, Bld: 105 mg/dL — ABNORMAL HIGH (ref 70–99)
Potassium: 3.6 mEq/L — ABNORMAL LOW (ref 3.7–5.3)
Sodium: 142 mEq/L (ref 137–147)

## 2013-05-27 MED ORDER — POTASSIUM CHLORIDE CRYS ER 20 MEQ PO TBCR: 20.0000 meq | EXTENDED_RELEASE_TABLET | Freq: Every day | ORAL | Status: DC

## 2013-05-27 MED ORDER — ACETAMINOPHEN 325 MG PO TABS
650.0000 mg | ORAL_TABLET | ORAL | Status: DC | PRN
  Administered 2013-05-27: 650 mg via ORAL
  Filled 2013-05-27: qty 2

## 2013-05-27 MED ORDER — POTASSIUM CHLORIDE CRYS ER 20 MEQ PO TBCR
40.0000 meq | EXTENDED_RELEASE_TABLET | Freq: Once | ORAL | Status: AC
  Administered 2013-05-27: 40 meq via ORAL
  Filled 2013-05-27: qty 2

## 2013-05-27 NOTE — Discharge Summary (Signed)
Patient seen and examined and history reviewed. Agree with above findings and plan. See my earlier rounding note.   Ander Slade JordanMD 05/27/2013 12:46 PM

## 2013-05-27 NOTE — Plan of Care (Signed)
Problem: Phase I Progression Outcomes Goal: EF % per last Echo/documented,Core Reminder form on chart Outcome: Completed/Met Date Met:  05/27/13 EF 40-45% per ECHO performed on 05/18/13.

## 2013-05-27 NOTE — Progress Notes (Signed)
Patient Name: Jonathan Acevedo Date of Encounter: 05/27/2013  Active Problems:   Acute on chronic systolic congestive heart failure    Patient Profile: 80 yo male w/ mod AS and EF 35-40%, loop recorder for syncope, cath recently w/ non-obs dz and recent CTA negative for PE. Admitted 04/19 w/ SOB  SUBJECTIVE: Breathing much better. Loop implant site darkened w/ some dried blood. No redness.   OBJECTIVE Filed Vitals:   05/26/13 1845 05/26/13 2110 05/26/13 2213 05/27/13 0507  BP: 107/47 100/86 111/72 126/85  Pulse: 83 56 58 63  Temp: 97.1 F (36.2 C) 97.8 F (36.6 C)  98 F (36.7 C)  TempSrc: Oral Oral  Oral  Resp: 18 19  18   Height:      Weight:    199 lb 1.2 oz (90.3 kg)  SpO2: 97% 97% 95% 95%    Intake/Output Summary (Last 24 hours) at 05/27/13 0835 Last data filed at 05/27/13 0026  Gross per 24 hour  Intake   1443 ml  Output   1925 ml  Net   -482 ml   Filed Weights   05/26/13 0631 05/26/13 0635 05/27/13 0507  Weight: 191 lb 6.4 oz (86.818 kg) 200 lb 3.2 oz (90.81 kg) 199 lb 1.2 oz (90.3 kg)    PHYSICAL EXAM General: Well developed, well nourished, male in no acute distress. Head: Normocephalic, atraumatic.  Neck: Supple without bruits, JVD not elevated. Lungs:  Resp regular and unlabored, slightly decreased BS bases Heart: RRR, S1, S2, no S3, S4, 3/6 murmur; no rub. Abdomen: Soft, non-tender, non-distended, BS + x 4.  Extremities: No clubbing, cyanosis, no edema.  Neuro: Alert and oriented X 3. Moves all extremities spontaneously. Psych: Normal affect.  LABS: CBC:  Recent Labs  05/25/13 1500  WBC 7.4  NEUTROABS 4.9  HGB 12.5*  HCT 37.9*  MCV 91.8  PLT 536   Basic Metabolic Panel:  Recent Labs  05/26/13 0353 05/27/13 0549  NA 144 142  K 3.7 3.6*  CL 100 99  CO2 28 28  GLUCOSE 94 105*  BUN 30* 34*  CREATININE 1.65* 1.67*  CALCIUM 9.8 9.5   Liver Function Tests:  Recent Labs  05/25/13 1500  AST 17  ALT 14  ALKPHOS 86  BILITOT  0.5  PROT 7.5  ALBUMIN 3.7    Recent Labs  05/25/13 1531  TROPIPOC 0.00   BNP: Pro B Natriuretic peptide (BNP)  Date/Time Value Ref Range Status  05/25/2013  3:00 PM 3627.0* 0 - 450 pg/mL Final  05/17/2013  7:30 PM 6425.0* 0 - 450 pg/mL Final   No results found for this basename: DDIMER    TELE:        Radiology/Studies: Dg Chest 2 View 05/25/2013   CLINICAL DATA:  Shortness of Breath  EXAM: CHEST  2 VIEW  COMPARISON:  May 17, 2013 chest radiograph and chest CT  FINDINGS: There is scarring in the left base. There is no edema or consolidation. Heart is upper normal in size. The pulmonary vascularity shows evidence of a degree of pulmonary arterial hypertension. No adenopathy. Aorta is mildly tortuous. No bone lesions.  IMPRESSION: Evidence of pulmonary arterial hypertension. Scarring left base. No edema or consolidation.   Electronically Signed   By: Lowella Grip M.D.   On: 05/25/2013 16:31     Current Medications:  . aspirin  325 mg Oral q morning - 10a  . enoxaparin (LOVENOX) injection  40 mg Subcutaneous QHS  . ezetimibe-simvastatin  1 tablet Oral q1800  . ferrous sulfate  325 mg Oral Daily  . furosemide  40 mg Intravenous Daily  . sodium chloride  3 mL Intravenous Q12H      ASSESSMENT AND PLAN: Active Problems:   Acute on chronic systolic congestive heart failure - rec'd Lasix 40 mg IV x 2. Good diuresis. Clinically much improved. Weight down one lb. Part of the problem was too much salt in diet since he eats out a lot. Discussed importance of sodium restriction and he plans to make significant changes. Renal function is stable today. Will continue to hold Vasotec. Plan DC today on Lasix 40 mg daily. Will weigh daily. Will arrange home health RN and PT. He has transition of care appointment with Truitt Merle on 06/04/13. Will discuss with EP removing his dressing today prior to DC. Start potassium 20 meq daily.    Standley Brooking [facility] 05/27/2013 8:35  AM

## 2013-05-27 NOTE — Discharge Summary (Signed)
CARDIOLOGY DISCHARGE SUMMARY   Patient ID: Jonathan Acevedo MRN: [id number] DOB/AGE: 1928/10/13 80 y.o.  Admit date: 05/25/2013 Discharge date: 05/27/2013  PCP: Florina Ou, MD Primary Cardiologist: Dr. Martinique  Primary Discharge Diagnosis:    Acute on chronic systolic congestive heart failure - Weight 199 lbs at d/c  Secondary Discharge Diagnosis:    Hypertension   Chronic renal insufficiency, stage III (moderate)   Aortic stenosis   Ischemic cardiomyopathy   Hypokalemia  Procedures:  Two-view chest x-ray  Hospital Course: Jonathan Acevedo is a 80 y.o. male with a history of CAD. He was admitted with syncope on 04/11. Cardiac catheterization did not show obstructive disease and a loop recorder was implanted for an EF of 35-40% with no inducible arrhythmias on EP study. He had eaten out several times since discharge and reported worsening orthopnea, dyspnea on exertion and edema. His symptoms worsened despite taking extra Lasix at home. He came to the hospital and was admitted for further evaluation and treatment.  Intake and output was negative by approximately 1.5 L since admission and with this, his respiratory status improved. His labs were followed closely during his hospital stay his potassium was supplemented as needed. His BUN and creatinine increased slightly with diuresis but he is still stage III chronic kidney disease.  He was a little weak from his recent hospitalizations and a physical therapy consult was called. He was noted to have some balance issues and strength issues. Home health PT was recommended and the patient is agreeable to this. He will also have a home health RN to monitor his heart failure and dietary compliance.   Because of his renal insufficiency, his Vasotec was held. He will remain off this medication at discharge and consideration can be given to restarting it as an outpatient.  By 4/21, Jonathan Acevedo was feeling much better. His oxygen saturation was 95%  on room air. No further inpatient workup was indicated he is considered stable for discharge, to follow up as an outpatient.  Labs:  Lab Results  Component Value Date   WBC 7.4 05/25/2013   HGB 12.5* 05/25/2013   HCT 37.9* 05/25/2013   MCV 91.8 05/25/2013   PLT 217 05/25/2013    Recent Labs Lab 05/25/13 1500  05/27/13 0549  NA 142  < > 142  K 4.3  < > 3.6*  CL 100  < > 99  CO2 26  < > 28  BUN 29*  < > 34*  CREATININE 1.57*  < > 1.67*  CALCIUM 10.0  < > 9.5  PROT 7.5  --   --   BILITOT 0.5  --   --   ALKPHOS 86  --   --   ALT 14  --   --   AST 17  --   --   GLUCOSE 103*  < > 105*  < > = values in this interval not displayed.  Pro B Natriuretic peptide (BNP)  Date/Time Value Ref Range Status  05/25/2013  3:00 PM 3627.0* 0 - 450 pg/mL Final  05/17/2013  7:30 PM 6425.0* 0 - 450 pg/mL Final    Radiology: Dg Chest 2 View 05/25/2013   CLINICAL DATA:  Shortness of Breath  EXAM: CHEST  2 VIEW  COMPARISON:  May 17, 2013 chest radiograph and chest CT  FINDINGS: There is scarring in the left base. There is no edema or consolidation. Heart is upper normal in size. The pulmonary vascularity shows evidence of a  degree of pulmonary arterial hypertension. No adenopathy. Aorta is mildly tortuous. No bone lesions.  IMPRESSION: Evidence of pulmonary arterial hypertension. Scarring left base. No edema or consolidation.   Electronically Signed   By: Lowella Grip M.D.   On: 05/25/2013 16:31   EKG: 25-May-2013 14:31:14   Sinus rhythm with occasional and consecutive Premature ventricular complexes and Fusion complexes Left anterior fascicular block Minimal voltage criteria for LVH, may be normal variant Anteroseptal infarct , age undetermined; likely old Vent. rate 73 BPM PR interval 166 ms QRS duration 102 ms QT/QTc 416/458 ms P-R-T axes 49 -51 55  FOLLOW UP PLANS AND APPOINTMENTS Allergies  Allergen Reactions  . Risedronate Sodium Other (See Comments)    Reaction=unknown       Medication List    STOP taking these medications       VASOTEC 2.5 MG tablet  Generic drug:  enalapril      TAKE these medications       aspirin 325 MG EC tablet  Take 325 mg by mouth every morning.     furosemide 20 MG tablet  Commonly known as:  LASIX  Take 2 tablets (40 mg total) by mouth daily.     HYDROcodone-acetaminophen 5-325 MG per tablet  Commonly known as:  NORCO/VICODIN  Take 1 tablet by mouth every 6 (six) hours as needed for moderate pain.     nitroGLYCERIN 0.4 MG SL tablet  Commonly known as:  NITROSTAT  Place 1 tablet (0.4 mg total) under the tongue every 5 (five) minutes as needed for chest pain.     oyster calcium 500 MG Tabs tablet  Take 500 mg of elemental calcium by mouth 2 (two) times daily.     potassium chloride SA 20 MEQ tablet  Commonly known as:  K-DUR,KLOR-CON  Take 1 tablet (20 mEq total) by mouth daily.     SM IRON 325 (65 FE) MG tablet  Generic drug:  ferrous sulfate  Take 325 mg by mouth daily.     VITAMIN D PO  Take by mouth.     VYTORIN 10-20 MG per tablet  Generic drug:  ezetimibe-simvastatin  Take 1 tablet by mouth daily.        Discharge Orders   Future Appointments Provider Department Dept Phone   06/04/2013 9:00 AM Burtis Junes, NP [place] Office [phone]   Future Orders Complete By Expires   (HEART FAILURE PATIENTS) Call MD:  Anytime you have any of the following symptoms: 1) 3 pound weight gain in 24 hours or 5 pounds in 1 week 2) shortness of breath, with or without a dry hacking cough 3) swelling in the hands, feet or stomach 4) if you have to sleep on extra pillows at night in order to breathe.  As directed    Diet - low sodium heart healthy  As directed    Increase activity slowly  As directed      Follow-up Information   Follow up with Truitt Merle, NP On 06/04/2013. (At 9 AM)    Specialty:  Nurse Practitioner   Contact information:   [place] [place]  [address] [phone]       BRING ALL MEDICATIONS WITH YOU TO FOLLOW UP APPOINTMENTS  Time spent with patient to include physician time: 41 min Signed: Lonn Georgia, PA-C 05/27/2013, 9:45 AM Co-Sign MD

## 2013-05-27 NOTE — Consult Note (Signed)
Heart Failure Navigator Consult Note  Presentation: Jonathan Acevedo- This 80 year old male has a history of moderate aortic stenosis and a mildly reduced ejection fraction. He has a history of coronary artery disease with previous anterior infarction and has had a previous LAD stent. He had a syncopal episode and had a prior history of orthostasis that has been precipitated by the addition of Flomax to his regimen as well as Vasotec. He was found to have some PVCs. He was hospitalized on the 11th and had catheterization done on the 13th with findings of a patent proximal stent in the LAD, him nonobstructive disease in the left main, circumflex and RCA. EF was estimated at 35-40%. He had an EP study that showed no inducible arrhythmias and had a loop monitor implanted. He was discharged on the 14th with a weight of 199 pounds. Today he had an episode of orthopnea and had some worsening dyspnea as well as edema. He called the PA on call who advised to take an extra furosemide. He then called back concerned because he was continued to have orthopnea despite diuresis and came to the emergency room. His BNP was elevated at 3000 and he had significant edema. He does not have angina at this time. Card apprehensive about returning home because of his recent syncopal episode as well as his worsening shortness of breath and the fact he was worried that he he fell at home that he might not be able to get assisted back up by his wife.   Past Medical History  Diagnosis Date  . Coronary artery disease     w remote anterior myocardial infarction (1998)  . Hypertension   . Renal insufficiency   . Aortic stenosis   . Rectal polyp   . Hypercholesteremia   . Kidney stones   . 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 Hypercalcemia 12/26/2011  . Afib   . Syncope 05/18/2013    History   Social History  . Marital Status: Married    Spouse Name: N/A    Number of Children: N/A  . Years of Education: N/A   Occupational  History  . [employer]     retired   Social History Main Topics  . Smoking status: Former Smoker -- 1.00 packs/day for 45 years    Types: Cigarettes    Quit date: 07/19/1996  . Smokeless tobacco: Never Used  . Alcohol Use: No  . Drug Use: No  . Sexual Activity: None   Other Topics Concern  . None   Social History Narrative   Lives with wife, generally exercises regularly.    ECHO:Study Conclusions-05/18/13  - Left ventricle: The cavity size was mildly dilated. There was moderate concentric hypertrophy. Systolic function was mildly to moderately reduced. The estimated ejection fraction was in the range of 40% to 45%. There is akinesis of the mid anteroseptal, distal septal and inferior wall and true apex. Doppler parameters are consistent with abnormal left ventricular relaxation (grade 1 diastolic dysfunction). Doppler parameters are consistent with elevated ventricular end-diastolic filling pressure. - Aortic valve: Cusp separation was mildly reduced. There was moderate stenosis. Valve area: 1.25cm^2(VTI). Valve area: 1.1cm^2 (Vmax). - Mitral valve: Calcified annulus. Mildly thickened leaflets . Transvalvular velocity was within the normal range. There was no evidence for stenosis. No regurgitation. - Left atrium: The atrium was severely dilated. - Right ventricle: The cavity size was mildly dilated. Wall thickness was normal. Systolic function was normal. - Right atrium: The atrium was moderately dilated. - Tricuspid valve: Mild  regurgitation. - Pulmonary arteries: Systolic pressure was within the normal range. - Inferior vena cava: The vessel was dilated; the respirophasic diameter changes were blunted (< 50%); findings are consistent with elevated central venous pressure. - Impressions: When compared to the prior study from 2014 left ventricle is now midly dilated with elevated filling pressures. LVEF is the same estimated at 40-45%. Aortic stenosis is unchanged  estimated as moderate with mean gradient 24 mmHg. Impressions:  - When compared to the prior study from 2014 left ventricle is now midly dilated with elevated filling pressures. LVEF is the same estimated at 40-45%. Aortic stenosis is unchanged estimated as moderate with mean gradient 24 mmHg.   BNP    Component Value Date/Time   PROBNP 3627.0* 05/25/2013 1500    Education Assessment and Provision:  Detailed education and instructions provided on heart failure disease management including the following:  Signs and symptoms of Heart Failure When to call the physician Importance of daily weights Low sodium diet  Fluid restriction Medication management Anticipated future follow-up appointments  Patient education given on each of the above topics.  Patient acknowledges understanding and acceptance of all instructions.  Patient has received prior education and was able to teach back most topics above.  I also gave him additional information regarding a low sodium diet as he says "he eats too much salt".  He seems motivated and does not want to "go through this again".  He admits to not currently weighing daily.  I have encouraged him to begin weights each day and to record.    Education Materials:  "Living Better With Heart Failure" Booklet, Daily Weight Tracker Tool and Heart Failure Educational Video.   High Risk Criteria for Readmission and/or Poor Patient Outcomes:   EF <30%- No-40-45%  2 or more admissions in 6 months- Yes  Difficult social situation- No--wife at home (hurt her hip)  Demonstrates medication noncompliance- No   Barriers of Care:  Ability/desire to be compliant, Knowledge of medical conditions and recommendations for HF  Discharge Planning:  Home to wife

## 2013-05-27 NOTE — Telephone Encounter (Signed)
LMOVM w/ my direct #. 

## 2013-05-28 ENCOUNTER — Ambulatory Visit: Payer: Medicare Other

## 2013-05-28 ENCOUNTER — Other Ambulatory Visit: Payer: Self-pay

## 2013-05-28 MED ORDER — FUROSEMIDE 20 MG PO TABS: 40.0000 mg | ORAL_TABLET | Freq: Every day | ORAL | Status: DC

## 2013-06-04 ENCOUNTER — Encounter: Payer: Self-pay | Admitting: Nurse Practitioner

## 2013-06-04 ENCOUNTER — Ambulatory Visit (INDEPENDENT_AMBULATORY_CARE_PROVIDER_SITE_OTHER): Payer: Medicare Other | Admitting: Nurse Practitioner

## 2013-06-04 VITALS — BP 110/68 | HR 56 | Ht 70.0 in | Wt 199.8 lb

## 2013-06-04 DIAGNOSIS — R0609 Other forms of dyspnea: Secondary | ICD-10-CM

## 2013-06-04 DIAGNOSIS — R55 Syncope and collapse: Secondary | ICD-10-CM

## 2013-06-04 DIAGNOSIS — I2510 Atherosclerotic heart disease of native coronary artery without angina pectoris: Secondary | ICD-10-CM

## 2013-06-04 DIAGNOSIS — R0989 Other specified symptoms and signs involving the circulatory and respiratory systems: Secondary | ICD-10-CM

## 2013-06-04 DIAGNOSIS — I5022 Chronic systolic (congestive) heart failure: Secondary | ICD-10-CM

## 2013-06-04 DIAGNOSIS — R0600 Dyspnea, unspecified: Secondary | ICD-10-CM

## 2013-06-04 LAB — BASIC METABOLIC PANEL
BUN: 35 mg/dL — ABNORMAL HIGH (ref 6–23)
CO2: 28 mEq/L (ref 19–32)
Calcium: 9.7 mg/dL (ref 8.4–10.5)
Chloride: 103 mEq/L (ref 96–112)
Creatinine, Ser: 1.8 mg/dL — ABNORMAL HIGH (ref 0.4–1.5)
GFR: 39.48 mL/min — ABNORMAL LOW (ref >60.00)
Glucose, Bld: 89 mg/dL (ref 70–99)
Potassium: 4.7 mEq/L (ref 3.5–5.1)
Sodium: 138 mEq/L (ref 135–145)

## 2013-06-04 LAB — BRAIN NATRIURETIC PEPTIDE: Pro B Natriuretic peptide (BNP): 220 pg/mL — ABNORMAL HIGH (ref 0.0–100.0)

## 2013-06-04 NOTE — Progress Notes (Signed)
[name] Date of Birth: 1928-03-13 Medical Record #[id number]  History of Present Illness: Mr. Knoedler is seen back today for a post hospital visit. Seen for Dr. Martinique. He has a history of CAD with remote MI and prior stent to the LAD, HTN, CKD, AS and HLD. He was hospitalized briefly in early April of 2014 with chest pain. A subsequent stress Myoview study showed extensive scar of the anterior apex and inferior wall. Ejection fraction is 44%. This actually represented an improvement from echocardiogram in 2012 at which time his ejection fraction was 35-40%. In Ocober 2014 he presented with a prolonged episode of chest pain. Echo showed no change in his AS - medical management was continued. Cardiac enzymes were negative.   Last seen here in February.   Most recently admitted twice this month with syncope that resulted in a fractured right ankle and acute systolic HF - was diuresed. He has been cathed and had loop recorder placed. Had had significant sodium indiscretion prior to this admission.   Comes back today. Here with his wife. He says he is "doing great". Feels "wonderful". No chest pain. Not short of breath. Weight is stable. He and his wife eat out most meals - gets way too much salt. He is aware. Off of his low dose enalapril due to his CKD - recheck lab today. He is asking about restarting. BP ok at home - a little soft at times. Has questions about his loop. No more episodes of syncope. Notes that the day he passed out he had taken a dose of Flomax. No longer taking this.   Current Outpatient Prescriptions  Medication Sig Dispense Refill  . aspirin 325 MG EC tablet Take 325 mg by mouth every morning.       . Cholecalciferol (VITAMIN D PO) Take by mouth.      . ezetimibe-simvastatin (VYTORIN) 10-20 MG per tablet Take 1 tablet by mouth daily.  30 tablet  6  . ferrous sulfate (SM IRON) 325 (65 FE) MG tablet Take 325 mg by mouth daily.      . furosemide (LASIX) 20 MG tablet Take 2  tablets (40 mg total) by mouth daily.  60 tablet  6  . HYDROcodone-acetaminophen (NORCO/VICODIN) 5-325 MG per tablet Take 1 tablet by mouth every 6 (six) hours as needed for moderate pain.  20 tablet  0  . nitroGLYCERIN (NITROSTAT) 0.4 MG SL tablet Place 1 tablet (0.4 mg total) under the tongue every 5 (five) minutes as needed for chest pain.  25 tablet  3  . Oyster Shell (OYSTER CALCIUM) 500 MG TABS Take 500 mg of elemental calcium by mouth 2 (two) times daily.      . potassium chloride SA (K-DUR,KLOR-CON) 20 MEQ tablet Take 1 tablet (20 mEq total) by mouth daily.  30 tablet  11   No current facility-administered medications for this visit.    Allergies  Allergen Reactions  . Ibandronic Acid Other (See Comments)    Muscle Aches-pt denies  . Risedronate Other (See Comments)    unknown  . Risedronate Sodium Other (See Comments)    Reaction=unknown    Past Medical History  Diagnosis Date  . Coronary artery disease     w remote anterior myocardial infarction (1998)  . Hypertension   . Renal insufficiency   . Aortic stenosis   . Rectal polyp   . Hypercholesteremia   . Kidney stones   . 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 Hypercalcemia 12/26/2011  .  Afib   . Syncope 05/18/2013    Past Surgical History  Procedure Laterality Date  . Cardiac catheterization  08/18/2008    LMain 20, LAD stent 20 ISR, CFX 10, RCA 20, EF 45  . Umbilical hernia repair    . Inguinal hernia repair      left  . Orif femur fracture      right  . Cataract extraction    . Coronary stent placement  07/19/1996    Proximal LAD  . Parathyroidectomy    . Loop recorder implant  05-19-13    MDT LinQ implanted by Dr Lovena Le for syncope following negative EPS  . Electrophysiology study  05-19-13    EPS for syncope without inducible arrhythmias by Dr Lovena Le    History  Smoking status  . Former Smoker -- 1.00 packs/day for 45 years  . Types: Cigarettes  . Quit date: 07/19/1996  Smokeless tobacco  . Never  Used    History  Alcohol Use No    History reviewed. No pertinent family history.  Review of Systems: The review of systems is per the HPI.  All other systems were reviewed and are negative.  Physical Exam: BP 110/68  Pulse 56  Ht 5\' 10"  (1.778 m)  Wt 199 lb 12.8 oz (90.629 kg)  BMI 28.67 kg/m2 Patient is very pleasant and in no acute distress. Skin is warm and dry. Color is normal.  HEENT is unremarkable. Normocephalic/atraumatic. PERRL. Sclera are nonicteric. Neck is supple. No masses. No JVD. Lungs are clear. Cardiac exam shows a regular rate and rhythm. Harsh outflow murmur. Loop noted in the midsternal chest - looks ok.  Abdomen is soft. Extremities are without edema. He has a boot on the right lower leg. Gait and ROM are intact. No gross neurologic deficits noted.  Wt Readings from Last 3 Encounters:  06/04/13 199 lb 12.8 oz (90.629 kg)  05/27/13 199 lb 1.2 oz (90.3 kg)  05/20/13 199 lb 11.8 oz (90.6 kg)    LABORATORY DATA: Lab Results  Component Value Date   WBC 7.4 05/25/2013   HGB 12.5* 05/25/2013   HCT 37.9* 05/25/2013   PLT 217 05/25/2013   GLUCOSE 105* 05/27/2013   CHOL 133 11/17/2012   TRIG 105 11/17/2012   HDL 54 11/17/2012   LDLCALC 58 11/17/2012   ALT 14 05/25/2013   AST 17 05/25/2013   NA 142 05/27/2013   K 3.6* 05/27/2013   CL 99 05/27/2013   CREATININE 1.67* 05/27/2013   BUN 34* 05/27/2013   CO2 28 05/27/2013   TSH 1.453 Test methodology is 3rd generation TSH 08/17/2008   INR 1.21 05/19/2013   HGBA1C 6.2* 11/16/2012   Echo Study Conclusions  - Left ventricle: The cavity size was mildly dilated. There was moderate concentric hypertrophy. Systolic function was mildly to moderately reduced. The estimated ejection fraction was in the range of 40% to 45%. There is akinesis of the mid anteroseptal, distal septal and inferior wall and true apex. Doppler parameters are consistent with abnormal left ventricular relaxation (grade 1 diastolic dysfunction). Doppler  parameters are consistent with elevated ventricular end-diastolic filling pressure. - Aortic valve: Cusp separation was mildly reduced. There was moderate stenosis. Valve area: 1.25cm^2(VTI). Valve area: 1.1cm^2 (Vmax). - Mitral valve: Calcified annulus. Mildly thickened leaflets . Transvalvular velocity was within the normal range. There was no evidence for stenosis. No regurgitation. - Left atrium: The atrium was severely dilated. - Right ventricle: The cavity size was mildly dilated. Wall thickness was normal. Systolic  function was normal. - Right atrium: The atrium was moderately dilated. - Tricuspid valve: Mild regurgitation. - Pulmonary arteries: Systolic pressure was within the normal range. - Inferior vena cava: The vessel was dilated; the respirophasic diameter changes were blunted (< 50%); findings are consistent with elevated central venous pressure. - Impressions: When compared to the prior study from 2014 left ventricle is now midly dilated with elevated filling pressures. LVEF is the same estimated at 40-45%. Aortic stenosis is unchanged estimated as moderate with mean gradient 24 mmHg. Impressions:  - When compared to the prior study from 2014 left ventricle is now midly dilated with elevated filling pressures. LVEF is the same estimated at 40-45%. Aortic stenosis is unchanged estimated as moderate with mean gradient 24 mmHg.  Cardiac Catheterization Procedure Note  Name: JAXX HUISH  MRN: [id number]  DOB: [birth year]  Procedure: Left Heart Cath, Selective Coronary Angiography, LV angiography  Indication: 80 yo WM with history of remote anterior MI and stenting of the LAD in 1998 presents with CHF, syncope, and PVCs  Procedural Details: The right wrist was prepped, draped, and anesthetized with 1% lidocaine. Using the modified Seldinger technique, a 5 French sheath was introduced into the right radial artery. 3 mg of verapamil was administered through the sheath,  weight-based unfractionated heparin was administered intravenously. Standard Judkins catheters were used for selective coronary angiography and left ventriculography. Catheter exchanges were performed over an exchange length guidewire. There were no immediate procedural complications. A TR band was used for radial hemostasis at the completion of the procedure. The patient was transferred to the post catheterization recovery area for further monitoring.  Procedural Findings:  Hemodynamics:  AO 141/78 mean 107 mm Hg  LV 157/28 mm Hg  AV gradient of 19 mm Hg  Coronary angiography:  Coronary dominance: right  Left mainstem: 20% ostial disease  Left anterior descending (LAD): There is a segment of stents in the proximal LAD that is widely patent. No obstructive disease.  Left circumflex (LCx): Large vessel with 20% disease in the proximal vessel.  Right coronary artery (RCA): Moderately calcified with 20% disease in the proximal and mid vessel.  Left ventriculography: Left ventricular systolic function is abnormal, there is akinesis of the mid to distal anterior wall and entire apex. LVEF is estimated at 35-40%, there is no significant mitral regurgitation  Final Conclusions:  1. Nonobstructive CAD  2. Moderate to severe LV dysfunction.  3. Mild to moderate aortic stenosis by gradient.  4. Elevated LVEDP.  Recommendations: proceed with EP study. Needs more diuresis.  Ander Slade [facility]  05/19/2013, 4:28 PM    Assessment / Plan: 1. Acute on chronic systolic heart failure - most likely does get too much salt due to his constant eating out. Discussed at length. Hope to get him back on low dose ACE if lab ok. I have asked him to continue to weigh daily. Stay on same medicines for now.   2. CAD - recent cath - manage medically.   3. Syncope - has loop recorder in place - does have questions about his home monitoring device - these were addressed by the EP clinic while here - so far his device has  had no significant findings noted.   4. CKD - recheck his lab today  See back in a month.   Patient is agreeable to this plan and will call if any problems develop in the interim.   Burtis Junes, RN, [facility] [address]  [place], [place]  [id number] [phone]

## 2013-06-04 NOTE — Patient Instructions (Signed)
We will check lab here today  Stay on your current medicines  Try to restrict your salt as best you can  Continue to weigh each day and monitor your blood pressure  I will see you in a month  Call the [place] office at [phone] if you have any questions, problems or concerns.

## 2013-06-05 ENCOUNTER — Telehealth: Payer: Self-pay | Admitting: Cardiology

## 2013-06-05 NOTE — Telephone Encounter (Signed)
New message     How do you lower your BNP.  His results was 220.

## 2013-06-05 NOTE — Telephone Encounter (Signed)
Returned call to patient advised BNP much improved.Advised to keep appointment with Truitt Merle NP 07/02/13 at 10:00 am.

## 2013-06-18 ENCOUNTER — Ambulatory Visit (INDEPENDENT_AMBULATORY_CARE_PROVIDER_SITE_OTHER): Payer: Medicare Other | Admitting: *Deleted

## 2013-06-18 DIAGNOSIS — R55 Syncope and collapse: Secondary | ICD-10-CM

## 2013-06-24 ENCOUNTER — Other Ambulatory Visit: Payer: Self-pay | Admitting: *Deleted

## 2013-06-24 ENCOUNTER — Encounter: Payer: Self-pay | Admitting: Internal Medicine

## 2013-06-24 MED ORDER — FUROSEMIDE 20 MG PO TABS: 40.0000 mg | ORAL_TABLET | Freq: Every day | ORAL | Status: DC

## 2013-07-02 ENCOUNTER — Encounter: Payer: Self-pay | Admitting: Nurse Practitioner

## 2013-07-02 ENCOUNTER — Ambulatory Visit (INDEPENDENT_AMBULATORY_CARE_PROVIDER_SITE_OTHER): Payer: Medicare Other | Admitting: Nurse Practitioner

## 2013-07-02 VITALS — BP 130/80 | HR 55 | Ht 70.0 in | Wt 201.4 lb

## 2013-07-02 DIAGNOSIS — I359 Nonrheumatic aortic valve disorder, unspecified: Secondary | ICD-10-CM

## 2013-07-02 DIAGNOSIS — I5022 Chronic systolic (congestive) heart failure: Secondary | ICD-10-CM

## 2013-07-02 DIAGNOSIS — R55 Syncope and collapse: Secondary | ICD-10-CM

## 2013-07-02 DIAGNOSIS — I350 Nonrheumatic aortic (valve) stenosis: Secondary | ICD-10-CM

## 2013-07-02 DIAGNOSIS — I10 Essential (primary) hypertension: Secondary | ICD-10-CM

## 2013-07-02 LAB — BASIC METABOLIC PANEL
BUN: 30 mg/dL — ABNORMAL HIGH (ref 6–23)
CO2: 26 mEq/L (ref 19–32)
Calcium: 9.5 mg/dL (ref 8.4–10.5)
Chloride: 104 mEq/L (ref 96–112)
Creatinine, Ser: 1.6 mg/dL — ABNORMAL HIGH (ref 0.4–1.5)
GFR: 44.39 mL/min — ABNORMAL LOW (ref >60.00)
Glucose, Bld: 87 mg/dL (ref 70–99)
Potassium: 4.1 mEq/L (ref 3.5–5.1)
Sodium: 140 mEq/L (ref 135–145)

## 2013-07-02 NOTE — Progress Notes (Signed)
[name] Date of Birth: Apr 14, 1928 Medical Record #[id number]  History of Present Illness: [name] is seen back today for a one month visit. Seen for Dr. Martinique. He has a history of CAD with remote MI and prior stent to the LAD, HTN, CKD, AS and HLD. He was hospitalized briefly in early April of 2014 with chest pain. A subsequent stress Myoview study showed extensive scar of the anterior apex and inferior wall. Ejection fraction is 44%. This actually represented an improvement from echocardiogram in 2012 at which time his ejection fraction was 35-40%. In Ocober 2014 he presented with a prolonged episode of chest pain. Echo showed no change in his AS - medical management was continued.   Last seen here in February of 2015.   Most recently admitted twice back in April with syncope that resulted in a fractured right ankle and acute systolic HF - was diuresed. He has been cathed and had loop recorder placed. Had had significant sodium indiscretion prior to this admission and had taken a dose of Flomax on his own. Off of the ACE due to CKD. When seen back last month, he was doing ok.   Comes back today. Here alone today. Doing well. No chest pain. Not short of breath. Restricting his salt. Not dizzy or lightheaded. No passing out spells. Little irritated about his insurance company wanting to send out a nurse, set up scales, etc. Remains off of his Enalapril. BP list at home is great. He is planning on returning to the pool in about another week or so. No problems with his ankle.   Current Outpatient Prescriptions  Medication Sig Dispense Refill  . aspirin 325 MG EC tablet Take 325 mg by mouth every morning.       . Cholecalciferol (VITAMIN D PO) Take by mouth.      . ezetimibe-simvastatin (VYTORIN) 10-20 MG per tablet Take 1 tablet by mouth daily.  30 tablet  6  . ferrous sulfate (SM IRON) 325 (65 FE) MG tablet Take 325 mg by mouth daily.      . furosemide (LASIX) 20 MG tablet Take 2 tablets  (40 mg total) by mouth daily.  60 tablet  0  . HYDROcodone-acetaminophen (NORCO/VICODIN) 5-325 MG per tablet Take 1 tablet by mouth every 6 (six) hours as needed for moderate pain.  20 tablet  0  . nitroGLYCERIN (NITROSTAT) 0.4 MG SL tablet Place 1 tablet (0.4 mg total) under the tongue every 5 (five) minutes as needed for chest pain.  25 tablet  3  . Oyster Shell (OYSTER CALCIUM) 500 MG TABS Take 500 mg of elemental calcium by mouth daily.       . potassium chloride SA (K-DUR,KLOR-CON) 20 MEQ tablet Take 1 tablet (20 mEq total) by mouth daily.  30 tablet  11   No current facility-administered medications for this visit.    Allergies  Allergen Reactions  . Ibandronic Acid Other (See Comments)    Muscle Aches-pt denies  . Risedronate Other (See Comments)    unknown  . Risedronate Sodium Other (See Comments)    Reaction=unknown    Past Medical History  Diagnosis Date  . Coronary artery disease     w remote anterior myocardial infarction (1998)  . Hypertension   . Renal insufficiency   . Aortic stenosis   . Rectal polyp   . Hypercholesteremia   . Kidney stones   . 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 Hypercalcemia 12/26/2011  . Afib   .  Syncope 05/18/2013    Past Surgical History  Procedure Laterality Date  . Cardiac catheterization  08/18/2008    LMain 20, LAD stent 20 ISR, CFX 10, RCA 20, EF 45  . Umbilical hernia repair    . Inguinal hernia repair      left  . Orif femur fracture      right  . Cataract extraction    . Coronary stent placement  07/19/1996    Proximal LAD  . Parathyroidectomy    . Loop recorder implant  05-19-13    MDT LinQ implanted by Dr Lovena Le for syncope following negative EPS  . Electrophysiology study  05-19-13    EPS for syncope without inducible arrhythmias by Dr Lovena Le    History  Smoking status  . Former Smoker -- 1.00 packs/day for 45 years  . Types: Cigarettes  . Quit date: 07/19/1996  Smokeless tobacco  . Never Used    History    Alcohol Use No    History reviewed. No pertinent family history.  Review of Systems: The review of systems is per the HPI.  All other systems were reviewed and are negative.  Physical Exam: BP 130/80  Pulse 55  Ht 5\' 10"  (1.778 m)  Wt 201 lb 6.4 oz (91.354 kg)  BMI 28.90 kg/m2  SpO2 95% Patient is very pleasant and in no acute distress. Skin is warm and dry. Color is normal.  HEENT is unremarkable. Normocephalic/atraumatic. PERRL. Sclera are nonicteric. Neck is supple. No masses. No JVD. Lungs are clear. Cardiac exam shows a regular rate and rhythm. Harsh outflow murmur noted. Abdomen is soft. Extremities are without edema. Gait and ROM are intact. No gross neurologic deficits noted.  Wt Readings from Last 3 Encounters:  07/02/13 201 lb 6.4 oz (91.354 kg)  06/04/13 199 lb 12.8 oz (90.629 kg)  05/27/13 199 lb 1.2 oz (90.3 kg)     LABORATORY DATA: BMET pending  Lab Results  Component Value Date   WBC 7.4 05/25/2013   HGB 12.5* 05/25/2013   HCT 37.9* 05/25/2013   PLT 217 05/25/2013   GLUCOSE 89 06/04/2013   CHOL 133 11/17/2012   TRIG 105 11/17/2012   HDL 54 11/17/2012   LDLCALC 58 11/17/2012   ALT 14 05/25/2013   AST 17 05/25/2013   NA 138 06/04/2013   K 4.7 06/04/2013   CL 103 06/04/2013   CREATININE 1.8* 06/04/2013   BUN 35* 06/04/2013   CO2 28 06/04/2013   TSH 1.453 Test methodology is 3rd generation TSH 08/17/2008   INR 1.21 05/19/2013   HGBA1C 6.2* 11/16/2012    Echo Study Conclusions  - Left ventricle: The cavity size was mildly dilated. There was moderate concentric hypertrophy. Systolic function was mildly to moderately reduced. The estimated ejection fraction was in the range of 40% to 45%. There is akinesis of the mid anteroseptal, distal septal and inferior wall and true apex. Doppler parameters are consistent with abnormal left ventricular relaxation (grade 1 diastolic dysfunction). Doppler parameters are consistent with elevated ventricular end-diastolic  filling pressure. - Aortic valve: Cusp separation was mildly reduced. There was moderate stenosis. Valve area: 1.25cm^2(VTI). Valve area: 1.1cm^2 (Vmax). - Mitral valve: Calcified annulus. Mildly thickened leaflets . Transvalvular velocity was within the normal range. There was no evidence for stenosis. No regurgitation. - Left atrium: The atrium was severely dilated. - Right ventricle: The cavity size was mildly dilated. Wall thickness was normal. Systolic function was normal. - Right atrium: The atrium was moderately dilated. - Tricuspid  valve: Mild regurgitation. - Pulmonary arteries: Systolic pressure was within the normal range. - Inferior vena cava: The vessel was dilated; the respirophasic diameter changes were blunted (< 50%); findings are consistent with elevated central venous pressure. - Impressions: When compared to the prior study from 2014 left ventricle is now midly dilated with elevated filling pressures. LVEF is the same estimated at 40-45%. Aortic stenosis is unchanged estimated as moderate with mean gradient 24 mmHg. Impressions:  - When compared to the prior study from 2014 left ventricle is now midly dilated with elevated filling pressures. LVEF is the same estimated at 40-45%. Aortic stenosis is unchanged estimated as moderate with mean gradient 24 mmHg.   Cardiac Catheterization Procedure Note  Name: JOAL EAKLE  MRN: [id number]  DOB: May 21, 1928  Procedure: Left Heart Cath, Selective Coronary Angiography, LV angiography  Indication: 80 yo WM with history of remote anterior MI and stenting of the LAD in 1998 presents with CHF, syncope, and PVCs  Procedural Details: The right wrist was prepped, draped, and anesthetized with 1% lidocaine. Using the modified Seldinger technique, a 5 French sheath was introduced into the right radial artery. 3 mg of verapamil was administered through the sheath, weight-based unfractionated heparin was administered  intravenously. Standard Judkins catheters were used for selective coronary angiography and left ventriculography. Catheter exchanges were performed over an exchange length guidewire. There were no immediate procedural complications. A TR band was used for radial hemostasis at the completion of the procedure. The patient was transferred to the post catheterization recovery area for further monitoring.  Procedural Findings:  Hemodynamics:  AO 141/78 mean 107 mm Hg  LV 157/28 mm Hg  AV gradient of 19 mm Hg  Coronary angiography:  Coronary dominance: right  Left mainstem: 20% ostial disease  Left anterior descending (LAD): There is a segment of stents in the proximal LAD that is widely patent. No obstructive disease.  Left circumflex (LCx): Large vessel with 20% disease in the proximal vessel.  Right coronary artery (RCA): Moderately calcified with 20% disease in the proximal and mid vessel.  Left ventriculography: Left ventricular systolic function is abnormal, there is akinesis of the mid to distal anterior wall and entire apex. LVEF is estimated at 35-40%, there is no significant mitral regurgitation  Final Conclusions:  1. Nonobstructive CAD  2. Moderate to severe LV dysfunction.  3. Mild to moderate aortic stenosis by gradient.  4. Elevated LVEDP.  Recommendations: proceed with EP study. Needs more diuresis.  Ander Slade [facility]  05/19/2013, 4:28 PM    Assessment / Plan:  1. Chronic systolic HF - looks good clinically. Seems to be doing a better job with restricting his salt. See back in 3 months.  2. CAD - recent cath - manage medically. No active symptoms.   3. Syncope - has loop recorder in place - no recurrent episodes.   4. CKD - recheck his lab today - remains off of his ACE  5. AS - no cardinal symptoms.   Patient is agreeable to this plan and will call if any problems develop in the interim.   Burtis Junes, RN, [place]  [address] [place]  [place], [place] [address]  [phone]

## 2013-07-02 NOTE — Patient Instructions (Addendum)
Keep restricting your salt  Stay on your current medicines but stay off the Enalapril  Keep checking your BP at home  See Dr. Martinique in 3 months  We will recheck your kidney function today  Call the [place] office at [phone] if you have any questions, problems or concerns.

## 2013-07-03 ENCOUNTER — Telehealth: Payer: Self-pay | Admitting: Cardiology

## 2013-07-03 NOTE — Telephone Encounter (Signed)
New message ° ° ° ° °Want lab results °

## 2013-07-03 NOTE — Telephone Encounter (Signed)
F/u ° ° °Pt returning your call °

## 2013-07-03 NOTE — Telephone Encounter (Signed)
Returned call to patient lab results given. 

## 2013-07-03 NOTE — Telephone Encounter (Signed)
Returned call to patient no answer.LMTC. 

## 2013-07-07 ENCOUNTER — Encounter: Payer: Self-pay | Admitting: Internal Medicine

## 2013-07-07 ENCOUNTER — Ambulatory Visit (INDEPENDENT_AMBULATORY_CARE_PROVIDER_SITE_OTHER): Payer: Medicare Other | Admitting: Internal Medicine

## 2013-07-07 VITALS — BP 134/68 | HR 55 | Temp 97.5°F | Resp 16 | Wt 203.0 lb

## 2013-07-07 DIAGNOSIS — R21 Rash and other nonspecific skin eruption: Secondary | ICD-10-CM

## 2013-07-07 DIAGNOSIS — L723 Sebaceous cyst: Secondary | ICD-10-CM

## 2013-07-07 MED ORDER — TRAMADOL HCL 50 MG PO TABS: 50.0000 mg | ORAL_TABLET | Freq: Three times a day (TID) | ORAL | Status: DC | PRN

## 2013-07-07 MED ORDER — CEPHALEXIN 500 MG PO CAPS: 500.0000 mg | ORAL_CAPSULE | Freq: Four times a day (QID) | ORAL | Status: DC

## 2013-07-07 NOTE — Progress Notes (Signed)
   Subjective:    Patient ID: Jonathan Acevedo, male    DOB: 09/20/1928, 80 y.o.   MRN: [id number]  HPI CC: tender lesion on the anterior chest wall at the sternal notch. HPI; noted itchy sore tender lesion on the anterior chest  7 days ago.  It will not drain and it is tender.  No fever no, other lesions.  Pt is requesting to haves it drained. Noi history of skin cancer. No other lesions of the skin in the past.    Review of Systems  Skin: Positive for wound.       Cyst on the anterior chest wall.   All other systems reviewed and are negative.      Objective:   Physical Exam  Constitutional: He is oriented to person, place, and time. He appears well-developed and well-nourished.  HENT:  Head: Normocephalic and atraumatic.  Right Ear: External ear normal.  Left Ear: External ear normal.  Nose: Nose normal.  Eyes: Conjunctivae and EOM are normal. Pupils are equal, round, and reactive to light.  Neck: Normal range of motion. Neck supple.  Cardiovascular: Normal rate and regular rhythm.   Pulmonary/Chest: Effort normal.  Abdominal: Soft.  Musculoskeletal: Normal range of motion.  Lymphadenopathy:    He has no cervical adenopathy.  Neurological: He is alert and oriented to person, place, and time.  Skin: Skin is warm and dry.  .5 cm firm nodular flesh colored lesion on the anterior chest wall at the sternal notch.   Psychiatric: He has a normal mood and affect. His behavior is normal. Judgment and thought content normal.          Assessment & Plan:  I and d of the lesion which I suspect is a sebaceous cyst .. Keflex as directed. ULTRAM if needed for pain. On I and d the lesion did not drain so a shave biopsy was done and hte lesion was sent to pathology for further evaluation.

## 2013-07-07 NOTE — Patient Instructions (Signed)
Apply warm moist compresses daily. Keep clean and dry. Take the antibiotic keflex as directed. Ultram if needed fo  pain. Follow up in the office as directed.Any redness increased pain or drainage return to the office.

## 2013-07-07 NOTE — Progress Notes (Signed)
Patient ID: OTILIO GROLEAU MRN: [id number], DOB: 05/28/1928, 80 y.o. Date of Encounter: 07/07/2013, 8:27 PM    PROCEDURE NOTE: Verbal consent obtained. Local anesthesia obtained with 2% lidocaine plain.   0.5 cm incision made with 11 blade along lesion.  No sebaceous material or purulence expressed.  Shave biopsy performed Hemostasis obtained using silver nitrate. Sol Blazing. Wound care instructions including precautions with patient. Patient tolerated the procedure well. Nodule sent for pathology.  Angela Nevin, PA-C 07/07/2013 8:27 PM

## 2013-07-07 NOTE — Addendum Note (Signed)
Addended by: Boris Lown on: 07/07/2013 08:28 PM   Modules accepted: Orders

## 2013-07-10 ENCOUNTER — Telehealth: Payer: Self-pay | Admitting: Physician Assistant

## 2013-07-10 DIAGNOSIS — C4492 Squamous cell carcinoma of skin, unspecified: Secondary | ICD-10-CM

## 2013-07-10 NOTE — Telephone Encounter (Signed)
Please call patient and let him know that his pathology came back and did show a skin cancer. I will put in a referral to Dermatology on Dr. Verl Blalock behalf.

## 2013-07-11 ENCOUNTER — Encounter: Payer: Self-pay | Admitting: Family Medicine

## 2013-07-11 ENCOUNTER — Other Ambulatory Visit: Payer: Self-pay | Admitting: Family Medicine

## 2013-07-11 ENCOUNTER — Ambulatory Visit (INDEPENDENT_AMBULATORY_CARE_PROVIDER_SITE_OTHER): Payer: Medicare Other | Admitting: Family Medicine

## 2013-07-11 DIAGNOSIS — C4492 Squamous cell carcinoma of skin, unspecified: Secondary | ICD-10-CM

## 2013-07-11 NOTE — Progress Notes (Signed)
[facility] [address], [place] [address] [phone]  Date:  07/11/2013   Name:  Jonathan Acevedo   DOB:  08-14-1928   MRN:  [id number]  PCP:  Florina Ou, MD    Chief Complaint: No chief complaint on file.   History of Present Illness:  Jonathan Acevedo is a 80 y.o. very pleasant male patient who presents with the following:  Here today just to go over labs.  He was here on 6/1 with a suspected sebaceous cyst on his chest.  I and D tried but it did not drain so a shave was done and sent to path.    Patient Active Problem List   Diagnosis Date Noted  . Acute on chronic systolic congestive heart failure 05/25/2013  . Acute on chronic combined systolic and diastolic CHF (congestive heart failure) 05/18/2013  . Syncope 05/18/2013  . Varicose veins of lower extremities with other complications 39/76/7341  . Chronic venous insufficiency 07/29/2012  . 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 Chronic systolic CHF (congestive heart failure) 07/03/2011  . Primary hyperparathyroidism 07/03/2011  . Ischemic cardiomyopathy 11/29/2010  . Myocardial infarction   . Coronary artery disease   . Hypertension   . Chronic renal insufficiency, stage III (moderate)   . Aortic stenosis   . Rectal polyp   . Hypercholesteremia     Past Medical History  Diagnosis Date  . Coronary artery disease     w remote anterior myocardial infarction (1998)  . Hypertension   . Renal insufficiency   . Aortic stenosis   . Rectal polyp   . Hypercholesteremia   . Kidney stones   . 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 Hypercalcemia 12/26/2011  . Afib   . Syncope 05/18/2013    Past Surgical History  Procedure Laterality Date  . Cardiac catheterization  08/18/2008    LMain 20, LAD stent 20 ISR, CFX 10, RCA 20, EF 45  . Umbilical hernia repair    . Inguinal hernia repair      left  . Orif femur fracture      right  . Cataract extraction    . Coronary stent placement  07/19/1996   Proximal LAD  . Parathyroidectomy    . Loop recorder implant  05-19-13    MDT LinQ implanted by Dr Lovena Le for syncope following negative EPS  . Electrophysiology study  05-19-13    EPS for syncope without inducible arrhythmias by Dr Lovena Le    History  Substance Use Topics  . Smoking status: Former Smoker -- 1.00 packs/day for 45 years    Types: Cigarettes    Quit date: 07/19/1996  . Smokeless tobacco: Never Used  . Alcohol Use: No    No family history on file.  Allergies  Allergen Reactions  . Ibandronic Acid Other (See Comments)    Muscle Aches-pt denies  . Risedronate Other (See Comments)    unknown  . Risedronate Sodium Other (See Comments)    Reaction=unknown    Medication list has been reviewed and updated.  Current Outpatient Prescriptions on File Prior to Visit  Medication Sig Dispense Refill  . aspirin 325 MG EC tablet Take 325 mg by mouth every morning.       . cephALEXin (KEFLEX) 500 MG capsule Take 1 capsule (500 mg total) by mouth 4 (four) times daily.  28 capsule  0  . Cholecalciferol (VITAMIN D PO) Take by mouth.      . ezetimibe-simvastatin (VYTORIN) 10-20 MG per  tablet Take 1 tablet by mouth daily.  30 tablet  6  . ferrous sulfate (SM IRON) 325 (65 FE) MG tablet Take 325 mg by mouth daily.      . furosemide (LASIX) 20 MG tablet Take 2 tablets (40 mg total) by mouth daily.  60 tablet  0  . HYDROcodone-acetaminophen (NORCO/VICODIN) 5-325 MG per tablet Take 1 tablet by mouth every 6 (six) hours as needed for moderate pain.  20 tablet  0  . nitroGLYCERIN (NITROSTAT) 0.4 MG SL tablet Place 1 tablet (0.4 mg total) under the tongue every 5 (five) minutes as needed for chest pain.  25 tablet  3  . Oyster Shell (OYSTER CALCIUM) 500 MG TABS Take 500 mg of elemental calcium by mouth daily.       . potassium chloride SA (K-DUR,KLOR-CON) 20 MEQ tablet Take 1 tablet (20 mEq total) by mouth daily.  30 tablet  11  . traMADol (ULTRAM) 50 MG tablet Take 1 tablet (50 mg total)  by mouth every 8 (eight) hours as needed.  30 tablet  0   No current facility-administered medications on file prior to visit.    Review of Systems:    Physical Examination: There were no vitals filed for this visit. There were no vitals filed for this visit. There is no weight on file to calculate BMI. Ideal Body Weight:    Biopsy came back positive for squamous cell ca  Patient elects to have the rest of the lesion removed.  After sterile prep with Betadine, the lesion at the manubrium was infiltrated with Xylocaine with epi an elliptical skin incision was then carried out to remove the entire affected area. A suture was then placed in the inferior portion of the elliptical biopsy specimen to identify the inferior margin.  The incision was then closed with 5-0 Ethilon. 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 Pathology report pending over the excisional biopsy. He's to return in a week for suture removal and will be given instructions to return for any redness, increased swelling, drainage, or other questions that may arise  Signed, Carola Frost.D.

## 2013-07-11 NOTE — Telephone Encounter (Signed)
Lm for rtn call 

## 2013-07-11 NOTE — Progress Notes (Signed)
   Patient ID: Jonathan Acevedo MRN: [id number], DOB: 11-08-1928, 80 y.o. Date of Encounter: 07/11/2013, 5:41 PM   PROCEDURE NOTE: Elliptical excision performed by Dr. Joseph Art of known squamous cell skin cancer along the anterior chest wall.  Verbal consent obtained.  Sterile technique employed. Numbing: Anesthesia obtained by Dr. Joseph Art.  Betadine prep per usual protocol by Dr. Joseph Art.  Elliptical biopsy site repaired with # 2 simple interrupted sutures of 5-0 Ethilon.  Hemostasis obtained. Wound cleansed and dressed.  Wound care instructions including precautions covered with patient. Handout given.  Anticipate suture removal in 10 days.   Signed, Christell Faith, MHS, PA-C [facility] [place], [place] [id number] [facility] 07/11/2013 5:41 PM

## 2013-07-12 ENCOUNTER — Telehealth: Payer: Self-pay | Admitting: *Deleted

## 2013-07-12 NOTE — Telephone Encounter (Signed)
Pt was here yesterday and notified at time of OV

## 2013-07-12 NOTE — Telephone Encounter (Signed)
Gave pt return call in regards to pathology results. States that he was seen in office last night and was made aware at that time.

## 2013-07-18 ENCOUNTER — Ambulatory Visit (INDEPENDENT_AMBULATORY_CARE_PROVIDER_SITE_OTHER): Payer: Medicare Other | Admitting: *Deleted

## 2013-07-18 DIAGNOSIS — R55 Syncope and collapse: Secondary | ICD-10-CM

## 2013-07-18 LAB — MDC_IDC_ENUM_SESS_TYPE_REMOTE

## 2013-07-23 NOTE — Progress Notes (Signed)
Loop recorder 

## 2013-07-25 ENCOUNTER — Other Ambulatory Visit: Payer: Self-pay

## 2013-07-25 MED ORDER — FUROSEMIDE 20 MG PO TABS: 40.0000 mg | ORAL_TABLET | Freq: Every day | ORAL | Status: DC

## 2013-08-11 ENCOUNTER — Inpatient Hospital Stay (HOSPITAL_COMMUNITY)
Admission: EM | Admit: 2013-08-11 | Discharge: 2013-08-13 | DRG: 293 | Disposition: A | Discharge status: Home / Self Care | Payer: Medicare Other | Attending: Internal Medicine | Admitting: Internal Medicine

## 2013-08-11 ENCOUNTER — Encounter (HOSPITAL_COMMUNITY): Payer: Self-pay | Admitting: Emergency Medicine

## 2013-08-11 ENCOUNTER — Emergency Department (HOSPITAL_COMMUNITY): Payer: Medicare Other

## 2013-08-11 ENCOUNTER — Ambulatory Visit (INDEPENDENT_AMBULATORY_CARE_PROVIDER_SITE_OTHER): Payer: Medicare Other | Admitting: *Deleted

## 2013-08-11 DIAGNOSIS — Z7982 Long term (current) use of aspirin: Secondary | ICD-10-CM

## 2013-08-11 DIAGNOSIS — I5023 Acute on chronic systolic (congestive) heart failure: Secondary | ICD-10-CM

## 2013-08-11 DIAGNOSIS — I509 Heart failure, unspecified: Secondary | ICD-10-CM | POA: Diagnosis present

## 2013-08-11 DIAGNOSIS — I255 Ischemic cardiomyopathy: Secondary | ICD-10-CM

## 2013-08-11 DIAGNOSIS — R55 Syncope and collapse: Secondary | ICD-10-CM

## 2013-08-11 DIAGNOSIS — Z87891 Personal history of nicotine dependence: Secondary | ICD-10-CM

## 2013-08-11 DIAGNOSIS — Z888 Allergy status to other drugs, medicaments and biological substances status: Secondary | ICD-10-CM

## 2013-08-11 DIAGNOSIS — N183 Chronic kidney disease, stage 3 unspecified: Secondary | ICD-10-CM | POA: Diagnosis present

## 2013-08-11 DIAGNOSIS — Z79899 Other long term (current) drug therapy: Secondary | ICD-10-CM

## 2013-08-11 DIAGNOSIS — I252 Old myocardial infarction: Secondary | ICD-10-CM

## 2013-08-11 DIAGNOSIS — I5043 Acute on chronic combined systolic (congestive) and diastolic (congestive) heart failure: Principal | ICD-10-CM | POA: Diagnosis present

## 2013-08-11 DIAGNOSIS — I10 Essential (primary) hypertension: Secondary | ICD-10-CM

## 2013-08-11 DIAGNOSIS — I2510 Atherosclerotic heart disease of native coronary artery without angina pectoris: Secondary | ICD-10-CM | POA: Diagnosis present

## 2013-08-11 DIAGNOSIS — R072 Precordial pain: Secondary | ICD-10-CM

## 2013-08-11 DIAGNOSIS — R079 Chest pain, unspecified: Secondary | ICD-10-CM | POA: Diagnosis present

## 2013-08-11 DIAGNOSIS — Z9861 Coronary angioplasty status: Secondary | ICD-10-CM

## 2013-08-11 DIAGNOSIS — Z87442 Personal history of urinary calculi: Secondary | ICD-10-CM

## 2013-08-11 DIAGNOSIS — I5033 Acute on chronic diastolic (congestive) heart failure: Secondary | ICD-10-CM

## 2013-08-11 DIAGNOSIS — I129 Hypertensive chronic kidney disease with stage 1 through stage 4 chronic kidney disease, or unspecified chronic kidney disease: Secondary | ICD-10-CM | POA: Diagnosis present

## 2013-08-11 DIAGNOSIS — Z9849 Cataract extraction status, unspecified eye: Secondary | ICD-10-CM

## 2013-08-11 DIAGNOSIS — I2584 Coronary atherosclerosis due to calcified coronary lesion: Secondary | ICD-10-CM

## 2013-08-11 DIAGNOSIS — E7800 Pure hypercholesterolemia, unspecified: Secondary | ICD-10-CM | POA: Diagnosis present

## 2013-08-11 DIAGNOSIS — I350 Nonrheumatic aortic (valve) stenosis: Secondary | ICD-10-CM

## 2013-08-11 DIAGNOSIS — I359 Nonrheumatic aortic valve disorder, unspecified: Secondary | ICD-10-CM | POA: Diagnosis present

## 2013-08-11 DIAGNOSIS — I5031 Acute diastolic (congestive) heart failure: Secondary | ICD-10-CM

## 2013-08-11 LAB — BASIC METABOLIC PANEL
ANION GAP: 13 (ref 5–15)
BUN: 27 mg/dL — AB (ref 6–23)
CO2: 26 mEq/L (ref 19–32)
CREATININE: 1.61 mg/dL — AB (ref 0.50–1.35)
Calcium: 9.1 mg/dL (ref 8.4–10.5)
Chloride: 104 mEq/L (ref 96–112)
GFR, EST AFRICAN AMERICAN: 44 mL/min — AB (ref >90)
GFR, EST NON AFRICAN AMERICAN: 38 mL/min — AB (ref >90)
Glucose, Bld: 102 mg/dL — ABNORMAL HIGH (ref 70–99)
POTASSIUM: 4.2 meq/L (ref 3.7–5.3)
Sodium: 143 mEq/L (ref 137–147)

## 2013-08-11 LAB — CBC
HCT: 35.5 % — ABNORMAL LOW (ref 39.0–52.0)
Hemoglobin: 11.7 g/dL — ABNORMAL LOW (ref 13.0–17.0)
MCH: 30.5 pg (ref 26.0–34.0)
MCHC: 33 g/dL (ref 30.0–36.0)
MCV: 92.4 fL (ref 78.0–100.0)
Platelets: 192 10*3/uL (ref 150–400)
RBC: 3.84 MIL/uL — ABNORMAL LOW (ref 4.22–5.81)
RDW: 14.8 % (ref 11.5–15.5)
WBC: 6.6 10*3/uL (ref 4.0–10.5)

## 2013-08-11 LAB — PRO B NATRIURETIC PEPTIDE: Pro B Natriuretic peptide (BNP): 3487 pg/mL — ABNORMAL HIGH (ref 0–450)

## 2013-08-11 LAB — I-STAT TROPONIN, ED: TROPONIN I, POC: 0.02 ng/mL (ref 0.00–0.08)

## 2013-08-11 MED ORDER — FUROSEMIDE 10 MG/ML IJ SOLN
40.0000 mg | Freq: Once | INTRAMUSCULAR | Status: AC
  Administered 2013-08-12: 40 mg via INTRAVENOUS
  Filled 2013-08-11: qty 4

## 2013-08-11 MED ORDER — IBUPROFEN 800 MG PO TABS
800.0000 mg | ORAL_TABLET | Freq: Once | ORAL | Status: AC
  Administered 2013-08-11: 800 mg via ORAL
  Filled 2013-08-11: qty 1

## 2013-08-11 NOTE — ED Provider Notes (Signed)
CSN: [id number]     Arrival date & time 08/11/13  1951 History   First MD Initiated Contact with Patient 08/11/13 2214     Chief Complaint  Patient presents with  . Chest Pain     (Consider location/radiation/quality/duration/timing/severity/associated sxs/prior Treatment) HPI Comments: Patient presents with episode of chest tightness, shortness of breath and generalized weakness that onset while he was lying down for a nap. His dog was barking when the pain started. It lasted for about 5-10 minutes and resolved after 2 nitroglycerin. Similar to his heart pain in the past. He is concerned he is "fluid overload". He denies any shortness of breath and chest tightness currently. No fever, cough or vomiting. State compliance with his medications.    The history is provided by the patient.    Past Medical History  Diagnosis Date  . Coronary artery disease     w remote anterior myocardial infarction (1998)  . Hypertension   . Renal insufficiency   . Aortic stenosis   . Rectal polyp   . Hypercholesteremia   . Kidney stones   . 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 Hypercalcemia 12/26/2011  . Afib   . Syncope 05/18/2013   Past Surgical History  Procedure Laterality Date  . Cardiac catheterization  08/18/2008    LMain 20, LAD stent 20 ISR, CFX 10, RCA 20, EF 45  . Umbilical hernia repair    . Inguinal hernia repair      left  . Orif femur fracture      right  . Cataract extraction    . Coronary stent placement  07/19/1996    Proximal LAD  . Parathyroidectomy    . Loop recorder implant  05-19-13    MDT LinQ implanted by Dr Lovena Le for syncope following negative EPS  . Electrophysiology study  05-19-13    EPS for syncope without inducible arrhythmias by Dr Lovena Le   No family history on file. History  Substance Use Topics  . Smoking status: Former Smoker -- 1.00 packs/day for 45 years    Types: Cigarettes    Quit date: 07/19/1996  . Smokeless tobacco: Never Used  . Alcohol Use: No     Review of Systems  Constitutional: Negative for activity change and appetite change.  HENT: Negative for congestion and rhinorrhea.   Respiratory: Positive for chest tightness and shortness of breath. Negative for cough.   Cardiovascular: Positive for chest pain.  Gastrointestinal: Negative for nausea, vomiting and abdominal pain.  Genitourinary: Negative for dysuria, urgency and hematuria.  Musculoskeletal: Negative for arthralgias and myalgias.  Skin: Negative for rash.  Neurological: Negative for dizziness, weakness and headaches.  A complete 10 system review of systems was obtained and all systems are negative except as noted in the HPI and PMH.      Allergies  Ibandronic acid and Risedronate sodium  Home Medications   Prior to Admission medications   Medication Sig Start Date End Date Taking? Authorizing Provider  aspirin 325 MG EC tablet Take 325 mg by mouth every morning.    Yes Historical Provider, MD  Cholecalciferol (VITAMIN D PO) Take 1 tablet by mouth daily.    Yes Historical Provider, MD  ezetimibe-simvastatin (VYTORIN) 10-20 MG per tablet Take 1 tablet by mouth daily. 05/07/13  Yes Peter M Martinique, MD  ferrous sulfate (SM IRON) 325 (65 FE) MG tablet Take 325 mg by mouth daily.   Yes Historical Provider, MD  furosemide (LASIX) 20 MG tablet Take 2 tablets (40 mg total) by  mouth daily. 07/25/13  Yes Peter M Martinique, MD  HYDROcodone-acetaminophen (NORCO/VICODIN) 5-325 MG per tablet Take 1 tablet by mouth every 6 (six) hours as needed for moderate pain. 05/13/13  Yes Leandrew Koyanagi, MD  nitroGLYCERIN (NITROSTAT) 0.4 MG SL tablet Place 1 tablet (0.4 mg total) under the tongue every 5 (five) minutes as needed for chest pain. 12/26/12  Yes Peter M Martinique, MD  Oyster Shell (OYSTER CALCIUM) 500 MG TABS Take 500 mg of elemental calcium by mouth daily.    Yes Historical Provider, MD  potassium chloride SA (K-DUR,KLOR-CON) 20 MEQ tablet Take 1 tablet (20 mEq total) by mouth daily.  05/27/13  Yes Rhonda G Barrett, PA-C   BP 111/63  Pulse 59  Temp(Src) 97.2 F (36.2 C) (Oral)  Resp 22  Ht 5\' 10"  (1.778 m)  Wt 199 lb 12.8 oz (90.629 kg)  BMI 28.67 kg/m2  SpO2 99% Physical Exam  Nursing note and vitals reviewed. Constitutional: He is oriented to person, place, and time. He appears well-developed and well-nourished. No distress.  HENT:  Head: Normocephalic and atraumatic.  Mouth/Throat: Oropharynx is clear and moist. No oropharyngeal exudate.  Eyes: Conjunctivae and EOM are normal. Pupils are equal, round, and reactive to light.  Neck: Normal range of motion. Neck supple.  No meningismus.  Cardiovascular: Normal rate, regular rhythm, normal heart sounds and intact distal pulses.   No murmur heard. Pulmonary/Chest: Effort normal and breath sounds normal. No respiratory distress.  Abdominal: Soft. There is no tenderness. There is no rebound and no guarding.  Musculoskeletal: Normal range of motion. He exhibits no edema and no tenderness.  Neurological: He is alert and oriented to person, place, and time. No cranial nerve deficit. He exhibits normal muscle tone. Coordination normal.  No ataxia on finger to nose bilaterally. No pronator drift. 5/5 strength throughout. CN 2-12 intact. Negative Romberg. Equal grip strength. Sensation intact. Gait is normal.   Skin: Skin is warm.  Psychiatric: He has a normal mood and affect. His behavior is normal.    ED Course  Procedures (including critical care time) Labs Review Labs Reviewed  CBC - Abnormal; Notable for the following:    RBC 3.84 (*)    Hemoglobin 11.7 (*)    HCT 35.5 (*)    All other components within normal limits  BASIC METABOLIC PANEL - Abnormal; Notable for the following:    Glucose, Bld 102 (*)    BUN 27 (*)    Creatinine, Ser 1.61 (*)    GFR calc non Af Amer 38 (*)    GFR calc Af Amer 44 (*)    All other components within normal limits  PRO B NATRIURETIC PEPTIDE - Abnormal; Notable for the  following:    Pro B Natriuretic peptide (BNP) 3487.0 (*)    All other components within normal limits  TROPONIN I  CBC  CREATININE, SERUM  TROPONIN I  TROPONIN I  TROPONIN I  I-STAT TROPOININ, ED    Imaging Review Dg Chest 2 View  08/11/2013   CLINICAL DATA:  Chest pain.  Previous myocardial infarct.  EXAM: CHEST  2 VIEW  COMPARISON:  05/25/13  FINDINGS: Mild cardiomegaly stable. Mild scarring again seen at the left lung base. No evidence of pulmonary infiltrate or edema. No evidence of pleural effusion. Ectasia thoracic aorta is stable.  IMPRESSION: Stable mild cardiomegaly and left basilar scarring. No active disease.   Electronically Signed   By: Earle Gell M.D.   On: 08/11/2013 21:04  EKG Interpretation   Date/Time:  Monday August 11 2013 19:54:22 EDT Ventricular Rate:  62 PR Interval:  178 QRS Duration: 96 QT Interval:  434 QTC Calculation: 440 R Axis:   -67 Text Interpretation:  Normal sinus rhythm Possible Left atrial enlargement  Incomplete right bundle branch block Left anterior fascicular block  Anteroseptal infarct , age undetermined Abnormal ECG No significant change  was found Confirmed by Wyvonnia Dusky  MD, [place] [phone]) on 08/11/2013 10:38:28  PM      MDM   Final diagnoses:  Acute diastolic congestive heart failure   Episode of chest tightness and shortness of breath onset at rest, now resolved after nitroglycerin. Patient feels back to baseline. EKG unchanged.  Chest x-ray is clear without pulmonary edema. Patient is 100% on room air. With exertion he dropped to 85%. He denies any shortness of breath or chest pain. BNP elevated at 3000  Catheterization 2015 April showed nonobstructive CAD with patent stent Final Conclusions:  1. Nonobstructive CAD  2. Moderate to severe LV dysfunction.  3. Mild to moderate aortic stenosis by gradient.  4. Elevated LVEDP.  D/w cardiology fellow Dr. Delane Ginger.  Concern for diastolic HF exacerbation.  Cr. 1.6 which is baseline.   IV lasix given.  Will be admitted for rule out and diuresis.    Ezequiel Essex, MD 08/12/13 [phone]

## 2013-08-11 NOTE — ED Notes (Signed)
Pt states he went to lay down for a nap when he had sudden onset of central chest tightness with SObBand weakness. States he took 2 nitro which relieved pain. Pt now denies pain, but reports continued weakness. NAD. Hx: 3 MI's, CHF

## 2013-08-11 NOTE — ED Notes (Signed)
Pt ambulated in hallway- o2 saturation dropped to approx 85%. Once back at rest o2 sat increased back up to 97% RA. Pt HR also increased with exertion. Pt denies sob but has audible wheezes.

## 2013-08-11 NOTE — H&P (Addendum)
Cardiology Consultation Note  Patient ID: Jonathan Acevedo, MRN: [id number], DOB/AGE: [birth year] 78 y.o. Admit date: 08/11/2013   Date of Consult: 08/11/2013 Primary Physician: Florina Ou, MD   Chief Complaint: chest pain    Assessment and Plan:  Pre-cordial chest pain in pt with hx of CAD  CHF - systolic , acute on chronic  AS mild to moderate   Plan  -Add lasix 40 IV BID -Will start low dose lisinopril at 2.5 mg BID. Hold on B-blocker for now since HR <60 and pt has hx of syncope with polypharmacy - monitor on tele  - cont Aspirin , statin , monitor on tele  - rule out ACS, pt has had recent LHC      80 yr old male with history of CAD with remote MI and prior stent to the LAD ( last cath 05/2013 non obstructive disease) , HTN, CKD ( cr 1.6)  Mild to mod AS ( mean grad 24 ) CMP EF 40-45%,  here with chest pain and concern for fluid overload   HPI ;pt states that over the past few days he has noticed increased shortness of breath while lying down and feeling overall ' uneasy' . Today while he was sitting he had onset of left sided chest pressure that radiated to his neck and resolved completely with  2 tabs of SL NTG. He has recently not been able to be as active as he would like since he broke his foot after a fall . However pt denies any overt exertional chest pain.  No  focal weakness, syncope, bleeding diathesis , claudication , palpitation etc .  Reports medication compliance and low salt diet. States that his ideal weight has been around 185lbs and at its worse was 205 lbs at the time of his heart failure exacerbation .   Past Medical History  Diagnosis Date  . Coronary artery disease     w remote anterior myocardial infarction (1998)  . Hypertension   . Renal insufficiency   . Aortic stenosis   . Rectal polyp   . Hypercholesteremia   . Kidney stones   . 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 Hypercalcemia 12/26/2011  . Afib   . Syncope 05/18/2013      Most Recent Cardiac  Studies: [facility] 05/2013 Procedural Findings:  Hemodynamics:  AO 141/78 mean 107 mm Hg  LV 157/28 mm Hg  AV gradient of 19 mm Hg  Coronary angiography:  Coronary dominance: right  Left mainstem: 20% ostial disease  Left anterior descending (LAD): There is a segment of stents in the proximal LAD that is widely patent. No obstructive disease.  Left circumflex (LCx): Large vessel with 20% disease in the proximal vessel.  Right coronary artery (RCA): Moderately calcified with 20% disease in the proximal and mid vessel.  Left ventriculography: Left ventricular systolic function is abnormal, there is akinesis of the mid to distal anterior wall and entire apex. LVEF is estimated at 35-40%, there is no significant mitral regurgitation  Final Conclusions:  1. Nonobstructive CAD  2. Moderate to severe LV dysfunction.  3. Mild to moderate aortic stenosis by gradient.  4. Elevated LVEDP.   Echo 05/2013 - When compared to the prior study from 2014 left ventricle is now midly dilated with elevated filling pressures. LVEF is the same estimated at 40-45%. Aortic stenosis is unchanged estimated as moderate with mean gradient 24 mmHg.  Stresst test 05/2012 old infarct , EF 44%     Surgical History:  Past  Surgical History  Procedure Laterality Date  . Cardiac catheterization  08/18/2008    LMain 20, LAD stent 20 ISR, CFX 10, RCA 20, EF 45  . Umbilical hernia repair    . Inguinal hernia repair      left  . Orif femur fracture      right  . Cataract extraction    . Coronary stent placement  07/19/1996    Proximal LAD  . Parathyroidectomy    . Loop recorder implant  05-19-13    MDT LinQ implanted by Dr Lovena Le for syncope following negative EPS  . Electrophysiology study  05-19-13    EPS for syncope without inducible arrhythmias by Dr Lovena Le     Home Meds: Prior to Admission medications   Medication Sig Start Date End Date Taking? Authorizing Provider  aspirin 325 MG EC tablet Take 325 mg by  mouth every morning.     Historical Provider, MD  cephALEXin (KEFLEX) 500 MG capsule Take 1 capsule (500 mg total) by mouth 4 (four) times daily. 07/07/13   Boris Lown, MD  Cholecalciferol (VITAMIN D PO) Take by mouth.    Historical Provider, MD  ezetimibe-simvastatin (VYTORIN) 10-20 MG per tablet Take 1 tablet by mouth daily. 05/07/13   Peter M Martinique, MD  ferrous sulfate (SM IRON) 325 (65 FE) MG tablet Take 325 mg by mouth daily.    Historical Provider, MD  furosemide (LASIX) 20 MG tablet Take 2 tablets (40 mg total) by mouth daily. 07/25/13   Peter M Martinique, MD  HYDROcodone-acetaminophen (NORCO/VICODIN) 5-325 MG per tablet Take 1 tablet by mouth every 6 (six) hours as needed for moderate pain. 05/13/13   Leandrew Koyanagi, MD  nitroGLYCERIN (NITROSTAT) 0.4 MG SL tablet Place 1 tablet (0.4 mg total) under the tongue every 5 (five) minutes as needed for chest pain. 12/26/12   Peter M Martinique, MD  Oyster Shell (OYSTER CALCIUM) 500 MG TABS Take 500 mg of elemental calcium by mouth daily.     Historical Provider, MD  potassium chloride SA (K-DUR,KLOR-CON) 20 MEQ tablet Take 1 tablet (20 mEq total) by mouth daily. 05/27/13   Rhonda G Barrett, PA-C  traMADol (ULTRAM) 50 MG tablet Take 1 tablet (50 mg total) by mouth every 8 (eight) hours as needed. 07/07/13   Boris Lown, MD    Inpatient Medications:     Allergies:  Allergies  Allergen Reactions  . Ibandronic Acid Other (See Comments)    Muscle Aches-pt denies  . Risedronate Other (See Comments)    unknown  . Risedronate Sodium Other (See Comments)    Reaction=unknown    History   Social History  . Marital Status: Married    Spouse Name: N/A    Number of Children: N/A  . Years of Education: N/A   Occupational History  . [employer]     retired   Social History Main Topics  . Smoking status: Former Smoker -- 1.00 packs/day for 45 years    Types: Cigarettes    Quit date: 07/19/1996  . Smokeless tobacco: Never Used  . Alcohol  Use: No  . Drug Use: No  . Sexual Activity: Not on file   Other Topics Concern  . Not on file   Social History Narrative   Lives with wife, generally exercises regularly.     No family history on file.   Review of Systems: General: negative for chills, fever, night sweats or weight changes.  Cardiovascular: per hpi  Dermatological: negative for rash  Respiratory: negative for cough or wheezing Urologic: negative for hematuria Abdominal: negative for nausea, vomiting, diarrhea, bright red blood per rectum, melena, or hematemesis Neurologic: negative for visual changes, syncope, or dizziness All other systems reviewed and are otherwise negative except as noted above.  Labs: No results found for this basename: CKTOTAL, CKMB, TROPONINI,  in the last 72 hours Lab Results  Component Value Date   WBC 6.6 08/11/2013   HGB 11.7* 08/11/2013   HCT 35.5* 08/11/2013   MCV 92.4 08/11/2013   PLT 192 08/11/2013    Recent Labs Lab 08/11/13 2006  NA 143  K 4.2  CL 104  CO2 26  BUN 27*  CREATININE 1.61*  CALCIUM 9.1  GLUCOSE 102*   Lab Results  Component Value Date   CHOL 133 11/17/2012   HDL 54 11/17/2012   LDLCALC 58 11/17/2012   TRIG 105 11/17/2012   No results found for this basename: DDIMER    Radiology/Studies:  Dg Chest 2 View  08/11/2013   CLINICAL DATA:  Chest pain.  Previous myocardial infarct.  EXAM: CHEST  2 VIEW  COMPARISON:  05/25/13  FINDINGS: Mild cardiomegaly stable. Mild scarring again seen at the left lung base. No evidence of pulmonary infiltrate or edema. No evidence of pleural effusion. Ectasia thoracic aorta is stable.  IMPRESSION: Stable mild cardiomegaly and left basilar scarring. No active disease.   Electronically Signed   By: Earle Gell M.D.   On: 08/11/2013 21:04    EKG: NSR, LAFB, PRWP   Physical Exam Blood pressure 144/81, pulse 55, temperature 98.2 F (36.8 C), temperature source Oral, resp. rate 15, height 5\' 10"  (1.778 m), weight 89.359 kg (197 lb),  SpO2 99.00%. General: Well developed, well nourished, in no acute distress.  Neck: Negative for carotid bruits. JVD not elevated. Lungs: Clear bilaterally to auscultation without wheezes, rales, or rhonchi. Breathing is unlabored. Heart: RRR with S1 S2. 2/6 mid peaking harsh systolic murmur at the base  Abdomen: Soft, non-tender, non-distended with normoactive bowel sounds. No hepatomegaly. No rebound/guarding. No obvious abdominal masses. Extremities: No clubbing or cyanosis. No edema.  Distal pedal pulses are 2+ and equal bilaterally. Neuro: Alert and oriented X 3. No facial asymmetry. No focal deficit. Moves all extremities spontaneously. Psych:  Responds to questions appropriately with a normal affect.       Cory Roughen, A M.D  08/11/2013, 11:23 PM

## 2013-08-12 DIAGNOSIS — R079 Chest pain, unspecified: Secondary | ICD-10-CM | POA: Diagnosis present

## 2013-08-12 DIAGNOSIS — I5033 Acute on chronic diastolic (congestive) heart failure: Secondary | ICD-10-CM | POA: Diagnosis present

## 2013-08-12 DIAGNOSIS — I2510 Atherosclerotic heart disease of native coronary artery without angina pectoris: Secondary | ICD-10-CM

## 2013-08-12 DIAGNOSIS — N183 Chronic kidney disease, stage 3 unspecified: Secondary | ICD-10-CM

## 2013-08-12 DIAGNOSIS — I2589 Other forms of chronic ischemic heart disease: Secondary | ICD-10-CM

## 2013-08-12 LAB — CREATININE, SERUM
Creatinine, Ser: 1.72 mg/dL — ABNORMAL HIGH (ref 0.50–1.35)
GFR calc Af Amer: 41 mL/min — ABNORMAL LOW (ref >90)
GFR calc non Af Amer: 35 mL/min — ABNORMAL LOW (ref >90)

## 2013-08-12 LAB — TROPONIN I
Troponin I: <0.3 ng/mL (ref <0.30)
Troponin I: <0.3 ng/mL (ref <0.30)
Troponin I: <0.3 ng/mL (ref <0.30)

## 2013-08-12 LAB — CBC
HCT: 32.4 % — ABNORMAL LOW (ref 39.0–52.0)
HEMOGLOBIN: 10.5 g/dL — AB (ref 13.0–17.0)
MCH: 29.7 pg (ref 26.0–34.0)
MCHC: 32.4 g/dL (ref 30.0–36.0)
MCV: 91.8 fL (ref 78.0–100.0)
Platelets: 175 10*3/uL (ref 150–400)
RBC: 3.53 MIL/uL — ABNORMAL LOW (ref 4.22–5.81)
RDW: 14.6 % (ref 11.5–15.5)
WBC: 5.5 10*3/uL (ref 4.0–10.5)

## 2013-08-12 MED ORDER — POTASSIUM CHLORIDE CRYS ER 20 MEQ PO TBCR
20.0000 meq | EXTENDED_RELEASE_TABLET | Freq: Every day | ORAL | Status: DC
  Administered 2013-08-12 – 2013-08-13 (×2): 20 meq via ORAL
  Filled 2013-08-12 (×2): qty 1

## 2013-08-12 MED ORDER — HYDROCODONE-ACETAMINOPHEN 5-325 MG PO TABS
1.0000 | ORAL_TABLET | Freq: Four times a day (QID) | ORAL | Status: DC | PRN
  Administered 2013-08-12: 1 via ORAL
  Filled 2013-08-12: qty 1

## 2013-08-12 MED ORDER — LISINOPRIL 2.5 MG PO TABS
2.5000 mg | ORAL_TABLET | Freq: Two times a day (BID) | ORAL | Status: DC
  Administered 2013-08-12 (×2): 2.5 mg via ORAL
  Filled 2013-08-12 (×5): qty 1

## 2013-08-12 MED ORDER — FERROUS SULFATE 325 (65 FE) MG PO TABS
325.0000 mg | ORAL_TABLET | Freq: Every day | ORAL | Status: DC
  Administered 2013-08-12 – 2013-08-13 (×2): 325 mg via ORAL
  Filled 2013-08-12 (×2): qty 1

## 2013-08-12 MED ORDER — SODIUM CHLORIDE 0.9 % IJ SOLN
3.0000 mL | Freq: Two times a day (BID) | INTRAMUSCULAR | Status: DC
Start: 2013-08-12 — End: 2013-08-13
  Administered 2013-08-12 – 2013-08-13 (×4): 3 mL via INTRAVENOUS

## 2013-08-12 MED ORDER — SODIUM CHLORIDE 0.9 % IJ SOLN: 3.0000 mL | INTRAMUSCULAR | Status: DC | PRN

## 2013-08-12 MED ORDER — NITROGLYCERIN 0.4 MG SL SUBL: 0.4000 mg | SUBLINGUAL_TABLET | SUBLINGUAL | Status: DC | PRN

## 2013-08-12 MED ORDER — ACETAMINOPHEN 325 MG PO TABS
650.0000 mg | ORAL_TABLET | ORAL | Status: DC | PRN
  Administered 2013-08-13: 650 mg via ORAL
  Filled 2013-08-12: qty 2

## 2013-08-12 MED ORDER — ONDANSETRON HCL 4 MG/2ML IJ SOLN: 4.0000 mg | Freq: Four times a day (QID) | INTRAMUSCULAR | Status: DC | PRN

## 2013-08-12 MED ORDER — CEPHALEXIN 500 MG PO CAPS
500.0000 mg | ORAL_CAPSULE | Freq: Four times a day (QID) | ORAL | Status: DC
  Administered 2013-08-12 (×2): 500 mg via ORAL
  Filled 2013-08-12 (×5): qty 1

## 2013-08-12 MED ORDER — CALCIUM CARBONATE 1250 (500 CA) MG PO TABS
500.0000 mg | ORAL_TABLET | Freq: Every day | ORAL | Status: DC
  Administered 2013-08-12 – 2013-08-13 (×2): 500 mg via ORAL
  Filled 2013-08-12 (×2): qty 1

## 2013-08-12 MED ORDER — SODIUM CHLORIDE 0.9 % IV SOLN: 250.0000 mL | INTRAVENOUS | Status: DC | PRN

## 2013-08-12 MED ORDER — HEPARIN SODIUM (PORCINE) 5000 UNIT/ML IJ SOLN
5000.0000 [IU] | Freq: Three times a day (TID) | INTRAMUSCULAR | Status: DC
  Administered 2013-08-12 – 2013-08-13 (×5): 5000 [IU] via SUBCUTANEOUS
  Filled 2013-08-12 (×7): qty 1

## 2013-08-12 MED ORDER — FUROSEMIDE 10 MG/ML IJ SOLN
40.0000 mg | Freq: Two times a day (BID) | INTRAMUSCULAR | Status: DC
  Administered 2013-08-12 – 2013-08-13 (×3): 40 mg via INTRAVENOUS
  Filled 2013-08-12 (×5): qty 4

## 2013-08-12 MED ORDER — EZETIMIBE-SIMVASTATIN 10-20 MG PO TABS
1.0000 | ORAL_TABLET | Freq: Every day | ORAL | Status: DC
Start: 2013-08-12 — End: 2013-08-13
  Administered 2013-08-12 – 2013-08-13 (×2): 1 via ORAL
  Filled 2013-08-12 (×2): qty 1

## 2013-08-12 MED ORDER — ASPIRIN EC 81 MG PO TBEC
81.0000 mg | DELAYED_RELEASE_TABLET | Freq: Every day | ORAL | Status: DC
  Administered 2013-08-12 – 2013-08-13 (×2): 81 mg via ORAL
  Filled 2013-08-12 (×2): qty 1

## 2013-08-12 NOTE — Progress Notes (Signed)
Admitted pt from ED assisted by the Nurse Tech per stretcher. Alert and oriented x 4, very conversant, denies chest pain, denies nausea and vomiting, denies dizziness, not in respiratory distress. Placed on telemetry box [place], [place] notified. Oriented to room,call bell. Advised on strict I&O's. Will continue to monitor pt.

## 2013-08-12 NOTE — Progress Notes (Addendum)
SUBJECTIVE:  Breathing better this am  OBJECTIVE:   Vitals:   Filed Vitals:   08/12/13 0015 08/12/13 0109 08/12/13 0624 08/12/13 1120  BP: 147/76 111/63 121/78 116/72  Pulse:  59 51   Temp:  97.2 F (36.2 C) 97.4 F (36.3 C)   TempSrc:  Oral Oral   Resp: 22  18   Height:  5\' 10"  (1.778 m)    Weight:  199 lb 12.8 oz (90.629 kg) 198 lb 1.6 oz (89.858 kg)   SpO2:  99% 99%    I&O's:   Intake/Output Summary (Last 24 hours) at 08/12/13 1123 Last data filed at 08/12/13 1110  Gross per 24 hour  Intake    360 ml  Output   1540 ml  Net  -1180 ml   TELEMETRY: Reviewed telemetry pt in NSR:     PHYSICAL EXAM General: Well developed, well nourished, in no acute distress Head: Eyes PERRLA, No xanthomas.   Normal cephalic and atramatic  Lungs:   Clear bilaterally to auscultation and percussion. Heart:   HRRR S1 S2 Pulses are 2+ & equal. Abdomen: Bowel sounds are positive, abdomen soft and non-tender without masses  Extremities:   No clubbing, cyanosis or edema.  DP +1 Neuro: Alert and oriented X 3. Psych:  Good affect, responds appropriately   LABS: Basic Metabolic Panel:  Recent Labs  08/11/13 2006 08/12/13 0424  NA 143  --   K 4.2  --   CL 104  --   CO2 26  --   GLUCOSE 102*  --   BUN 27*  --   CREATININE 1.61* 1.72*  CALCIUM 9.1  --    Liver Function Tests: No results found for this basename: AST, ALT, ALKPHOS, BILITOT, PROT, ALBUMIN,  in the last 72 hours No results found for this basename: LIPASE, AMYLASE,  in the last 72 hours CBC:  Recent Labs  08/11/13 2006 08/12/13 0424  WBC 6.6 5.5  HGB 11.7* 10.5*  HCT 35.5* 32.4*  MCV 92.4 91.8  PLT 192 175   Cardiac Enzymes:  Recent Labs  08/11/13 2341 08/12/13 0424 08/12/13 0920  TROPONINI <0.30 <0.30 <0.30   BNP: No components found with this basename: POCBNP,  D-Dimer: No results found for this basename: DDIMER,  in the last 72 hours Hemoglobin A1C: No results found for this basename: HGBA1C,  in  the last 72 hours Fasting Lipid Panel: No results found for this basename: CHOL, HDL, LDLCALC, TRIG, CHOLHDL, LDLDIRECT,  in the last 72 hours Thyroid Function Tests: No results found for this basename: TSH, T4TOTAL, FREET3, T3FREE, THYROIDAB,  in the last 72 hours Anemia Panel: No results found for this basename: VITAMINB12, FOLATE, FERRITIN, TIBC, IRON, RETICCTPCT,  in the last 72 hours Coag Panel:   Lab Results  Component Value Date   INR 1.21 05/19/2013   INR 1.08 05/03/2012   INR 1.07 04/29/2009    RADIOLOGY: Dg Chest 2 View  08/11/2013   CLINICAL DATA:  Chest pain.  Previous myocardial infarct.  EXAM: CHEST  2 VIEW  COMPARISON:  05/25/13  FINDINGS: Mild cardiomegaly stable. Mild scarring again seen at the left lung base. No evidence of pulmonary infiltrate or edema. No evidence of pleural effusion. Ectasia thoracic aorta is stable.  IMPRESSION: Stable mild cardiomegaly and left basilar scarring. No active disease.   Electronically Signed   By: Earle Gell M.D.   On: 08/11/2013 21:04   Assessment and Plan:  Pre-cordial chest pain in pt with hx of  CAD - recent cath with nonobstructive ASCAD - CP likely not to be related to coronary ischemia and most likely due to acute CHF exacerbation. Cardiac enzymes negative x 3. No further ischemic workup indicated at this time. CHF - systolic , acute on chronic.  He admits to recent dietary indiscretion with sodium in his diet. He is 1L net neg since IV lasix started. His baseline weight at home is 205lbs for the past year but before that it was 185lbs.  His BNP was 200 when he was discharged in April after CHF exacerbation. AS mild to moderate  HTN - controlled CKD- creatinine bumped slightly with addition of ACE I and after getting Ibuprofen 800mg  in ER  Plan  - Continue IV lasix 40mg  BID - Hold on B-blocker for now since HR <60 and pt has hx of syncope with polypharmacy  - cont Aspirin/statin  - stop ACE I since creatinine bumped this am -  recheck BMET/BNP in am - consider adding amlodipine if BP remains elevated off ACE I - I will have the CHF nurse educator come up to do some CHF education on sodium restriction and weights - I have instructed him to avoid NSAID use which can worsen RF and cause fluid retention - probable d/c home in am if BNP and creatinine trending downward   Sueanne Margarita, MD  08/12/2013  11:23 AM

## 2013-08-12 NOTE — Progress Notes (Signed)
Heart Failure Navigator Consult Note  Presentation: Jonathan Acevedo is a 80 yr old male with history of CAD with remote MI and prior stent to the LAD ( last cath 05/2013 non obstructive disease) , HTN, CKD ( cr 1.6) Mild to mod AS ( mean grad 24 ) CMP EF 40-45%, here with chest pain and concern for fluid overload  HPI ;pt states that over the past few days he has noticed increased shortness of breath while lying down and feeling overall ' uneasy' . Today while he was sitting he had onset of left sided chest pressure that radiated to his neck and resolved completely with 2 tabs of SL NTG. He has recently not been able to be as active as he would like since he broke his foot after a fall . However pt denies any overt exertional chest pain. No focal weakness, syncope, bleeding diathesis , claudication , palpitation etc .  Reports medication compliance and low salt diet. States that his ideal weight has been around 185lbs and at its worse was 205 lbs at the time of his heart failure exacerbation    Past Medical History  Diagnosis Date  . Coronary artery disease     w remote anterior myocardial infarction (1998)  . Hypertension   . Renal insufficiency   . Aortic stenosis   . Rectal polyp   . Hypercholesteremia   . Kidney stones   . 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 Hypercalcemia 12/26/2011  . Afib   . Syncope 05/18/2013    History   Social History  . Marital Status: Married    Spouse Name: N/A    Number of Children: N/A  . Years of Education: N/A   Occupational History  . [employer]     retired   Social History Main Topics  . Smoking status: Former Smoker -- 1.00 packs/day for 45 years    Types: Cigarettes    Quit date: 07/19/1996  . Smokeless tobacco: Never Used  . Alcohol Use: No  . Drug Use: No  . Sexual Activity: None   Other Topics Concern  . None   Social History Narrative   Lives with wife, generally exercises regularly.    ECHO:Study Conclusions--05/18/13  - Left  ventricle: The cavity size was mildly dilated. There was moderate concentric hypertrophy. Systolic function was mildly to moderately reduced. The estimated ejection fraction was in the range of 40% to 45%. There is akinesis of the mid anteroseptal, distal septal and inferior wall and true apex. Doppler parameters are consistent with abnormal left ventricular relaxation (grade 1 diastolic dysfunction). Doppler parameters are consistent with elevated ventricular end-diastolic filling pressure. - Aortic valve: Cusp separation was mildly reduced. There was moderate stenosis. Valve area: 1.25cm^2(VTI). Valve area: 1.1cm^2 (Vmax). - Mitral valve: Calcified annulus. Mildly thickened leaflets . Transvalvular velocity was within the normal range. There was no evidence for stenosis. No regurgitation. - Left atrium: The atrium was severely dilated. - Right ventricle: The cavity size was mildly dilated. Wall thickness was normal. Systolic function was normal. - Right atrium: The atrium was moderately dilated. - Tricuspid valve: Mild regurgitation. - Pulmonary arteries: Systolic pressure was within the normal range. - Inferior vena cava: The vessel was dilated; the respirophasic diameter changes were blunted (< 50%); findings are consistent with elevated central venous pressure. - Impressions: When compared to the prior study from 2014 left ventricle is now midly dilated with elevated filling pressures. LVEF is the same estimated at 40-45%. Aortic stenosis is unchanged estimated  as moderate with mean gradient 24 mmHg. Impressions:  - When compared to the prior study from 2014 left ventricle is now midly dilated with elevated filling pressures. LVEF is the same estimated at 40-45%. Aortic stenosis is unchanged estimated as moderate with mean gradient 24 mmHg.   BNP    Component Value Date/Time   PROBNP 3487.0* 08/11/2013 2006    Education Assessment and Provision:  Detailed education  and instructions provided on heart failure disease management including the following:  Signs and symptoms of Heart Failure When to call the physician Importance of daily weights Low sodium diet Fluid restriction Medication management Anticipated future follow-up appointments  Patient education given on each of the above topics.  Patient acknowledges understanding and acceptance of all instructions.  I recall Jonathan Acevedo from prior admission.  He is able to teach back all topics described above.  He admits that he struggles with "finding right things to eat".  He recalls dietary intake of high sodium foods and we reviewed low sodium choices.  He frequently eats out and we discussed how challenging it is to eat out and control sodium.  He does weigh daily and relates a slow increase in weight over several weeks.  He agrees to pay more attention to his diet and I reviewed when it is appropriate to call the physician regarding his symptoms.   Education Materials:  "Living Better With Heart Failure" Booklet, Daily Weight Tracker Tool   High Risk Criteria for Readmission and/or Poor Patient Outcomes:   EF <30%- No--40-45%  2 or more admissions in 6 months- Yes  Difficult social situation- Yes-he lives with wife who has a arthritic back and stays in bed mostly  Demonstrates medication noncompliance-  No   Barriers of Care:  Knowledge of HF and compliance   Discharge Planning:   Plans to discharge to home with wife

## 2013-08-12 NOTE — Progress Notes (Signed)
Utilization Review Completed.Donne Anon T7/08/2013

## 2013-08-13 ENCOUNTER — Other Ambulatory Visit: Payer: Self-pay | Admitting: Physician Assistant

## 2013-08-13 ENCOUNTER — Telehealth: Payer: Self-pay | Admitting: Cardiology

## 2013-08-13 DIAGNOSIS — I2584 Coronary atherosclerosis due to calcified coronary lesion: Secondary | ICD-10-CM

## 2013-08-13 DIAGNOSIS — I359 Nonrheumatic aortic valve disorder, unspecified: Secondary | ICD-10-CM

## 2013-08-13 DIAGNOSIS — I10 Essential (primary) hypertension: Secondary | ICD-10-CM

## 2013-08-13 DIAGNOSIS — R079 Chest pain, unspecified: Secondary | ICD-10-CM

## 2013-08-13 DIAGNOSIS — N183 Chronic kidney disease, stage 3 unspecified: Secondary | ICD-10-CM

## 2013-08-13 DIAGNOSIS — I5033 Acute on chronic diastolic (congestive) heart failure: Secondary | ICD-10-CM

## 2013-08-13 DIAGNOSIS — I509 Heart failure, unspecified: Secondary | ICD-10-CM

## 2013-08-13 DIAGNOSIS — I5031 Acute diastolic (congestive) heart failure: Secondary | ICD-10-CM

## 2013-08-13 DIAGNOSIS — I2510 Atherosclerotic heart disease of native coronary artery without angina pectoris: Secondary | ICD-10-CM

## 2013-08-13 DIAGNOSIS — I5023 Acute on chronic systolic (congestive) heart failure: Secondary | ICD-10-CM

## 2013-08-13 DIAGNOSIS — I5043 Acute on chronic combined systolic (congestive) and diastolic (congestive) heart failure: Principal | ICD-10-CM

## 2013-08-13 LAB — BASIC METABOLIC PANEL
ANION GAP: 14 (ref 5–15)
BUN: 27 mg/dL — AB (ref 6–23)
CHLORIDE: 101 meq/L (ref 96–112)
CO2: 28 mEq/L (ref 19–32)
Calcium: 9.5 mg/dL (ref 8.4–10.5)
Creatinine, Ser: 1.64 mg/dL — ABNORMAL HIGH (ref 0.50–1.35)
GFR, EST AFRICAN AMERICAN: 43 mL/min — AB (ref >90)
GFR, EST NON AFRICAN AMERICAN: 37 mL/min — AB (ref >90)
Glucose, Bld: 95 mg/dL (ref 70–99)
POTASSIUM: 4.1 meq/L (ref 3.7–5.3)
SODIUM: 143 meq/L (ref 137–147)

## 2013-08-13 LAB — PRO B NATRIURETIC PEPTIDE: PRO B NATRI PEPTIDE: 2448 pg/mL — AB (ref 0–450)

## 2013-08-13 MED ORDER — FUROSEMIDE 40 MG PO TABS: 40.0000 mg | ORAL_TABLET | Freq: Two times a day (BID) | ORAL | Status: DC

## 2013-08-13 NOTE — Discharge Summary (Signed)
CARDIOLOGY DISCHARGE SUMMARY   Patient ID: Jonathan Acevedo MRN: [id number] DOB/AGE: 04/25/1928 80 y.o.  Admit date: 08/11/2013 Discharge date: 08/13/2013  PCP: Florina Ou, MD Primary Cardiologist: PJ  Primary Discharge Diagnosis:   Acute on chronic diastolic CHF (congestive heart failure), NYHA class 3 Secondary Discharge Diagnosis:    Chronic renal insufficiency, stage III (moderate)   Chest pain  Procedures: 2 view CXR  Hospital Course: Jonathan Acevedo is a 80 y.o. male with a history of CAD and CHF. He had gained several pounds and was more SOB. He came to the hospital where his BNP was elevated and his SOB improved with oxygen and IV Lasix. He was admitted for further evaluation and treatment.  He diuresed almost 4 L during his hospital stay. As he diuresed, his respiratory status improved. He has chronic kidney disease and his electrolytes plus renal function were followed closely during his hospital stay. His potassium did not require additional supplementation above his home dose, and his renal function did not change significantly. This will be followed as an outpatient.   He had some chest pain prior to admission. He has known CAD, but a cath was performed in April 2015, and medical therapy was recommended, so no further workup is indicated.   On 07/08, he was seen by Dr. Gwenlyn Found and all data were reviewed. He was ambulating well and no further inpatient workup is indicated. He is considered stable for discharge, to follow up as an outpatient.    Labs:  Lab Results  Component Value Date   WBC 5.5 08/12/2013   HGB 10.5* 08/12/2013   HCT 32.4* 08/12/2013   MCV 91.8 08/12/2013   PLT 175 08/12/2013     Recent Labs Lab 08/13/13 0550  NA 143  K 4.1  CL 101  CO2 28  BUN 27*  CREATININE 1.64*  CALCIUM 9.5  GLUCOSE 95    Recent Labs  08/12/13 0424 08/12/13 0920 08/12/13 1500  TROPONINI <0.30 <0.30 <0.30   Pro B Natriuretic peptide (BNP)  Date/Time Value Ref Range  Status  08/13/2013  5:50 AM 2448.0* 0 - 450 pg/mL Final  08/11/2013  8:06 PM 3487.0* 0 - 450 pg/mL Final     Radiology: Dg Chest 2 View 08/11/2013   CLINICAL DATA:  Chest pain.  Previous myocardial infarct.  EXAM: CHEST  2 VIEW  COMPARISON:  05/25/13  FINDINGS: Mild cardiomegaly stable. Mild scarring again seen at the left lung base. No evidence of pulmonary infiltrate or edema. No evidence of pleural effusion. Ectasia thoracic aorta is stable.  IMPRESSION: Stable mild cardiomegaly and left basilar scarring. No active disease.   Electronically Signed   By: Earle Gell M.D.   On: 08/11/2013 21:04   EKG: 08/12/2013 SR w/ PVCs and pairs Vent. rate 72 BPM PR interval 178 ms QRS duration 102 ms QT/QTc 460/503 ms P-R-T axes 42 -58 57  FOLLOW UP PLANS AND APPOINTMENTS Allergies  Allergen Reactions  . Ibandronic Acid Other (See Comments)    Muscle Aches-pt denies  . Risedronate Sodium Other (See Comments)    Muscle aches     Medication List         aspirin 325 MG EC tablet  Take 325 mg by mouth every morning.     furosemide 40 MG tablet  Commonly known as:  LASIX  Take 1 tablet (40 mg total) by mouth 2 (two) times daily.     HYDROcodone-acetaminophen 5-325 MG per tablet  Commonly known  as:  NORCO/VICODIN  Take 1 tablet by mouth every 6 (six) hours as needed for moderate pain.     nitroGLYCERIN 0.4 MG SL tablet  Commonly known as:  NITROSTAT  Place 1 tablet (0.4 mg total) under the tongue every 5 (five) minutes as needed for chest pain.     oyster calcium 500 MG Tabs tablet  Take 500 mg of elemental calcium by mouth daily.     potassium chloride SA 20 MEQ tablet  Commonly known as:  K-DUR,KLOR-CON  Take 1 tablet (20 mEq total) by mouth daily.     SM IRON 325 (65 FE) MG tablet  Generic drug:  ferrous sulfate  Take 325 mg by mouth daily.     VITAMIN D PO  Take 1 tablet by mouth daily.     VYTORIN 10-20 MG per tablet  Generic drug:  ezetimibe-simvastatin  Take 1 tablet by  mouth daily.        Discharge Instructions   (HEART FAILURE PATIENTS) Call MD:  Anytime you have any of the following symptoms: 1) 3 pound weight gain in 24 hours or 5 pounds in 1 week 2) shortness of breath, with or without a dry hacking cough 3) swelling in the hands, feet or stomach 4) if you have to sleep on extra pillows at night in order to breathe.    Complete by:  As directed      Diet - low sodium heart healthy    Complete by:  As directed      Increase activity slowly    Complete by:  As directed           Follow-up Information   Follow up with [facility], NP On 08/28/2013. (Lab work and see for Dr. Martinique at 4:00 pm)    Specialty:  Cardiology   Contact information:   [address] [place] [address] [phone]       Follow up with [name], MD On 10/03/2013. (at 09:45 am)    Specialty:  Cardiology   Contact information:   [address] [place] [address] [phone]       BRING ALL MEDICATIONS WITH YOU TO FOLLOW UP APPOINTMENTS  Time spent with patient to include physician time: 41 min Signed: Rosaria Ferries, PA-C 08/13/2013, 3:38 PM Co-Sign MD

## 2013-08-13 NOTE — Telephone Encounter (Signed)
Information placed on note section for appt.

## 2013-08-13 NOTE — Progress Notes (Signed)
Patient Name: Jonathan Acevedo Date of Encounter: 08/13/2013  Principal Problem:   Acute on chronic diastolic CHF (congestive heart failure), NYHA class 3 Active Problems:   Chronic renal insufficiency, stage III (moderate)   Chest pain    Patient Profile: 80 yr old male with history of CAD with remote MI and prior stent to the LAD ( last cath 05/2013 non obstructive disease) , HTN, CKD ( cr 1.6) Mild to mod AS ( mean grad 24 ) CMP EF 40-45%, here with chest pain and concern for fluid overload.    SUBJECTIVE: Feels much better, breathing fine. Wants to go home. No more chest pain.  OBJECTIVE Filed Vitals:   08/12/13 2112 08/13/13 0100 08/13/13 0519 08/13/13 1000  BP: 112/60 97/58 94/44  96/60  Pulse: 51 67  56  Temp: 97.6 F (36.4 C) 97.7 F (36.5 C) 98.1 F (36.7 C) 97.7 F (36.5 C)  TempSrc: Oral Oral Oral Oral  Resp: 18 18 18 20   Height:      Weight:   191 lb 14.4 oz (87.045 kg)   SpO2: 98% 97% 99% 100%    Intake/Output Summary (Last 24 hours) at 08/13/13 1448 Last data filed at 08/13/13 0900  Gross per 24 hour  Intake    720 ml  Output   2270 ml  Net  -1550 ml   Filed Weights   08/12/13 0109 08/12/13 0624 08/13/13 0519  Weight: 199 lb 12.8 oz (90.629 kg) 198 lb 1.6 oz (89.858 kg) 191 lb 14.4 oz (87.045 kg)    PHYSICAL EXAM General: Well developed, well nourished, male in no acute distress. Head: Normocephalic, atraumatic.  Neck: Supple without bruits, JVD minimally elevated. Lungs:  Resp regular and unlabored, CTA. Heart: RRR, S1, S2, no S3, S4, 3/6 murmur; no rub. Abdomen: Soft, non-tender, non-distended, BS + x 4.  Extremities: No clubbing, cyanosis, no edema.  Neuro: Alert and oriented X 3. Moves all extremities spontaneously. Psych: Normal affect.  LABS: CBC:  Recent Labs  08/11/13 2006 08/12/13 0424  WBC 6.6 5.5  HGB 11.7* 10.5*  HCT 35.5* 32.4*  MCV 92.4 91.8  PLT 192 409   Basic Metabolic Panel:  Recent Labs  08/11/13 2006  08/12/13 0424 08/13/13 0550  NA 143  --  143  K 4.2  --  4.1  CL 104  --  101  CO2 26  --  28  GLUCOSE 102*  --  95  BUN 27*  --  27*  CREATININE 1.61* 1.72* 1.64*  CALCIUM 9.1  --  9.5   Cardiac Enzymes:  Recent Labs  08/12/13 0424 08/12/13 0920 08/12/13 1500  TROPONINI <0.30 <0.30 <0.30    Recent Labs  08/11/13 2010  TROPIPOC 0.02   BNP: Pro B Natriuretic peptide (BNP)  Date/Time Value Ref Range Status  08/13/2013  5:50 AM 2448.0* 0 - 450 pg/mL Final  08/11/2013  8:06 PM 3487.0* 0 - 450 pg/mL Final   TELE:  SR, PVCs       Radiology/Studies: Dg Chest 2 View 08/11/2013   CLINICAL DATA:  Chest pain.  Previous myocardial infarct.  EXAM: CHEST  2 VIEW  COMPARISON:  05/25/13  FINDINGS: Mild cardiomegaly stable. Mild scarring again seen at the left lung base. No evidence of pulmonary infiltrate or edema. No evidence of pleural effusion. Ectasia thoracic aorta is stable.  IMPRESSION: Stable mild cardiomegaly and left basilar scarring. No active disease.   Electronically Signed   By: Sharrie Rothman.D.  On: 08/11/2013 21:04     Current Medications:  . aspirin EC  81 mg Oral Daily  . calcium carbonate  500 mg of elemental calcium Oral Daily  . ezetimibe-simvastatin  1 tablet Oral Daily  . ferrous sulfate  325 mg Oral Daily  . furosemide  40 mg Intravenous BID  . heparin  5,000 Units Subcutaneous 3 times per day  . potassium chloride SA  20 mEq Oral Daily  . sodium chloride  3 mL Intravenous Q12H      ASSESSMENT AND PLAN: Principal Problem:   Acute on chronic diastolic CHF (congestive heart failure), NYHA class 3 - weight not down that much but I/O negative by almost 4L and pt breathing much better. He feels that 190 is his dry weight and reinforced the need to maintain it, or call if trending up. Feels he only gained about 4 lbs before becoming symptomatic and coming back in.   Active Problems:   Chronic renal insufficiency, stage III (moderate) - follow as OP    Chest  pain - known CAD, ez negative for MI, medical therapy recommended. EF 40%.    Hx syncope - no significant arrhythmia, s/p ILR, f/u as scheduled.  Plan - d/c today.    Signed, Rosaria Ferries , PA-C 2:48 PM 08/13/2013  Agree with note by Rosaria Ferries PA-C  Pt admitted with CHF. ISCM. Good diuresis. Clinically improved. Lungs clear. Cor RRR. No periph edema. OK for DC home. ROV with Dr. Martinique  Bretta Fees J. Yashas Camilli, M.D., [place], [facility], [place]ta [place] [address] [place], [place]  [address]  [phone] 08/13/2013 3:10 PM

## 2013-08-13 NOTE — Progress Notes (Signed)
Nutrition Education Note  RD consulted for nutrition education regarding new onset CHF. Pt states he knows what to do but, he had been maintaining his weight so well that he started "cheating" and before her knew it his weight had gone up rapidly. Pt denies any additional education needs at this time.  RD provided "Low Sodium Nutrition Therapy" handout as well as low sodium sample menus from the Academy of [employer]. Encouraged fresh fruits and vegetables as well as whole grain sources of carbohydrates to maximize fiber intake. Discussed tips for dining out.   RD briefly reviewed why it is important for patient to adhere to diet recommendations, and emphasized the role of fluids, foods to avoid, and importance of weighing self daily.  Expect good compliance.  Body mass index is 27.53 kg/(m^2). Pt meets criteria for Overweight based on current BMI.  Current diet order is 2 Gram Sodium, patient is consuming approximately 50-100% of meals at this time. Labs and medications reviewed. No further nutrition interventions warranted at this time. RD contact information provided. If additional nutrition issues arise, please re-consult RD.   Pryor Ochoa RD, LDN Inpatient Clinical Dietitian Pager: [phone] After Hours Pager: [phone]

## 2013-08-13 NOTE — Telephone Encounter (Signed)
Jonathan Acevedo called and said he needs a BMET the same he has an appt  With [name].

## 2013-08-14 ENCOUNTER — Telehealth: Payer: Self-pay | Admitting: Cardiology

## 2013-08-14 NOTE — Telephone Encounter (Signed)
I would reduce lasix to 40 mg in the am and 20 mg in the pm. Hold flomax.  Gazelle Towe Martinique MD, [facility]

## 2013-08-14 NOTE — Telephone Encounter (Signed)
Returned call to patient he stated he was admitted to [facility] hospital 08/11/13 and discharged 08/13/13.Stated he is concerned B/P is low,ranging 101/56,102/59,112/61,99/60,119/70,96/58,94/54,96/59.Stated he walked out to his mail box and got swimmy headed.Stated he also has noticed when he lies down his breathing is labored.No swelling noticed in feet and legs. Patient stated lasix increased to 40 mg twice a day.Stated he has been urinating less since the increase.Also stated PA said he should be taking flomax.Stated he has not started back on flomax because that is what started his problem.Message sent to [place] for advice.

## 2013-08-14 NOTE — Telephone Encounter (Signed)
Returned call to patient Dr.Jordan advised to decrease lasix to 40 mg am and 20 mg pm.No Flomax.Advised to keep appointment with Cecilie Kicks NP 08/28/13.Advised to continue to monitor B/P and call back if needed.

## 2013-08-14 NOTE — Telephone Encounter (Signed)
Please call,his heart rate down,his breathing is very labored,and blood pressure is down also.

## 2013-08-14 NOTE — Addendum Note (Signed)
Addended by: Golden Hurter D on: 08/14/2013 02:38 PM   Modules accepted: Orders, Medications

## 2013-08-15 ENCOUNTER — Telehealth: Payer: Self-pay | Admitting: Cardiology

## 2013-08-15 NOTE — Telephone Encounter (Signed)
Returned call to patient.   BP readings today:  11am - at rest 104/58 116/65/ 108/60 111/60 107/57  Did calisthenics.Marland Kitchen 91/51 112/55 105/52  HR stable during rest and exerciser averaging mid-50s  Patient denies lightheadedness/dizziness. No complaints reports he has cut wayyy back on sodium intake.   Would like to know what to do about BP Please call to advise. He said information can be left on his home voice mail if he does not answer.   Message deferred to [facility], LPN and Dr. Martinique to advise.

## 2013-08-15 NOTE — Telephone Encounter (Signed)
Spoke to patient advised to continue furosemide 40 mg am and 20 mg pm.Patient stated he feels good.Patient wanted to know if his B/P drops under 063 systolic over the weekend what should he do.Advised if B/P less than 016 systolic to hold pm dose of furosemide.Advised to continue to monitor B/P and call back if needed.

## 2013-08-15 NOTE — Telephone Encounter (Signed)
Patient is calling with his most recent blood pressure reading.

## 2013-08-19 NOTE — Telephone Encounter (Signed)
Spoke to patient he stated he is doing good.Stated B/P ranging 120/62,116/52,100/53,110/61.Pulse ranging 54,43,53,56 bpm.Stated he is taking lasix 40 mg am,20 mg pm.Dr.Jordan advised to continue lasix 40 mg am,20 mg pm.Advised to keep appointment with Dr.Jordan 10/03/13 at 9:45 am.Advised to bring B/P readings.Advised to call sooner if needed.

## 2013-08-25 NOTE — Progress Notes (Signed)
Loop recorder 

## 2013-08-28 ENCOUNTER — Ambulatory Visit (INDEPENDENT_AMBULATORY_CARE_PROVIDER_SITE_OTHER): Payer: Medicare Other | Admitting: Cardiology

## 2013-08-28 ENCOUNTER — Encounter: Payer: Self-pay | Admitting: Cardiology

## 2013-08-28 VITALS — BP 126/70 | HR 53 | Ht 70.0 in | Wt 192.0 lb

## 2013-08-28 DIAGNOSIS — I509 Heart failure, unspecified: Secondary | ICD-10-CM

## 2013-08-28 DIAGNOSIS — I350 Nonrheumatic aortic (valve) stenosis: Secondary | ICD-10-CM

## 2013-08-28 DIAGNOSIS — N183 Chronic kidney disease, stage 3 unspecified: Secondary | ICD-10-CM

## 2013-08-28 DIAGNOSIS — I2510 Atherosclerotic heart disease of native coronary artery without angina pectoris: Secondary | ICD-10-CM

## 2013-08-28 DIAGNOSIS — I10 Essential (primary) hypertension: Secondary | ICD-10-CM

## 2013-08-28 DIAGNOSIS — Z79899 Other long term (current) drug therapy: Secondary | ICD-10-CM

## 2013-08-28 DIAGNOSIS — I5033 Acute on chronic diastolic (congestive) heart failure: Secondary | ICD-10-CM

## 2013-08-28 DIAGNOSIS — R0789 Other chest pain: Secondary | ICD-10-CM

## 2013-08-28 DIAGNOSIS — R55 Syncope and collapse: Secondary | ICD-10-CM

## 2013-08-28 DIAGNOSIS — I359 Nonrheumatic aortic valve disorder, unspecified: Secondary | ICD-10-CM

## 2013-08-28 NOTE — Assessment & Plan Note (Signed)
controlled 

## 2013-08-28 NOTE — Progress Notes (Signed)
08/28/2013   PCP: Florina Ou, MD   Chief Complaint  Patient presents with  . Follow-up    Post ER visit April:   No complaints of chest pain, SOB or edema.  Occas. dizziness if he gets up too fast. States he occas. feels jittery at night with labored breathing  should he take an extra 20mg  lasix at night.    Primary Cardiologist: Dr. P. Martinique   HPI:  80 year old male with a history of CAD with remote MI and prior stent to the LAD, HTN, CKD, AS and HLD. He was hospitalized briefly in early April of 2014 with chest pain. A subsequent stress Myoview study showed extensive scar of the anterior apex and inferior wall. Ejection fraction is 44%. This actually represented an improvement from echocardiogram in 2012 at which time his ejection fraction was 35-40%. In Ocober 2014 he presented with a prolonged episode of chest pain. Echo showed no change in his AS - medical management was continued.  In April 2015 he presented with syncope that resulted in a fractured right ankle and acute systolic HF - was diuresed. He had cath, EP study and had loop recorder placed. Had had significant sodium indiscretion prior to this admission and had taken a dose of Flomax on his own. Off of the ACE due to CKD.  Cath revealed nonobstructive CAD, mod to severe LV dysfunction--EF 35040%, mild to moderate aortic stenosis. EP study with no inducible VT or SVT despite an aggressive protocol programed atrial and ventricular stimulation as well as rapid atrial and ventricular pacing.  Recent hospitalization 08/11/13 to 08/13/13 for acute on chronic diastolic HF, also with CKD 3, he also ad chest pain that was brief.  He was diuresed 4 L.  Today he presents back for follow up.  No acute problems.  He has been following diet, watching NA and is back to swimming not quite up to his goal but approaching 30 laps.  His home wt. is stable.  He weighs daily. If his wt is up or if he overindulges in salt he takes an extra  20 mg lasix.    On his list of wts and BP his HR is 44 on occ.  He has occ dizziness. No further syncope.   He has downloaded his loop- we called for results.  No chest pain.    Allergies  Allergen Reactions  . Ibandronic Acid Other (See Comments)    Muscle Aches-pt denies  . Risedronate Sodium Other (See Comments)    Muscle aches    Current Outpatient Prescriptions  Medication Sig Dispense Refill  . aspirin 325 MG EC tablet Take 325 mg by mouth every morning.       . Cholecalciferol (VITAMIN D PO) Take 1 tablet by mouth daily.       Marland Kitchen ezetimibe-simvastatin (VYTORIN) 10-20 MG per tablet Take 1 tablet by mouth daily.  30 tablet  6  . ferrous sulfate (SM IRON) 325 (65 FE) MG tablet Take 325 mg by mouth daily.      . furosemide (LASIX) 40 MG tablet Take 40 mg am and 20 mg pm.  60 tablet  6  . HYDROcodone-acetaminophen (NORCO) 7.5-325 MG per tablet Take 1 tablet by mouth every 6 (six) hours as needed for moderate pain.      . nitroGLYCERIN (NITROSTAT) 0.4 MG SL tablet Place 1 tablet (0.4 mg total) under the tongue every 5 (five) minutes as needed for  chest pain.  25 tablet  3  . Oyster Shell (OYSTER CALCIUM) 500 MG TABS Take 500 mg of elemental calcium by mouth daily.       . potassium chloride SA (K-DUR,KLOR-CON) 20 MEQ tablet Take 1 tablet (20 mEq total) by mouth daily.  30 tablet  11   No current facility-administered medications for this visit.    Past Medical History  Diagnosis Date  . Coronary artery disease     w remote anterior myocardial infarction (1998)  . Hypertension   . Renal insufficiency   . Aortic stenosis   . Rectal polyp   . Hypercholesteremia   . Kidney stones   . 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 Hypercalcemia 12/26/2011  . Afib   . Syncope 05/18/2013    Past Surgical History  Procedure Laterality Date  . Cardiac catheterization  08/18/2008    LMain 20, LAD stent 20 ISR, CFX 10, RCA 20, EF 45  . Umbilical hernia repair    . Inguinal hernia repair       left  . Orif femur fracture      right  . Cataract extraction    . Coronary stent placement  07/19/1996    Proximal LAD  . Parathyroidectomy    . Loop recorder implant  05-19-13    MDT LinQ implanted by Dr Lovena Le for syncope following negative EPS  . Electrophysiology study  05-19-13    EPS for syncope without inducible arrhythmias by Dr Lovena Le    GUY:QIHKVQQ:VZ colds or fevers, + weight changes from June and stable from hospital D/C 08/13/13 Skin:no rashes or ulcers HEENT:no blurred vision, no congestion CV:see HPI PUL:see HPI GI:no diarrhea constipation or melena, no indigestion GU:no hematuria, no dysuria MS:no joint pain, no claudication Neuro:no syncope, no lightheadedness Endo:no diabetes, no thyroid disease  Wt Readings from Last 3 Encounters:  08/28/13 192 lb (87.091 kg)  08/13/13 191 lb 14.4 oz (87.045 kg)  07/07/13 203 lb (92.08 kg)   Loop Recorder Download: freq PVCs- couplets on 08/10/13  PHYSICAL EXAM BP 126/70  Pulse 53  Ht 5\' 10"  (1.778 m)  Wt 192 lb (87.091 kg)  BMI 27.55 kg/m2 General:Pleasant affect, NAD Skin:Warm and dry, brisk capillary refill HEENT:normocephalic, sclera clear, mucus membranes moist Neck:supple, no JVD, soft carotid bruits  Heart:S1S2 RRR with 3/6 aortic outflow murmur no radiation to back or abd., harsh, gallup, rub or click Lungs:clear without rales, rhonchi, or wheezes DGL:OVFI, non tender, + BS, do not palpate liver spleen or masses Ext:tr lower ext edema, 2+ pedal pulses, 2+ radial pulses Neuro:alert and oriented, MAE, follows commands, + facial symmetry  EKG:S Brady rate 53, LAFB, no acute changes fromf previous.  ASSESSMENT AND PLAN Acute on chronic diastolic CHF (congestive heart failure), NYHA class 3 Most likely combination of diastolic and systolic HF, improved today.  Wt stable watching diet approp.  Continue current meds with extra lasix 20 mg prn for too much salt intake for a day or wt up 3 pounds,  He also knows to  call us as well.  Follow up with Dr. P. Martinique in August.   Chest pain No further episodes  Aortic stenosis Murmur present.  stable  Coronary artery disease Stable cath 05/2013  Syncope Negative EP study 05/2013 and loop recorder in place.  He will be seen by device staff in near future to review sending in loop results.   Hypertension controlled

## 2013-08-28 NOTE — Patient Instructions (Signed)
Have lab work done at [facility].  Keep appointment with Dr. Martinique in August.

## 2013-08-28 NOTE — Assessment & Plan Note (Addendum)
Negative EP study 05/2013 and loop recorder in place.  He will be seen by device staff in near future to review sending in loop results.

## 2013-08-28 NOTE — Assessment & Plan Note (Signed)
Stable cath 05/2013

## 2013-08-28 NOTE — Assessment & Plan Note (Signed)
Murmur present.  stable

## 2013-08-28 NOTE — Assessment & Plan Note (Signed)
No further episodes

## 2013-08-28 NOTE — Assessment & Plan Note (Signed)
Most likely combination of diastolic and systolic HF, improved today.  Wt stable watching diet approp.  Continue current meds with extra lasix 20 mg prn for too much salt intake for a day or wt up 3 pounds,  He also knows to call us as well.  Follow up with Dr. P. Martinique in August.

## 2013-08-29 ENCOUNTER — Telehealth: Payer: Self-pay | Admitting: Cardiology

## 2013-08-29 LAB — BASIC METABOLIC PANEL
BUN: 36 mg/dL — ABNORMAL HIGH (ref 6–23)
CO2: 29 mEq/L (ref 19–32)
Calcium: 9.7 mg/dL (ref 8.4–10.5)
Chloride: 102 mEq/L (ref 96–112)
Creat: 1.71 mg/dL — ABNORMAL HIGH (ref 0.50–1.35)
Glucose, Bld: 94 mg/dL (ref 70–99)
Potassium: 4.9 mEq/L (ref 3.5–5.3)
SODIUM: 141 meq/L (ref 135–145)

## 2013-08-29 NOTE — Telephone Encounter (Signed)
Returning your call,concerning his lab results. °

## 2013-08-29 NOTE — Telephone Encounter (Signed)
Spoke with pt and informed pt that loop recorder home monitor had been deactivated and requested that pt send a manual transmission to reactivate monitor. Pt verbalized understanding.

## 2013-09-01 NOTE — Telephone Encounter (Signed)
Message copied by Raiford Simmonds on Mon Sep 01, 2013  9:15 AM ------      Message from: Jonathan Acevedo      Created: Fri Aug 29, 2013  8:22 AM       Please let pt know that labs are stable, no changes ------

## 2013-09-01 NOTE — Telephone Encounter (Signed)
Left message on voicemail and MY Chart

## 2013-09-18 ENCOUNTER — Telehealth: Payer: Self-pay | Admitting: Cardiology

## 2013-09-18 NOTE — Telephone Encounter (Signed)
New Prob     Pt has some some questions regarding one of his medications. Please call.

## 2013-09-18 NOTE — Telephone Encounter (Signed)
Returned call to patient no answer.LMTC. 

## 2013-09-18 NOTE — Telephone Encounter (Signed)
Received call from patient.He stated he is doing good.Stated he has lost weight and is eating right.Stated he has changed mail order pharmacies.Stated he will call me back when he needs 90 day prescription sent to new pharmacy.

## 2013-09-20 ENCOUNTER — Encounter (HOSPITAL_COMMUNITY): Payer: Self-pay | Admitting: Emergency Medicine

## 2013-09-20 ENCOUNTER — Emergency Department (HOSPITAL_COMMUNITY)
Admission: EM | Admit: 2013-09-20 | Discharge: 2013-09-20 | Disposition: A | Discharge status: Home / Self Care | Payer: Medicare Other | Attending: Emergency Medicine | Admitting: Emergency Medicine

## 2013-09-20 ENCOUNTER — Emergency Department (HOSPITAL_COMMUNITY): Payer: Medicare Other

## 2013-09-20 DIAGNOSIS — Z7982 Long term (current) use of aspirin: Secondary | ICD-10-CM | POA: Insufficient documentation

## 2013-09-20 DIAGNOSIS — Z79899 Other long term (current) drug therapy: Secondary | ICD-10-CM | POA: Diagnosis not present

## 2013-09-20 DIAGNOSIS — I129 Hypertensive chronic kidney disease with stage 1 through stage 4 chronic kidney disease, or unspecified chronic kidney disease: Secondary | ICD-10-CM | POA: Diagnosis not present

## 2013-09-20 DIAGNOSIS — Z87891 Personal history of nicotine dependence: Secondary | ICD-10-CM | POA: Diagnosis not present

## 2013-09-20 DIAGNOSIS — N183 Chronic kidney disease, stage 3 unspecified: Secondary | ICD-10-CM | POA: Diagnosis not present

## 2013-09-20 DIAGNOSIS — Z9861 Coronary angioplasty status: Secondary | ICD-10-CM | POA: Diagnosis not present

## 2013-09-20 DIAGNOSIS — R079 Chest pain, unspecified: Secondary | ICD-10-CM | POA: Diagnosis present

## 2013-09-20 DIAGNOSIS — I2589 Other forms of chronic ischemic heart disease: Secondary | ICD-10-CM | POA: Diagnosis not present

## 2013-09-20 DIAGNOSIS — I2510 Atherosclerotic heart disease of native coronary artery without angina pectoris: Secondary | ICD-10-CM | POA: Insufficient documentation

## 2013-09-20 DIAGNOSIS — I255 Ischemic cardiomyopathy: Secondary | ICD-10-CM

## 2013-09-20 LAB — BASIC METABOLIC PANEL
Anion gap: 14 (ref 5–15)
BUN: 42 mg/dL — AB (ref 6–23)
CO2: 24 mEq/L (ref 19–32)
CREATININE: 1.89 mg/dL — AB (ref 0.50–1.35)
Calcium: 9.3 mg/dL (ref 8.4–10.5)
Chloride: 102 mEq/L (ref 96–112)
GFR calc Af Amer: 36 mL/min — ABNORMAL LOW (ref >90)
GFR calc non Af Amer: 31 mL/min — ABNORMAL LOW (ref >90)
Glucose, Bld: 120 mg/dL — ABNORMAL HIGH (ref 70–99)
Potassium: 4.2 mEq/L (ref 3.7–5.3)
Sodium: 140 mEq/L (ref 137–147)

## 2013-09-20 LAB — I-STAT TROPONIN, ED: Troponin i, poc: 0.02 ng/mL (ref 0.00–0.08)

## 2013-09-20 LAB — CBC
HCT: 36.8 % — ABNORMAL LOW (ref 39.0–52.0)
Hemoglobin: 12.1 g/dL — ABNORMAL LOW (ref 13.0–17.0)
MCH: 30.6 pg (ref 26.0–34.0)
MCHC: 32.9 g/dL (ref 30.0–36.0)
MCV: 93.2 fL (ref 78.0–100.0)
Platelets: 153 10*3/uL (ref 150–400)
RBC: 3.95 MIL/uL — AB (ref 4.22–5.81)
RDW: 14.2 % (ref 11.5–15.5)
WBC: 6.2 10*3/uL (ref 4.0–10.5)

## 2013-09-20 LAB — TROPONIN I: Troponin I: <0.3 ng/mL (ref <0.30)

## 2013-09-20 NOTE — Discharge Instructions (Signed)
Return to the ER for worsening condition or new concerning symptoms.  Continue taking current medications.  Please contact your cardiologist office on Monday for close follow up.   Chest Pain Observation It is often hard to give a specific diagnosis for the cause of chest pain. Among other possibilities your symptoms might be caused by inadequate oxygen delivery to your heart (angina). Angina that is not treated or evaluated can lead to a heart attack (myocardial infarction) or death. Blood tests, electrocardiograms, and X-rays may have been done to help determine a possible cause of your chest pain. After evaluation and observation, your health care provider has determined that it is unlikely your pain was caused by an unstable condition that requires hospitalization. However, a full evaluation of your pain may need to be completed, with additional diagnostic testing as directed. It is very important to keep your follow-up appointments. Not keeping your follow-up appointments could result in permanent heart damage, disability, or death. If there is any problem keeping your follow-up appointments, you must call your health care provider. HOME CARE INSTRUCTIONS  Due to the slight chance that your pain could be angina, it is important to follow your health care provider's treatment plan and also maintain a healthy lifestyle:  Maintain or work toward achieving a healthy weight.  Stay physically active and exercise regularly.  Decrease your salt intake.  Eat a balanced, healthy diet. Talk to a dietitian to learn about heart-healthy foods.  Increase your fiber intake by including whole grains, vegetables, fruits, and nuts in your diet.  Avoid situations that cause stress, anger, or depression.  Take medicines as advised by your health care provider. Report any side effects to your health care provider. Do not stop medicines or adjust the dosages on your own.  Quit smoking. Do not use nicotine  patches or gum until you check with your health care provider.  Keep your blood pressure, blood sugar, and cholesterol levels within normal limits.  Limit alcohol intake to no more than 1 drink per day for women who are not pregnant and 2 drinks per day for men.  Do not abuse drugs. SEEK IMMEDIATE MEDICAL CARE IF: You have severe chest pain or pressure which may include symptoms such as:  You feel pain or pressure in your arms, neck, jaw, or back.  You have severe back or abdominal pain, feel sick to your stomach (nauseous), or throw up (vomit).  You are sweating profusely.  You are having a fast or irregular heartbeat.  You feel short of breath while at rest.  You notice increasing shortness of breath during rest, sleep, or with activity.  You have chest pain that does not get better after rest or after taking your usual medicine.  You wake from sleep with chest pain.  You are unable to sleep because you cannot breathe.  You develop a frequent cough or you are coughing up blood.  You feel dizzy, faint, or experience extreme fatigue.  You develop severe weakness, dizziness, fainting, or chills. Any of these symptoms may represent a serious problem that is an emergency. Do not wait to see if the symptoms will go away. Call your local emergency services (911 in the [place] Do not drive yourself to the hospital. MAKE SURE YOU:  Understand these instructions.  Will watch your condition.  Will get help right away if you are not doing well or get worse. Document Released: 02/25/2010 Document Revised: 01/28/2013 Document Reviewed: 07/25/2012 [facility] Patient Information 2015 [place], [place].  This information is not intended to replace advice given to you by your health care provider. Make sure you discuss any questions you have with your health care provider. ° °

## 2013-09-20 NOTE — ED Notes (Signed)
Patient began having sudden central chest pain non-radiating that he described as a dull 7/10 pain. Patient took 324 asa and SL nitro x2 and pain is now 0/10. Denies SOB, N/V, Diaphoresis. Hx of MI this year. Patient having couplets and triplets on monitor of which the triplets are not conducting.

## 2013-09-20 NOTE — ED Provider Notes (Signed)
CSN: [id number]     Arrival date & time 09/20/13  9892 History   First MD Initiated Contact with Patient 09/20/13 [phone]     Chief Complaint  Patient presents with  . Chest Pain     (Consider location/radiation/quality/duration/timing/severity/associated sxs/prior Treatment) HPI 80 year old male presents to emergency department from home with complaint of chest pain.  He reports pain came on this morning around 2 AM while he was taking a shower.  Patient reports he took 3 baby aspirin and a nitroglycerin.  He called 911.  After receiving second nitroglycerin, patient reports resolution of pain.  Patient has history of coronary disease status post MI.  His last heart catheterization was in April and required no further intervention.  He has history of hypertension, renal insufficiency aortic stenosis.  Patient has a loop recorder implanted secondary to syncope.  He has history of arrhythmia.  Patient reports all symptoms have resolved.  Patient initially rhythm with bigeminy and trigeminy with bradycardia.  Rhythm has since gone back into a sinus bradycardia rhythm in the 50s.  He denies any shortness of breath diaphoresis or nausea with chest pain earlier.  Patient reports he's been compliant with his low salt diet.  He reports that he has dropped his weight from 208 down to 190.  He reports that he is having no lower extremity swelling.  He denies or any orthopnea or dyspnea on exertion. Past Medical History  Diagnosis Date  . Coronary artery disease     w remote anterior myocardial infarction (1998)  . Hypertension   . Renal insufficiency   . Aortic stenosis   . Rectal polyp   . Hypercholesteremia   . Kidney stones   . 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 Hypercalcemia 12/26/2011  . Afib   . Syncope 05/18/2013   Past Surgical History  Procedure Laterality Date  . Cardiac catheterization  08/18/2008    LMain 20, LAD stent 20 ISR, CFX 10, RCA 20, EF 45  . Umbilical hernia repair    .  Inguinal hernia repair      left  . Orif femur fracture      right  . Cataract extraction    . Coronary stent placement  07/19/1996    Proximal LAD  . Parathyroidectomy    . Loop recorder implant  05-19-13    MDT LinQ implanted by Dr Lovena Le for syncope following negative EPS  . Electrophysiology study  05-19-13    EPS for syncope without inducible arrhythmias by Dr Lovena Le   No family history on file. History  Substance Use Topics  . Smoking status: Former Smoker -- 1.00 packs/day for 45 years    Types: Cigarettes    Quit date: 07/19/1996  . Smokeless tobacco: Never Used  . Alcohol Use: No    Review of Systems   See History of Present Illness; otherwise all other systems are reviewed and negative  Allergies  Ibandronic acid and Risedronate sodium  Home Medications   Prior to Admission medications   Medication Sig Start Date End Date Taking? Authorizing Provider  aspirin 325 MG EC tablet Take 325 mg by mouth every morning.     Historical Provider, MD  Cholecalciferol (VITAMIN D PO) Take 1 tablet by mouth daily.     Historical Provider, MD  ezetimibe-simvastatin (VYTORIN) 10-20 MG per tablet Take 1 tablet by mouth daily. 05/07/13   Peter M Martinique, MD  ferrous sulfate (SM IRON) 325 (65 FE) MG tablet Take 325 mg by mouth daily.  Historical Provider, MD  furosemide (LASIX) 40 MG tablet Take 40 mg am and 20 mg pm. 08/14/13   Peter M Martinique, MD  HYDROcodone-acetaminophen [facility]) 7.5-325 MG per tablet Take 1 tablet by mouth every 6 (six) hours as needed for moderate pain.    Historical Provider, MD  nitroGLYCERIN (NITROSTAT) 0.4 MG SL tablet Place 1 tablet (0.4 mg total) under the tongue every 5 (five) minutes as needed for chest pain. 12/26/12   Peter M Martinique, MD  Oyster Shell (OYSTER CALCIUM) 500 MG TABS Take 500 mg of elemental calcium by mouth daily.     Historical Provider, MD  potassium chloride SA (K-DUR,KLOR-CON) 20 MEQ tablet Take 1 tablet (20 mEq total) by mouth daily. 05/27/13    Rhonda G Barrett, PA-C   BP 119/65  Pulse 59  Temp(Src) 98.1 F (36.7 C) (Oral)  Resp 14  SpO2 97% Physical Exam  Nursing note and vitals reviewed. Constitutional: He is oriented to person, place, and time. He appears well-developed and well-nourished. No distress.  HENT:  Head: Normocephalic and atraumatic.  Nose: Nose normal.  Mouth/Throat: Oropharynx is clear and moist.  Eyes: Conjunctivae and EOM are normal. Pupils are equal, round, and reactive to light.  Neck: Normal range of motion. Neck supple. No JVD present. No tracheal deviation present. No thyromegaly present.  Cardiovascular: Normal rate, regular rhythm and intact distal pulses.  Exam reveals no gallop and no friction rub.   Murmur heard. Pulmonary/Chest: Effort normal and breath sounds normal. No stridor. No respiratory distress. He has no wheezes. He has no rales. He exhibits no tenderness.  Abdominal: Soft. Bowel sounds are normal. He exhibits no distension and no mass. There is no tenderness. There is no rebound and no guarding.  Musculoskeletal: Normal range of motion. He exhibits no edema and no tenderness.  Lymphadenopathy:    He has no cervical adenopathy.  Neurological: He is alert and oriented to person, place, and time. He exhibits normal muscle tone. Coordination normal.  Skin: Skin is warm and dry. No rash noted. No erythema. No pallor.  Psychiatric: He has a normal mood and affect. His behavior is normal. Judgment and thought content normal.    ED Course  Procedures (including critical care time) Labs Review Labs Reviewed  CBC - Abnormal; Notable for the following:    RBC 3.95 (*)    Hemoglobin 12.1 (*)    HCT 36.8 (*)    All other components within normal limits  BASIC METABOLIC PANEL - Abnormal; Notable for the following:    Glucose, Bld 120 (*)    BUN 42 (*)    Creatinine, Ser 1.89 (*)    GFR calc non Af Amer 31 (*)    GFR calc Af Amer 36 (*)    All other components within normal limits   TROPONIN I  TROPONIN I  Randolm Idol, ED    Imaging Review Dg Chest Port 1 View  09/20/2013   CLINICAL DATA:  Acute onset chest pain.  EXAM: PORTABLE CHEST - 1 VIEW  COMPARISON:  Chest radiograph August 11, 2013  FINDINGS: The cardiac silhouette remains mildly enlarged. Tortuous mildly calcified aorta. Low inspiratory examination with crowded vascular markings. Similar linear in densities in left lung base. No pleural effusions. No focal consolidations. No pneumothorax. Battery pack/heart monitor in place.  Acromioclavicular osteoarthrosis. Soft tissue planes are nonsuspicious.  IMPRESSION: Mild cardiomegaly, left lung base scarring without superimposed pulmonary process.   Electronically Signed   By: Elon Alas  On: 09/20/2013 03:39     EKG Interpretation None      MDM   Final diagnoses:  Chest pain, unspecified chest pain type  Ischemic cardiomyopathy  Chronic renal insufficiency, stage III (moderate)    80 year old male with chest pain history of coronary disease, recent cath without significant disease.  Patient has known arrhythmias.  Case discussed with Lurena Joiner, [place] on-call for cardiology group.  He recommends second troponin and close followup in the cardiology office.    Kalman Drape, MD 09/20/13 [phone]

## 2013-09-23 ENCOUNTER — Telehealth: Payer: Self-pay | Admitting: Cardiology

## 2013-09-23 NOTE — Telephone Encounter (Signed)
Called and requested that he send a manual transmission for his loop recorder b/c his monitor has became deactivated. Pt stated that he just send a transmission last night but he will resend transmission now.

## 2013-10-03 ENCOUNTER — Encounter: Payer: Self-pay | Admitting: Cardiology

## 2013-10-03 ENCOUNTER — Ambulatory Visit (INDEPENDENT_AMBULATORY_CARE_PROVIDER_SITE_OTHER): Payer: Medicare Other | Admitting: Cardiology

## 2013-10-03 ENCOUNTER — Telehealth: Payer: Self-pay | Admitting: *Deleted

## 2013-10-03 VITALS — BP 116/70 | HR 52 | Ht 70.0 in | Wt 193.8 lb

## 2013-10-03 DIAGNOSIS — I2510 Atherosclerotic heart disease of native coronary artery without angina pectoris: Secondary | ICD-10-CM

## 2013-10-03 DIAGNOSIS — I350 Nonrheumatic aortic (valve) stenosis: Secondary | ICD-10-CM

## 2013-10-03 DIAGNOSIS — R55 Syncope and collapse: Secondary | ICD-10-CM

## 2013-10-03 DIAGNOSIS — I509 Heart failure, unspecified: Secondary | ICD-10-CM

## 2013-10-03 DIAGNOSIS — I359 Nonrheumatic aortic valve disorder, unspecified: Secondary | ICD-10-CM

## 2013-10-03 DIAGNOSIS — I5022 Chronic systolic (congestive) heart failure: Secondary | ICD-10-CM

## 2013-10-03 NOTE — Telephone Encounter (Signed)
Malachy Mood, Dr. Doug Sou nurse, requested clarification to call pt concerning ILR home monitoring.   Pt had monitor directly connected to phone jack via adapter sent from [place] (his monitor could not obtain a cell signal). Pt states his home phone service runs through a fiber line. His phone company, [employer], states he needs a router to effectively send remotes. Pt does not want to buy extra equipment (I agreed). I suggested trying another phone jack. Pt will attempt another phone jack along w/ attempting configurations both w/ & w/out a DSL filter. Pt will attempt this weekend. He wants to call the phone company to trouble shoot. I instructed pt NOT to contact phone company. I instructed him to call [employer]. I gave him their number. I also informed pt that if communication cannot be achieved via remote, he will need to come to the device clinic quarterly for checks. I also suggested attempting transmissions from another location other than his home that does have adequate cell reception. Pt reluctant to do so, but understood if another location is not desirable, he will need to come to clinic quarterly.   Pt also needed clarification for his activator. I explained that it does not store info. It is a digital time stamp that instructs his ILR to record a longer, more detailed episode that assists in deciphering arrhythmic symptoms. Pt will call  [employer] for further assistance.

## 2013-10-03 NOTE — Progress Notes (Signed)
Jonathan Acevedo Date of Birth: 78/17/1930 Medical Record #[id number]  History of Present Illness: Jonathan Acevedo is seen for follow up today.He has a history of CAD with remote MI and prior stent to the LAD, HTN, CKD, AS and HLD. He was hospitalized briefly in early April of 2014 with chest pain. A subsequent stress Myoview study showed extensive scar of the anterior apex and inferior wall. Ejection fraction is 44%. This actually represented an improvement from echocardiogram in 2012 at which time his ejection fraction was 35-40%. In Ocober 2014 he presented with a prolonged episode of chest pain. Echo showed no change in his AS - medical management was continued.   He was admitted twice back in April 2015 with syncope that resulted in a fractured right ankle and acute systolic HF - was diuresed. He has been cathed and had loop recorder placed. Syncope felt to be related to Flomax. Unable to take ACEi due to CKD and hypotension.  In July he was readmitted with acute on chronic CHF. Diuresed with good result. No recurrent syncope. On follow up today weight has been stable at home. No increase edema. Breathing is doing OK. Denies orthostatic dizziness. He was seen in ED for chest pain on August 15th without acute findings.   Current Outpatient Prescriptions  Medication Sig Dispense Refill  . aspirin 325 MG EC tablet Take 325 mg by mouth every morning.       Marland Kitchen atorvastatin (LIPITOR) 40 MG tablet Take 1 tablet by mouth daily.      . Cholecalciferol (VITAMIN D PO) Take 1 tablet by mouth daily.       . ferrous sulfate (SM IRON) 325 (65 FE) MG tablet Take 325 mg by mouth daily.      . furosemide (LASIX) 40 MG tablet Take 40 mg am and 20 mg pm.  60 tablet  6  . HYDROcodone-acetaminophen (NORCO) 7.5-325 MG per tablet Take 1 tablet by mouth every 6 (six) hours as needed for moderate pain.      . nitroGLYCERIN (NITROSTAT) 0.4 MG SL tablet Place 1 tablet (0.4 mg total) under the tongue every 5 (five) minutes as  needed for chest pain.  25 tablet  3  . Oyster Shell (OYSTER CALCIUM) 500 MG TABS Take 500 mg of elemental calcium by mouth daily.       . potassium chloride SA (K-DUR,KLOR-CON) 20 MEQ tablet Take 1 tablet (20 mEq total) by mouth daily.  30 tablet  11   No current facility-administered medications for this visit.    Allergies  Allergen Reactions  . Ibandronic Acid Other (See Comments)    Muscle Aches-pt denies  . Risedronate Sodium Other (See Comments)    Muscle aches    Past Medical History  Diagnosis Date  . Coronary artery disease     w remote anterior myocardial infarction (1998)  . Hypertension   . Renal insufficiency   . Aortic stenosis   . Rectal polyp   . Hypercholesteremia   . Kidney stones   . 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 Hypercalcemia 12/26/2011  . Afib   . Syncope 05/18/2013    Past Surgical History  Procedure Laterality Date  . Cardiac catheterization  78/13/2010    LMain 20, LAD stent 20 ISR, CFX 10, RCA 20, EF 45  . Umbilical hernia repair    . Inguinal hernia repair      left  . Orif femur fracture      right  . Cataract extraction    .  Coronary stent placement  07/19/1996    Proximal LAD  . Parathyroidectomy    . Loop recorder implant  78-13-15    MDT LinQ implanted by Dr Lovena Le for syncope following negative EPS  . Electrophysiology study  78-13-15    EPS for syncope without inducible arrhythmias by Dr Lovena Le    History  Smoking status  . Former Smoker -- 1.00 packs/day for 45 years  . Types: Cigarettes  . Quit date: 07/19/1996  Smokeless tobacco  . Never Used    History  Alcohol Use No    History reviewed. No pertinent family history.  Review of Systems: The review of systems is per the HPI.  All other systems were reviewed and are negative.  Physical Exam: BP 116/70  Pulse 52  Ht 5\' 10"  (1.778 m)  Wt 193 lb 12.8 oz (87.907 kg)  BMI 27.81 kg/m2 Patient is very pleasant and in no acute distress. Skin is warm and dry. Color  is normal.  HEENT is unremarkable. Normocephalic/atraumatic. PERRL. Sclera are nonicteric. Neck is supple. No masses. No JVD. Lungs are clear. Cardiac exam shows a regular rate and rhythm. Harsh 2-3/6 outflow murmur noted. Abdomen is soft. Extremities with trace edema. Gait and ROM are intact. No gross neurologic deficits noted.  Wt Readings from Last 3 Encounters:  78/28/15 193 lb 12.8 oz (87.907 kg)  78/23/15 192 lb (87.091 kg)  78/08/15 191 lb 14.4 oz (87.045 kg)     LABORATORY DATA: BMET pending  Lab Results   Lab Results  Component Value Date   WBC 6.2 09/20/2013   HGB 12.1* 09/20/2013   HCT 36.8* 09/20/2013   PLT 153 09/20/2013   GLUCOSE 120* 09/20/2013   CHOL 133 11/17/2012   TRIG 105 11/17/2012   HDL 54 11/17/2012   LDLCALC 58 11/17/2012   ALT 14 05/25/2013   AST 17 05/25/2013   NA 140 09/20/2013   K 4.2 09/20/2013   CL 102 09/20/2013   CREATININE 1.89* 09/20/2013   BUN 42* 09/20/2013   CO2 24 09/20/2013   TSH 1.453 Test methodology is 3rd generation TSH 08/17/2008   INR 1.21 05/19/2013   HGBA1C 6.2* 11/16/2012    Echo Study Conclusions  - Left ventricle: The cavity size was mildly dilated. There was moderate concentric hypertrophy. Systolic function was mildly to moderately reduced. The estimated ejection fraction was in the range of 40% to 45%. There is akinesis of the mid anteroseptal, distal septal and inferior wall and true apex. Doppler parameters are consistent with abnormal left ventricular relaxation (grade 1 diastolic dysfunction). Doppler parameters are consistent with elevated ventricular end-diastolic filling pressure. - Aortic valve: Cusp separation was mildly reduced. There was moderate stenosis. Valve area: 1.25cm^2(VTI). Valve area: 1.1cm^2 (Vmax). - Mitral valve: Calcified annulus. Mildly thickened leaflets . Transvalvular velocity was within the normal range. There was no evidence for stenosis. No regurgitation. - Left atrium: The atrium was severely  dilated. - Right ventricle: The cavity size was mildly dilated. Wall thickness was normal. Systolic function was normal. - Right atrium: The atrium was moderately dilated. - Tricuspid valve: Mild regurgitation. - Pulmonary arteries: Systolic pressure was within the normal range. - Inferior vena cava: The vessel was dilated; the respirophasic diameter changes were blunted (< 50%); findings are consistent with elevated central venous pressure. - Impressions: When compared to the prior study from 2014 left ventricle is now midly dilated with elevated filling pressures. LVEF is the same estimated at 40-45%. Aortic stenosis is unchanged estimated as moderate  with mean gradient 24 mmHg. Impressions:  - When compared to the prior study from 2014 left ventricle is now midly dilated with elevated filling pressures. LVEF is the same estimated at 40-45%. Aortic stenosis is unchanged estimated as moderate with mean gradient 24 mmHg.   Cardiac Catheterization Procedure Note  Name: IFEANYI MICKELSON  MRN: [id number]  DOB: 1928/12/17  Procedure: Left Heart Cath, Selective Coronary Angiography, LV angiography  Indication: 80 yo WM with history of remote anterior MI and stenting of the LAD in 1998 presents with CHF, syncope, and PVCs  Procedural Details: The right wrist was prepped, draped, and anesthetized with 1% lidocaine. Using the modified Seldinger technique, a 5 French sheath was introduced into the right radial artery. 3 mg of verapamil was administered through the sheath, weight-based unfractionated heparin was administered intravenously. Standard Judkins catheters were used for selective coronary angiography and left ventriculography. Catheter exchanges were performed over an exchange length guidewire. There were no immediate procedural complications. A TR band was used for radial hemostasis at the completion of the procedure. The patient was transferred to the post catheterization recovery area  for further monitoring.  Procedural Findings:  Hemodynamics:  AO 141/78 mean 107 mm Hg  LV 157/28 mm Hg  AV gradient of 19 mm Hg  Coronary angiography:  Coronary dominance: right  Left mainstem: 20% ostial disease  Left anterior descending (LAD): There is a segment of stents in the proximal LAD that is widely patent. No obstructive disease.  Left circumflex (LCx): Large vessel with 20% disease in the proximal vessel.  Right coronary artery (RCA): Moderately calcified with 20% disease in the proximal and mid vessel.  Left ventriculography: Left ventricular systolic function is abnormal, there is akinesis of the mid to distal anterior wall and entire apex. LVEF is estimated at 35-40%, there is no significant mitral regurgitation  Final Conclusions:  1. Nonobstructive CAD  2. Moderate to severe LV dysfunction.  3. Mild to moderate aortic stenosis by gradient.  4. Elevated LVEDP.  Recommendations: proceed with EP study. Needs more diuresis.  Ander Slade [facility]  05/19/2013, 4:28 PM    Assessment / Plan:  1. Chronic systolic HF - looks well compensated clinically. Reinforced importance of sodium restriction. Continue current diuretic dose. See back in 3 months. Labs recently updated in ED.  2. CAD - last cath showed nonobstructive disease. No active symptoms.   3. Syncope - has loop recorder in place - no recurrent episodes.   4. CKD  5. AS - no cardinal symptoms.

## 2013-10-03 NOTE — Patient Instructions (Signed)
Continue your current therapy   I will see you in 3 months. 

## 2013-10-05 DIAGNOSIS — Z8739 Personal history of other diseases of the musculoskeletal system and connective tissue: Secondary | ICD-10-CM | POA: Insufficient documentation

## 2013-10-10 LAB — MDC_IDC_ENUM_SESS_TYPE_REMOTE

## 2013-10-15 ENCOUNTER — Ambulatory Visit (INDEPENDENT_AMBULATORY_CARE_PROVIDER_SITE_OTHER): Payer: Medicare Other | Admitting: *Deleted

## 2013-10-15 ENCOUNTER — Encounter: Payer: Self-pay | Admitting: Cardiology

## 2013-10-15 ENCOUNTER — Encounter: Payer: Self-pay | Admitting: Internal Medicine

## 2013-10-15 DIAGNOSIS — R55 Syncope and collapse: Secondary | ICD-10-CM

## 2013-10-15 LAB — MDC_IDC_ENUM_SESS_TYPE_REMOTE: Date Time Interrogation Session: 20150511040500

## 2013-10-16 ENCOUNTER — Encounter: Payer: Self-pay | Admitting: Internal Medicine

## 2013-10-23 NOTE — Progress Notes (Signed)
Loop recorder 

## 2013-10-28 ENCOUNTER — Other Ambulatory Visit: Payer: Self-pay | Admitting: *Deleted

## 2013-10-28 MED ORDER — FUROSEMIDE 40 MG PO TABS: ORAL_TABLET | ORAL | Status: DC

## 2013-10-28 MED ORDER — ATORVASTATIN CALCIUM 40 MG PO TABS: 40.0000 mg | ORAL_TABLET | Freq: Every day | ORAL | Status: DC

## 2013-10-31 ENCOUNTER — Telehealth: Payer: Self-pay | Admitting: Cardiology

## 2013-10-31 ENCOUNTER — Encounter: Payer: Self-pay | Admitting: Internal Medicine

## 2013-10-31 NOTE — Telephone Encounter (Signed)
Received call from patient Dr.Jordan was told about chest pain you had this morning.Advised if continues to have chest pain to call back.Patient stated he has felt good all day no chest pain.

## 2013-10-31 NOTE — Telephone Encounter (Signed)
Received call from patient he stated he woke up at 4:15 am with chest pain B/P 157/92 pulse 50 bpm.Stated took 2 aspirin.Stated pain lasted appox 30 mins.Stated he did remember waking up at 3:30 am drank a cold glass of milk.Stated while he was having chest pain he did send reading to loop recorder.Stated he feels good now B/P 126/85 wanted Dr.Jordan to know.

## 2013-11-04 LAB — MDC_IDC_ENUM_SESS_TYPE_REMOTE
Date Time Interrogation Session: 20150909235424
MDC IDC SET ZONE DETECTION INTERVAL: 2000 ms
MDC IDC SET ZONE DETECTION INTERVAL: 390 ms
Zone Setting Detection Interval: 3000 ms

## 2013-11-12 ENCOUNTER — Ambulatory Visit (INDEPENDENT_AMBULATORY_CARE_PROVIDER_SITE_OTHER): Payer: Medicare Other | Admitting: *Deleted

## 2013-11-12 ENCOUNTER — Encounter: Payer: Self-pay | Admitting: Internal Medicine

## 2013-11-12 DIAGNOSIS — R55 Syncope and collapse: Secondary | ICD-10-CM

## 2013-11-12 LAB — MDC_IDC_ENUM_SESS_TYPE_REMOTE
MDC IDC SESS DTM: 20151007235722
MDC IDC SET ZONE DETECTION INTERVAL: 2000 ms
MDC IDC SET ZONE DETECTION INTERVAL: 390 ms
Zone Setting Detection Interval: 3000 ms

## 2013-11-17 ENCOUNTER — Encounter: Payer: Self-pay | Admitting: Internal Medicine

## 2013-11-21 ENCOUNTER — Other Ambulatory Visit: Payer: Self-pay

## 2013-11-21 NOTE — Progress Notes (Signed)
Loop recorder 

## 2013-12-01 ENCOUNTER — Ambulatory Visit (INDEPENDENT_AMBULATORY_CARE_PROVIDER_SITE_OTHER): Payer: Medicare Other | Admitting: *Deleted

## 2013-12-01 DIAGNOSIS — R55 Syncope and collapse: Secondary | ICD-10-CM

## 2013-12-08 ENCOUNTER — Other Ambulatory Visit: Payer: Self-pay

## 2013-12-08 MED ORDER — POTASSIUM CHLORIDE CRYS ER 20 MEQ PO TBCR: 20.0000 meq | EXTENDED_RELEASE_TABLET | Freq: Every day | ORAL | Status: DC

## 2013-12-08 MED ORDER — FUROSEMIDE 40 MG PO TABS: ORAL_TABLET | ORAL | Status: DC

## 2013-12-11 ENCOUNTER — Encounter: Payer: Self-pay | Admitting: Internal Medicine

## 2013-12-23 LAB — MDC_IDC_ENUM_SESS_TYPE_REMOTE: Date Time Interrogation Session: 20151026040500

## 2013-12-24 ENCOUNTER — Encounter: Payer: Self-pay | Admitting: Internal Medicine

## 2013-12-24 NOTE — Progress Notes (Signed)
Loop recorder 

## 2013-12-25 ENCOUNTER — Telehealth: Payer: Self-pay | Admitting: Cardiology

## 2013-12-25 NOTE — Telephone Encounter (Signed)
Returned your call , will be in and out can leave a msg on his machine . Please Call

## 2013-12-25 NOTE — Telephone Encounter (Signed)
New Msg    Patient returning call would like to be contacted at [phone].

## 2013-12-25 NOTE — Telephone Encounter (Signed)
Returned call to patient he stated he had a episode last night.Stated he had low pulse 30.Stated he had slight chest pain and jaw pain.Stated took 2 aspirin and chest pain relieved.Stated he did send in a transmission.Stated he has been having trouble with sending transmissions with loop recorder.Stated he feels good this morning B/P 123/73 pulse 53.Message sent to [facility] in device clinic.

## 2013-12-25 NOTE — Telephone Encounter (Signed)
Returned call to patient.I already spoke to patient earlier.Advised to call device clinic at [facility].

## 2013-12-25 NOTE — Telephone Encounter (Signed)
LMVOM w/ my direct # to discuss loop recorder transmissions. Let pt know we are receiving all transmissions.

## 2013-12-29 ENCOUNTER — Ambulatory Visit (INDEPENDENT_AMBULATORY_CARE_PROVIDER_SITE_OTHER): Payer: Medicare Other | Admitting: Cardiology

## 2013-12-29 ENCOUNTER — Encounter: Payer: Self-pay | Admitting: Cardiology

## 2013-12-29 VITALS — BP 118/68 | HR 56 | Ht 70.25 in | Wt 198.5 lb

## 2013-12-29 DIAGNOSIS — I350 Nonrheumatic aortic (valve) stenosis: Secondary | ICD-10-CM

## 2013-12-29 DIAGNOSIS — I2510 Atherosclerotic heart disease of native coronary artery without angina pectoris: Secondary | ICD-10-CM

## 2013-12-29 DIAGNOSIS — N183 Chronic kidney disease, stage 3 unspecified: Secondary | ICD-10-CM

## 2013-12-29 DIAGNOSIS — I5022 Chronic systolic (congestive) heart failure: Secondary | ICD-10-CM

## 2013-12-29 DIAGNOSIS — N189 Chronic kidney disease, unspecified: Secondary | ICD-10-CM

## 2013-12-29 NOTE — Patient Instructions (Signed)
Continue your current therapy  I will see you 6 months   

## 2013-12-29 NOTE — Progress Notes (Signed)
Jonathan Acevedo Date of Birth: [birth year] Medical Record #[id number]  History of Present Illness: Mr. Altland is seen for follow up of CAD and CHF.  He has a history of CAD with remote MI and prior stent to the LAD, HTN, CKD, AS and HLD. He was hospitalized in April of 2014 with chest pain. A subsequent stress Myoview study showed extensive scar of the anterior apex and inferior wall. Ejection fraction is 44%. This actually represented an improvement from echocardiogram in 2012 at which time his ejection fraction was 35-40%. In Ocober 2014 he presented with a prolonged episode of chest pain. Echo showed no change in his AS - medical management was continued.   He was admitted twice back in April 2015 with syncope that resulted in a fractured right ankle and acute systolic HF - was diuresed. Cardiac cath showed no obstructive disease and mild to moderate AS. He had a loop recorder placed. Syncope felt to be related to Flomax. Unable to take ACEi due to CKD and hypotension.  In July 2015 he was readmitted with acute on chronic CHF. Diuresed with good result. No recurrent syncope. On follow up today weight has been stable at home between 191-193 lbs.. No increase edema. Breathing is doing OK. Denies orthostatic dizziness. He can tell if he eats too much salt he has some dyspnea in the pm. He is back exercising regularly at the gym an swimming. He feels very well. Taking lasix 40 mg in the am and 20 mg in the pm.  Current Outpatient Prescriptions  Medication Sig Dispense Refill  . aspirin 325 MG EC tablet Take 325 mg by mouth every morning.     Marland Kitchen atorvastatin (LIPITOR) 40 MG tablet Take 1 tablet (40 mg total) by mouth daily. 90 tablet 0  . Cholecalciferol (VITAMIN D PO) Take 1 tablet by mouth daily.     . furosemide (LASIX) 40 MG tablet Take 40 mg am and 20 mg pm. 135 tablet 0  . HYDROcodone-acetaminophen (NORCO/VICODIN) 5-325 MG per tablet Take 1 tablet by mouth every 6 (six) hours as needed for moderate  pain.    . nitroGLYCERIN (NITROSTAT) 0.4 MG SL tablet Place 1 tablet (0.4 mg total) under the tongue every 5 (five) minutes as needed for chest pain. 25 tablet 3  . Oyster Shell (OYSTER CALCIUM) 500 MG TABS Take 500 mg of elemental calcium by mouth daily.     . potassium chloride SA (K-DUR,KLOR-CON) 20 MEQ tablet Take 1 tablet (20 mEq total) by mouth daily. 90 tablet 1   No current facility-administered medications for this visit.    Allergies  Allergen Reactions  . Ibandronic Acid Other (See Comments)    Muscle Aches-pt denies  . Risedronate Sodium Other (See Comments)    Muscle aches    Past Medical History  Diagnosis Date  . Coronary artery disease     w remote anterior myocardial infarction (1998)  . Hypertension   . Renal insufficiency   . Aortic stenosis   . Rectal polyp   . Hypercholesteremia   . Kidney stones   . 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 Hypercalcemia 12/26/2011  . Afib   . Syncope 05/18/2013    Past Surgical History  Procedure Laterality Date  . Cardiac catheterization  08/18/2008    LMain 20, LAD stent 20 ISR, CFX 10, RCA 20, EF 45  . Umbilical hernia repair    . Inguinal hernia repair      left  . Orif femur fracture  right  . Cataract extraction    . Coronary stent placement  07/19/1996    Proximal LAD  . Parathyroidectomy    . Loop recorder implant  05-19-13    MDT LinQ implanted by Dr Lovena Le for syncope following negative EPS  . Electrophysiology study  05-19-13    EPS for syncope without inducible arrhythmias by Dr Lovena Le    History  Smoking status  . Former Smoker -- 1.00 packs/day for 45 years  . Types: Cigarettes  . Quit date: 07/19/1996  Smokeless tobacco  . Never Used    History  Alcohol Use No    History reviewed. No pertinent family history.  Review of Systems: The review of systems is per the HPI.  All other systems were reviewed and are negative.  Physical Exam: BP 118/68 mmHg  Pulse 56  Ht 5' 10.25" (1.784 m)   Wt 198 lb 8 oz (90.039 kg)  BMI 28.29 kg/m2 Patient is very pleasant and in no acute distress. Skin is warm and dry. Color is normal.  HEENT is unremarkable. Normocephalic/atraumatic. PERRL. Sclera are nonicteric. Neck is supple. No masses. No JVD. Lungs are clear. Cardiac exam shows a regular rate and rhythm. Harsh 2-3/6 outflow murmur noted. Abdomen is soft. Extremities with trace edema. Gait and ROM are intact. No gross neurologic deficits noted.  Wt Readings from Last 3 Encounters:  12/29/13 198 lb 8 oz (90.039 kg)  10/03/13 193 lb 12.8 oz (87.907 kg)  08/28/13 192 lb (87.091 kg)     LABORATORY DATA:   Lab Results   Lab Results  Component Value Date   WBC 6.2 09/20/2013   HGB 12.1* 09/20/2013   HCT 36.8* 09/20/2013   PLT 153 09/20/2013   GLUCOSE 120* 09/20/2013   CHOL 133 11/17/2012   TRIG 105 11/17/2012   HDL 54 11/17/2012   LDLCALC 58 11/17/2012   ALT 14 05/25/2013   AST 17 05/25/2013   NA 140 09/20/2013   K 4.2 09/20/2013   CL 102 09/20/2013   CREATININE 1.89* 09/20/2013   BUN 42* 09/20/2013   CO2 24 09/20/2013   TSH 1.453 Test methodology is 3rd generation TSH 08/17/2008   INR 1.21 05/19/2013   HGBA1C 6.2* 11/16/2012     Assessment / Plan:  1. Chronic systolic HF - he is  well compensated clinically. Reinforced importance of sodium restriction. Continue current diuretic dose. He is instructed to take an extra lasix for weight gain or increased swelling. See back in 6 months. Labs recently updated in ED. Not a candidate for beta blockers due to bradycardia. Not a candidate for ACEi due to hypotension and CKD.  2. CAD - last cath showed nonobstructive disease. No active symptoms.   3. Syncope - has loop recorder in place - few bradycardic episodes noted. Dr. Lovena Le following.   4. CKD- stage 3.   5. AS -mild to moderate. No cardinal symptoms.

## 2014-01-08 ENCOUNTER — Encounter: Payer: Self-pay | Admitting: Internal Medicine

## 2014-01-13 ENCOUNTER — Encounter: Payer: Self-pay | Admitting: Internal Medicine

## 2014-01-13 ENCOUNTER — Ambulatory Visit (INDEPENDENT_AMBULATORY_CARE_PROVIDER_SITE_OTHER): Payer: Medicare Other | Admitting: *Deleted

## 2014-01-13 DIAGNOSIS — R55 Syncope and collapse: Secondary | ICD-10-CM

## 2014-01-15 ENCOUNTER — Encounter (HOSPITAL_COMMUNITY): Payer: Self-pay | Admitting: Internal Medicine

## 2014-01-15 ENCOUNTER — Encounter: Payer: Self-pay | Admitting: Internal Medicine

## 2014-01-21 NOTE — Progress Notes (Signed)
Loop recorder 

## 2014-02-03 ENCOUNTER — Encounter: Payer: Self-pay | Admitting: Internal Medicine

## 2014-02-10 LAB — MDC_IDC_ENUM_SESS_TYPE_REMOTE
Date Time Interrogation Session: 20151208061758
Zone Setting Detection Interval: 2000 ms
Zone Setting Detection Interval: 3000 ms
Zone Setting Detection Interval: 390 ms

## 2014-03-04 DIAGNOSIS — G8929 Other chronic pain: Secondary | ICD-10-CM | POA: Insufficient documentation

## 2014-03-04 DIAGNOSIS — M545 Low back pain: Secondary | ICD-10-CM

## 2014-03-06 ENCOUNTER — Other Ambulatory Visit: Payer: Self-pay | Admitting: Cardiology

## 2014-03-09 NOTE — Telephone Encounter (Signed)
Rx refill sent to patient pharmacy   

## 2014-03-12 ENCOUNTER — Emergency Department (HOSPITAL_COMMUNITY): Payer: Medicare Other

## 2014-03-12 ENCOUNTER — Encounter (HOSPITAL_COMMUNITY): Payer: Self-pay | Admitting: Emergency Medicine

## 2014-03-12 ENCOUNTER — Emergency Department (HOSPITAL_COMMUNITY)
Admission: EM | Admit: 2014-03-12 | Discharge: 2014-03-13 | Disposition: A | Discharge status: Home / Self Care | Payer: Medicare Other | Attending: Emergency Medicine | Admitting: Emergency Medicine

## 2014-03-12 DIAGNOSIS — Z79899 Other long term (current) drug therapy: Secondary | ICD-10-CM | POA: Diagnosis not present

## 2014-03-12 DIAGNOSIS — Z87448 Personal history of other diseases of urinary system: Secondary | ICD-10-CM | POA: Insufficient documentation

## 2014-03-12 DIAGNOSIS — Z87891 Personal history of nicotine dependence: Secondary | ICD-10-CM | POA: Diagnosis not present

## 2014-03-12 DIAGNOSIS — Z87442 Personal history of urinary calculi: Secondary | ICD-10-CM | POA: Insufficient documentation

## 2014-03-12 DIAGNOSIS — I2510 Atherosclerotic heart disease of native coronary artery without angina pectoris: Secondary | ICD-10-CM | POA: Insufficient documentation

## 2014-03-12 DIAGNOSIS — R079 Chest pain, unspecified: Secondary | ICD-10-CM | POA: Diagnosis present

## 2014-03-12 DIAGNOSIS — Z86010 Personal history of colonic polyps: Secondary | ICD-10-CM | POA: Diagnosis not present

## 2014-03-12 DIAGNOSIS — Z7982 Long term (current) use of aspirin: Secondary | ICD-10-CM | POA: Diagnosis not present

## 2014-03-12 DIAGNOSIS — R0789 Other chest pain: Secondary | ICD-10-CM

## 2014-03-12 DIAGNOSIS — I10 Essential (primary) hypertension: Secondary | ICD-10-CM | POA: Diagnosis not present

## 2014-03-12 DIAGNOSIS — I4891 Unspecified atrial fibrillation: Secondary | ICD-10-CM | POA: Diagnosis not present

## 2014-03-12 DIAGNOSIS — E780 Pure hypercholesterolemia: Secondary | ICD-10-CM | POA: Insufficient documentation

## 2014-03-12 LAB — CBC
HCT: 40.3 % (ref 39.0–52.0)
HEMOGLOBIN: 13.4 g/dL (ref 13.0–17.0)
MCH: 31.2 pg (ref 26.0–34.0)
MCHC: 33.3 g/dL (ref 30.0–36.0)
MCV: 93.9 fL (ref 78.0–100.0)
Platelets: 182 10*3/uL (ref 150–400)
RBC: 4.29 MIL/uL (ref 4.22–5.81)
RDW: 13.8 % (ref 11.5–15.5)
WBC: 7.1 10*3/uL (ref 4.0–10.5)

## 2014-03-12 LAB — BASIC METABOLIC PANEL
Anion gap: 7 (ref 5–15)
BUN: 34 mg/dL — AB (ref 6–23)
CO2: 31 mmol/L (ref 19–32)
Calcium: 9.6 mg/dL (ref 8.4–10.5)
Chloride: 103 mmol/L (ref 96–112)
Creatinine, Ser: 1.9 mg/dL — ABNORMAL HIGH (ref 0.50–1.35)
GFR, EST AFRICAN AMERICAN: 36 mL/min — AB (ref >90)
GFR, EST NON AFRICAN AMERICAN: 31 mL/min — AB (ref >90)
Glucose, Bld: 89 mg/dL (ref 70–99)
POTASSIUM: 3.9 mmol/L (ref 3.5–5.1)
Sodium: 141 mmol/L (ref 135–145)

## 2014-03-12 LAB — I-STAT TROPONIN, ED: Troponin i, poc: 0.01 ng/mL (ref 0.00–0.08)

## 2014-03-12 NOTE — ED Provider Notes (Signed)
CSN: [id number]     Arrival date & time 03/12/14  2031 History   First MD Initiated Contact with Patient 03/12/14 2100     Chief Complaint  Patient presents with  . Chest Pain     (Consider location/radiation/quality/duration/timing/severity/associated sxs/prior Treatment) The history is provided by the patient.  Jonathan Acevedo is a 81 y.o. male hx of CAD s/p stent in 2007, HTN, aortic stenosis here with chest pain. Dull chest pain around 30 minutes prior to arrival. They radiate to his jaw. He took 2 baby aspirins and 1 nitroglycerin and now is pain-free. Denies any shortness of breath or nausea or diaphoresis. He had a similar episode almost a year ago and came to the ER and a delta troponin was sent home. He had a Myoview done about a year ago that shows no reversible ischemia. His doctor [place] in October.    Past Medical History  Diagnosis Date  . Coronary artery disease     w remote anterior myocardial infarction (1998)  . Hypertension   . Renal insufficiency   . Aortic stenosis   . Rectal polyp   . Hypercholesteremia   . Kidney stones   . 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 Hypercalcemia 12/26/2011  . Afib   . Syncope 05/18/2013   Past Surgical History  Procedure Laterality Date  . Cardiac catheterization  08/18/2008    LMain 20, LAD stent 20 ISR, CFX 10, RCA 20, EF 45  . Umbilical hernia repair    . Inguinal hernia repair      left  . Orif femur fracture      right  . Cataract extraction    . Coronary stent placement  07/19/1996    Proximal LAD  . Parathyroidectomy    . Loop recorder implant  05-19-13    MDT LinQ implanted by Dr Lovena Le for syncope following negative EPS  . Electrophysiology study  05-19-13    EPS for syncope without inducible arrhythmias by Dr Lovena Le  . Electrophysiology study N/A 05/19/2013    Procedure: ELECTROPHYSIOLOGY STUDY;  Surgeon: Evans Lance, MD;  Location[facility] CATH LAB;  Service: Cardiovascular;  Laterality: N/A;  . Left heart  catheterization with coronary angiogram N/A 05/19/2013    Procedure: LEFT HEART CATHETERIZATION WITH CORONARY ANGIOGRAM;  Surgeon: Peter M Martinique, MD;  Location: [facility] CATH LAB;  Service: Cardiovascular;  Laterality: N/A;   No family history on file. History  Substance Use Topics  . Smoking status: Former Smoker -- 1.00 packs/day for 45 years    Types: Cigarettes    Quit date: 07/19/1996  . Smokeless tobacco: Never Used  . Alcohol Use: No    Review of Systems  Cardiovascular: Positive for chest pain.  All other systems reviewed and are negative.     Allergies  Ibandronic acid and Risedronate sodium  Home Medications   Prior to Admission medications   Medication Sig Start Date End Date Taking? Authorizing Provider  aspirin 325 MG EC tablet Take 325 mg by mouth every morning.    Yes Historical Provider, MD  atorvastatin (LIPITOR) 40 MG tablet Take 1 tablet by mouth  daily 03/09/14  Yes Peter M Martinique, MD  Cholecalciferol (VITAMIN D PO) Take 1 tablet by mouth daily.    Yes Historical Provider, MD  furosemide (LASIX) 40 MG tablet Take 1 tablet by mouth in  the morning and one-half  tablet by mouth in the  evening 03/09/14  Yes Peter M Martinique, MD  HYDROcodone-acetaminophen (NORCO/VICODIN) 5-325 MG  per tablet Take 1 tablet by mouth every 6 (six) hours as needed for moderate pain.   Yes Historical Provider, MD  nitroGLYCERIN (NITROSTAT) 0.4 MG SL tablet Place 1 tablet (0.4 mg total) under the tongue every 5 (five) minutes as needed for chest pain. 12/26/12  Yes Peter M [place]ique, MD  Oyster Shell (OYSTER CALCIUM) 500 MG TABS Take 500 mg of elemental calcium by mouth daily.    Yes Historical Provider, MD  potassium chloride SA (K-DUR,KLOR-CON) 20 MEQ tablet Take 1 tablet (20 mEq total) by mouth daily. 12/08/13  Yes Peter M [place], MD   BP 134/77 mmHg  Pulse 50  Temp(Src) 98 F (36.7 C) (Oral)  Resp 15  Ht 5\' 10"  (1.778 m)  Wt 194 lb (87.998 kg)  BMI 27.84 kg/m2  SpO2 97% Physical Exam   Constitutional: He is oriented to person, place, and time. He appears well-developed and well-nourished.  NAD   HENT:  Head: Normocephalic.  Mouth/Throat: Oropharynx is clear and moist.  Eyes: Conjunctivae and EOM are normal. Pupils are equal, round, and reactive to light.  Neck: Normal range of motion. Neck supple.  Cardiovascular: Normal rate, regular rhythm and normal heart sounds.   Pulmonary/Chest: Effort normal and breath sounds normal. No respiratory distress. He has no wheezes. He has no rales.  Abdominal: Soft. Bowel sounds are normal. He exhibits no distension. There is no tenderness. There is no rebound and no guarding.  Musculoskeletal: Normal range of motion. He exhibits no edema or tenderness.  Neurological: He is alert and oriented to person, place, and time. No cranial nerve deficit. Coordination normal.  Skin: Skin is warm and dry.  Psychiatric: He has a normal mood and affect. His behavior is normal. Judgment and thought content normal.  Nursing note and vitals reviewed.   ED Course  Procedures (including critical care time) Labs Review Labs Reviewed  BASIC METABOLIC PANEL - Abnormal; Notable for the following:    BUN 34 (*)    Creatinine, Ser 1.90 (*)    GFR calc non Af Amer 31 (*)    GFR calc Af Amer 36 (*)    All other components within normal limits  CBC  I-STAT TROPOININ, ED    Imaging Review Dg Chest 2 View  03/12/2014   CLINICAL DATA:  Central chest pain for 1 day, no shortness of breath. History of hypertension, atrial fibrillation, syncope and aortic stenosis.  EXAM: CHEST  2 VIEW  COMPARISON:  Chest radiograph September 20, 2013  FINDINGS: The cardiac silhouette is mildly enlarged, unchanged. Tortuous, moderately calcified aorta. LEFT lung base scarring. No pleural effusions or focal consolidations. Mildly increased lung volumes with flattened hemidiaphragms can be seen with COPD. No pneumothorax.  Loop monitor in LEFT chest wall.  Scoliosis.  IMPRESSION:  Stable cardiomegaly. LEFT lung base scarring without superimposed acute pulmonary process.   Electronically Signed   By: Elon Alas   On: 03/12/2014 21:04     EKG Interpretation   Date/Time:  Thursday March 12 2014 20:38:16 EST Ventricular Rate:  59 PR Interval:  178 QRS Duration: 98 QT Interval:  428 QTC Calculation: 423 R Axis:   -57 Text Interpretation:  Sinus bradycardia Possible Left atrial enlargement  Incomplete right bundle branch block Left anterior fascicular block  Possible Anteroseptal infarct , age undetermined Abnormal ECG previous EKG  showed afib Confirmed by Audrianna Driskill  MD, Dalanie Kisner ([id number]) on 03/12/2014 8:56:32 PM      MDM   Final diagnoses:  Other chest pain  Jonathan Acevedo is a 81 y.o. male here with chest pain. Pain resolved. No SOB so I doubt CHF exacerbation. I called Dr. Eulas Post from cardiology, who recommend delta trop in 4 hrs and if pain free can d/c home and f/u outpatient.   11:33 PM Signed out to Dr. Lita Mains to f/u troponin and if neg can d/c home.     Wandra Arthurs, MD 03/12/14 [phone]

## 2014-03-12 NOTE — ED Notes (Signed)
PT given [place] sandwich per MD Darl Householder.

## 2014-03-12 NOTE — ED Notes (Signed)
Pt. reports central chest pain " dull pain  " onset this evening , deneis SOB , no nausea or diaphoresis , pt. took 2 baby ASA and 1 NTG sl with no relief his cardiologist is Dr. [name] .

## 2014-03-12 NOTE — ED Notes (Signed)
EDP at bedside  

## 2014-03-12 NOTE — ED Notes (Signed)
Dr. Yao at the bedside. 

## 2014-03-13 ENCOUNTER — Telehealth: Payer: Self-pay | Admitting: Cardiology

## 2014-03-13 LAB — I-STAT TROPONIN, ED: Troponin i, poc: 0.01 ng/mL (ref 0.00–0.08)

## 2014-03-13 NOTE — Discharge Instructions (Signed)
Take your medicines as prescribed.   Follow up with Dr. Martinique.   Return to ER if you have severe chest pain, shortness of breath.  Chest Pain (Nonspecific) It is often hard to give a specific diagnosis for the cause of chest pain. There is always a chance that your pain could be related to something serious, such as a heart attack or a blood clot in the lungs. You need to follow up with your health care provider for further evaluation. CAUSES   Heartburn.  Pneumonia or bronchitis.  Anxiety or stress.  Inflammation around your heart (pericarditis) or lung (pleuritis or pleurisy).  A blood clot in the lung.  A collapsed lung (pneumothorax). It can develop suddenly on its own (spontaneous pneumothorax) or from trauma to the chest.  Shingles infection (herpes zoster virus). The chest wall is composed of bones, muscles, and cartilage. Any of these can be the source of the pain.  The bones can be bruised by injury.  The muscles or cartilage can be strained by coughing or overwork.  The cartilage can be affected by inflammation and become sore (costochondritis). DIAGNOSIS  Lab tests or other studies may be needed to find the cause of your pain. Your health care provider may have you take a test called an ambulatory electrocardiogram (ECG). An ECG records your heartbeat patterns over a 24-hour period. You may also have other tests, such as:  Transthoracic echocardiogram (TTE). During echocardiography, sound waves are used to evaluate how blood flows through your heart.  Transesophageal echocardiogram (TEE).  Cardiac monitoring. This allows your health care provider to monitor your heart rate and rhythm in real time.  Holter monitor. This is a portable device that records your heartbeat and can help diagnose heart arrhythmias. It allows your health care provider to track your heart activity for several days, if needed.  Stress tests by exercise or by giving medicine that makes the  heart beat faster. TREATMENT   Treatment depends on what may be causing your chest pain. Treatment may include:  Acid blockers for heartburn.  Anti-inflammatory medicine.  Pain medicine for inflammatory conditions.  Antibiotics if an infection is present.  You may be advised to change lifestyle habits. This includes stopping smoking and avoiding alcohol, caffeine, and chocolate.  You may be advised to keep your head raised (elevated) when sleeping. This reduces the chance of acid going backward from your stomach into your esophagus. Most of the time, nonspecific chest pain will improve within 2-3 days with rest and mild pain medicine.  HOME CARE INSTRUCTIONS   If antibiotics were prescribed, take them as directed. Finish them even if you start to feel better.  For the next few days, avoid physical activities that bring on chest pain. Continue physical activities as directed.  Do not use any tobacco products, including cigarettes, chewing tobacco, or electronic cigarettes.  Avoid drinking alcohol.  Only take medicine as directed by your health care provider.  Follow your health care provider's suggestions for further testing if your chest pain does not go away.  Keep any follow-up appointments you made. If you do not go to an appointment, you could develop lasting (chronic) problems with pain. If there is any problem keeping an appointment, call to reschedule. SEEK MEDICAL CARE IF:   Your chest pain does not go away, even after treatment.  You have a rash with blisters on your chest.  You have a fever. SEEK IMMEDIATE MEDICAL CARE IF:   You have increased chest pain  or pain that spreads to your arm, neck, jaw, back, or abdomen.  You have shortness of breath.  You have an increasing cough, or you cough up blood.  You have severe back or abdominal pain.  You feel nauseous or vomit.  You have severe weakness.  You faint.  You have chills. This is an emergency. Do  not wait to see if the pain will go away. Get medical help at once. Call your local emergency services (911 in [place] Do not drive yourself to the hospital. MAKE SURE YOU:   Understand these instructions.  Will watch your condition.  Will get help right away if you are not doing well or get worse. Document Released: 11/02/2004 Document Revised: 01/28/2013 Document Reviewed: 08/29/2007 [facility] Patient Information 2015 [place], [place]. This information is not intended to replace advice given to you by your health care provider. Make sure you discuss any questions you have with your health care provider.

## 2014-03-13 NOTE — Telephone Encounter (Signed)
Pt called in wanting to let Dr. Martinique know that he was in the ER last night for chest pain. After the doctors ran an EKG on him, they said the chest pain was most likely induced by stress or indegestion. He states that he is doing fine today.  FYI

## 2014-03-16 NOTE — Telephone Encounter (Signed)
Returned call to patient he stated he was doing good.No chest pain.Stated he had chest pain and went to [facility] ER last Thursday 03/12/14.Stated EKG and lab work normal.Follow up appointment scheduled with Dr.Jordan 06/16/14 at 10:00 am.Advised to call sooner if needed.

## 2014-03-19 ENCOUNTER — Ambulatory Visit (INDEPENDENT_AMBULATORY_CARE_PROVIDER_SITE_OTHER): Payer: Medicare Other | Admitting: *Deleted

## 2014-03-19 DIAGNOSIS — R55 Syncope and collapse: Secondary | ICD-10-CM

## 2014-03-19 LAB — MDC_IDC_ENUM_SESS_TYPE_REMOTE
Date Time Interrogation Session: 20160216164002
MDC IDC SET ZONE DETECTION INTERVAL: 2000 ms
Zone Setting Detection Interval: 3000 ms
Zone Setting Detection Interval: 390 ms

## 2014-03-23 NOTE — Progress Notes (Signed)
Loop recorder 

## 2014-03-24 ENCOUNTER — Encounter: Payer: Self-pay | Admitting: Cardiology

## 2014-03-24 ENCOUNTER — Encounter: Payer: Self-pay | Admitting: Internal Medicine

## 2014-03-24 ENCOUNTER — Telehealth: Payer: Self-pay | Admitting: Cardiology

## 2014-03-24 NOTE — Telephone Encounter (Signed)
LMOVM requesting that pt send manual transmission b/c home monitor has been disconnected.   

## 2014-03-25 ENCOUNTER — Encounter: Payer: Self-pay | Admitting: Internal Medicine

## 2014-03-25 ENCOUNTER — Telehealth: Payer: Self-pay | Admitting: Cardiology

## 2014-03-25 NOTE — Telephone Encounter (Signed)
Returning your call. °

## 2014-03-25 NOTE — Telephone Encounter (Signed)
LMTCB

## 2014-03-25 NOTE — Telephone Encounter (Signed)
Yesterday morning he had a slight jaw pain and chest pain,it lasted about 4 minutes.His question is do you think he needs to be seen?

## 2014-03-27 NOTE — Telephone Encounter (Signed)
Spoke w/ patient, he had no acute complaints, reported BP 117/68, HR 51. Had episode of brief jaw & chest pain the other day which quickly resolved.   Advised on this and suggested ER evaluation if active CP returns. He voiced understanding.   He had been in ER 2 weeks ago for CP symptoms, ECG and 2 cardiac enzyme sets done, troponins were neg, he was cleared and sent home.  Reports he has been keeping active and doing his regular exercise. BP this AM 117/68, HR 51.

## 2014-03-27 NOTE — Telephone Encounter (Signed)
Pt calling again. He said after 11:30 call him on his cell phone please.

## 2014-04-03 ENCOUNTER — Encounter: Payer: Self-pay | Admitting: Cardiology

## 2014-04-13 ENCOUNTER — Encounter: Payer: Self-pay | Admitting: Internal Medicine

## 2014-04-14 ENCOUNTER — Telehealth: Payer: Self-pay | Admitting: *Deleted

## 2014-04-14 NOTE — Telephone Encounter (Signed)
Patient states that he is having to send manual transmissions daily. I gave him the [facility] customer svc number for further trouble shooting. Patient voiced understanding.

## 2014-04-14 NOTE — Telephone Encounter (Signed)
Pt had q for device clinic: Has implantable loop recorder, unsure if he is supposed to manually submit reports & if so, in what frequency?  I show results ~monthly but he & i looked at these & unsure if they are consistent w/ the times he is trying to submit. He states he is getting some kind of error when trying to download results.  I explained that I have a deficit of knowledge on this & would defer to clinic.   Pt would appreciate callback. [phone]

## 2014-04-17 ENCOUNTER — Encounter: Payer: Self-pay | Admitting: Internal Medicine

## 2014-04-17 ENCOUNTER — Ambulatory Visit (INDEPENDENT_AMBULATORY_CARE_PROVIDER_SITE_OTHER): Payer: Medicare Other | Admitting: *Deleted

## 2014-04-17 DIAGNOSIS — R55 Syncope and collapse: Secondary | ICD-10-CM

## 2014-04-23 NOTE — Progress Notes (Signed)
Loop recorder 

## 2014-04-28 ENCOUNTER — Telehealth: Payer: Self-pay | Admitting: Cardiology

## 2014-04-28 ENCOUNTER — Encounter: Payer: Self-pay | Admitting: Internal Medicine

## 2014-04-28 NOTE — Telephone Encounter (Signed)
New Msg       Pt states he is returning call to device clinic.   Pt has been having trouble with transmission.    Please return call.

## 2014-04-28 NOTE — Telephone Encounter (Signed)
LMOM for return call/lwm

## 2014-05-05 LAB — MDC_IDC_ENUM_SESS_TYPE_REMOTE
Date Time Interrogation Session: 20160311075141
MDC IDC SET ZONE DETECTION INTERVAL: 2000 ms
MDC IDC SET ZONE DETECTION INTERVAL: 3000 ms
Zone Setting Detection Interval: 390 ms

## 2014-05-18 ENCOUNTER — Ambulatory Visit (INDEPENDENT_AMBULATORY_CARE_PROVIDER_SITE_OTHER): Payer: Medicare Other | Admitting: *Deleted

## 2014-05-18 ENCOUNTER — Encounter: Payer: Self-pay | Admitting: Internal Medicine

## 2014-05-18 DIAGNOSIS — R55 Syncope and collapse: Secondary | ICD-10-CM | POA: Diagnosis not present

## 2014-05-18 NOTE — Telephone Encounter (Signed)
Pt was spoken to about episode.

## 2014-05-21 NOTE — Progress Notes (Signed)
Loop recorder 

## 2014-05-22 ENCOUNTER — Encounter: Payer: Self-pay | Admitting: Internal Medicine

## 2014-05-25 ENCOUNTER — Encounter: Payer: Self-pay | Admitting: Internal Medicine

## 2014-05-28 ENCOUNTER — Encounter: Payer: Self-pay | Admitting: Cardiology

## 2014-05-28 ENCOUNTER — Encounter: Payer: Self-pay | Admitting: Internal Medicine

## 2014-06-01 ENCOUNTER — Telehealth: Payer: Self-pay

## 2014-06-01 DIAGNOSIS — I8390 Asymptomatic varicose veins of unspecified lower extremity: Secondary | ICD-10-CM

## 2014-06-01 NOTE — Telephone Encounter (Signed)
Phone call from pt.  Reported bulging veins on posterior left lower leg, just above the calf.  Stated the area is "non-tender"; denies redness or warmth.  Reported area involved is approx. 2 ", and protrudes "horizontally", and is "approx. diam. of a pencil."  Denied any bleeding from bulging veins; denied open ulcer on the left lower leg.  Denied swelling of the left lower extremity.  Advised to wear his compression stockings daily, while he is awake and on his feet, and to elevate his legs above level of heart at intervals during day.  Advised will call back with appt.  Verb. Understanding.

## 2014-06-01 NOTE — Telephone Encounter (Signed)
LM for pt re appts, asked for cb to confirm, dpm

## 2014-06-05 ENCOUNTER — Encounter: Payer: Self-pay | Admitting: Internal Medicine

## 2014-06-10 ENCOUNTER — Encounter: Payer: Self-pay | Admitting: Internal Medicine

## 2014-06-11 ENCOUNTER — Telehealth: Payer: Self-pay | Admitting: *Deleted

## 2014-06-11 NOTE — Telephone Encounter (Signed)
LMOM for return call. Pt had pause episode recorded on LINQ on 4-29 aroung 1341. ? Symptoms.

## 2014-06-12 LAB — CUP PACEART REMOTE DEVICE CHECK
Date Time Interrogation Session: 20160408110043
Zone Setting Detection Interval: 2000 ms
Zone Setting Detection Interval: 3000 ms
Zone Setting Detection Interval: 390 ms

## 2014-06-12 NOTE — Telephone Encounter (Signed)
Per pt no symptoms for pause episode on 06-05-14. Pt had several questions about Carelink monitor not working. Pt was also scheduled with GT for 07-07-14 @ 1600. Pt was not too happy about making appointment but appointment was made anyway.

## 2014-06-15 ENCOUNTER — Encounter (HOSPITAL_COMMUNITY): Payer: Medicare Other

## 2014-06-16 ENCOUNTER — Ambulatory Visit (INDEPENDENT_AMBULATORY_CARE_PROVIDER_SITE_OTHER): Payer: Medicare Other | Admitting: Cardiology

## 2014-06-16 ENCOUNTER — Encounter: Payer: Self-pay | Admitting: Cardiology

## 2014-06-16 VITALS — BP 110/68 | HR 48 | Ht 70.0 in | Wt 200.2 lb

## 2014-06-16 DIAGNOSIS — I350 Nonrheumatic aortic (valve) stenosis: Secondary | ICD-10-CM | POA: Diagnosis not present

## 2014-06-16 DIAGNOSIS — I10 Essential (primary) hypertension: Secondary | ICD-10-CM

## 2014-06-16 DIAGNOSIS — I5022 Chronic systolic (congestive) heart failure: Secondary | ICD-10-CM | POA: Diagnosis not present

## 2014-06-16 DIAGNOSIS — I2510 Atherosclerotic heart disease of native coronary artery without angina pectoris: Secondary | ICD-10-CM | POA: Diagnosis not present

## 2014-06-16 DIAGNOSIS — R55 Syncope and collapse: Secondary | ICD-10-CM

## 2014-06-16 NOTE — Patient Instructions (Signed)
Continue your current therapy  I will see you in 6 months.   

## 2014-06-16 NOTE — Progress Notes (Signed)
Jonathan Acevedo Date of Birth: 1928/09/06 Medical Record #[id number]  History of Present Illness: Mr. Fakhouri is seen for follow up of CAD and CHF.  He has a history of CAD with remote MI and prior stent to the LAD, HTN, CKD, AS and HLD. He was hospitalized in April of 2014 with chest pain. A subsequent stress Myoview study showed extensive scar of the anterior apex and inferior wall. Ejection fraction is 44%. This actually represented an improvement from echocardiogram in 2012 at which time his ejection fraction was 35-40%. In Ocober 2014 he presented with a prolonged episode of chest pain. Echo showed no change in his AS - medical management was continued.   He was admitted twice back in April 2015 with syncope that resulted in a fractured right ankle and acute systolic HF. Cardiac cath showed no obstructive disease and mild to moderate AS. He had a loop recorder placed. Syncope felt to be related to Flomax. Unable to take ACEi due to CKD and hypotension.  In July 2015 he was readmitted with acute on chronic CHF. Diuresed with good result. No recurrent syncope. On follow up today weight has been stable at home between 193-195 lbs.. No increase edema. Breathing is doing OK. Denies orthostatic dizziness. If he does get too much salt he feels jittery in the evening. He is upset that his loop recorder remote transmission doesn't work. He does have to stop several times while mowing the grass but this is due to back pain and not dyspnea. He does note varicose veins but they are not painful or inflamed.   Current Outpatient Prescriptions  Medication Sig Dispense Refill  . aspirin 325 MG EC tablet Take 325 mg by mouth every morning.     Marland Kitchen atorvastatin (LIPITOR) 40 MG tablet Take 1 tablet by mouth  daily 90 tablet 2  . furosemide (LASIX) 40 MG tablet Take 1 tablet by mouth in  the morning and one-half  tablet by mouth in the  evening 135 tablet 2  . HYDROcodone-acetaminophen (NORCO/VICODIN) 5-325 MG per  tablet Take 1 tablet by mouth every 6 (six) hours as needed for moderate pain.    . nitroGLYCERIN (NITROSTAT) 0.4 MG SL tablet Place 1 tablet (0.4 mg total) under the tongue every 5 (five) minutes as needed for chest pain. 25 tablet 3  . Oyster Shell (OYSTER CALCIUM) 500 MG TABS Take 500 mg of elemental calcium by mouth daily.     . potassium chloride SA (K-DUR,KLOR-CON) 20 MEQ tablet Take 1 tablet (20 mEq total) by mouth daily. 90 tablet 1   No current facility-administered medications for this visit.    Allergies  Allergen Reactions  . Ibandronic Acid Other (See Comments)    Muscle Aches-pt denies  . Risedronate Sodium Other (See Comments)    Muscle aches  . Flomax [Tamsulosin Hcl] Other (See Comments)    Syncope     Past Medical History  Diagnosis Date  . Coronary artery disease     w remote anterior myocardial infarction (1998)  . Hypertension   . Renal insufficiency   . Aortic stenosis   . Rectal polyp   . Hypercholesteremia   . Kidney stones   . 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 Hypercalcemia 12/26/2011  . Afib   . Syncope 05/18/2013    Past Surgical History  Procedure Laterality Date  . Cardiac catheterization  08/18/2008    LMain 20, LAD stent 20 ISR, CFX 10, RCA 20, EF 45  . Umbilical hernia  repair    . Inguinal hernia repair      left  . Orif femur fracture      right  . Cataract extraction    . Coronary stent placement  07/19/1996    Proximal LAD  . Parathyroidectomy    . Loop recorder implant  05-19-13    MDT LinQ implanted by Dr Lovena Le for syncope following negative EPS  . Electrophysiology study  05-19-13    EPS for syncope without inducible arrhythmias by Dr Lovena Le  . Electrophysiology study N/A 05/19/2013    Procedure: ELECTROPHYSIOLOGY STUDY;  Surgeon: Evans Lance, MD;  Location: [facility] C[facility] LAB;  Service: Cardiovascular;  Laterality: N/A;  . Left heart catheterization with coronary angiogram N/A 05/19/2013    Procedure: LEFT HEART CATHETERIZATION  WITH CORONARY ANGIOGRAM;  Surgeon: Tilda Samudio M Martinique, MD;  Location: [facility] CATH LAB;  Service: Cardiovascular;  Laterality: N/A;    History  Smoking status  . Former Smoker -- 1.00 packs/day for 45 years  . Types: Cigarettes  . Quit date: 07/19/1996  Smokeless tobacco  . Never Used    History  Alcohol Use No    No family history on file.  Review of Systems: The review of systems is per the HPI.  All other systems were reviewed and are negative.  Physical Exam: BP 110/68 mmHg  Pulse 48  Ht 5\' 10"  (1.778 m)  Wt 200 lb 3.2 oz (90.81 kg)  BMI 28.73 kg/m2 Patient is very pleasant and in no acute distress. Skin is warm and dry. Color is normal.  HEENT is unremarkable. Normocephalic/atraumatic. PERRL. Sclera are nonicteric. Neck is supple. No masses. No JVD. Lungs are clear. Cardiac exam shows a regular rate and rhythm. Harsh 2-3/6 outflow murmur noted. Abdomen is soft. Extremities with trace edema. Multiple venous varicosities. Gait and ROM are intact. No gross neurologic deficits noted.  Wt Readings from Last 3 Encounters:  06/16/14 200 lb 3.2 oz (90.81 kg)  12/29/13 198 lb 8 oz (90.039 kg)  10/03/13 193 lb 12.8 oz (87.907 kg)     LABORATORY DATA:   Lab Results  Component Value Date   WBC 7.1 03/12/2014   HGB 13.4 03/12/2014   HCT 40.3 03/12/2014   PLT 182 03/12/2014   GLUCOSE 89 03/12/2014   CHOL 133 11/17/2012   TRIG 105 11/17/2012   HDL 54 11/17/2012   LDLCALC 58 11/17/2012   ALT 14 05/25/2013   AST 17 05/25/2013   NA 141 03/12/2014   K 3.9 03/12/2014   CL 103 03/12/2014   CREATININE 1.90* 03/12/2014   BUN 34* 03/12/2014   CO2 31 03/12/2014   TSH 1.453 Test methodology is 3rd generation TSH 08/17/2008   INR 1.21 05/19/2013   HGBA1C 6.2* 11/16/2012     Assessment / Plan:  1. Chronic systolic HF - he is  well compensated clinically. Reinforced importance of sodium restriction. Continue current diuretic dose. He is instructed to take an extra lasix for weight gain  or increased swelling. Follow up in 6 months.  Not a candidate for beta blockers due to bradycardia. Not a candidate for ACEi due to hypotension and CKD.  2. CAD -remote anterior MI and stent of LAD. Last cath showed nonobstructive disease. No active symptoms.   3. Syncope - has loop recorder in place - some bradycardia noted but this is his baseline. Syncope most likely related to hypotension on Flomax.   4. CKD- stage 3.   5. AS -mild to moderate. No  symptoms.

## 2014-06-17 ENCOUNTER — Ambulatory Visit (INDEPENDENT_AMBULATORY_CARE_PROVIDER_SITE_OTHER): Payer: Medicare Other | Admitting: *Deleted

## 2014-06-17 ENCOUNTER — Encounter: Payer: Self-pay | Admitting: Internal Medicine

## 2014-06-17 DIAGNOSIS — R55 Syncope and collapse: Secondary | ICD-10-CM | POA: Diagnosis not present

## 2014-06-19 NOTE — Progress Notes (Signed)
Loop recorder 

## 2014-06-23 ENCOUNTER — Ambulatory Visit: Payer: Medicare Other | Admitting: Vascular Surgery

## 2014-06-24 ENCOUNTER — Telehealth: Payer: Self-pay | Admitting: *Deleted

## 2014-06-24 NOTE — Telephone Encounter (Signed)
Per pt sleeping at time of episode. No symptoms occurred.

## 2014-06-24 NOTE — Telephone Encounter (Signed)
Left message on home phone and cell in regards to pause episode on 5-17 around 707am. ? Symptoms

## 2014-06-26 ENCOUNTER — Encounter: Payer: Self-pay | Admitting: Internal Medicine

## 2014-07-02 ENCOUNTER — Encounter: Payer: Self-pay | Admitting: Internal Medicine

## 2014-07-04 ENCOUNTER — Other Ambulatory Visit: Payer: Self-pay | Admitting: Cardiology

## 2014-07-06 LAB — CUP PACEART REMOTE DEVICE CHECK: MDC IDC SESS DTM: 20160514040500

## 2014-07-07 ENCOUNTER — Encounter: Payer: Medicare Other | Admitting: Internal Medicine

## 2014-07-07 NOTE — Telephone Encounter (Signed)
Per note 5.10.16 

## 2014-07-15 ENCOUNTER — Telehealth: Payer: Self-pay | Admitting: Cardiology

## 2014-07-15 NOTE — Telephone Encounter (Signed)
Spoke with Caryl Pina at [facility].  She states patient has specific questions regarding how much fluid he should drink in a day.  She reports again that patient has been getting leg cramping at night about once weekly.   Adv that according to the last office note no fluid restriction is mentioned.   He is due to return to Dr. Martinique in November.

## 2014-07-15 NOTE — Telephone Encounter (Signed)
New message      Pt want to know if he is on a fluid restriction and how much fluid can he consume daily?  Pt is getting leg cramps at night on an average of once a week.  OK to call pt and give him this info

## 2014-07-16 NOTE — Telephone Encounter (Signed)
Returned call to patient no answer.Left message on personal voice mail received a message you needed to know how much fluid to drink per day.Spoke to [place] he advised drink less than 1.5 liters per day.Advised to call back if you have any questions.

## 2014-07-17 ENCOUNTER — Ambulatory Visit (INDEPENDENT_AMBULATORY_CARE_PROVIDER_SITE_OTHER): Payer: Medicare Other | Admitting: *Deleted

## 2014-07-17 ENCOUNTER — Encounter: Payer: Self-pay | Admitting: Internal Medicine

## 2014-07-17 DIAGNOSIS — R55 Syncope and collapse: Secondary | ICD-10-CM

## 2014-07-20 LAB — CUP PACEART REMOTE DEVICE CHECK
Date Time Interrogation Session: 20160606003850
MDC IDC SET ZONE DETECTION INTERVAL: 3000 ms
Zone Setting Detection Interval: 2000 ms
Zone Setting Detection Interval: 390 ms

## 2014-07-20 NOTE — Progress Notes (Signed)
Loop recorder 

## 2014-07-24 ENCOUNTER — Encounter: Payer: Self-pay | Admitting: Internal Medicine

## 2014-07-28 ENCOUNTER — Encounter: Payer: Self-pay | Admitting: Internal Medicine

## 2014-07-30 ENCOUNTER — Telehealth: Payer: Self-pay

## 2014-07-30 ENCOUNTER — Encounter: Payer: Self-pay | Admitting: Internal Medicine

## 2014-07-30 NOTE — Telephone Encounter (Signed)
Received a call from patient he stated he had chest pain this morning at 3:00 am.Stated pain woke him up lasted appox 10 min.He took 2 81 mg aspirin.Feels good now, no chest pain.Stated he wanted to know if he needs to see Dr.Jordan.Advised Dr.Jordan out of office this week.Advised if he continues to have chest pain go to ER.Advised I will let Dr.Jordan know.

## 2014-08-12 ENCOUNTER — Telehealth: Payer: Self-pay | Admitting: *Deleted

## 2014-08-12 NOTE — Telephone Encounter (Signed)
LMOVM w/ my direct #.  Re: pause episode -- have symptoms?

## 2014-08-15 ENCOUNTER — Encounter (HOSPITAL_COMMUNITY): Payer: Self-pay | Admitting: *Deleted

## 2014-08-15 ENCOUNTER — Emergency Department (HOSPITAL_COMMUNITY): Payer: Medicare Other

## 2014-08-15 ENCOUNTER — Emergency Department (HOSPITAL_COMMUNITY)
Admission: EM | Admit: 2014-08-15 | Discharge: 2014-08-15 | Disposition: A | Discharge status: Home / Self Care | Payer: Medicare Other | Attending: Emergency Medicine | Admitting: Emergency Medicine

## 2014-08-15 DIAGNOSIS — Z79899 Other long term (current) drug therapy: Secondary | ICD-10-CM | POA: Diagnosis not present

## 2014-08-15 DIAGNOSIS — Z87448 Personal history of other diseases of urinary system: Secondary | ICD-10-CM | POA: Diagnosis not present

## 2014-08-15 DIAGNOSIS — R011 Cardiac murmur, unspecified: Secondary | ICD-10-CM | POA: Insufficient documentation

## 2014-08-15 DIAGNOSIS — I4891 Unspecified atrial fibrillation: Secondary | ICD-10-CM | POA: Insufficient documentation

## 2014-08-15 DIAGNOSIS — Z87891 Personal history of nicotine dependence: Secondary | ICD-10-CM | POA: Insufficient documentation

## 2014-08-15 DIAGNOSIS — R079 Chest pain, unspecified: Secondary | ICD-10-CM

## 2014-08-15 DIAGNOSIS — E780 Pure hypercholesterolemia: Secondary | ICD-10-CM | POA: Diagnosis not present

## 2014-08-15 DIAGNOSIS — Z87442 Personal history of urinary calculi: Secondary | ICD-10-CM | POA: Insufficient documentation

## 2014-08-15 DIAGNOSIS — Z8719 Personal history of other diseases of the digestive system: Secondary | ICD-10-CM | POA: Diagnosis not present

## 2014-08-15 DIAGNOSIS — I10 Essential (primary) hypertension: Secondary | ICD-10-CM | POA: Insufficient documentation

## 2014-08-15 DIAGNOSIS — I2510 Atherosclerotic heart disease of native coronary artery without angina pectoris: Secondary | ICD-10-CM | POA: Insufficient documentation

## 2014-08-15 LAB — BASIC METABOLIC PANEL
ANION GAP: 8 (ref 5–15)
BUN: 36 mg/dL — ABNORMAL HIGH (ref 6–20)
CALCIUM: 8.8 mg/dL — AB (ref 8.9–10.3)
CHLORIDE: 104 mmol/L (ref 101–111)
CO2: 27 mmol/L (ref 22–32)
CREATININE: 1.71 mg/dL — AB (ref 0.61–1.24)
GFR calc Af Amer: 41 mL/min — ABNORMAL LOW (ref >60)
GFR calc non Af Amer: 35 mL/min — ABNORMAL LOW (ref >60)
Glucose, Bld: 94 mg/dL (ref 65–99)
Potassium: 4.2 mmol/L (ref 3.5–5.1)
Sodium: 139 mmol/L (ref 135–145)

## 2014-08-15 LAB — CBC
HEMATOCRIT: 37 % — AB (ref 39.0–52.0)
HEMOGLOBIN: 12.2 g/dL — AB (ref 13.0–17.0)
MCH: 31 pg (ref 26.0–34.0)
MCHC: 33 g/dL (ref 30.0–36.0)
MCV: 93.9 fL (ref 78.0–100.0)
Platelets: 140 10*3/uL — ABNORMAL LOW (ref 150–400)
RBC: 3.94 MIL/uL — ABNORMAL LOW (ref 4.22–5.81)
RDW: 13.7 % (ref 11.5–15.5)
WBC: 6.2 10*3/uL (ref 4.0–10.5)

## 2014-08-15 LAB — I-STAT TROPONIN, ED: Troponin i, poc: 0.03 ng/mL (ref 0.00–0.08)

## 2014-08-15 LAB — TROPONIN I: Troponin I: <0.03 ng/mL (ref <0.031)

## 2014-08-15 MED ORDER — ASPIRIN 81 MG PO CHEW
162.0000 mg | CHEWABLE_TABLET | Freq: Once | ORAL | Status: AC
  Administered 2014-08-15: 162 mg via ORAL
  Filled 2014-08-15: qty 2

## 2014-08-15 NOTE — ED Notes (Signed)
Pt in from home via [facility], per report pt took 162 mg ASA, pt took x 1 SL nitro with no CP upon arrival to ED, pt A&O x4, follows commands, speaks in complete sentences, pt reported initial substernal CP that radiates into L jaw, denies SOB, n/v/d, pt has implanted [employer] monitor

## 2014-08-15 NOTE — ED Provider Notes (Signed)
CSN: [id number]     Arrival date & time 08/15/14  1132 History   First MD Initiated Contact with Patient 08/15/14 1143     Chief Complaint  Patient presents with  . Chest Pain     (Consider location/radiation/quality/duration/timing/severity/associated sxs/prior Treatment) Patient is a 81 y.o. male presenting with chest pain.  Chest Pain Pain location:  L chest Pain quality: aching   Pain radiates to:  L jaw Pain severity:  Moderate Onset quality:  Sudden Duration:  10 minutes Timing:  Constant Progression:  Resolved Chronicity:  Recurrent Context comment:  Pt was in an argument with his CPA Relieved by:  Nitroglycerin and aspirin Worsened by:  Nothing tried Associated symptoms: dizziness   Associated symptoms: no diaphoresis, no fever, no nausea, no shortness of breath and not vomiting     Past Medical History  Diagnosis Date  . Coronary artery disease     w remote anterior myocardial infarction (1998)  . Hypertension   . Renal insufficiency   . Aortic stenosis   . Rectal polyp   . Hypercholesteremia   . Kidney stones   . 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 Hypercalcemia 12/26/2011  . Afib   . Syncope 05/18/2013   Past Surgical History  Procedure Laterality Date  . Cardiac catheterization  08/18/2008    LMain 20, LAD stent 20 ISR, CFX 10, RCA 20, EF 45  . Umbilical hernia repair    . Inguinal hernia repair      left  . Orif femur fracture      right  . Cataract extraction    . Coronary stent placement  07/19/1996    Proximal LAD  . Parathyroidectomy    . Loop recorder implant  05-19-13    MDT LinQ implanted by Dr Lovena Le for syncope following negative EPS  . Electrophysiology study  05-19-13    EPS for syncope without inducible arrhythmias by Dr Lovena Le  . Electrophysiology study N/A 05/19/2013    Procedure: ELECTROPHYSIOLOGY STUDY;  Surgeon: Evans Lance, MD;  Location: [facility] CATH LAB;  Service: Cardiovascular;  Laterality: N/A;  . Left heart catheterization with  coronary angiogram N/A 05/19/2013    Procedure: LEFT HEART CATHETERIZATION WITH CORONARY ANGIOGRAM;  Surgeon: Peter M Martinique, MD;  Location: [facility] CATH LAB;  Service: Cardiovascular;  Laterality: N/A;   No family history on file. History  Substance Use Topics  . Smoking status: Former Smoker -- 1.00 packs/day for 45 years    Types: Cigarettes    Quit date: 07/19/1996  . Smokeless tobacco: Never Used  . Alcohol Use: No    Review of Systems  Constitutional: Negative for fever and diaphoresis.  Respiratory: Negative for shortness of breath.   Cardiovascular: Positive for chest pain.  Gastrointestinal: Negative for nausea and vomiting.  Neurological: Positive for dizziness.  All other systems reviewed and are negative.     Allergies  Ibandronic acid; Risedronate sodium; and Flomax  Home Medications   Prior to Admission medications   Medication Sig Start Date End Date Taking? Authorizing Provider  aspirin 325 MG EC tablet Take 325 mg by mouth every morning.    Yes Historical Provider, MD  aspirin 81 MG tablet Take 162 mg by mouth once.   Yes Historical Provider, MD  atorvastatin (LIPITOR) 40 MG tablet Take 1 tablet by mouth  daily 03/09/14  Yes Peter M Martinique, MD  furosemide (LASIX) 40 MG tablet Take 1 tablet by mouth in  the morning and one-half  tablet by mouth in  the  evening 03/09/14  Yes Peter M Martinique, MD  HYDROcodone-acetaminophen (NORCO/VICODIN) 5-325 MG per tablet Take 1 tablet by mouth every 6 (six) hours as needed for moderate pain.   Yes Historical Provider, MD  nitroGLYCERIN (NITROSTAT) 0.4 MG SL tablet Place 1 tablet (0.4 mg total) under the tongue every 5 (five) minutes as needed for chest pain. 12/26/12  Yes Peter M Martinique, MD  Oyster Shell (OYSTER CALCIUM) 500 MG TABS Take 500 mg of elemental calcium by mouth daily.    Yes Historical Provider, MD  potassium chloride SA (K-DUR,KLOR-CON) 20 MEQ tablet Take 1 tablet by mouth  daily 07/07/14  Yes Peter M Martinique, MD   BP 139/59  mmHg  Pulse 44  Temp(Src) 98.3 F (36.8 C) (Oral)  Resp 15  SpO2 98% Physical Exam  Constitutional: He is oriented to person, place, and time. He appears well-developed and well-nourished. No distress.  HENT:  Head: Normocephalic and atraumatic.  Mouth/Throat: Oropharynx is clear and moist.  Eyes: Conjunctivae are normal. Pupils are equal, round, and reactive to light. No scleral icterus.  Neck: Neck supple.  Cardiovascular: Regular rhythm and intact distal pulses.  Bradycardia present.   Murmur heard.  Systolic murmur is present with a grade of 2/6  Pulmonary/Chest: Effort normal and breath sounds normal. No stridor. No respiratory distress. He has no wheezes. He has no rales.  Abdominal: Soft. He exhibits no distension. There is no tenderness.  Musculoskeletal: Normal range of motion. He exhibits no edema.  Neurological: He is alert and oriented to person, place, and time.  Skin: Skin is warm and dry. No rash noted.  Psychiatric: He has a normal mood and affect. His behavior is normal.  Nursing note and vitals reviewed.   ED Course  Procedures (including critical care time) Labs Review Labs Reviewed  CBC - Abnormal; Notable for the following:    RBC 3.94 (*)    Hemoglobin 12.2 (*)    HCT 37.0 (*)    Platelets 140 (*)    All other components within normal limits  BASIC METABOLIC PANEL - Abnormal; Notable for the following:    BUN 36 (*)    Creatinine, Ser 1.71 (*)    Calcium 8.8 (*)    GFR calc non Af Amer 35 (*)    GFR calc Af Amer 41 (*)    All other components within normal limits  TROPONIN I  Randolm Idol, ED    Imaging Review Dg Chest Port 1 View  08/15/2014   CLINICAL DATA:  Left chest pain x1 hour. Coronary artery disease, aortic stenosis, previous tobacco abuse  EXAM: PORTABLE CHEST - 1 VIEW  COMPARISON:  03/12/2014  FINDINGS: Implanted event monitor to the left of midline as before. Coarse interstitial markings in the lung bases. No confluent airspace  consolidation or overt edema. No definite effusion although the lateral costophrenic angles are excluded. Heart size within normal limits. Tortuous thoracic aorta. No pneumothorax. Visualized skeletal structures are unremarkable.  IMPRESSION: 1. No acute disease   Electronically Signed   By: Lucrezia Europe M.D.   On: 08/15/2014 12:29     EKG Interpretation   Date/Time:  Saturday August 15 2014 11:35:20 EDT Ventricular Rate:  47 PR Interval:  185 QRS Duration: 106 QT Interval:  494 QTC Calculation: 437 R Axis:   -63 Text Interpretation:  Sinus bradycardia Incomplete RBBB and LAFB Anterior  infarct, old Nonspecific T abnormalities, lateral leads Baseline wander in  lead(s) III No significant change was found  Confirmed by [facility]  MD, TREY  ([id number]) on 08/15/2014 11:54:57 AM      MDM   Final diagnoses:  Chest pain, unspecified chest pain type    81 year old male with a brief episode of chest pain during an argument. Chest pain-free prior to my evaluation. Last year, he had a catheterization, which showed no greater than 20% stenosis.  His pain was somewhat atypical, without nausea, diaphoresis, or shortness of breath. EKG unchanged. Chest x-ray unremarkable. Initial troponin negative.  Discuss his case with Dr. Mare Ferrari, cardiology.  We felt that a negative delta troponin would be adequate to risk stratify him to outpatient follow-up.  Delta trop was negative.  dc'd home.advised close cardiology follow up.    Serita Grit, MD 08/16/14 [phone]

## 2014-08-15 NOTE — Discharge Instructions (Signed)

## 2014-08-16 ENCOUNTER — Encounter: Payer: Self-pay | Admitting: Internal Medicine

## 2014-08-17 ENCOUNTER — Telehealth: Payer: Self-pay | Admitting: Cardiology

## 2014-08-17 ENCOUNTER — Ambulatory Visit (INDEPENDENT_AMBULATORY_CARE_PROVIDER_SITE_OTHER): Payer: Medicare Other | Admitting: *Deleted

## 2014-08-17 DIAGNOSIS — R55 Syncope and collapse: Secondary | ICD-10-CM | POA: Diagnosis not present

## 2014-08-17 NOTE — Telephone Encounter (Signed)
Close encounter 

## 2014-08-18 ENCOUNTER — Encounter: Payer: Self-pay | Admitting: Internal Medicine

## 2014-08-18 NOTE — Progress Notes (Signed)
Loop recorder 

## 2014-08-26 ENCOUNTER — Encounter: Payer: Medicare Other | Admitting: Nurse Practitioner

## 2014-08-30 ENCOUNTER — Emergency Department (HOSPITAL_COMMUNITY): Payer: Medicare Other

## 2014-08-30 ENCOUNTER — Encounter (HOSPITAL_COMMUNITY): Payer: Self-pay | Admitting: Emergency Medicine

## 2014-08-30 ENCOUNTER — Emergency Department (HOSPITAL_COMMUNITY)
Admission: EM | Admit: 2014-08-30 | Discharge: 2014-08-30 | Disposition: A | Discharge status: Home / Self Care | Payer: Medicare Other | Attending: Emergency Medicine | Admitting: Emergency Medicine

## 2014-08-30 DIAGNOSIS — Z9889 Other specified postprocedural states: Secondary | ICD-10-CM | POA: Insufficient documentation

## 2014-08-30 DIAGNOSIS — Z79899 Other long term (current) drug therapy: Secondary | ICD-10-CM | POA: Insufficient documentation

## 2014-08-30 DIAGNOSIS — I2510 Atherosclerotic heart disease of native coronary artery without angina pectoris: Secondary | ICD-10-CM | POA: Insufficient documentation

## 2014-08-30 DIAGNOSIS — Z7982 Long term (current) use of aspirin: Secondary | ICD-10-CM | POA: Diagnosis not present

## 2014-08-30 DIAGNOSIS — Y9389 Activity, other specified: Secondary | ICD-10-CM | POA: Insufficient documentation

## 2014-08-30 DIAGNOSIS — Z86018 Personal history of other benign neoplasm: Secondary | ICD-10-CM | POA: Diagnosis not present

## 2014-08-30 DIAGNOSIS — I10 Essential (primary) hypertension: Secondary | ICD-10-CM | POA: Insufficient documentation

## 2014-08-30 DIAGNOSIS — Z8719 Personal history of other diseases of the digestive system: Secondary | ICD-10-CM | POA: Diagnosis not present

## 2014-08-30 DIAGNOSIS — Z9861 Coronary angioplasty status: Secondary | ICD-10-CM | POA: Insufficient documentation

## 2014-08-30 DIAGNOSIS — I4891 Unspecified atrial fibrillation: Secondary | ICD-10-CM | POA: Insufficient documentation

## 2014-08-30 DIAGNOSIS — Y998 Other external cause status: Secondary | ICD-10-CM | POA: Insufficient documentation

## 2014-08-30 DIAGNOSIS — S6990XA Unspecified injury of unspecified wrist, hand and finger(s), initial encounter: Secondary | ICD-10-CM | POA: Diagnosis not present

## 2014-08-30 DIAGNOSIS — Y9289 Other specified places as the place of occurrence of the external cause: Secondary | ICD-10-CM | POA: Insufficient documentation

## 2014-08-30 DIAGNOSIS — E780 Pure hypercholesterolemia: Secondary | ICD-10-CM | POA: Diagnosis not present

## 2014-08-30 DIAGNOSIS — Z87442 Personal history of urinary calculi: Secondary | ICD-10-CM | POA: Diagnosis not present

## 2014-08-30 DIAGNOSIS — R079 Chest pain, unspecified: Secondary | ICD-10-CM | POA: Insufficient documentation

## 2014-08-30 DIAGNOSIS — X58XXXA Exposure to other specified factors, initial encounter: Secondary | ICD-10-CM | POA: Insufficient documentation

## 2014-08-30 DIAGNOSIS — Z87891 Personal history of nicotine dependence: Secondary | ICD-10-CM | POA: Insufficient documentation

## 2014-08-30 LAB — BASIC METABOLIC PANEL
ANION GAP: 6 (ref 5–15)
BUN: 38 mg/dL — ABNORMAL HIGH (ref 6–20)
CALCIUM: 9.8 mg/dL (ref 8.9–10.3)
CO2: 31 mmol/L (ref 22–32)
Chloride: 105 mmol/L (ref 101–111)
Creatinine, Ser: 1.6 mg/dL — ABNORMAL HIGH (ref 0.61–1.24)
GFR calc non Af Amer: 38 mL/min — ABNORMAL LOW (ref >60)
GFR, EST AFRICAN AMERICAN: 44 mL/min — AB (ref >60)
Glucose, Bld: 104 mg/dL — ABNORMAL HIGH (ref 65–99)
Potassium: 4.7 mmol/L (ref 3.5–5.1)
Sodium: 142 mmol/L (ref 135–145)

## 2014-08-30 LAB — CBC
HCT: 41.3 % (ref 39.0–52.0)
HEMOGLOBIN: 14 g/dL (ref 13.0–17.0)
MCH: 31.5 pg (ref 26.0–34.0)
MCHC: 33.9 g/dL (ref 30.0–36.0)
MCV: 92.8 fL (ref 78.0–100.0)
Platelets: 184 10*3/uL (ref 150–400)
RBC: 4.45 MIL/uL (ref 4.22–5.81)
RDW: 13.9 % (ref 11.5–15.5)
WBC: 7.4 10*3/uL (ref 4.0–10.5)

## 2014-08-30 LAB — I-STAT TROPONIN, ED
Troponin i, poc: 0.02 ng/mL (ref 0.00–0.08)
Troponin i, poc: 0 ng/mL (ref 0.00–0.08)

## 2014-08-30 LAB — BRAIN NATRIURETIC PEPTIDE: B Natriuretic Peptide: 246.2 pg/mL — ABNORMAL HIGH (ref 0.0–100.0)

## 2014-08-30 NOTE — Discharge Instructions (Signed)

## 2014-08-30 NOTE — ED Provider Notes (Signed)
CSN: [id number]     Arrival date & time 08/30/14  1445 History   First MD Initiated Contact with Patient 08/30/14 1502     Chief Complaint  Patient presents with  . Chest Pain   (Consider location/radiation/quality/duration/timing/severity/associated sxs/prior Treatment) HPI  Patient is a 81 year old male with a history of CAD status post 2 stents in 1998, aortic stenosis, renal insufficiency, A. fib presented today for chest pain. Patient was seen roughly 2 weeks ago for similar episode where he became upset and had a chest tightness. He was evaluated in the emergency department had negative cardiac workup at that time. Today patient reports she was at the grocery store slammed his finger in the cart became upset and had similar chest pain. Describes it as a tightness substernally radiating to his neck. He take nitroglycerin with alleviation of the pain. Patient also reports taking aspirin. Reported as 6 out of 10, and lasted roughly 2 hours. Rating to the neck and left arm. Denies any aggravating factors. Denies any history of stable angina while exercising or mowing the lawn.  Past Medical History  Diagnosis Date  . Coronary artery disease     w remote anterior myocardial infarction (1998)  . Hypertension   . Renal insufficiency   . Aortic stenosis   . Rectal polyp   . Hypercholesteremia   . Kidney stones   . 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 Hypercalcemia 12/26/2011  . Afib   . Syncope 05/18/2013   Past Surgical History  Procedure Laterality Date  . Cardiac catheterization  08/18/2008    LMain 20, LAD stent 20 ISR, CFX 10, RCA 20, EF 45  . Umbilical hernia repair    . Inguinal hernia repair      left  . Orif femur fracture      right  . Cataract extraction    . Coronary stent placement  07/19/1996    Proximal LAD  . Parathyroidectomy    . Loop recorder implant  05-19-13    MDT LinQ implanted by Dr Lovena Le for syncope following negative EPS  . Electrophysiology study  05-19-13     EPS for syncope without inducible arrhythmias by Dr Lovena Le  . Electrophysiology study N/A 05/19/2013    Procedure: ELECTROPHYSIOLOGY STUDY;  Surgeon: Evans Lance, MD;  Location: [facility] CATH LAB;  Service: Cardiovascular;  Laterality: N/A;  . Left heart catheterization with coronary angiogram N/A 05/19/2013    Procedure: LEFT HEART CATHETERIZATION WITH CORONARY ANGIOGRAM;  Surgeon: Peter M Martinique, MD;  Location: [facility] CATH LAB;  Service: Cardiovascular;  Laterality: N/A;   No family history on file. History  Substance Use Topics  . Smoking status: Former Smoker -- 1.00 packs/day for 45 years    Types: Cigarettes    Quit date: 07/19/1996  . Smokeless tobacco: Never Used  . Alcohol Use: No    Review of Systems  Constitutional: Negative for fever and chills.  HENT: Negative for congestion and sore throat.   Eyes: Negative for pain.  Respiratory: Negative for cough and shortness of breath.   Cardiovascular: Positive for chest pain. Negative for palpitations and leg swelling.  Gastrointestinal: Negative for nausea, vomiting, abdominal pain and diarrhea.  Endocrine: Negative.   Genitourinary: Negative for flank pain.  Musculoskeletal: Negative for back pain and neck pain.       Finger pain   Skin: Negative for rash.  Allergic/Immunologic: Negative.   Neurological: Negative for dizziness, syncope and light-headedness.  Psychiatric/Behavioral: Negative for confusion.   Allergies  Ibandronic  acid; Risedronate sodium; and Flomax  Home Medications   Prior to Admission medications   Medication Sig Start Date End Date Taking? Authorizing Provider  aspirin 325 MG EC tablet Take 325 mg by mouth every morning.     Historical Provider, MD  aspirin 81 MG tablet Take 162 mg by mouth once.    Historical Provider, MD  atorvastatin (LIPITOR) 40 MG tablet Take 1 tablet by mouth  daily 03/09/14   Peter M Martinique, MD  furosemide (LASIX) 40 MG tablet Take 1 tablet by mouth in  the morning and one-half   tablet by mouth in the  evening 03/09/14   Peter M Martinique, MD  HYDROcodone-acetaminophen (NORCO/VICODIN) 5-325 MG per tablet Take 1 tablet by mouth every 6 (six) hours as needed for moderate pain.    Historical Provider, MD  nitroGLYCERIN (NITROSTAT) 0.4 MG SL tablet Place 1 tablet (0.4 mg total) under the tongue every 5 (five) minutes as needed for chest pain. 12/26/12   Peter M Martinique, MD  Oyster Shell (OYSTER CALCIUM) 500 MG TABS Take 500 mg of elemental calcium by mouth daily.     Historical Provider, MD  potassium chloride SA (K-DUR,KLOR-CON) 20 MEQ tablet Take 1 tablet by mouth  daily 07/07/14   Peter M Martinique, MD   BP 121/71 mmHg  Pulse 52  Temp(Src) 98.1 F (36.7 C) (Oral)  Resp 17  Ht 5\' 10"  (1.778 m)  Wt 188 lb 5 oz (85.418 kg)  BMI 27.02 kg/m2  SpO2 95% Physical Exam  Constitutional: He is oriented to person, place, and time. He appears well-developed and well-nourished.  HENT:  Head: Normocephalic and atraumatic.  Eyes: Conjunctivae and EOM are normal. Pupils are equal, round, and reactive to light.  Neck: Normal range of motion. Neck supple.  Cardiovascular: Normal rate, regular rhythm, S1 normal, S2 normal, normal heart sounds and intact distal pulses.   Pulses:      Radial pulses are 2+ on the right side, and 2+ on the left side.  Pulmonary/Chest: Effort normal and breath sounds normal. No accessory muscle usage. No respiratory distress. He has no decreased breath sounds.  Abdominal: Soft. Bowel sounds are normal. There is no tenderness.  Musculoskeletal: Normal range of motion.  Neurological: He is alert and oriented to person, place, and time. He has normal reflexes. No cranial nerve deficit.  Skin: Skin is warm and dry.    ED Course  Procedures (including critical care time) Labs Review Labs Reviewed  BASIC METABOLIC PANEL  CBC    Imaging Review No results found.   EKG Interpretation   Date/Time:  Sunday August 30 2014 14:46:03 EDT Ventricular Rate:  54 PR  Interval:  181 QRS Duration: 106 QT Interval:  445 QTC Calculation: 422 R Axis:   -65 Text Interpretation:  Sinus rhythm Probable left atrial enlargement LAD,  consider LAFB or inferior infarct Left ventricular hypertrophy Anterior  infarct, old agree. no sig change from ols Confirmed by Johnney Killian, MD,  Jeannie Done [phone]) on 08/30/2014 3:06:14 PM      MDM   Final diagnoses:  None   Patient is a 81 year old male with a history of CAD status post 2 stents in 1998, aortic stenosis, renal insufficiency, A. fib presented today for chest pain.  Ddx ACS, PE, dissection, arrhythmia, PNA, PTX, costochondritis, msk strain, anxiety   On initial evaluation pt HDS in NAD.  No continued CP after taken nitro at home.  Reports taking full ASA.  EKG with no new ischemic changes.  Troponin and delta troponin negative.  No widened mediastinum, PNA, or PTX on CXR.  Equal pulses and doubt dissection.  No TTP and doubt costochondritis and doubt msk strain.  Advised pt admission for observation was option and he reported would prefer to call cardiologist in morning and have stress test.  Advised to call as soon as possible for stress test and given immediate return precautions.  Pt reports will call cardiologist in morning for test.   If performed, labs, EKGs, and imaging were reviewed/interpreted by myself and my attending and incorporated into medical decision making.  Discussed pertinent finding with patient or caregiver prior to discharge with no further questions.  Immediate return precautions given and pt or caregiver reports understanding.  Pt care supervised by my attending Dr. Johnney Killian.   Geronimo Boot, MD PGY-2  Emergency Medicine      Geronimo Boot, MD 08/31/14 1246  Charlesetta Shanks, MD 09/06/14 [phone]

## 2014-08-30 NOTE — ED Notes (Signed)
Received pt from home with c/o while out at the supermarket, slammed his finger on the cart and became upset causing pressure in the center of his chest 45 Mins PTA. Pt went home and took 2 Nitro and 324 mg of ASA pain relieved. Pt had similar episode 2 weeks ago and was seen here. Pt reports history or episodes and that he usually takes Nitro and ASA with relief, today symptoms took longer to subside.

## 2014-08-31 ENCOUNTER — Telehealth: Payer: Self-pay | Admitting: Cardiology

## 2014-08-31 NOTE — Telephone Encounter (Signed)
Pt is calling in wanting to speak with a nurse about a stress test that the doctor is wanting to order . He is unsure about the test and would like to speak with someone. Please call  Thanks

## 2014-08-31 NOTE — Telephone Encounter (Signed)
Follow Up  Pt called back request that the same message goes to NP, Macarthur Critchley nurse as well. Informed pt that I would re-route the message to NL triage as a second attempt from the pt.

## 2014-08-31 NOTE — Telephone Encounter (Signed)
Spoke to patient regarding recommendation for f/u post ED visit.  He had episode of CP - he believes this was stress brought on by slamming finger in grocery cart. He had 2x nitro and 324mg  ASA prior to ED arrival. Cleared for CP concerns w/ 2 neg troponins, EKG.  Notes similar complaint w/ ED visit 2 weeks ago.  Pt denies active CP or other concerns today. Pt wanted instruction for f/u. Has appt w/ Truitt Merle 9/7 - does he need to be seen sooner? Any indication for further tests?  Last myoview stress test April 2014.  Dr. Martinique on vacation - will route to [facility] to advise.

## 2014-09-01 NOTE — Telephone Encounter (Signed)
Truitt Merle, NP is also on vacation.   Arrange FU office visit in the next 1-2 weeks for FU. Can be seen by Cecille Rubin or Dr. [name] or on Flex schedule if nothing available. Richardson Dopp, PA-C   09/01/2014 4:54 PM

## 2014-09-02 ENCOUNTER — Telehealth: Payer: Self-pay | Admitting: Cardiology

## 2014-09-02 NOTE — Telephone Encounter (Signed)
Closed encounter °

## 2014-09-02 NOTE — Telephone Encounter (Signed)
msg to scheduling sent for arrangement of visit.

## 2014-09-11 ENCOUNTER — Encounter: Payer: Self-pay | Admitting: Internal Medicine

## 2014-09-11 ENCOUNTER — Encounter: Payer: Self-pay | Admitting: Cardiology

## 2014-09-11 ENCOUNTER — Telehealth: Payer: Self-pay | Admitting: Cardiology

## 2014-09-14 ENCOUNTER — Encounter: Payer: Self-pay | Admitting: Nurse Practitioner

## 2014-09-14 ENCOUNTER — Ambulatory Visit (INDEPENDENT_AMBULATORY_CARE_PROVIDER_SITE_OTHER): Payer: Medicare Other | Admitting: Nurse Practitioner

## 2014-09-14 VITALS — BP 150/90 | HR 51 | Ht 70.0 in | Wt 196.0 lb

## 2014-09-14 DIAGNOSIS — R0789 Other chest pain: Secondary | ICD-10-CM | POA: Diagnosis not present

## 2014-09-14 LAB — CUP PACEART REMOTE DEVICE CHECK: MDC IDC SESS DTM: 20160710040500

## 2014-09-14 NOTE — Patient Instructions (Addendum)
We will be checking the following labs today - NONE   Medication Instructions:    Continue with your current medicines.     Testing/Procedures To Be Arranged:  N/A  Follow-Up:   See Dr. Martinique as planned.     Other Special Instructions:   N/A  Call the [place] office at [phone] if you have any questions, problems or concerns.

## 2014-09-14 NOTE — Telephone Encounter (Signed)
Close encounter 

## 2014-09-14 NOTE — Progress Notes (Signed)
CARDIOLOGY OFFICE NOTE  Date:  09/14/2014    Jonathan Acevedo Date of Birth: 09/28/1928 Medical Record #[id number]  PCP:  Reita Cliche, MD  Cardiologist:  Martinique    Chief Complaint  Patient presents with  . Chest Pain    Work in visit - seen for Dr. Martinique    History of Present Illness: Jonathan Acevedo is a 81 y.o. male who presents today for a work in visit. Seen for Dr. Martinique. He has a history of CAD with remote MI and prior stent to the LAD, HTN, CKD, AS and HLD. He was hospitalized in April of 2014 with chest pain. A subsequent stress Myoview study showed extensive scar of the anterior apex and inferior wall. Ejection fraction is 44%. This actually represented an improvement from echocardiogram in 2012 at which time his ejection fraction was 35-40%. In Ocober 2014 he presented with a prolonged episode of chest pain. Echo showed no change in his AS - medical management was continued.   He was admitted twice back in April 2015 with syncope that resulted in a fractured right ankle and acute systolic HF. Cardiac cath showed no obstructive disease and mild to moderate AS. He had a loop recorder placed at that time. Syncope felt to be related to Flomax. Unable to take ACEi due to CKD and hypotension.  In July 2015 he was readmitted with acute on chronic CHF. Diuresed with good result. No recurrent syncope.   Last seen back in May of 2016 by Dr. Martinique, felt to be doing ok.  Has been to the ER twice in July - this was for chest pain.   Comes in today. Here alone. He tells me that things "got stressful". His wife was sick. Dog was sick. Trying to sell some real estate. Mashed his hand in grocery cart. He admits he got pretty frustrated. He now feels fine. Back to swimming 10 to 12 laps every day and doing fine. No more chest pain. No passing out spells. Weight has been stable "as long as I am good". Not dizzy. Tries to be active as much as he can. His BP diary from home looks great.    Past Medical History  Diagnosis Date  . Coronary artery disease     w remote anterior myocardial infarction (1998)  . Hypertension   . Renal insufficiency   . Aortic stenosis   . Rectal polyp   . Hypercholesteremia   . Kidney stones   . 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 Hypercalcemia 12/26/2011  . Afib   . Syncope 05/18/2013    Past Surgical History  Procedure Laterality Date  . Cardiac catheterization  08/18/2008    LMain 20, LAD stent 20 ISR, CFX 10, RCA 20, EF 45  . Umbilical hernia repair    . Inguinal hernia repair      left  . Orif femur fracture      right  . Cataract extraction    . Coronary stent placement  07/19/1996    Proximal LAD  . Parathyroidectomy    . Loop recorder implant  05-19-13    MDT LinQ implanted by Dr Lovena Le for syncope following negative EPS  . Electrophysiology study  05-19-13    EPS for syncope without inducible arrhythmias by Dr Lovena Le  . Electrophysiology study N/A 05/19/2013    Procedure: ELECTROPHYSIOLOGY STUDY;  Surgeon: Evans Lance, MD;  Location: [facility] CATH LAB;  Service: Cardiovascular;  Laterality: N/A;  . Left heart catheterization  with coronary angiogram N/A 05/19/2013    Procedure: LEFT HEART CATHETERIZATION WITH CORONARY ANGIOGRAM;  Surgeon: Peter M Martinique, MD;  Location: [facility] CATH LAB;  Service: Cardiovascular;  Laterality: N/A;     Medications: Current Outpatient Prescriptions  Medication Sig Dispense Refill  . aspirin 325 MG EC tablet Take 325 mg by mouth every morning.     Marland Kitchen atorvastatin (LIPITOR) 40 MG tablet Take 1 tablet by mouth  daily 90 tablet 2  . Cholecalciferol (VITAMIN D PO) Take 1 tablet by mouth daily.    . furosemide (LASIX) 20 MG tablet Take 20 mg by mouth 2 (two) times daily. (40 mg ) am and (20 mg ) pm    . HYDROcodone-acetaminophen (NORCO/VICODIN) 5-325 MG per tablet Take 1 tablet by mouth every 4 (four) hours as needed for moderate pain.     . nitroGLYCERIN (NITROSTAT) 0.4 MG SL tablet Place 1 tablet (0.4 mg  total) under the tongue every 5 (five) minutes as needed for chest pain. 25 tablet 3  . Oyster Shell (OYSTER CALCIUM) 500 MG TABS Take 500 mg of elemental calcium by mouth daily.     . potassium chloride SA (K-DUR,KLOR-CON) 20 MEQ tablet Take 1 tablet by mouth  daily 90 tablet 1  . vitamin B-12 (CYANOCOBALAMIN) 500 MCG tablet Take 1,000 mcg by mouth daily.    Marland Kitchen aspirin 81 MG tablet Take 324 mg by mouth once.      No current facility-administered medications for this visit.    Allergies: Allergies  Allergen Reactions  . Ibandronic Acid Other (See Comments)    Muscle Aches-pt denies  . Risedronate Sodium Other (See Comments)    Muscle aches  . Flomax [Tamsulosin Hcl] Other (See Comments)    Syncope     Social History: The patient  reports that he quit smoking about 18 years ago. His smoking use included Cigarettes. He has a 45 pack-year smoking history. He has never used smokeless tobacco. He reports that he does not drink alcohol or use illicit drugs.   Family History: The patient's family history is not on file.   Review of Systems: Please see the history of present illness.   Otherwise, the review of systems is positive for back pain, muscle pain, easy bruising and hearing loss.  Constipated due to pain medicines.  All other systems are reviewed and negative.   Physical Exam: VS:  BP 150/90 mmHg  Pulse 51  Ht 5\' 10"  (1.778 m)  Wt 196 lb (88.905 kg)  BMI 28.12 kg/m2  SpO2 96% .  BMI Body mass index is 28.12 kg/(m^2).  Wt Readings from Last 3 Encounters:  09/14/14 196 lb (88.905 kg)  08/30/14 188 lb 5 oz (85.418 kg)  06/16/14 200 lb 3.2 oz (90.81 kg)   BP by me is 140/80.   General: Pleasant. Well developed, well nourished and in no acute distress.  HEENT: Normal but with poor dentition Neck: Supple, no JVD, carotid bruits, or masses noted.  Cardiac: Regular rate and rhythm. No murmurs, rubs, or gallops. No edema.  Respiratory:  Lungs are clear to auscultation  bilaterally with normal work of breathing.  GI: Soft and nontender.  MS: No deformity or atrophy. Gait and ROM intact. Skin: Warm and dry. Color is normal.  Neuro:  Strength and sensation are intact and no gross focal deficits noted.  Psych: Alert, appropriate and with normal affect.   LABORATORY DATA:  EKG:  EKG is not ordered today.  Lab Results  Component Value  Date   WBC 7.4 08/30/2014   HGB 14.0 08/30/2014   HCT 41.3 08/30/2014   PLT 184 08/30/2014   GLUCOSE 104* 08/30/2014   CHOL 133 11/17/2012   TRIG 105 11/17/2012   HDL 54 11/17/2012   LDLCALC 58 11/17/2012   ALT 14 05/25/2013   AST 17 05/25/2013   NA 142 08/30/2014   K 4.7 08/30/2014   CL 105 08/30/2014   CREATININE 1.60* 08/30/2014   BUN 38* 08/30/2014   CO2 31 08/30/2014   TSH 1.453 Test methodology is 3rd generation TSH 08/17/2008   INR 1.21 05/19/2013   HGBA1C 6.2* 11/16/2012    BNP (last 3 results)  Recent Labs  08/30/14 1607  BNP 246.2*    ProBNP (last 3 results) No results for input(s): PROBNP in the last 8760 hours.   Other Studies Reviewed Today:  Cardiac Catheterization Procedure Note  Name: Jonathan Acevedo MRN: [id number] DOB: 1928-07-23  Procedure: Left Heart Cath, Selective Coronary Angiography, LV angiography  Indication: 81 yo WM with history of remote anterior MI and stenting of the LAD in 1998 presents with CHF, syncope, and PVCs  Procedural Details: The right wrist was prepped, draped, and anesthetized with 1% lidocaine. Using the modified Seldinger technique, a 5 French sheath was introduced into the right radial artery. 3 mg of verapamil was administered through the sheath, weight-based unfractionated heparin was administered intravenously. Standard Judkins catheters were used for selective coronary angiography and left ventriculography. Catheter exchanges were performed over an exchange length guidewire. There were no immediate procedural  complications. A TR band was used for radial hemostasis at the completion of the procedure. The patient was transferred to the post catheterization recovery area for further monitoring.  Procedural Findings: Hemodynamics: AO 141/78 mean 107 mm Hg LV 157/28 mm Hg AV gradient of 19 mm Hg  Coronary angiography: Coronary dominance: right  Left mainstem: 20% ostial disease  Left anterior descending (LAD): There is a segment of stents in the proximal LAD that is widely patent. No obstructive disease.  Left circumflex (LCx): Large vessel with 20% disease in the proximal vessel.   Right coronary artery (RCA): Moderately calcified with 20% disease in the proximal and mid vessel.   Left ventriculography: Left ventricular systolic function is abnormal, there is akinesis of the mid to distal anterior wall and entire apex. LVEF is estimated at 35-40%, there is no significant mitral regurgitation   Final Conclusions:  1. Nonobstructive CAD 2. Moderate to severe LV dysfunction. 3. Mild to moderate aortic stenosis by gradient. 4. Elevated LVEDP.  Recommendations: proceed with EP study. Needs more diuresis.  Ander Slade [facility] 05/19/2013, 4:28 PM         Assessment/Plan:  1. Chest pain - no recurrence - seems like he got overwhelmed with multiple issues - now doing fine. Would favor continued medical management.  2. Chronic systolic HF - he is well compensated clinically. Reinforced importance of sodium restriction. Continue current diuretic dose. He is instructed to take an extra lasix for weight gain or increased swelling.  Not a candidate for beta blockers due to bradycardia. Not a candidate for ACEi due to hypotension and CKD.  3.  CAD -remote anterior MI and stent of LAD. Last cath showed nonobstructive disease.   4.  Syncope - has loop recorder in place - some bradycardia noted but this is his baseline. Prior syncope most likely related to hypotension on Flomax. His remote  monitor does work with recent manual transmission  - he is  switching his phone access. He is due to a check here in the office next month and will bring his remote monitoring for further trouble shooting.   5. CKD- stage 3.   6. AS -mild to moderate. No symptoms.    Current medicines are reviewed with the patient today.  The patient does not have concerns regarding medicines other than what has been noted above.  The following changes have been made:  See above.  Labs/ tests ordered today include:   No orders of the defined types were placed in this encounter.     Disposition:   FU with Dr. Martinique as planned.    Patient is agreeable to this plan and will call if any problems develop in the interim.   Signed: Burtis Junes, RN, ANP-C 09/14/2014 10:25 AM  [facility] [address] [place], [place]  [address] Phone: [phone] Fax: [phone]

## 2014-09-15 ENCOUNTER — Ambulatory Visit (INDEPENDENT_AMBULATORY_CARE_PROVIDER_SITE_OTHER): Payer: Medicare Other | Admitting: *Deleted

## 2014-09-15 DIAGNOSIS — R55 Syncope and collapse: Secondary | ICD-10-CM | POA: Diagnosis not present

## 2014-09-17 NOTE — Progress Notes (Signed)
Loop recorder 

## 2014-09-21 LAB — CUP PACEART REMOTE DEVICE CHECK
Date Time Interrogation Session: 20160810174058
MDC IDC SET ZONE DETECTION INTERVAL: 2000 ms
MDC IDC SET ZONE DETECTION INTERVAL: 3000 ms
Zone Setting Detection Interval: 390 ms

## 2014-09-22 ENCOUNTER — Telehealth: Payer: Self-pay | Admitting: *Deleted

## 2014-09-22 NOTE — Telephone Encounter (Signed)
LMOM requesting call back regarding pause on LINQ transmission.

## 2014-09-22 NOTE — Telephone Encounter (Signed)
Patient returned phone call.  Patient reports he was asymptomatic during the ~3 sec pause detected on 09/18/2014 at 6:40am.  Confirmed patient's appointment for [facility] reprogramming on 10/14/14 at 9:30am.  Patient denies any additional questions or concerns at this time.

## 2014-10-01 ENCOUNTER — Encounter: Payer: Self-pay | Admitting: *Deleted

## 2014-10-01 NOTE — Progress Notes (Signed)
Pause episodes from 09/25/14 and 09/30/14 printed for review.  Episodes appropriate, both pauses ~3 seconds.  Monthly summary reports and ROV on 10/14/2014 in [facility] to extend pause detection to 4.5 seconds and extend brady beats to detect.

## 2014-10-02 ENCOUNTER — Encounter (HOSPITAL_BASED_OUTPATIENT_CLINIC_OR_DEPARTMENT_OTHER): Payer: Self-pay | Admitting: *Deleted

## 2014-10-02 ENCOUNTER — Emergency Department (HOSPITAL_BASED_OUTPATIENT_CLINIC_OR_DEPARTMENT_OTHER)
Admission: EM | Admit: 2014-10-02 | Discharge: 2014-10-02 | Disposition: A | Discharge status: Home / Self Care | Payer: Medicare Other | Attending: Emergency Medicine | Admitting: Emergency Medicine

## 2014-10-02 ENCOUNTER — Telehealth: Payer: Self-pay | Admitting: Cardiology

## 2014-10-02 DIAGNOSIS — Z86018 Personal history of other benign neoplasm: Secondary | ICD-10-CM | POA: Diagnosis not present

## 2014-10-02 DIAGNOSIS — Z79899 Other long term (current) drug therapy: Secondary | ICD-10-CM | POA: Diagnosis not present

## 2014-10-02 DIAGNOSIS — Z8719 Personal history of other diseases of the digestive system: Secondary | ICD-10-CM | POA: Diagnosis not present

## 2014-10-02 DIAGNOSIS — E780 Pure hypercholesterolemia: Secondary | ICD-10-CM | POA: Insufficient documentation

## 2014-10-02 DIAGNOSIS — Z87440 Personal history of urinary (tract) infections: Secondary | ICD-10-CM | POA: Insufficient documentation

## 2014-10-02 DIAGNOSIS — I10 Essential (primary) hypertension: Secondary | ICD-10-CM | POA: Insufficient documentation

## 2014-10-02 DIAGNOSIS — I2510 Atherosclerotic heart disease of native coronary artery without angina pectoris: Secondary | ICD-10-CM | POA: Insufficient documentation

## 2014-10-02 DIAGNOSIS — Z7982 Long term (current) use of aspirin: Secondary | ICD-10-CM | POA: Diagnosis not present

## 2014-10-02 DIAGNOSIS — Z9889 Other specified postprocedural states: Secondary | ICD-10-CM | POA: Insufficient documentation

## 2014-10-02 DIAGNOSIS — F4321 Adjustment disorder with depressed mood: Secondary | ICD-10-CM | POA: Insufficient documentation

## 2014-10-02 DIAGNOSIS — Z87448 Personal history of other diseases of urinary system: Secondary | ICD-10-CM | POA: Insufficient documentation

## 2014-10-02 DIAGNOSIS — Z87891 Personal history of nicotine dependence: Secondary | ICD-10-CM | POA: Insufficient documentation

## 2014-10-02 DIAGNOSIS — F329 Major depressive disorder, single episode, unspecified: Secondary | ICD-10-CM | POA: Diagnosis present

## 2014-10-02 MED ORDER — LORAZEPAM 0.5 MG PO TABS: 0.5000 mg | ORAL_TABLET | Freq: Every day | ORAL | Status: DC

## 2014-10-02 NOTE — ED Notes (Signed)
Pt reports he had a tetanus, pneumonia and flu vaccine within the past few weeks at [facility].

## 2014-10-02 NOTE — Discharge Instructions (Signed)
Grief Reaction Grief is a normal response to the death of someone close to you. Feelings of fear, anger, and guilt can affect almost everyone who loses someone they love. Symptoms of depression are also common. These include problems with sleep, loss of appetite, and lack of energy. These grief reaction symptoms often last for weeks to months after a loss. They may also return during special times that remind you of the person you lost, such as an anniversary or birthday. Anxiety, insomnia, irritability, and deep depression may last beyond the period of normal grief. If you experience these feelings for 6 months or longer, you may have clinical depression. Clinical depression requires further medical attention. If you think that you have clinical depression, you should contact your caregiver. If you have a history of depression or a family history of depression, you are at greater risk of clinical depression. You are also at greater risk of developing clinical depression if the loss was traumatic or the loss was of someone with whom you had unresolved issues.  A grief reaction can become complicated by being blocked. This means being unable to cry or express extreme emotions. This may prolong the grieving period and worsen the emotional effects of the loss. Mourning is a natural event in human life. A healthy grief reaction is one that is not blocked. It requires a time of sadness and readjustment. It is very important to share your sorrow and fear with others, especially close friends and family. Professional counselors and clergy can also help you process your grief. Document Released: 01/23/2005 Document Revised: 06/09/2013 Document Reviewed: 10/03/2005 Exit[employer] Patient Information 2015 [employer]. This information is not intended to replace advice given to you by your health care provider. Make sure you discuss any questions you have with your health care provider.  

## 2014-10-02 NOTE — ED Notes (Signed)
Depression. He had to have his dog he had for 12 years put to sleep yesterday. He can't stop crying. His wife is with him and is afraid he is going to have a heart attack.

## 2014-10-02 NOTE — ED Provider Notes (Signed)
CSN: [id number]     Arrival date & time 10/02/14  1533 History   First MD Initiated Contact with Patient 10/02/14 1555     Chief Complaint  Patient presents with  . Depression     The history is provided by the patient and the spouse. No language interpreter was used.   Jonathan Acevedo presents for evaluation of stress related to his dog dying yesterday.  He had to put his dog of 12 years down yesterday.  He has a hx/o heart failure, CAD and lives at home with his wife.  He denies any history of depression or anxiety, denies SI/HI. He reports that he breaking down and his tears frequently. He is able to sleep or eat. He is very tearful throughout most the day. His wife is concerned that the stress will cause a heart attack. Symptoms are severe and constant.  Past Medical History  Diagnosis Date  . Coronary artery disease     w remote anterior myocardial infarction (1998)  . Hypertension   . Renal insufficiency   . Aortic stenosis   . Rectal polyp   . Hypercholesteremia   . Kidney stones   . 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 Hypercalcemia 12/26/2011  . Afib   . Syncope 05/18/2013   Past Surgical History  Procedure Laterality Date  . Cardiac catheterization  08/18/2008    LMain 20, LAD stent 20 ISR, CFX 10, RCA 20, EF 45  . Umbilical hernia repair    . Inguinal hernia repair      left  . Orif femur fracture      right  . Cataract extraction    . Coronary stent placement  07/19/1996    Proximal LAD  . Parathyroidectomy    . Loop recorder implant  05-19-13    MDT LinQ implanted by Dr Lovena Le for syncope following negative EPS  . Electrophysiology study  05-19-13    EPS for syncope without inducible arrhythmias by Dr Lovena Le  . Electrophysiology study N/A 05/19/2013    Procedure: ELECTROPHYSIOLOGY STUDY;  Surgeon: Evans Lance, MD;  Location: [facility] CATH LAB;  Service: Cardiovascular;  Laterality: N/A;  . Left heart catheterization with coronary angiogram N/A 05/19/2013    Procedure: LEFT  HEART CATHETERIZATION WITH CORONARY ANGIOGRAM;  Surgeon: Peter M Martinique, MD;  Location: [facility] CATH LAB;  Service: Cardiovascular;  Laterality: N/A;   No family history on file. Social History  Substance Use Topics  . Smoking status: Former Smoker -- 1.00 packs/day for 45 years    Types: Cigarettes    Quit date: 07/19/1996  . Smokeless tobacco: Never Used  . Alcohol Use: No    Review of Systems  All other systems reviewed and are negative.     Allergies  Ibandronic acid; Risedronate sodium; and Flomax  Home Medications   Prior to Admission medications   Medication Sig Start Date End Date Taking? Authorizing Provider  aspirin 325 MG EC tablet Take 325 mg by mouth every morning.     Historical Provider, MD  aspirin 81 MG tablet Take 324 mg by mouth once.     Historical Provider, MD  atorvastatin (LIPITOR) 40 MG tablet Take 1 tablet by mouth  daily 03/09/14   Peter M Martinique, MD  Cholecalciferol (VITAMIN D PO) Take 1 tablet by mouth daily.    Historical Provider, MD  furosemide (LASIX) 20 MG tablet Take 20 mg by mouth 2 (two) times daily. (40 mg ) am and (20 mg ) pm  Historical Provider, MD  HYDROcodone-acetaminophen (NORCO/VICODIN) 5-325 MG per tablet Take 1 tablet by mouth every 4 (four) hours as needed for moderate pain.  08/18/14   Historical Provider, MD  LORazepam (ATIVAN) 0.5 MG tablet Take 1 tablet (0.5 mg total) by mouth at bedtime. Take 1/2 to one tablet at night for anxiety/sleep. 10/02/14   Jonathan Reichert, MD  nitroGLYCERIN (NITROSTAT) 0.4 MG SL tablet Place 1 tablet (0.4 mg total) under the tongue every 5 (five) minutes as needed for chest pain. 12/26/12   Peter M Martinique, MD  Oyster Shell (OYSTER CALCIUM) 500 MG TABS Take 500 mg of elemental calcium by mouth daily.     Historical Provider, MD  potassium chloride SA (K-DUR,KLOR-CON) 20 MEQ tablet Take 1 tablet by mouth  daily 07/07/14   Peter M Martinique, MD  vitamin B-12 (CYANOCOBALAMIN) 500 MCG tablet Take 1,000 mcg by mouth daily.     Historical Provider, MD   BP 140/83 mmHg  Pulse 66  Temp(Src) 98.2 F (36.8 C) (Oral)  Resp 20  Wt 196 lb (88.905 kg)  SpO2 97% Physical Exam  Constitutional: He is oriented to person, place, and time. He appears well-developed and well-nourished.  HENT:  Head: Normocephalic and atraumatic.  Cardiovascular: Normal rate and regular rhythm.   Systolic ejection murmur  Pulmonary/Chest: Effort normal and breath sounds normal. No respiratory distress.  Abdominal: Soft. There is no tenderness. There is no rebound and no guarding.  Musculoskeletal: He exhibits no edema or tenderness.  Neurological: He is alert and oriented to person, place, and time.  Skin: Skin is warm and dry.  Psychiatric:  Tearful at times. Clear thought process. Good insight.  Nursing note and vitals reviewed.   ED Course  Procedures (including critical care time) Labs Review   MDM   Final diagnoses:  Grief reaction    Patient here for increased stress after losing his pets. He is not suicidal or homicidal emergency department. He has a clear thought process and logical thoughts. He is having difficulty with coping with the loss of his pets. Providing prescription for Ativan to help with sleep. Discussed with patient's strong warnings regarding taking Ativan as it can make him unsteady on his feet and he cannot drive when taking this medication. Patient has no current symptoms of active heart failure, no current chest pain or shortness of breath. Discussed with patient and wife home care as well as very close return precautions.  Jonathan Reichert, MD 10/02/14 [id number]

## 2014-10-02 NOTE — Telephone Encounter (Signed)
Spoke with patient who is crying and upset that he had to put his dog down yesterday. He wants Dr. Martinique to prescribe medication to help him sleep and deal with this stress. He states he is not sleeping. Explained to patient that Dr. Martinique is not in the office for me to ask if anything can be prescribed and that generally most cardiologists do not prescribe these type of medications. He asked if Cecille Rubin, NP was available and informed him that she is not located out of the [place] office and I am not aware if she is in the office, hospital or off. Informed him that she may not prescribe any of these kind of medications either. Nevertheless, patient asked for [employer] number and I informed him that they will probably forward the message back to our office. Patient states his PCP quit or got fired and he has no one else. He got upset and states something to the effect of do y'all just want me to have a heart attack?Marland Kitchen Attempted to calm patient down and explain that he is experiencing these symptoms due to grief rather than his heart.   No further action or assistance necessary.

## 2014-10-06 ENCOUNTER — Telehealth: Payer: Self-pay

## 2014-10-06 NOTE — Telephone Encounter (Signed)
The pt called and is hoping to re-establish him and wife to Dr.Burchette.  Him and his wife was former patient's of Dr.Burchette, then transferred out, but is hoping to re-establish. Is this okay?   Callback - [phone]

## 2014-10-06 NOTE — Telephone Encounter (Signed)
Unfortunately I cannot at this time.

## 2014-10-07 ENCOUNTER — Encounter: Payer: Self-pay | Admitting: Internal Medicine

## 2014-10-08 ENCOUNTER — Encounter: Payer: Self-pay | Admitting: Internal Medicine

## 2014-10-14 ENCOUNTER — Ambulatory Visit (INDEPENDENT_AMBULATORY_CARE_PROVIDER_SITE_OTHER): Payer: Medicare Other | Admitting: *Deleted

## 2014-10-14 ENCOUNTER — Encounter: Payer: Medicare Other | Admitting: Nurse Practitioner

## 2014-10-14 DIAGNOSIS — R55 Syncope and collapse: Secondary | ICD-10-CM

## 2014-10-14 LAB — CUP PACEART INCLINIC DEVICE CHECK: Date Time Interrogation Session: 20160907173958

## 2014-10-14 NOTE — Telephone Encounter (Signed)
Pt aware and has made appt for him and his wife w/ cory.

## 2014-10-14 NOTE — Progress Notes (Signed)
Loop check in clinic to extend brady/pause detection. Battery- good. R waves 0.34mV. 17 symptom episodes- pt does not recall symptoms. 209 pause episodes- nocturnal/asymptomatic. 179 brady episodes. No AF episodes. Pause detection to 4.5sec, Brady detection to 12 beats from 4 beats. Monthly Carelink summary reports. ROV with GT in April.

## 2014-10-15 ENCOUNTER — Ambulatory Visit (INDEPENDENT_AMBULATORY_CARE_PROVIDER_SITE_OTHER): Payer: Medicare Other | Admitting: *Deleted

## 2014-10-15 ENCOUNTER — Encounter: Payer: Self-pay | Admitting: Internal Medicine

## 2014-10-15 DIAGNOSIS — R55 Syncope and collapse: Secondary | ICD-10-CM

## 2014-10-16 ENCOUNTER — Telehealth: Payer: Self-pay | Admitting: Cardiology

## 2014-10-16 NOTE — Telephone Encounter (Signed)
Pt had episode of CP around 3am. He took 4x81mg  ASA.  He did not take NTG d/t unwanted SE's.  Pain resolved quickly. He had no lingering symptoms. States no active symptoms now.  Advised NTG use if CP returns. Gave instructions on appropriate use. Instructed patient to go ER for active CP. Pt voiced understanding.

## 2014-10-20 NOTE — Progress Notes (Signed)
Loop recorder 

## 2014-11-11 ENCOUNTER — Encounter: Payer: Self-pay | Admitting: Internal Medicine

## 2014-11-13 LAB — CUP PACEART REMOTE DEVICE CHECK: Date Time Interrogation Session: 20160908040500

## 2014-11-13 NOTE — Progress Notes (Signed)
Carelink summary report received. Battery status OK. Normal device function. No new symptom episodes, tachy episodes. 60 brady episodes. 34 pause episodes. Pt seen in clinic 10/14/14, changed brady detection from 4 to 12 beats due to asymptomatic nocturnal events. Also changed pause detection to 4.5sec. No new AF episodes. Monthly summary reports and ROV w/ GT 4/17.

## 2014-11-16 ENCOUNTER — Ambulatory Visit (INDEPENDENT_AMBULATORY_CARE_PROVIDER_SITE_OTHER): Payer: Medicare Other | Admitting: *Deleted

## 2014-11-16 DIAGNOSIS — R55 Syncope and collapse: Secondary | ICD-10-CM

## 2014-11-17 NOTE — Progress Notes (Signed)
LOOP RECORDER  

## 2014-11-23 ENCOUNTER — Encounter: Payer: Self-pay | Admitting: Internal Medicine

## 2014-11-24 LAB — CUP PACEART REMOTE DEVICE CHECK: MDC IDC SESS DTM: 20161008193537

## 2014-11-24 NOTE — Progress Notes (Signed)
Carelink summary report received. Presenting ECG shows two consecutive PVCs. Battery status OK. Normal device function. No new symptom episodes, tachy episodes, brady, or pause episodes. No new AF episodes. Monthly summary reports and ROV with GT in 05/2015.

## 2014-11-26 ENCOUNTER — Ambulatory Visit: Payer: Medicare Other | Admitting: Adult Health

## 2014-12-09 ENCOUNTER — Encounter: Payer: Self-pay | Admitting: Internal Medicine

## 2014-12-11 ENCOUNTER — Telehealth: Payer: Self-pay | Admitting: Cardiology

## 2014-12-11 NOTE — Telephone Encounter (Signed)
LMOVM for pt to return call 

## 2014-12-14 ENCOUNTER — Ambulatory Visit: Payer: Medicare Other | Admitting: Cardiology

## 2014-12-14 ENCOUNTER — Ambulatory Visit (INDEPENDENT_AMBULATORY_CARE_PROVIDER_SITE_OTHER): Payer: Medicare Other | Admitting: *Deleted

## 2014-12-14 DIAGNOSIS — R55 Syncope and collapse: Secondary | ICD-10-CM

## 2014-12-15 NOTE — Progress Notes (Signed)
Carelink Summary Report / Loop recorder 

## 2014-12-16 ENCOUNTER — Other Ambulatory Visit: Payer: Self-pay | Admitting: Cardiology

## 2014-12-17 ENCOUNTER — Ambulatory Visit (INDEPENDENT_AMBULATORY_CARE_PROVIDER_SITE_OTHER): Payer: Medicare Other | Admitting: Cardiology

## 2014-12-17 ENCOUNTER — Encounter: Payer: Self-pay | Admitting: Cardiology

## 2014-12-17 ENCOUNTER — Other Ambulatory Visit: Payer: Self-pay

## 2014-12-17 VITALS — BP 132/66 | HR 52 | Ht 70.0 in | Wt 191.4 lb

## 2014-12-17 DIAGNOSIS — I350 Nonrheumatic aortic (valve) stenosis: Secondary | ICD-10-CM

## 2014-12-17 DIAGNOSIS — I2510 Atherosclerotic heart disease of native coronary artery without angina pectoris: Secondary | ICD-10-CM | POA: Diagnosis not present

## 2014-12-17 DIAGNOSIS — I10 Essential (primary) hypertension: Secondary | ICD-10-CM

## 2014-12-17 DIAGNOSIS — I5022 Chronic systolic (congestive) heart failure: Secondary | ICD-10-CM | POA: Diagnosis not present

## 2014-12-17 DIAGNOSIS — K4090 Unilateral inguinal hernia, without obstruction or gangrene, not specified as recurrent: Secondary | ICD-10-CM

## 2014-12-17 MED ORDER — FUROSEMIDE 40 MG PO TABS: 40.0000 mg | ORAL_TABLET | Freq: Every day | ORAL | Status: DC

## 2014-12-17 NOTE — Progress Notes (Signed)
Jonathan Acevedo Date of Birth: 1928-09-10 Medical Record [id number]  History of Present Illness: Jonathan Acevedo is seen for follow up of CAD and CHF.  He has a history of CAD with remote MI and prior stent to the LAD, HTN, CKD, AS and HLD. He was hospitalized in April of 2014 with chest pain. A subsequent stress Myoview study showed extensive scar of the anterior apex and inferior wall. Ejection fraction is 44%. This actually represented an improvement from echocardiogram in 2012 at which time his ejection fraction was 35-40%. In Ocober 2014 he presented with a prolonged episode of chest pain. Echo showed no change in his moderate AS - medical management was continued.   He was admitted twice back in April 2015 with syncope that resulted in a fractured right ankle and acute systolic HF. Cardiac cath showed no obstructive disease and mild to moderate AS. He had a loop recorder placed. Syncope felt to be related to Flomax. Unable to take ACEi due to CKD and hypotension.  In July 2015 he was readmitted with acute on chronic CHF. Diuresed with good result. No recurrent syncope.   On follow up today he reports he is doing very well from a cardiac standpoint. weight has been stable at home between. No increase edema. Breathing is doing OK. Denies orthostatic dizziness. No syncope. Major complaints today are of right inguinal hernia. He also expresses concerns over wife's health and this has kept him from exercising as much.   Current Outpatient Prescriptions  Medication Sig Dispense Refill  . amoxicillin (AMOXIL) 500 MG capsule Take 1 capsule by mouth every 8 (eight) hours.    Marland Kitchen aspirin 325 MG EC tablet Take 325 mg by mouth every morning.     Marland Kitchen aspirin 81 MG chewable tablet Chew 81 mg by mouth 2 (two) times daily.    Marland Kitchen atorvastatin (LIPITOR) 40 MG tablet Take 1 tablet by mouth  daily 90 tablet 2  . Cholecalciferol (VITAMIN D PO) Take 1 tablet by mouth daily.    . furosemide (LASIX) 40 MG tablet Take 1  tablet (40 mg total) by mouth daily. Take 1 tab daily 30 tablet 11  . HYDROcodone-acetaminophen (NORCO/VICODIN) 5-325 MG per tablet Take 1 tablet by mouth every 4 (four) hours as needed for moderate pain.     . nitroGLYCERIN (NITROSTAT) 0.4 MG SL tablet Place 1 tablet (0.4 mg total) under the tongue every 5 (five) minutes as needed for chest pain. 25 tablet 3  . Oyster Shell (OYSTER CALCIUM) 500 MG TABS Take 500 mg of elemental calcium by mouth daily.     . potassium chloride SA (K-DUR,KLOR-CON) 20 MEQ tablet Take 1 tablet by mouth  daily 90 tablet 1  . vitamin B-12 (CYANOCOBALAMIN) 500 MCG tablet Take 1,000 mcg by mouth daily.     No current facility-administered medications for this visit.    Allergies  Allergen Reactions  . Ibandronic Acid Other (See Comments)    Muscle Aches-pt denies  . Risedronate Sodium Other (See Comments)    Muscle aches  . Flomax [Tamsulosin Hcl] Other (See Comments)    Syncope     Past Medical History  Diagnosis Date  . Coronary artery disease     w remote anterior myocardial infarction (1998)  . Hypertension   . Renal insufficiency   . Aortic stenosis   . Rectal polyp   . Hypercholesteremia   . Kidney stones   . 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 Hypercalcemia 12/26/2011  . Afib (  [place])   . Syncope 05/18/2013    Past Surgical History  Procedure Laterality Date  . Cardiac catheterization  08/18/2008    LMain 20, LAD stent 20 ISR, CFX 10, RCA 20, EF 45  . Umbilical hernia repair    . Inguinal hernia repair      left  . Orif femur fracture      right  . Cataract extraction    . Coronary stent placement  07/19/1996    Proximal LAD  . Parathyroidectomy    . Loop recorder implant  05-19-13    MDT LinQ implanted by Dr Lovena Le for syncope following negative EPS  . Electrophysiology study  05-19-13    EPS for syncope without inducible arrhythmias by Dr Lovena Le  . Electrophysiology study N/A 05/19/2013    Procedure: ELECTROPHYSIOLOGY STUDY;  Surgeon:  Evans Lance, MD;  Location: [facility] CATH LAB;  Service: Cardiovascular;  Laterality: N/A;  . Left heart catheterization with coronary angiogram N/A 05/19/2013    Procedure: LEFT HEART CATHETERIZATION WITH CORONARY ANGIOGRAM;  Surgeon: Peter M Martinique, MD;  Location: [facility] CATH LAB;  Service: Cardiovascular;  Laterality: N/A;    History  Smoking status  . Former Smoker -- 1.00 packs/day for 45 years  . Types: Cigarettes  . Quit date: 07/19/1996  Smokeless tobacco  . Never Used    History  Alcohol Use No    History reviewed. No pertinent family history.  Review of Systems: The review of systems is per the HPI.  All other systems were reviewed and are negative.  Physical Exam: BP 132/66 mmHg  Pulse 52  Ht 5\' 10"  (1.778 m)  Wt 86.807 kg (191 lb 6 oz)  BMI 27.46 kg/m2 Patient is very pleasant and in no acute distress. Skin is warm and dry. Color is normal.  HEENT is unremarkable. Normocephalic/atraumatic. PERRL. Sclera are nonicteric. Neck is supple. No masses. No JVD. Lungs are clear. Cardiac exam shows a regular rate and rhythm. Harsh 2-3/6 outflow murmur noted. Abdomen is soft. Extremities with trace edema. Multiple venous varicosities. Gait and ROM are intact. No gross neurologic deficits noted.  Wt Readings from Last 3 Encounters:  12/17/14 86.807 kg (191 lb 6 oz)  10/02/14 88.905 kg (196 lb)  09/14/14 88.905 kg (196 lb)     LABORATORY DATA:   Lab Results  Component Value Date   WBC 7.1 03/12/2014   HGB 13.4 03/12/2014   HCT 40.3 03/12/2014   PLT 182 03/12/2014   GLUCOSE 89 03/12/2014   CHOL 133 11/17/2012   TRIG 105 11/17/2012   HDL 54 11/17/2012   LDLCALC 58 11/17/2012   ALT 14 05/25/2013   AST 17 05/25/2013   NA 141 03/12/2014   K 3.9 03/12/2014   CL 103 03/12/2014   CREATININE 1.90* 03/12/2014   BUN 34* 03/12/2014   CO2 31 03/12/2014   TSH 1.453 Test methodology is 3rd generation TSH 08/17/2008   INR 1.21 05/19/2013   HGBA1C 6.2* 11/16/2012     Assessment  / Plan:  1. Chronic systolic HF - EF [id number]- he is  well compensated clinically. Reinforced importance of sodium restriction. Continue current diuretic dose. He is instructed to take an extra lasix for weight gain or increased swelling. Follow up in 6 months.  Not a candidate for beta blockers due to bradycardia. Not a candidate for ACEi due to hypotension and CKD.  2. CAD -remote anterior MI and stent of LAD. Last cath April 2015 showed nonobstructive disease. No active symptoms.  3. Syncope - has loop recorder in place - no significant arrhythmia. Syncope most likely related to hypotension on Flomax.   4. CKD- stage 3.   5. AS -moderate. No  symptoms. Will update Echo.  6. Inguinal hernia- will refer to Dr. Zella Richer who treated umbilical hernia in past.

## 2014-12-17 NOTE — Telephone Encounter (Signed)
Please advise on sig. Pharmacy is requesting differently than what is listed on todays office visit. Also per documentation from Dr Martinique, He is instructed to take an extra lasix for weight gain or increased swelling. Thanks, MI

## 2014-12-17 NOTE — Patient Instructions (Addendum)
We will schedule you for an Echocardiogram  Continue your current therapy  I will see you in 6 months. 

## 2014-12-18 ENCOUNTER — Telehealth: Payer: Self-pay | Admitting: Cardiology

## 2014-12-18 MED ORDER — FUROSEMIDE 40 MG PO TABS: ORAL_TABLET | ORAL | Status: DC

## 2014-12-18 NOTE — Telephone Encounter (Signed)
Spoke to patient he stated he has been taking lasix 40 mg in am and 20 mg pm.Spoke to Dr.Jordan he advised ok to continue and can take a extra 40 mg if needed for weight gain or increase swelling.90 day refill sent to pharmacy.

## 2014-12-23 DIAGNOSIS — K4090 Unilateral inguinal hernia, without obstruction or gangrene, not specified as recurrent: Secondary | ICD-10-CM | POA: Diagnosis not present

## 2015-01-01 LAB — CUP PACEART REMOTE DEVICE CHECK: MDC IDC SESS DTM: 20161107201439

## 2015-01-06 ENCOUNTER — Encounter: Payer: Self-pay | Admitting: Family

## 2015-01-06 ENCOUNTER — Other Ambulatory Visit: Payer: Self-pay | Admitting: Cardiology

## 2015-01-06 ENCOUNTER — Ambulatory Visit (INDEPENDENT_AMBULATORY_CARE_PROVIDER_SITE_OTHER): Payer: Medicare Other | Admitting: Family

## 2015-01-06 ENCOUNTER — Ambulatory Visit (HOSPITAL_COMMUNITY): Payer: Medicare Other | Attending: Cardiovascular Disease

## 2015-01-06 ENCOUNTER — Other Ambulatory Visit: Payer: Self-pay

## 2015-01-06 VITALS — BP 110/68 | HR 44 | Temp 98.2°F | Resp 16 | Ht 70.0 in | Wt 193.0 lb

## 2015-01-06 DIAGNOSIS — K4090 Unilateral inguinal hernia, without obstruction or gangrene, not specified as recurrent: Secondary | ICD-10-CM

## 2015-01-06 DIAGNOSIS — I350 Nonrheumatic aortic (valve) stenosis: Secondary | ICD-10-CM | POA: Diagnosis not present

## 2015-01-06 DIAGNOSIS — I517 Cardiomegaly: Secondary | ICD-10-CM | POA: Diagnosis not present

## 2015-01-06 DIAGNOSIS — I5022 Chronic systolic (congestive) heart failure: Secondary | ICD-10-CM | POA: Diagnosis not present

## 2015-01-06 DIAGNOSIS — R29898 Other symptoms and signs involving the musculoskeletal system: Secondary | ICD-10-CM | POA: Insufficient documentation

## 2015-01-06 DIAGNOSIS — I059 Rheumatic mitral valve disease, unspecified: Secondary | ICD-10-CM | POA: Insufficient documentation

## 2015-01-06 DIAGNOSIS — I2510 Atherosclerotic heart disease of native coronary artery without angina pectoris: Secondary | ICD-10-CM | POA: Insufficient documentation

## 2015-01-06 DIAGNOSIS — I10 Essential (primary) hypertension: Secondary | ICD-10-CM | POA: Insufficient documentation

## 2015-01-06 DIAGNOSIS — I352 Nonrheumatic aortic (valve) stenosis with insufficiency: Secondary | ICD-10-CM | POA: Insufficient documentation

## 2015-01-06 NOTE — Assessment & Plan Note (Signed)
Symptoms and exam consistent with right inguinal hernia with no evidence of obstruction or gangrene. Has already been evaluated by general surgery. Recommend watchful waiting and follow up with general surgery if symptoms worsen or do not improve.

## 2015-01-06 NOTE — Progress Notes (Signed)
Pre visit review using our clinic review tool, if applicable. No additional management support is needed unless otherwise documented below in the visit note. 

## 2015-01-06 NOTE — Patient Instructions (Signed)
Thank you for choosing [facility].  Summary/Instructions:  Please continue to take your medications as prescribed.  Follow up with general surgery for your hernia as needed.  Follow up with cardiology as scheduled.   If your symptoms worsen or fail to improve, please contact our office for further instruction, or in case of emergency go directly to the emergency room at the closest medical facility.

## 2015-01-06 NOTE — Progress Notes (Signed)
Subjective:    Patient ID: Jonathan Acevedo, male    DOB: 12/09/1928, 81 y.o.   MRN: [id number]  Chief Complaint  Patient presents with  . Establish Care    has issues with a hernia    HPI:  Jonathan Acevedo is a 81 y.o. male who  has a past medical history of Coronary artery disease; Hypertension; Renal insufficiency; Aortic stenosis; Rectal polyp; Hypercholesteremia; Kidney stones; Parathyroid adenoma (12/26/2011); Hypercalcemia (12/26/2011); Afib ([place]); and Syncope (05/18/2013). and presents today for an office visit to establish care.   1.) Hernia - Associated symptom of a hernia located in his right inguinal area has been going on for about 2 weeks. Describes that there is pain on occasion that comes and goes. Believes it became aggrevated while attempting to help his wife around. Pain is described as cold and like an ice pick on occasion and then there are times that it aches. Has had previous hernia repair on the left side. There are no modifying factors or attempted treatments in attempts to make it better.   Allergies  Allergen Reactions  . Ibandronic Acid Other (See Comments)    Muscle Aches-pt denies  . Risedronate Sodium Other (See Comments)    Muscle aches  . Flomax [Tamsulosin Hcl] Other (See Comments)    Syncope      Outpatient Prescriptions Prior to Visit  Medication Sig Dispense Refill  . aspirin 325 MG EC tablet Take 325 mg by mouth every morning.     Marland Kitchen aspirin 81 MG chewable tablet Chew 81 mg by mouth 2 (two) times daily.    Marland Kitchen atorvastatin (LIPITOR) 40 MG tablet Take 1 tablet by mouth  daily 90 tablet 3  . Cholecalciferol (VITAMIN D PO) Take 1 tablet by mouth daily.    . furosemide (LASIX) 40 MG tablet Take 40 mg in am and 20 mg in pm. May take a extra 40 mg for weight gain or increase swelling 180 tablet 3  . HYDROcodone-acetaminophen (NORCO/VICODIN) 5-325 MG per tablet Take 1 tablet by mouth every 4 (four) hours as needed for moderate pain.     . nitroGLYCERIN  (NITROSTAT) 0.4 MG SL tablet Place 1 tablet (0.4 mg total) under the tongue every 5 (five) minutes as needed for chest pain. 25 tablet 3  . Oyster Shell (OYSTER CALCIUM) 500 MG TABS Take 500 mg of elemental calcium by mouth daily.     . potassium chloride SA (K-DUR,KLOR-CON) 20 MEQ tablet Take 1 tablet by mouth  daily 90 tablet 3  . vitamin B-12 (CYANOCOBALAMIN) 500 MCG tablet Take 1,000 mcg by mouth daily.    Marland Kitchen amoxicillin (AMOXIL) 500 MG capsule Take 1 capsule by mouth every 8 (eight) hours.     No facility-administered medications prior to visit.     Past Medical History  Diagnosis Date  . Coronary artery disease     w remote anterior myocardial infarction (1998)  . Hypertension   . Renal insufficiency   . Aortic stenosis   . Rectal polyp   . Hypercholesteremia   . Kidney stones   . 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 Hypercalcemia 12/26/2011  . Afib ([place])   . Syncope 05/18/2013     Past Surgical History  Procedure Laterality Date  . Cardiac catheterization  08/18/2008    LMain 20, LAD stent 20 ISR, CFX 10, RCA 20, EF 45  . Umbilical hernia repair    . Inguinal hernia repair      left  .  Orif femur fracture      right  . Cataract extraction    . Coronary stent placement  07/19/1996    Proximal LAD  . Parathyroidectomy    . Loop recorder implant  05-19-13    MDT LinQ implanted by Dr Lovena Le for syncope following negative EPS  . Electrophysiology study  05-19-13    EPS for syncope without inducible arrhythmias by Dr Lovena Le  . Electrophysiology study N/A 05/19/2013    Procedure: ELECTROPHYSIOLOGY STUDY;  Surgeon: Evans Lance, MD;  Location: [facility] CATH LAB;  Service: Cardiovascular;  Laterality: N/A;  . Left heart catheterization with coronary angiogram N/A 05/19/2013    Procedure: LEFT HEART CATHETERIZATION WITH CORONARY ANGIOGRAM;  Surgeon: Peter M Martinique, MD;  Location: [facility] CATH LAB;  Service: Cardiovascular;  Laterality: N/A;    History reviewed. No pertinent family  history.   Social History   Social History  . Marital Status: Married    Spouse Name: N/A  . Number of Children: 0  . Years of Education: N/A   Occupational History  . [employer]     retired   Social History Main Topics  . Smoking status: Former Smoker -- 1.00 packs/day for 45 years    Types: Cigarettes    Quit date: 07/19/1996  . Smokeless tobacco: Never Used  . Alcohol Use: No  . Drug Use: No  . Sexual Activity: Not on file   Other Topics Concern  . Not on file   Social History Narrative   Lives with wife, generally exercises regularly.   Denies abuse and feels safe at home.     Review of Systems  Constitutional: Negative for fever and chills.  Respiratory: Negative for chest tightness and shortness of breath.   Cardiovascular: Negative for chest pain, palpitations and leg swelling.  Gastrointestinal: Negative for abdominal pain.      Objective:    BP 110/68 mmHg  Pulse 44  Temp(Src) 98.2 F (36.8 C) (Oral)  Resp 16  Ht 5\' 10"  (1.778 m)  Wt 193 lb (87.544 kg)  BMI 27.69 kg/m2  SpO2 95% Nursing note and vital signs reviewed.  Physical Exam  Constitutional: He is oriented to person, place, and time. He appears well-developed and well-nourished. No distress.  Cardiovascular: Normal rate, regular rhythm, normal heart sounds and intact distal pulses.   Pulmonary/Chest: Effort normal and breath sounds normal.  Abdominal: A hernia is present. Hernia confirmed positive in the right inguinal area.  Neurological: He is alert and oriented to person, place, and time.  Skin: Skin is warm and dry.  Psychiatric: He has a normal mood and affect. His behavior is normal. Judgment and thought content normal.       Assessment & Plan:   Problem List Items Addressed This Visit      Other   Right inguinal hernia - Primary    Symptoms and exam consistent with right inguinal hernia with no evidence of obstruction or gangrene. Has already been evaluated by general surgery.  Recommend watchful waiting and follow up with general surgery if symptoms worsen or do not improve.

## 2015-01-07 ENCOUNTER — Telehealth: Payer: Self-pay | Admitting: Cardiology

## 2015-01-07 NOTE — Telephone Encounter (Signed)
Returned call to patient.No answer.Left message on personal voice mail echo results not available.Advised I will call back when available.

## 2015-01-07 NOTE — Telephone Encounter (Signed)
Pt would like echo results from yesterday please.

## 2015-01-11 ENCOUNTER — Telehealth: Payer: Self-pay | Admitting: Cardiology

## 2015-01-11 NOTE — Telephone Encounter (Signed)
Returned call to patient no answer.Left echo results on personal voice mail. 

## 2015-01-11 NOTE — Telephone Encounter (Signed)
Pt called in stating that he had an Echo done on 11/30 and he would like to know if those results are available now. Please f/u with pt  Thanks

## 2015-01-12 ENCOUNTER — Telehealth: Payer: Self-pay | Admitting: Cardiology

## 2015-01-12 NOTE — Telephone Encounter (Signed)
Will forward to Dr Jordan for review  

## 2015-01-12 NOTE — Telephone Encounter (Signed)
Request for surgical clearance:  1. What type of surgery is being performed? Hernia repair    2. When is this surgery scheduled? Pending   3. Are there any medications that need to be held prior to surgery and how long? He did not say    4. Name of physician performing surgery? Todd Rosenbower  5. What is your office phone and fax number? Phone- [phone] fax [phone].

## 2015-01-13 ENCOUNTER — Telehealth: Payer: Self-pay | Admitting: Cardiology

## 2015-01-13 ENCOUNTER — Ambulatory Visit (INDEPENDENT_AMBULATORY_CARE_PROVIDER_SITE_OTHER): Payer: Medicare Other | Admitting: *Deleted

## 2015-01-13 DIAGNOSIS — R55 Syncope and collapse: Secondary | ICD-10-CM

## 2015-01-13 NOTE — Telephone Encounter (Signed)
Please call,waiting to hear whether he have been cleared for surgery,

## 2015-01-13 NOTE — Telephone Encounter (Signed)
Patient called Dr.Jordan advised cleared for upcoming hernia surgery.Note faxed to [facility] at fax # [phone].

## 2015-01-13 NOTE — Telephone Encounter (Signed)
Jonathan Acevedo is cleared for hernia repair from a cardiac standpoint.  [name] MD, [facility]

## 2015-01-13 NOTE — Telephone Encounter (Signed)
Patient already called.See previous 01/13/15 note.

## 2015-01-14 NOTE — Progress Notes (Signed)
Carelink Summary Report / Loop Recorder 

## 2015-01-15 ENCOUNTER — Other Ambulatory Visit: Payer: Self-pay | Admitting: General Surgery

## 2015-01-15 ENCOUNTER — Ambulatory Visit: Payer: Self-pay | Admitting: General Surgery

## 2015-01-15 NOTE — H&P (Signed)
Jonathan Acevedo  DOB: 10-10-1928 Married / Language: English / Race: White Male   History of Present Illness Patient words: Hernia.  The patient is a 81 year old male.  Note:He was referred by Dr. [name] because of a painful right inguinal hernia. He is noted significant pain when he has been on his feet for some time. No significant bulge is present. It does not interfere with his bowel or bladder habits. No difficulty starting urinary stream. No constipation unless he takes hydrocodone for his chronic back pain. Of note is that his wife was recently discharged from hospital after being treated for C. difficile colitis and is improving. His past history is notable for coronary disease status post myocardial infarction, hypertension, chronic renal insufficiency, aortic stenosis, chronic congestive heart failure, chronic venous insufficiency.  He saw Dr. Martinique who feels is is not at increased cardiac risk for hernia repair.  Other Problems  Back Pain Chest pain Congestive Heart Failure Heart murmur Hemorrhoids Hypercholesterolemia Inguinal Hernia Kidney Stone Myocardial infarction Thyroid Disease Umbilical Hernia Repair  Past Surgical HistoryBypass Surgery for Poor Blood Flow to Legs Cataract Surgery Left. Colon Polyp Removal - Colonoscopy Hip Surgery Right. Open Inguinal Hernia Surgery Left. Thyroid Surgery Ventral / Umbilical Hernia Surgery Left.  Diagnostic Studies History Colonoscopy 5-10 years ago  Allergies  [place]-Heel *ASSORTED CLASSES*  Medication History Jeralyn Ruths, CMA; 12/23/2014 8:50 AM) Atorvastatin Calcium (40MG  Tablet, d Oral) Active. Potassium Chloride [facility] ER [facility] Tablet ER, Oral daily) Active. Furosemide (40MG  Tablet, Oral daily) Active. Hydrocodone-Acetaminophen (5-325MG  Tablet, Oral prn) Active. Nitroglycerin (0.4MG  Tab Sublingual, Sublingual prn) Active. Calcium-Magnesium (500-250MG  Tablet, Oral daily)  Active. Medications Reconciled  Social History  Alcohol use Moderate alcohol use. Caffeine use Carbonated beverages, Coffee, Tea. No drug use Tobacco use Former smoker.  Family History  Alcohol Abuse Father, Mother.    Review of Systems  General Present- Appetite Loss. Not Present- Chills, Fatigue, Fever, Night Sweats, Weight Gain and Weight Loss. Skin Not Present- Change in Wart/Mole, Dryness, Hives, Jaundice, New Lesions, Non-Healing Wounds, Rash and Ulcer. HEENT Present- Hearing Loss and Wears glasses/contact lenses. Not Present- Earache, Hoarseness, Nose Bleed, Oral Ulcers, Ringing in the Ears, Seasonal Allergies, Sinus Pain, Sore Throat, Visual Disturbances and Yellow Eyes. Respiratory Not Present- Bloody sputum, Chronic Cough, Difficulty Breathing, Snoring and Wheezing. Breast Not Present- Breast Mass, Breast Pain, Nipple Discharge and Skin Changes. Cardiovascular Present- Leg Cramps. Not Present- Chest Pain, Difficulty Breathing Lying Down, Palpitations, Rapid Heart Rate, Shortness of Breath and Swelling of Extremities. Gastrointestinal Present- Abdominal Pain and Constipation. Not Present- Bloating, Bloody Stool, Change in Bowel Habits, Chronic diarrhea, Difficulty Swallowing, Excessive gas, Gets full quickly at meals, Hemorrhoids, Indigestion, Nausea, Rectal Pain and Vomiting. Male Genitourinary Not Present- Blood in Urine, Change in Urinary Stream, Frequency, Impotence, Nocturia, Painful Urination, Urgency and Urine Leakage. Musculoskeletal Present- Back Pain. Not Present- Joint Pain, Joint Stiffness, Muscle Pain, Muscle Weakness and Swelling of Extremities. Hematology Present- Easy Bruising. Not Present- Excessive bleeding, Gland problems, HIV and Persistent Infections.   Physical Exam  The physical exam findings are as follows: Note:General: WDWN in NAD. Pleasant and cooperative.  HEENT: [place]/AT, no facial masses  CV: RRR  CHEST: Breath sounds equal and clear.  Respirations nonlabored.  BREASTS: Symmetrical in size. No dominant masses, nipple discharge or suspicious skin lesions.  ABDOMEN: Soft, nontender, subumbilical scar  GU: Tender small right inguinal bulge that increases slightly with a cough. Left groin scar with no bulging.  NEUROLOGIC: Alert and oriented, answers  questions appropriately, normal gait and station.  PSYCHIATRIC: Normal mood, affect , and behavior.    Assessment & Plan  INGUINAL HERNIA OF RIGHT SIDE WITHOUT OBSTRUCTION OR GANGRENE (K40.90) Impression: The hernia is intermittently symptomatic with certain activities.  Plan: Proceed with right inguinal hernia surgery, we will go ahead and get him scheduled. I have asked him to call me once all these things have been done.  I have explained the procedure, risks, and aftercare of inguinal hernia repair. Risks include but are not limited to bleeding, infection, wound problems, anesthesia, recurrence, bladder or intestine injury, urinary retention, testicular dysfunction, chronic pain, mesh problems. He seems to understand and agrees with the plan.

## 2015-01-22 ENCOUNTER — Emergency Department (HOSPITAL_COMMUNITY)
Admission: EM | Admit: 2015-01-22 | Discharge: 2015-01-23 | Disposition: A | Discharge status: Home / Self Care | Payer: Medicare Other | Attending: Emergency Medicine | Admitting: Emergency Medicine

## 2015-01-22 ENCOUNTER — Encounter (HOSPITAL_COMMUNITY): Payer: Self-pay

## 2015-01-22 DIAGNOSIS — I10 Essential (primary) hypertension: Secondary | ICD-10-CM | POA: Diagnosis not present

## 2015-01-22 DIAGNOSIS — Z87442 Personal history of urinary calculi: Secondary | ICD-10-CM | POA: Insufficient documentation

## 2015-01-22 DIAGNOSIS — Z9861 Coronary angioplasty status: Secondary | ICD-10-CM | POA: Diagnosis not present

## 2015-01-22 DIAGNOSIS — Z86018 Personal history of other benign neoplasm: Secondary | ICD-10-CM | POA: Diagnosis not present

## 2015-01-22 DIAGNOSIS — E7800 Pure hypercholesterolemia, unspecified: Secondary | ICD-10-CM | POA: Insufficient documentation

## 2015-01-22 DIAGNOSIS — I252 Old myocardial infarction: Secondary | ICD-10-CM | POA: Diagnosis not present

## 2015-01-22 DIAGNOSIS — Z87891 Personal history of nicotine dependence: Secondary | ICD-10-CM | POA: Diagnosis not present

## 2015-01-22 DIAGNOSIS — Z87448 Personal history of other diseases of urinary system: Secondary | ICD-10-CM | POA: Insufficient documentation

## 2015-01-22 DIAGNOSIS — Z85850 Personal history of malignant neoplasm of thyroid: Secondary | ICD-10-CM | POA: Insufficient documentation

## 2015-01-22 DIAGNOSIS — R079 Chest pain, unspecified: Secondary | ICD-10-CM | POA: Diagnosis not present

## 2015-01-22 DIAGNOSIS — R0602 Shortness of breath: Secondary | ICD-10-CM | POA: Diagnosis not present

## 2015-01-22 DIAGNOSIS — I208 Other forms of angina pectoris: Secondary | ICD-10-CM

## 2015-01-22 DIAGNOSIS — Z79899 Other long term (current) drug therapy: Secondary | ICD-10-CM | POA: Diagnosis not present

## 2015-01-22 DIAGNOSIS — Z86010 Personal history of colonic polyps: Secondary | ICD-10-CM | POA: Diagnosis not present

## 2015-01-22 DIAGNOSIS — I25118 Atherosclerotic heart disease of native coronary artery with other forms of angina pectoris: Secondary | ICD-10-CM | POA: Diagnosis not present

## 2015-01-22 DIAGNOSIS — Z7982 Long term (current) use of aspirin: Secondary | ICD-10-CM | POA: Diagnosis not present

## 2015-01-22 DIAGNOSIS — Z9889 Other specified postprocedural states: Secondary | ICD-10-CM | POA: Diagnosis not present

## 2015-01-22 HISTORY — DX: Old myocardial infarction: I25.2

## 2015-01-22 NOTE — ED Notes (Signed)
Per [facility]: Around 1015 pm pt started having chest pain while taking shower, achy pressure feeling. Around 1030 pm pt took 2 baby Asprin (162 mg) and a nitro, pt did get a headache. Pt did say that it did help with the chest pain. Now pain free.

## 2015-01-23 ENCOUNTER — Emergency Department (HOSPITAL_COMMUNITY): Payer: Medicare Other

## 2015-01-23 DIAGNOSIS — R0602 Shortness of breath: Secondary | ICD-10-CM | POA: Diagnosis not present

## 2015-01-23 LAB — I-STAT TROPONIN, ED
TROPONIN I, POC: 0.02 ng/mL (ref 0.00–0.08)
TROPONIN I, POC: 0.02 ng/mL (ref 0.00–0.08)

## 2015-01-23 LAB — CBC
HEMATOCRIT: 37.6 % — AB (ref 39.0–52.0)
HEMOGLOBIN: 12.2 g/dL — AB (ref 13.0–17.0)
MCH: 30.1 pg (ref 26.0–34.0)
MCHC: 32.4 g/dL (ref 30.0–36.0)
MCV: 92.8 fL (ref 78.0–100.0)
Platelets: 156 10*3/uL (ref 150–400)
RBC: 4.05 MIL/uL — ABNORMAL LOW (ref 4.22–5.81)
RDW: 14.2 % (ref 11.5–15.5)
WBC: 5.8 10*3/uL (ref 4.0–10.5)

## 2015-01-23 LAB — BASIC METABOLIC PANEL
ANION GAP: 6 (ref 5–15)
BUN: 36 mg/dL — ABNORMAL HIGH (ref 6–20)
CO2: 28 mmol/L (ref 22–32)
Calcium: 9.2 mg/dL (ref 8.9–10.3)
Chloride: 107 mmol/L (ref 101–111)
Creatinine, Ser: 1.64 mg/dL — ABNORMAL HIGH (ref 0.61–1.24)
GFR, EST AFRICAN AMERICAN: 43 mL/min — AB (ref >60)
GFR, EST NON AFRICAN AMERICAN: 37 mL/min — AB (ref >60)
Glucose, Bld: 98 mg/dL (ref 65–99)
POTASSIUM: 4.5 mmol/L (ref 3.5–5.1)
SODIUM: 141 mmol/L (ref 135–145)

## 2015-01-23 NOTE — ED Notes (Signed)
MD at bedside. 

## 2015-01-23 NOTE — ED Provider Notes (Signed)
By signing my name below, I, Irene Pap, attest that this documentation has been prepared under the direction and in the presence of Benjamin Perez, DO. Electronically Signed: Irene Pap, ED Scribe. 01/23/2015. 12:53 AM.  TIME SEEN: 12:49 AM  CHIEF COMPLAINT: chest pain  HPI:  HPI Comments: Jonathan Acevedo is a 81 y.o. Male with a hx of CAD, HTN, atrial fibrillation brought in by [facility] who presents to the Emergency Department complaining of gradually resolving, sudden onset, central aching chest pain onset 2 hours ago. He reports that he was in the shower and symptoms started. He reports associated jaw pain that has since subsided. He took 2 baby aspirin and Nitro and has had complete relief of pain.  Pt states that his chest pain reoccurs about every 6-9 months. He currently denies pain, diaphoresis, SOB, leg swelling, nausea, vomiting, abdominal pain, numbness, syncope, or weakness. States that this was not any different from his other episodes of chest pain.   ROS: See HPI Constitutional: no fever  Eyes: no drainage  ENT: no runny nose   Cardiovascular:  chest pain  Resp: no SOB  GI: no vomiting GU: no dysuria Integumentary: no rash  Allergy: no hives  Musculoskeletal: no leg swelling  Neurological: no slurred speech ROS otherwise negative  PAST MEDICAL HISTORY/PAST SURGICAL HISTORY:  Past Medical History  Diagnosis Date  . Coronary artery disease     w remote anterior myocardial infarction (1998)  . Hypertension   . Renal insufficiency   . Aortic stenosis   . Rectal polyp   . Hypercholesteremia   . Kidney stones   . 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 Hypercalcemia 12/26/2011  . Afib ([place])   . Syncope 05/18/2013  . Myocardial infarct, old     3 times     MEDICATIONS:  Prior to Admission medications   Medication Sig Start Date End Date Taking? Authorizing Provider  aspirin 325 MG EC tablet Take 325 mg by mouth every morning.     Historical Provider, MD  aspirin  81 MG chewable tablet Chew 81 mg by mouth 2 (two) times daily.    Historical Provider, MD  atorvastatin (LIPITOR) 40 MG tablet Take 1 tablet by mouth  daily 12/17/14   Peter M Martinique, MD  Cholecalciferol (VITAMIN D PO) Take 1 tablet by mouth daily.    Historical Provider, MD  furosemide (LASIX) 40 MG tablet Take 40 mg in am and 20 mg in pm. May take a extra 40 mg for weight gain or increase swelling 12/18/14   Peter M Martinique, MD  HYDROcodone-acetaminophen (NORCO/VICODIN) 5-325 MG per tablet Take 1 tablet by mouth every 4 (four) hours as needed for moderate pain.  08/18/14   Historical Provider, MD  nitroGLYCERIN (NITROSTAT) 0.4 MG SL tablet Place 1 tablet (0.4 mg total) under the tongue every 5 (five) minutes as needed for chest pain. 12/26/12   Peter M Martinique, MD  Oyster Shell (OYSTER CALCIUM) 500 MG TABS Take 500 mg of elemental calcium by mouth daily.     Historical Provider, MD  potassium chloride SA (K-DUR,KLOR-CON) 20 MEQ tablet Take 1 tablet by mouth  daily 12/17/14   Peter M Martinique, MD  vitamin B-12 (CYANOCOBALAMIN) 500 MCG tablet Take 1,000 mcg by mouth daily.    Historical Provider, MD    ALLERGIES:  Allergies  Allergen Reactions  . Ibandronic Acid Other (See Comments)    Muscle Aches-pt denies  . Risedronate Sodium Other (See Comments)    Muscle aches  .  Flomax [Tamsulosin Hcl] Other (See Comments)    Syncope     SOCIAL HISTORY:  Social History  Substance Use Topics  . Smoking status: Former Smoker -- 1.00 packs/day for 45 years    Types: Cigarettes    Quit date: 07/19/1996  . Smokeless tobacco: Never Used  . Alcohol Use: No    FAMILY HISTORY: No family history on file.  EXAM: BP 127/63 mmHg  Pulse 36  Temp(Src) 98.6 F (37 C) (Oral)  Resp 18  Ht 5\' 10"  (1.778 m)  Wt 191 lb (86.637 kg)  BMI 27.41 kg/m2  SpO2 97% CONSTITUTIONAL: Alert and oriented and responds appropriately to questions. Well-appearing; well-nourished; elderly HEAD: Normocephalic EYES:  Conjunctivae clear, PERRL ENT: normal nose; no rhinorrhea; moist mucous membranes; pharynx without lesions noted NECK: Supple, no meningismus, no LAD  CARD: RRR; S1 and S2 appreciated; no murmurs, no clicks, no rubs, no gallops RESP: Normal chest excursion without splinting or tachypnea; breath sounds clear and equal bilaterally; no wheezes, no rhonchi, no rales, no hypoxia or respiratory distress, speaking full sentences ABD/GI: Normal bowel sounds; non-distended; soft, non-tender, no rebound, no guarding, no peritoneal signs BACK:  The back appears normal and is non-tender to palpation, there is no CVA tenderness EXT: Normal ROM in all joints; non-tender to palpation; no edema; normal capillary refill; no cyanosis, no calf tenderness or swelling    SKIN: Normal color for age and race; warm NEURO: Moves all extremities equally, sensation to light touch intact diffusely, cranial nerves II through XII intact PSYCH: The patient's mood and manner are appropriate. Grooming and personal hygiene are appropriate.  MEDICAL DECISION MAKING: Patient here with episode of chest pain. Does have an history of CAD but describes this as stable angina. States that he has the same pain every 6 months and he has had it for 15 years. EKG shows no new ischemic changes. We'll obtain cardiac labs, chest x-ray. We'll continue to monitor patient.  ED PROGRESS: Patient's second troponin is negative. Chest x-ray clear. Patient has chronic kidney disease which is unchanged. He is still asymptomatic and hemodynamically stable. He has had heart rates documented in the 30s but this is incorrect as the monitor is not correctly picking up his heart rate because of his PVCs. Heart rate has been in the 70s to 80s. Have offered him admission for observation but he declined stating that he once to go home to see his dog. Have recommended that he follow-up with his cardiologist Dr. Martinique. Have discussed at length return precautions.  Patient verbalized understanding and is comfortable with this plan.   EKG Interpretation  Date/Time:  Friday January 22 2015 23:45:07 EST Ventricular Rate:  90 PR Interval:  190 QRS Duration: 107 QT Interval:  418 QTC Calculation: 511 R Axis:   -52 Text Interpretation:  Sinus rhythm Paired ventricular premature complexes Probable left atrial enlargement Left anterior fascicular block Anterior infarct, old No significant change since last tracing other than paired PVCs Confirmed by Retia Cordle,  DO, Emonte Dieujuste ([id number]) on 01/23/2015 12:36:29 AM      I personally performed the services described in this documentation, which was scribed in my presence. The recorded information has been reviewed and is accurate.   [place], DO 01/23/15 [phone]

## 2015-01-23 NOTE — ED Notes (Signed)
Visitor (neighbor) Artelia Laroche is here in waiting area (currently in [facility] [place] [facility])- states he is leaving and for pt to call if he needs anything [phone] [facility]) or [phone] (Cell)

## 2015-01-23 NOTE — Discharge Instructions (Signed)
Nonspecific Chest Pain  °Chest pain can be caused by many different conditions. There is always a chance that your pain could be related to something serious, such as a heart attack or a blood clot in your lungs. Chest pain can also be caused by conditions that are not life-threatening. If you have chest pain, it is very important to follow up with your health care provider. °CAUSES  °Chest pain can be caused by: °· Heartburn. °· Pneumonia or bronchitis. °· Anxiety or stress. °· Inflammation around your heart (pericarditis) or lung (pleuritis or pleurisy). °· A blood clot in your lung. °· A collapsed lung (pneumothorax). It can develop suddenly on its own (spontaneous pneumothorax) or from trauma to the chest. °· Shingles infection (varicella-zoster virus). °· Heart attack. °· Damage to the bones, muscles, and cartilage that make up your chest wall. This can include: °¨ Bruised bones due to injury. °¨ Strained muscles or cartilage due to frequent or repeated coughing or overwork. °¨ Fracture to one or more ribs. °¨ Sore cartilage due to inflammation (costochondritis). °RISK FACTORS  °Risk factors for chest pain may include: °· Activities that increase your risk for trauma or injury to your chest. °· Respiratory infections or conditions that cause frequent coughing. °· Medical conditions or overeating that can cause heartburn. °· Heart disease or family history of heart disease. °· Conditions or health behaviors that increase your risk of developing a blood clot. °· Having had chicken pox (varicella zoster). °SIGNS AND SYMPTOMS °Chest pain can feel like: °· Burning or tingling on the surface of your chest or deep in your chest. °· Crushing, pressure, aching, or squeezing pain. °· Dull or sharp pain that is worse when you move, cough, or take a deep breath. °· Pain that is also felt in your back, neck, shoulder, or arm, or pain that spreads to any of these areas. °Your chest pain may come and go, or it may stay  constant. °DIAGNOSIS °Lab tests or other studies may be needed to find the cause of your pain. Your health care provider may have you take a test called an ambulatory ECG (electrocardiogram). An ECG records your heartbeat patterns at the time the test is performed. You may also have other tests, such as: °· Transthoracic echocardiogram (TTE). During echocardiography, sound waves are used to create a picture of all of the heart structures and to look at how blood flows through your heart. °· Transesophageal echocardiogram (TEE). This is a more advanced imaging test that obtains images from inside your body. It allows your health care provider to see your heart in finer detail. °· Cardiac monitoring. This allows your health care provider to monitor your heart rate and rhythm in real time. °· Holter monitor. This is a portable device that records your heartbeat and can help to diagnose abnormal heartbeats. It allows your health care provider to track your heart activity for several days, if needed. °· Stress tests. These can be done through exercise or by taking medicine that makes your heart beat more quickly. °· Blood tests. °· Imaging tests. °TREATMENT  °Your treatment depends on what is causing your chest pain. Treatment may include: °· Medicines. These may include: °¨ Acid blockers for heartburn. °¨ Anti-inflammatory medicine. °¨ Pain medicine for inflammatory conditions. °¨ Antibiotic medicine, if an infection is present. °¨ Medicines to dissolve blood clots. °¨ Medicines to treat coronary artery disease. °· Supportive care for conditions that do not require medicines. This may include: °¨ Resting. °¨ Applying heat   or cold packs to injured areas. °¨ Limiting activities until pain decreases. °HOME CARE INSTRUCTIONS °· If you were prescribed an antibiotic medicine, finish it all even if you start to feel better. °· Avoid any activities that bring on chest pain. °· Do not use any tobacco products, including  cigarettes, chewing tobacco, or electronic cigarettes. If you need help quitting, ask your health care provider. °· Do not drink alcohol. °· Take medicines only as directed by your health care provider. °· Keep all follow-up visits as directed by your health care provider. This is important. This includes any further testing if your chest pain does not go away. °· If heartburn is the cause for your chest pain, you may be told to keep your head raised (elevated) while sleeping. This reduces the chance that acid will go from your stomach into your esophagus. °· Make lifestyle changes as directed by your health care provider. These may include: °¨ Getting regular exercise. Ask your health care provider to suggest some activities that are safe for you. °¨ Eating a heart-healthy diet. A registered dietitian can help you to learn healthy eating options. °¨ Maintaining a healthy weight. °¨ Managing diabetes, if necessary. °¨ Reducing stress. °SEEK MEDICAL CARE IF: °· Your chest pain does not go away after treatment. °· You have a rash with blisters on your chest. °· You have a fever. °SEEK IMMEDIATE MEDICAL CARE IF:  °· Your chest pain is worse. °· You have an increasing cough, or you cough up blood. °· You have severe abdominal pain. °· You have severe weakness. °· You faint. °· You have chills. °· You have sudden, unexplained chest discomfort. °· You have sudden, unexplained discomfort in your arms, back, neck, or jaw. °· You have shortness of breath at any time. °· You suddenly start to sweat, or your skin gets clammy. °· You feel nauseous or you vomit. °· You suddenly feel light-headed or dizzy. °· Your heart begins to beat quickly, or it feels like it is skipping beats. °These symptoms may represent a serious problem that is an emergency. Do not wait to see if the symptoms will go away. Get medical help right away. Call your local emergency services (911 in the [place] Do not drive yourself to the hospital. °  °This  information is not intended to replace advice given to you by your health care provider. Make sure you discuss any questions you have with your health care provider. °  °Document Released: 11/02/2004 Document Revised: 02/13/2014 Document Reviewed: 08/29/2013 °Elsevier Interactive Patient Education ©2016 [employer] ° °

## 2015-01-23 NOTE — ED Notes (Signed)
Patient transported to X-ray 

## 2015-01-25 ENCOUNTER — Telehealth: Payer: Self-pay

## 2015-01-25 NOTE — Telephone Encounter (Signed)
He is OK to proceed with surgery. Had a cath last year that showed nonobstructive disease.  [name] MD, [facility]

## 2015-01-25 NOTE — Telephone Encounter (Signed)
Returned call to patient Dr.Jordan advised ok to proceed with surgery.

## 2015-01-25 NOTE — Telephone Encounter (Signed)
Received a call from patient.He stated he went to ER 01/23/15 with chest pain.Stated he is ok.Lab and EKG normal.Stated he is scheduled to have hernia surgery first of year, he wanted to ask Dr.Jordan if he needs to see him before surgery.Stated he has had no chest pain since 01/23/15.Message sent to [place] for advice.

## 2015-02-05 ENCOUNTER — Encounter (HOSPITAL_COMMUNITY)
Admission: RE | Admit: 2015-02-05 | Discharge: 2015-02-05 | Disposition: A | Discharge status: Home / Self Care | Payer: Medicare Other | Source: Ambulatory Visit | Attending: General Surgery | Admitting: General Surgery

## 2015-02-05 ENCOUNTER — Encounter (HOSPITAL_COMMUNITY): Payer: Self-pay

## 2015-02-05 DIAGNOSIS — K4090 Unilateral inguinal hernia, without obstruction or gangrene, not specified as recurrent: Secondary | ICD-10-CM | POA: Insufficient documentation

## 2015-02-05 DIAGNOSIS — I10 Essential (primary) hypertension: Secondary | ICD-10-CM | POA: Insufficient documentation

## 2015-02-05 DIAGNOSIS — I452 Bifascicular block: Secondary | ICD-10-CM | POA: Diagnosis not present

## 2015-02-05 DIAGNOSIS — I252 Old myocardial infarction: Secondary | ICD-10-CM | POA: Diagnosis not present

## 2015-02-05 DIAGNOSIS — Z01812 Encounter for preprocedural laboratory examination: Secondary | ICD-10-CM | POA: Insufficient documentation

## 2015-02-05 DIAGNOSIS — E7800 Pure hypercholesterolemia, unspecified: Secondary | ICD-10-CM | POA: Insufficient documentation

## 2015-02-05 DIAGNOSIS — N289 Disorder of kidney and ureter, unspecified: Secondary | ICD-10-CM | POA: Insufficient documentation

## 2015-02-05 DIAGNOSIS — I2510 Atherosclerotic heart disease of native coronary artery without angina pectoris: Secondary | ICD-10-CM | POA: Insufficient documentation

## 2015-02-05 DIAGNOSIS — Z955 Presence of coronary angioplasty implant and graft: Secondary | ICD-10-CM | POA: Insufficient documentation

## 2015-02-05 DIAGNOSIS — Z01818 Encounter for other preprocedural examination: Secondary | ICD-10-CM | POA: Insufficient documentation

## 2015-02-05 DIAGNOSIS — Z87891 Personal history of nicotine dependence: Secondary | ICD-10-CM | POA: Diagnosis not present

## 2015-02-05 DIAGNOSIS — I480 Paroxysmal atrial fibrillation: Secondary | ICD-10-CM | POA: Diagnosis not present

## 2015-02-05 DIAGNOSIS — Z79899 Other long term (current) drug therapy: Secondary | ICD-10-CM | POA: Insufficient documentation

## 2015-02-05 HISTORY — DX: Unspecified cataract: H26.9

## 2015-02-05 HISTORY — DX: Unilateral inguinal hernia, without obstruction or gangrene, not specified as recurrent: K40.90

## 2015-02-05 HISTORY — DX: Heart failure, unspecified: I50.9

## 2015-02-05 HISTORY — DX: Presence of spectacles and contact lenses: Z97.3

## 2015-02-05 HISTORY — DX: Unspecified osteoarthritis, unspecified site: M19.90

## 2015-02-05 HISTORY — DX: Gastro-esophageal reflux disease without esophagitis: K21.9

## 2015-02-05 LAB — COMPREHENSIVE METABOLIC PANEL
ALK PHOS: 91 U/L (ref 38–126)
ALT: 12 U/L — ABNORMAL LOW (ref 17–63)
AST: 18 U/L (ref 15–41)
Albumin: 3.9 g/dL (ref 3.5–5.0)
Anion gap: 10 (ref 5–15)
BILIRUBIN TOTAL: 0.6 mg/dL (ref 0.3–1.2)
BUN: 42 mg/dL — AB (ref 6–20)
CALCIUM: 9.6 mg/dL (ref 8.9–10.3)
CO2: 26 mmol/L (ref 22–32)
CREATININE: 1.59 mg/dL — AB (ref 0.61–1.24)
Chloride: 104 mmol/L (ref 101–111)
GFR calc Af Amer: 44 mL/min — ABNORMAL LOW (ref >60)
GFR, EST NON AFRICAN AMERICAN: 38 mL/min — AB (ref >60)
Glucose, Bld: 91 mg/dL (ref 65–99)
POTASSIUM: 4.4 mmol/L (ref 3.5–5.1)
Sodium: 140 mmol/L (ref 135–145)
TOTAL PROTEIN: 7.3 g/dL (ref 6.5–8.1)

## 2015-02-05 LAB — CBC WITH DIFFERENTIAL/PLATELET
Basophils Absolute: 0 10*3/uL (ref 0.0–0.1)
Basophils Relative: 0 %
EOS ABS: 0.2 10*3/uL (ref 0.0–0.7)
EOS PCT: 3 %
HEMATOCRIT: 39.1 % (ref 39.0–52.0)
HEMOGLOBIN: 13.2 g/dL (ref 13.0–17.0)
LYMPHS ABS: 1.6 10*3/uL (ref 0.7–4.0)
Lymphocytes Relative: 25 %
MCH: 31.3 pg (ref 26.0–34.0)
MCHC: 33.8 g/dL (ref 30.0–36.0)
MCV: 92.7 fL (ref 78.0–100.0)
MONO ABS: 0.6 10*3/uL (ref 0.1–1.0)
MONOS PCT: 10 %
Neutro Abs: 3.9 10*3/uL (ref 1.7–7.7)
Neutrophils Relative %: 62 %
Platelets: 177 10*3/uL (ref 150–400)
RBC: 4.22 MIL/uL (ref 4.22–5.81)
RDW: 14 % (ref 11.5–15.5)
WBC: 6.3 10*3/uL (ref 4.0–10.5)

## 2015-02-05 LAB — PROTIME-INR
INR: 1.09 (ref 0.00–1.49)
PROTHROMBIN TIME: 14.3 s (ref 11.6–15.2)

## 2015-02-05 NOTE — Pre-Procedure Instructions (Signed)
Jonathan Acevedo  02/05/2015      [facility] [address], [place] [address] Phone: [phone] Fax: [place] [place] [address] [place] [address] [place] [address] Phone: [phone] Fax: [phone]    Your procedure is scheduled on Tuesday, February 09, 2015  Report to [facility] Admitting at 7:15 A.M.  Call this number if you have problems the morning of surgery:  [phone]   Remember:  Do not eat food or drink liquids after midnight Monday, February 08, 2015  Take these medicines the morning of surgery with A SIP OF WATER :if needed:HYDROcodone-acetaminophen (NORCO/VICODIN) for pain, nitroGLYCERIN (NITROSTAT) for chest pain  Stop taking Aspirin, vitamins, fish oil and herbal medications. Do not take any NSAIDs ie: Ibuprofen, Advil, Naproxen or any medication containing Aspirin stop now.   Do not wear jewelry, make-up or nail polish.  Do not wear lotions, powders, or perfumes.  You may not wear deodorant.  Do not shave 48 hours prior to surgery.  Men may shave face and neck.  Do not bring valuables to the hospital.  [facility] is not responsible for any belongings or valuables.  Contacts, dentures or bridgework may not be worn into surgery.  Leave your suitcase in the car.  After surgery it may be brought to your room.  For patients admitted to the hospital, discharge time will be determined by your treatment team.  Patients discharged the day of surgery will not be allowed to drive home.   Name and phone number of your driver:    Special instructions: Special Instructions:Special Instructions: [facility] - Preparing for Surgery  Before surgery, you can play an important role.  Because skin is not sterile, your skin needs to be as free of germs as possible.  You can reduce the number of germs on you skin by washing with  CHG (chlorahexidine gluconate) soap before surgery.  CHG is an antiseptic cleaner which kills germs and bonds with the skin to continue killing germs even after washing.  Please DO NOT use if you have an allergy to CHG or antibacterial soaps.  If your skin becomes reddened/irritated stop using the CHG and inform your nurse when you arrive at Short Stay.  Do not shave (including legs and underarms) for at least 48 hours prior to the first CHG shower.  You may shave your face.  Please follow these instructions carefully:   1.  Shower with CHG Soap the night before surgery and the morning of Surgery.  2.  If you choose to wash your hair, wash your hair first as usual with your normal shampoo.  3.  After you shampoo, rinse your hair and body thoroughly to remove the Shampoo.  4.  Use CHG as you would any other liquid soap.  You can apply chg directly  to the skin and wash gently with scrungie or a clean washcloth.  5.  Apply the CHG Soap to your body ONLY FROM THE NECK DOWN.  Do not use on open wounds or open sores.  Avoid contact with your eyes, ears, mouth and genitals (private parts).  Wash genitals (private parts) with your normal soap.  6.  Wash thoroughly, paying special attention to the area where your surgery will be performed.  7.  Thoroughly rinse your body with warm water from the neck down.  8.  DO NOT shower/wash with your normal soap after using and rinsing off the CHG Soap.  9.  Pat yourself dry with a clean towel.            10.  Wear clean pajamas.            11.  Place clean sheets on your bed the night of your first shower and do not sleep with pets.  Day of Surgery  Do not apply any lotions/deodorants the morning of surgery.  Please wear clean clothes to the hospital/surgery center.  Please read over the following fact sheets that you were given. Pain Booklet, Coughing and Deep Breathing and Surgical Site Infection Prevention

## 2015-02-05 NOTE — Progress Notes (Signed)
Pt denies SOB and chest pain but is under the care of Dr. Martinique , cardiology. Spoke with Ebony Hail, PA, anesthesia, regarding pt history and cardiac clearance note in Epic. Ebony Hail, PA, anesthesia, advised that EKG be repeated.

## 2015-02-05 NOTE — Progress Notes (Signed)
Anesthesia PAT Evaluation: Patient is a 81 year old male scheduled for right IHR on 02/09/15 by Dr. Zella Richer.  History includes former smoker, CAD/anterior MI s/p stent '98, moderate AS [id number], combined systolic and diastolic CHF (last admission 08/2013), syncope status post loop recorder 05/19/2013 (EP study showed no inducible VT or SVT; syncope felt likely related to Flomax), atrial fib/PAF, hypertension, renal insufficiency, nephrolithiasis, hypercholesterolemia, parathyroid adenoma status post parathyroidectomy 0000000, umbilical hernia repair. ED visit on 01/22/15 for chest pain. He ruled out for MI by enzymes. His cardiologist Dr. Martinique, last office visit 12/17/14, was notified but did not feel he needed to see patient prior to surgery and felt patient was "ok to proceed with surgery." He had previous admission for CHF/CP 08/11/13 and multiple ED visits including 09/20/13, 08/15/14, 08/30/14. He has had consistent post-ED/hospitalization follow-up with continued medical therapy recommended as cardiac cath in 05/2013 showed only 20% lesions, EF 35-40%, mild-moderate AS.   PCP is listed as Mauricio Po, FNP.  Meds include aspirin 325 mg (on hold), Lipitor, Lasix, Os-Cal with D, nitroglycerin, Norco, K-Dur.  PAT Vitals: BP 105/58, HR 49, RR 20, T 36.6C, 96% RA.  Exam shows a pleasant Caucasian male in NAD. No conversational dyspnea. He is sitting in a hospital W/C. Heart regular rhythm, bradycardic. III/VI harsh murmur. Lungs clear. Mild generalized BLE edema. Large varicosity LLE, posterior calf. He does have several missing teeth (particularly lower). He denied any recurrent chest pain. Michela Pitcher that he feels like his chest pain is related to the stress of taking care of his ill wife who has recurrent UTIs and is currently home being treated for C. difficile colitis. He enjoys swimming and was doing 10-12 labs on a regular basis until 01/21/2015 when the pool was closed temporarily for renovations. He denied any  exertional chest pain. He denies shortness of breath at rest. No further syncope. He is able lie flat although he prefers to sleep on his left side. He has been able to manage his volume status by watching his salt intake and taking furosemide. He reports he chronically is bradycardic with average heart rate around 48-54 bpm but occasionally documented in the high 30s. He has been asymptomatic of this, specifically denied dizziness or near syncope.  02/05/15 EKG: SB at 43 bpm, possible LAE, incomplete right BBB, LAFB, anterolateral infarct (age undetermined). PVCs have resolved since 01/22/15 tracing. EKG appears stable when compared to 08/30/14 tracing.   01/01/15 Loop recorder interrogation: Battery status OK. Normal device function. No new symptom episodes, tachy episodes, brady, or pause episodes. No new AF episodes. Monthly summary reports and ROV/PRN.  01/06/15 Echo: Study Conclusions - Left ventricle: Septal , apical inferior apical and distal anterior wall akinesis The cavity size was moderately dilated. Wall thickness was increased in a pattern of mild LVH. Systolic function was moderately to severely reduced. The estimated ejection fraction was 30%. Doppler parameters are consistent with abnormal left ventricular relaxation (grade 1 diastolic dysfunction). - Aortic valve: There was moderate stenosis. There was mild regurgitation. Valve area (VTI): 0.92 cm^2. Valve area (Vmax): 0.93 cm^2. Valve area (Vmean): 0.88 cm^2. - Mitral valve: Calcified annulus. Valve area by pressure half-time: 1.38 cm^2. Valve area by continuity equation (using LVOT flow): 1.38 cm^2. - Left atrium: The atrium was moderately dilated. - Atrial septum: No defect or patent foramen ovale was identified. - Pulmonary arteries: PA peak pressure: 32 mm Hg (S). (Comparison echo 05/18/13 showed LVEF 40-45%, moderate AS with mean gradient of 24 mmHg.)  05/19/13 Cardiac cath (  Dr. Martinique; done for  evaluation of syncope, CHF, PVCs): Procedural Findings:  Hemodynamics:  AO 141/78 mean 107 mm Hg  LV 157/28 mm Hg  AV gradient of 19 mm Hg Coronary angiography: Coronary dominance: right - Left mainstem: 20% ostial disease - Left anterior descending (LAD): There is a segment of stents in the proximal LAD that is widely patent. No obstructive disease. - Left circumflex (LCx): Large vessel with 20% disease in the proximal vessel.  - Right coronary artery (RCA): Moderately calcified with 20% disease in the proximal and mid vessel.  Left ventriculography: Left ventricular systolic function is abnormal, there is akinesis of the mid to distal anterior wall and entire apex. LVEF is estimated at 35-40%, there is no significant mitral regurgitation  Final Conclusions:  1. Nonobstructive CAD 2. Moderate to severe LV dysfunction. 3. Mild to moderate aortic stenosis by gradient. 4. Elevated LVEDP. Recommendations: proceed with EP study. Needs more diuresis.  01/23/15 CXR: IMPRESSION: No acute pulmonary process.  Preoperative labs noted. Cr 1.59, stable. CBC, PT/INR WNL.   He has a significant cardiac history as outlined above. He has been cleared by his cardiologist. Patient is still quite anxious about having surgery. He is concerned he is too high risk. Discussed with anesthesiologist Dr. Kalman Shan. As long as patient remains asymptomatic and compliant with his medical management, we feel he is moderate risk. Patient understands that he should be at baseline for surgery. Further evaluation by his anesthesiologist on the day of surgery to ensure no acute changes.  [facility] Short Stay Center/Anesthesiology Phone [phone] 02/05/2015 2:52 PM

## 2015-02-08 ENCOUNTER — Encounter (HOSPITAL_COMMUNITY): Payer: Self-pay | Admitting: Anesthesiology

## 2015-02-08 MED ORDER — CEFAZOLIN SODIUM-DEXTROSE 2-3 GM-% IV SOLR
2.0000 g | INTRAVENOUS | Status: AC
  Administered 2015-02-09: 2 g via INTRAVENOUS
  Filled 2015-02-08: qty 50

## 2015-02-09 ENCOUNTER — Ambulatory Visit (HOSPITAL_COMMUNITY): Payer: Medicare Other | Admitting: Anesthesiology

## 2015-02-09 ENCOUNTER — Encounter (HOSPITAL_COMMUNITY)
Admission: RE | Disposition: A | Discharge status: Home / Self Care | Payer: Self-pay | Source: Ambulatory Visit | Attending: General Surgery

## 2015-02-09 ENCOUNTER — Ambulatory Visit (HOSPITAL_COMMUNITY): Payer: Medicare Other | Admitting: Vascular Surgery

## 2015-02-09 ENCOUNTER — Ambulatory Visit (HOSPITAL_COMMUNITY)
Admission: RE | Admit: 2015-02-09 | Discharge: 2015-02-09 | Disposition: A | Discharge status: Home / Self Care | Payer: Medicare Other | Source: Ambulatory Visit | Attending: General Surgery | Admitting: General Surgery

## 2015-02-09 ENCOUNTER — Encounter (HOSPITAL_COMMUNITY): Payer: Self-pay | Admitting: *Deleted

## 2015-02-09 DIAGNOSIS — E7800 Pure hypercholesterolemia, unspecified: Secondary | ICD-10-CM | POA: Insufficient documentation

## 2015-02-09 DIAGNOSIS — I252 Old myocardial infarction: Secondary | ICD-10-CM | POA: Insufficient documentation

## 2015-02-09 DIAGNOSIS — Z882 Allergy status to sulfonamides status: Secondary | ICD-10-CM | POA: Diagnosis not present

## 2015-02-09 DIAGNOSIS — K219 Gastro-esophageal reflux disease without esophagitis: Secondary | ICD-10-CM | POA: Diagnosis not present

## 2015-02-09 DIAGNOSIS — Z955 Presence of coronary angioplasty implant and graft: Secondary | ICD-10-CM | POA: Diagnosis not present

## 2015-02-09 DIAGNOSIS — I4891 Unspecified atrial fibrillation: Secondary | ICD-10-CM | POA: Insufficient documentation

## 2015-02-09 DIAGNOSIS — I2510 Atherosclerotic heart disease of native coronary artery without angina pectoris: Secondary | ICD-10-CM | POA: Insufficient documentation

## 2015-02-09 DIAGNOSIS — G8918 Other acute postprocedural pain: Secondary | ICD-10-CM | POA: Diagnosis not present

## 2015-02-09 DIAGNOSIS — I10 Essential (primary) hypertension: Secondary | ICD-10-CM | POA: Diagnosis not present

## 2015-02-09 DIAGNOSIS — I509 Heart failure, unspecified: Secondary | ICD-10-CM | POA: Insufficient documentation

## 2015-02-09 DIAGNOSIS — Z87891 Personal history of nicotine dependence: Secondary | ICD-10-CM | POA: Diagnosis not present

## 2015-02-09 DIAGNOSIS — K4090 Unilateral inguinal hernia, without obstruction or gangrene, not specified as recurrent: Secondary | ICD-10-CM | POA: Diagnosis not present

## 2015-02-09 HISTORY — PX: INSERTION OF MESH: SHX5868

## 2015-02-09 HISTORY — PX: INGUINAL HERNIA REPAIR: SHX194

## 2015-02-09 SURGERY — REPAIR, HERNIA, INGUINAL, ADULT
Anesthesia: Choice | Site: Abdomen | Laterality: Right

## 2015-02-09 MED ORDER — FENTANYL CITRATE (PF) 100 MCG/2ML IJ SOLN
INTRAMUSCULAR | Status: DC | PRN
  Administered 2015-02-09 (×2): 50 ug via INTRAVENOUS

## 2015-02-09 MED ORDER — SUGAMMADEX SODIUM 200 MG/2ML IV SOLN
INTRAVENOUS | Status: AC
  Filled 2015-02-09: qty 2

## 2015-02-09 MED ORDER — BUPIVACAINE-EPINEPHRINE (PF) 0.5% -1:200000 IJ SOLN
INTRAMUSCULAR | Status: DC | PRN
  Administered 2015-02-09: 30 mL

## 2015-02-09 MED ORDER — LACTATED RINGERS IV SOLN
INTRAVENOUS | Status: DC | PRN
  Administered 2015-02-09: 09:00:00 via INTRAVENOUS

## 2015-02-09 MED ORDER — BUPIVACAINE-EPINEPHRINE (PF) 0.5% -1:200000 IJ SOLN
INTRAMUSCULAR | Status: AC
  Filled 2015-02-09: qty 30

## 2015-02-09 MED ORDER — SUGAMMADEX SODIUM 200 MG/2ML IV SOLN
INTRAVENOUS | Status: DC | PRN
  Administered 2015-02-09: 173.2 mg via INTRAVENOUS

## 2015-02-09 MED ORDER — MIDAZOLAM HCL 2 MG/2ML IJ SOLN: 0.5000 mg | Freq: Once | INTRAMUSCULAR | Status: DC | PRN

## 2015-02-09 MED ORDER — GLYCOPYRROLATE 0.2 MG/ML IJ SOLN
INTRAMUSCULAR | Status: AC
  Filled 2015-02-09: qty 1

## 2015-02-09 MED ORDER — ONDANSETRON HCL 4 MG/2ML IJ SOLN
INTRAMUSCULAR | Status: DC | PRN
  Administered 2015-02-09: 4 mg via INTRAVENOUS

## 2015-02-09 MED ORDER — FENTANYL CITRATE (PF) 100 MCG/2ML IJ SOLN
INTRAMUSCULAR | Status: AC
  Filled 2015-02-09: qty 2

## 2015-02-09 MED ORDER — MEPERIDINE HCL 25 MG/ML IJ SOLN: 6.2500 mg | INTRAMUSCULAR | Status: DC | PRN

## 2015-02-09 MED ORDER — LIDOCAINE HCL (CARDIAC) 20 MG/ML IV SOLN
INTRAVENOUS | Status: AC
  Filled 2015-02-09: qty 5

## 2015-02-09 MED ORDER — ETOMIDATE 2 MG/ML IV SOLN
INTRAVENOUS | Status: DC | PRN
  Administered 2015-02-09: 16 mg via INTRAVENOUS

## 2015-02-09 MED ORDER — PHENYLEPHRINE 40 MCG/ML (10ML) SYRINGE FOR IV PUSH (FOR BLOOD PRESSURE SUPPORT)
PREFILLED_SYRINGE | INTRAVENOUS | Status: AC
  Filled 2015-02-09: qty 10

## 2015-02-09 MED ORDER — PROPOFOL 10 MG/ML IV BOLUS
INTRAVENOUS | Status: AC
  Filled 2015-02-09: qty 20

## 2015-02-09 MED ORDER — BUPIVACAINE-EPINEPHRINE 0.5% -1:200000 IJ SOLN
INTRAMUSCULAR | Status: DC | PRN
  Administered 2015-02-09: 30 mL

## 2015-02-09 MED ORDER — ROCURONIUM BROMIDE 50 MG/5ML IV SOLN
INTRAVENOUS | Status: AC
  Filled 2015-02-09: qty 1

## 2015-02-09 MED ORDER — SUCCINYLCHOLINE CHLORIDE 20 MG/ML IJ SOLN
INTRAMUSCULAR | Status: AC
  Filled 2015-02-09: qty 1

## 2015-02-09 MED ORDER — 0.9 % SODIUM CHLORIDE (POUR BTL) OPTIME
TOPICAL | Status: DC | PRN
  Administered 2015-02-09: 1000 mL

## 2015-02-09 MED ORDER — OXYCODONE HCL 5 MG PO TABS
ORAL_TABLET | ORAL | Status: AC
  Administered 2015-02-09: 10 mg
  Filled 2015-02-09: qty 2

## 2015-02-09 MED ORDER — OXYCODONE HCL 5 MG PO TABS: 5.0000 mg | ORAL_TABLET | ORAL | Status: DC | PRN

## 2015-02-09 MED ORDER — PROMETHAZINE HCL 25 MG/ML IJ SOLN: 6.2500 mg | INTRAMUSCULAR | Status: DC | PRN

## 2015-02-09 MED ORDER — FENTANYL CITRATE (PF) 250 MCG/5ML IJ SOLN
INTRAMUSCULAR | Status: AC
  Filled 2015-02-09: qty 5

## 2015-02-09 MED ORDER — ROCURONIUM BROMIDE 100 MG/10ML IV SOLN
INTRAVENOUS | Status: DC | PRN
  Administered 2015-02-09: 35 mg via INTRAVENOUS

## 2015-02-09 MED ORDER — LACTATED RINGERS IV SOLN
INTRAVENOUS | Status: DC
  Administered 2015-02-09: 08:00:00 via INTRAVENOUS

## 2015-02-09 MED ORDER — FENTANYL CITRATE (PF) 100 MCG/2ML IJ SOLN
25.0000 ug | INTRAMUSCULAR | Status: DC | PRN
  Administered 2015-02-09: 50 ug via INTRAVENOUS
  Administered 2015-02-09: 25 ug via INTRAVENOUS

## 2015-02-09 SURGICAL SUPPLY — 53 items
APL SKNCLS STERI-STRIP NONHPOA (GAUZE/BANDAGES/DRESSINGS) ×1
BENZOIN TINCTURE PRP APPL 2/3 (GAUZE/BANDAGES/DRESSINGS) ×3 IMPLANT
BLADE SURG 10 STRL SS (BLADE) ×3 IMPLANT
BLADE SURG 15 STRL LF DISP TIS (BLADE) ×1 IMPLANT
BLADE SURG 15 STRL SS (BLADE) ×3
BLADE SURG ROTATE 9660 (MISCELLANEOUS) IMPLANT
CHLORAPREP W/TINT 26ML (MISCELLANEOUS) ×3 IMPLANT
CLOSURE WOUND 1/2 X4 (GAUZE/BANDAGES/DRESSINGS) ×1
COVER SURGICAL LIGHT HANDLE (MISCELLANEOUS) ×3 IMPLANT
DECANTER SPIKE VIAL GLASS SM (MISCELLANEOUS) ×2 IMPLANT
DRAIN PENROSE 1/2X12 LTX STRL (WOUND CARE) ×2 IMPLANT
DRAPE INCISE IOBAN 66X45 STRL (DRAPES) ×3 IMPLANT
DRAPE LAPAROTOMY TRNSV 102X78 (DRAPE) ×3 IMPLANT
DRAPE UTILITY XL STRL (DRAPES) ×4 IMPLANT
DRSG TEGADERM 4X4.75 (GAUZE/BANDAGES/DRESSINGS) ×3 IMPLANT
DRSG TELFA 3X8 NADH (GAUZE/BANDAGES/DRESSINGS) ×3 IMPLANT
ELECT CAUTERY BLADE 6.4 (BLADE) ×3 IMPLANT
ELECT REM PT RETURN 9FT ADLT (ELECTROSURGICAL) ×3
ELECTRODE REM PT RTRN 9FT ADLT (ELECTROSURGICAL) ×1 IMPLANT
GAUZE SPONGE 4X4 16PLY XRAY LF (GAUZE/BANDAGES/DRESSINGS) ×3 IMPLANT
GLOVE BIOGEL PI IND STRL 6.5 (GLOVE) IMPLANT
GLOVE BIOGEL PI IND STRL 7.5 (GLOVE) IMPLANT
GLOVE BIOGEL PI IND STRL 8 (GLOVE) ×1 IMPLANT
GLOVE BIOGEL PI INDICATOR 6.5 (GLOVE) ×2
GLOVE BIOGEL PI INDICATOR 7.5 (GLOVE) ×2
GLOVE BIOGEL PI INDICATOR 8 (GLOVE) ×2
GLOVE ECLIPSE 8.0 STRL XLNG CF (GLOVE) ×3 IMPLANT
GLOVE SURG SS PI 6.5 STRL IVOR (GLOVE) ×2 IMPLANT
GOWN STRL REUS W/ TWL LRG LVL3 (GOWN DISPOSABLE) ×2 IMPLANT
GOWN STRL REUS W/TWL LRG LVL3 (GOWN DISPOSABLE) ×6
KIT BASIN OR (CUSTOM PROCEDURE TRAY) ×3 IMPLANT
KIT ROOM TURNOVER OR (KITS) ×3 IMPLANT
MESH HERNIA 3X6 (Mesh General) ×2 IMPLANT
NDL HYPO 25GX1X1/2 BEV (NEEDLE) ×1 IMPLANT
NEEDLE HYPO 25GX1X1/2 BEV (NEEDLE) ×3 IMPLANT
NS IRRIG 1000ML POUR BTL (IV SOLUTION) ×3 IMPLANT
PACK SURGICAL SETUP 50X90 (CUSTOM PROCEDURE TRAY) ×3 IMPLANT
PAD ARMBOARD 7.5X6 YLW CONV (MISCELLANEOUS) ×3 IMPLANT
PAD DRESSING TELFA 3X8 NADH (GAUZE/BANDAGES/DRESSINGS) ×1 IMPLANT
PENCIL BUTTON HOLSTER BLD 10FT (ELECTRODE) ×3 IMPLANT
SPECIMEN JAR SMALL (MISCELLANEOUS) IMPLANT
SPONGE LAP 18X18 X RAY DECT (DISPOSABLE) ×3 IMPLANT
STRIP CLOSURE SKIN 1/2X4 (GAUZE/BANDAGES/DRESSINGS) ×2 IMPLANT
SUT MON AB 4-0 PC3 18 (SUTURE) ×3 IMPLANT
SUT PROLENE 2 0 CT2 30 (SUTURE) ×6 IMPLANT
SUT SILK 2 0 SH (SUTURE) IMPLANT
SUT VIC AB 2-0 SH 18 (SUTURE) ×3 IMPLANT
SUT VIC AB 3-0 SH 27 (SUTURE) ×6
SUT VIC AB 3-0 SH 27XBRD (SUTURE) ×1 IMPLANT
SUT VICRYL AB 3 0 TIES (SUTURE) ×3 IMPLANT
SYR CONTROL 10ML LL (SYRINGE) ×3 IMPLANT
TOWEL OR 17X24 6PK STRL BLUE (TOWEL DISPOSABLE) ×1 IMPLANT
TOWEL OR 17X26 10 PK STRL BLUE (TOWEL DISPOSABLE) ×3 IMPLANT

## 2015-02-09 NOTE — H&P (View-Only) (Signed)
Jonathan Acevedo  DOB: 02/10/1928 Married / Language: English / Race: White Male   History of Present Illness Patient words: Hernia.  The patient is a 82 year old male.  Note:He was referred by Dr. [name] because of a painful right inguinal hernia. He is noted significant pain when he has been on his feet for some time. No significant bulge is present. It does not interfere with his bowel or bladder habits. No difficulty starting urinary stream. No constipation unless he takes hydrocodone for his chronic back pain. Of note is that his wife was recently discharged from hospital after being treated for C. difficile colitis and is improving. His past history is notable for coronary disease status post myocardial infarction, hypertension, chronic renal insufficiency, aortic stenosis, chronic congestive heart failure, chronic venous insufficiency.  He saw Dr. Martinique who feels is is not at increased cardiac risk for hernia repair.  Other Problems  Back Pain Chest pain Congestive Heart Failure Heart murmur Hemorrhoids Hypercholesterolemia Inguinal Hernia Kidney Stone Myocardial infarction Thyroid Disease Umbilical Hernia Repair  Past Surgical HistoryBypass Surgery for Poor Blood Flow to Legs Cataract Surgery Left. Colon Polyp Removal - Colonoscopy Hip Surgery Right. Open Inguinal Hernia Surgery Left. Thyroid Surgery Ventral / Umbilical Hernia Surgery Left.  Diagnostic Studies History Colonoscopy 5-10 years ago  Allergies  Sulphur-Heel *ASSORTED CLASSES*  Medication History Jeralyn Ruths, CMA; 12/23/2014 8:50 AM) Atorvastatin Calcium (40MG  Tablet, d Oral) Active. Potassium Chloride C[facility] Tablet ER, Oral daily) Active. Furosemide (40MG  Tablet, Oral daily) Active. Hydrocodone-Acetaminophen (5-325MG  Tablet, Oral prn) Active. Nitroglycerin (0.4MG  Tab Sublingual, Sublingual prn) Active. Calcium-Magnesium (500-250MG  Tablet, Oral daily)  Active. Medications Reconciled  Social History  Alcohol use Moderate alcohol use. Caffeine use Carbonated beverages, Coffee, Tea. No drug use Tobacco use Former smoker.  Family History  Alcohol Abuse Father, Mother.    Review of Systems  General Present- Appetite Loss. Not Present- Chills, Fatigue, Fever, Night Sweats, Weight Gain and Weight Loss. Skin Not Present- Change in Wart/Mole, Dryness, Hives, Jaundice, New Lesions, Non-Healing Wounds, Rash and Ulcer. HEENT Present- Hearing Loss and Wears glasses/contact lenses. Not Present- Earache, Hoarseness, Nose Bleed, Oral Ulcers, Ringing in the Ears, Seasonal Allergies, Sinus Pain, Sore Throat, Visual Disturbances and Yellow Eyes. Respiratory Not Present- Bloody sputum, Chronic Cough, Difficulty Breathing, Snoring and Wheezing. Breast Not Present- Breast Mass, Breast Pain, Nipple Discharge and Skin Changes. Cardiovascular Present- Leg Cramps. Not Present- Chest Pain, Difficulty Breathing Lying Down, Palpitations, Rapid Heart Rate, Shortness of Breath and Swelling of Extremities. Gastrointestinal Present- Abdominal Pain and Constipation. Not Present- Bloating, Bloody Stool, Change in Bowel Habits, Chronic diarrhea, Difficulty Swallowing, Excessive gas, Gets full quickly at meals, Hemorrhoids, Indigestion, Nausea, Rectal Pain and Vomiting. Male Genitourinary Not Present- Blood in Urine, Change in Urinary Stream, Frequency, Impotence, Nocturia, Painful Urination, Urgency and Urine Leakage. Musculoskeletal Present- Back Pain. Not Present- Joint Pain, Joint Stiffness, Muscle Pain, Muscle Weakness and Swelling of Extremities. Hematology Present- Easy Bruising. Not Present- Excessive bleeding, Gland problems, HIV and Persistent Infections.   Physical Exam  The physical exam findings are as follows: Note:General: WDWN in NAD. Pleasant and cooperative.  HEENT: [place]/AT, no facial masses  CV: RRR  CHEST: Breath sounds equal and clear.  Respirations nonlabored.  BREASTS: Symmetrical in size. No dominant masses, nipple discharge or suspicious skin lesions.  ABDOMEN: Soft, nontender, subumbilical scar  GU: Tender small right inguinal bulge that increases slightly with a cough. Left groin scar with no bulging.  NEUROLOGIC: Alert and oriented, answers  questions appropriately, normal gait and station.  PSYCHIATRIC: Normal mood, affect , and behavior.    Assessment & Plan  INGUINAL HERNIA OF RIGHT SIDE WITHOUT OBSTRUCTION OR GANGRENE (K40.90) Impression: The hernia is intermittently symptomatic with certain activities.  Plan: Proceed with right inguinal hernia surgery, we will go ahead and get him scheduled. I have asked him to call me once all these things have been done.  I have explained the procedure, risks, and aftercare of inguinal hernia repair. Risks include but are not limited to bleeding, infection, wound problems, anesthesia, recurrence, bladder or intestine injury, urinary retention, testicular dysfunction, chronic pain, mesh problems. He seems to understand and agrees with the plan.

## 2015-02-09 NOTE — Op Note (Signed)
OPERATIVE NOTE:  INGUINAL HERNIA REPAIR  Preoperative diagnosis:  Right inguinal hernia.  Postoperative diagnosis:  Same  Procedure:  Right inguinal hernia repair with mesh.  Surgeon:  Jackolyn Confer, M.D.  Anesthesia:  General/LMA with local (Marcaine).  Indication:  This is an 82 year old male with a symptomatic right inguinal hernia.  He presents for elective repair.  Procedure, risks and aftercare were discussed with him previously.  Technique:  He was seen in the holding room and the right groin was marked with my initials. He was brought to the operating, placed supine on the operating table, and the anesthetic was administered by the anesthesiologist. The hair in the groin area was clipped as was felt to be necessary. This area was then sterilely prepped and draped. A timeout was performed.  Local anesthetic was infiltrated in the superficial and deep tissues in the right groin.  An incision was made through the skin and subcutaneous tissue until the external oblique aponeurosis was identified.  Local anesthetic was infiltrated deep to the external oblique aponeurosis. The external oblique aponeurosis was divided through the external ring medially and back toward the anterior superior iliac spine laterally. Using blunt dissection, the shelving edge of the inguinal ligament was identified inferiorly and the internal oblique aponeurosis and muscle were identified superiorly. The ilioinguinal nerve was identified and preserved.  The spermatic cord was isolated and a posterior window was made around it. An indirect hernia sac was identified and separated from the spermatic cord using blunt dissection. The hernia sac and its contents were reduced through the indirect hernia defect.   A piece of 3" x 6" polypropylene mesh was brought into the field and anchored 1-2 cm medial to the pubic tubercle with 2-0 Prolene suture. The inferior aspect of the mesh was anchored to the shelving edge of the  inguinal ligament with running 2-0 Prolene suture to a level 1-2 cm lateral to the internal ring. A slit was cut in the mesh creating 2 tails. These were wrapped around the spermatic cord. The superior aspect of the mesh was anchored to the internal oblique aponeurosis and muscle with interrupted 2-0 Vicryl sutures. The 2 tails of the mesh were then crossed creating a new internal ring and were anchored to the shelving edge of the inguinal ligament with 2-0 Prolene suture. The tip of a hemostat could be placed through the new aperture. The lateral aspect of the mesh was then tucked deep to the external oblique aponeurosis.  The wound was inspected and hemostasis was adequate. The external oblique aponeurosis was then closed over the mesh and cord with running 3-0 Vicryl suture. The subcutaneous tissue was closed with running 3-0 Vicryl suture. The skin closed with a running 4-0 Monocryl subcuticular stitch.  Steri-Strips and a sterile dressing were applied.  The procedure was well-tolerated without any apparent complications and he was taken to the recovery room in satisfactory condition.

## 2015-02-09 NOTE — Anesthesia Postprocedure Evaluation (Signed)
Anesthesia Post Note  Patient: Jonathan Acevedo  Procedure(s) Performed: Procedure(s) (LRB): RIGHT INGUINAL HERNIA REPAIR W/ MESH (Right) INSERTION OF MESH to right inguinal hernia (Right)  Patient location during evaluation: PACU Anesthesia Type: General Level of consciousness: awake and alert Pain management: pain level controlled Vital Signs Assessment: post-procedure vital signs reviewed and stable Respiratory status: spontaneous breathing, nonlabored ventilation, respiratory function stable and patient connected to nasal cannula oxygen Cardiovascular status: blood pressure returned to baseline and stable Postop Assessment: no signs of nausea or vomiting Anesthetic complications: no    Last Vitals:  Filed Vitals:   02/09/15 1130 02/09/15 1145  BP: 131/66 129/80  Pulse: 67 64  Temp:    Resp: 16 15    Last Pain:  Filed Vitals:   02/09/15 1151  PainSc: 7                  [name],[name]

## 2015-02-09 NOTE — Anesthesia Procedure Notes (Addendum)
Procedure Name: Intubation Date/Time: 02/09/2015 9:41 AM Performed by: Eligha Bridegroom Pre-anesthesia Checklist: Patient identified, Timeout performed, Emergency Drugs available, Suction available and Patient being monitored Patient Re-evaluated:Patient Re-evaluated prior to inductionOxygen Delivery Method: Circle system utilized Preoxygenation: Pre-oxygenation with 100% oxygen Intubation Type: IV induction Ventilation: Mask ventilation without difficulty Laryngoscope Size: Mac and 4 Grade View: Grade I Tube type: Oral Tube size: 7.5 mm Number of attempts: 1 Airway Equipment and Method: Stylet and LTA kit utilized Placement Confirmation: ETT inserted through vocal cords under direct vision,  breath sounds checked- equal and bilateral and positive ETCO2 Secured at: 21 cm Tube secured with: Tape Dental Injury: Teeth and Oropharynx as per pre-operative assessment    Anesthesia Regional Block:  TAP block  Pre-Anesthetic Checklist: ,, timeout performed, Correct Patient, Correct Site, Correct Laterality, Correct Procedure, Correct Position, site marked, Risks and benefits discussed,  Surgical consent,  Pre-op evaluation,  At surgeon's request and post-op pain management  Laterality: Right  Prep: chloraprep       Needles:  Injection technique: Single-shot  Needle Type: Echogenic Stimulator Needle     Needle Length: 9cm 9 cm Needle Gauge: 22 and 22 G    Additional Needles:  Procedures: ultrasound guided (picture in chart) TAP block Narrative:  Start time: 02/09/2015 9:18 AM End time: 02/09/2015 9:24 AM Injection made incrementally with aspirations every 5 mL.  Performed by: Personally  Anesthesiologist: Glennon Mac, Demario Faniel  Additional Notes: Pt identified in Holding room.  Monitors applied. Working IV access confirmed. Sterile prep, drape R flank.  #22ga ECHOgenic needle into TAP with US guidance.  30cc 0.5% Bupivacaine with 1:200k epi injected incrementally after negative test  dose, good spread of local anesthetic.  Patient asymptomatic, VSS, no heme aspirated, tolerated well.  Jenita Seashore, MD

## 2015-02-09 NOTE — Anesthesia Preprocedure Evaluation (Addendum)
Anesthesia Evaluation  Patient identified by MRN, date of birth, ID band Patient awake    Reviewed: Allergy & Precautions, NPO status , Patient's Chart, lab work & pertinent test results  History of Anesthesia Complications Negative for: history of anesthetic complications  Airway Mallampati: II  TM Distance: >3 FB Neck ROM: Full    Dental  (+) Poor Dentition, Chipped, Missing, Dental Advisory Given   Pulmonary former smoker (quit 1998),    breath sounds clear to auscultation       Cardiovascular hypertension, Pt. on medications (-) angina+ CAD, + Past MI and + Cardiac Stents  + dysrhythmias Atrial Fibrillation + Valvular Problems/Murmurs AS  Rhythm:Regular Rate:Normal  11/16 ECHO: EF 30%, moderate AS, mild AI. Valve area (VTI): 0.92 cm^2, mild TR   Neuro/Psych negative neurological ROS     GI/Hepatic Neg liver ROS, GERD  Controlled,  Endo/Other  negative endocrine ROS  Renal/GU Renal InsufficiencyRenal disease (creat 1.59)     Musculoskeletal  (+) Arthritis ,   Abdominal   Peds  Hematology negative hematology ROS (+)   Anesthesia Other Findings   Reproductive/Obstetrics                            Anesthesia Physical Anesthesia Plan  ASA: III  Anesthesia Plan: General   Post-op Pain Management: GA combined w/ Regional for post-op pain   Induction: Intravenous  Airway Management Planned: Oral ETT  Additional Equipment:   Intra-op Plan:   Post-operative Plan: Extubation in OR  Informed Consent: I have reviewed the patients History and Physical, chart, labs and discussed the procedure including the risks, benefits and alternatives for the proposed anesthesia with the patient or authorized representative who has indicated his/her understanding and acceptance.   Dental advisory given  Plan Discussed with: CRNA and Surgeon  Anesthesia Plan Comments: (Plan routine monitors, GETA  with TAP block for post op analgesia)        Anesthesia Quick Evaluation

## 2015-02-09 NOTE — Discharge Instructions (Signed)
[facility] _______[facility]  UMBILICAL OR INGUINAL HERNIA REPAIR: POST OP INSTRUCTIONS  Always review your discharge instruction sheet given to you by the facility where your surgery was performed. IF YOU HAVE DISABILITY OR FAMILY LEAVE FORMS, YOU MUST BRING THEM TO THE OFFICE FOR PROCESSING.   DO NOT GIVE THEM TO YOUR DOCTOR.  1. A  prescription for pain medication may be given to you upon discharge.  Take your pain medication as prescribed, if needed.  If narcotic pain medicine is not needed, then you may take acetaminophen (Tylenol) or ibuprofen (Advil) as needed. 2. Take your usually prescribed medications unless otherwise directed. 3. If you need a refill on your pain medication, please contact your pharmacy.  They will contact our office to request authorization. Prescriptions will not be filled after 5 pm or on week-ends. 4. You should follow a light diet the first 24 hours after arrival home, such as soup and crackers, etc.  Be sure to include lots of fluids daily.  Resume your normal diet the day after surgery. 5. Most patients will experience some swelling and bruising around the umbilicus or in the groin and scrotum.  Ice packs and reclining will help.  Swelling and bruising can take several days to resolve.  6. It is common to experience some constipation if taking pain medication after surgery.  Increasing fluid intake and taking a stool softener (such as Colace) will usually help or prevent this problem from occurring.  A mild laxative (Milk of Magnesia or Miralax) should be taken according to package directions if there are no bowel movements after 48 hours. 7. Unless discharge instructions indicate otherwise, you may remove your bandages 72 hours after surgery; you may shower beginning the day after your surgery.  You may have steri-strips (small skin tapes) in place directly over the incision.  These strips should be left on the skin until they fall off.  If your surgeon  used skin glue on the incision, you may shower in 24 hours.  The glue will flake off over the next 2-3 weeks.  Any sutures or staples will be removed at the office during your follow-up visit. 8. ACTIVITIES:  You may resume regular (light) daily activities beginning the next day--such as daily self-care, walking, climbing stairs--gradually increasing activities as tolerated.  You may have sexual intercourse when it is comfortable.  Refrain from any heavy lifting or straining-nothing over 10 pounds for 6 weeks.  a. You may drive when you are no longer taking prescription pain medication, you can comfortably wear a seatbelt, and you can safely maneuver your car and apply brakes. b. RETURN TO WORK:  __________________________________________________________ 9. You should see your doctor in the office for a follow-up appointment approximately 2-3 weeks after your surgery.  Make sure that you call for this appointment within a day or two after you arrive home to insure a convenient appointment time. 10. OTHER INSTRUCTIONS:  __________________________________________________________________________________________________________________________________________________________________________________________  WHEN TO CALL YOUR DOCTOR: 1. Fever over 101.0 2. Inability to urinate 3. Nausea and/or vomiting 4. Extreme swelling or bruising 5. Continued bleeding from incision. 6. Increased pain, redness, or drainage from the incision  The clinic staff is available to answer your questions during regular business hours.  Please dont hesitate to call and ask to speak to one of the nurses for clinical concerns.  If you have a medical emergency, go to the nearest emergency room or call 911.  A surgeon from [facility] is always on call  at the hospital   [address], [place], [place], [place]  [address] ?  [address], [place], [place]   [phone] ? [phone] ? FAX [phone]  [phone] Web site: [email or web address]

## 2015-02-09 NOTE — Interval H&P Note (Signed)
History and Physical Interval Note:  02/09/2015 8:59 AM  Jonathan Acevedo  has presented today for surgery, with the diagnosis of RIH w/mesh  The various methods of treatment have been discussed with the patient and family. After consideration of risks, benefits and other options for treatment, the patient has consented to  Procedure(s): RIGHT INGUINAL HERNIA REPAIR W/ MESH (Right) INSERTION OF MESH (Right) as a surgical intervention .  The patient's history has been reviewed, patient examined, no change in status, stable for surgery.  I have reviewed the patient's chart and labs.  Questions were answered to the patient's satisfaction.  01/23/15 ED visit noted.  Dr. Martinique feels it is okay to proceed with the operation.   Sydnie Sigmund Lenna Sciara

## 2015-02-09 NOTE — Transfer of Care (Signed)
Immediate Anesthesia Transfer of Care Note  Patient: Jonathan Acevedo  Procedure(s) Performed: Procedure(s) with comments: RIGHT INGUINAL HERNIA REPAIR W/ MESH (Right) - inguinal hernia INSERTION OF MESH to right inguinal hernia (Right)  Patient Location: PACU  Anesthesia Type:GA combined with regional for post-op pain  Level of Consciousness: awake, alert  and oriented  Airway & Oxygen Therapy: Patient Spontanous Breathing and Patient connected to nasal cannula oxygen  Post-op Assessment: Report given to RN and Post -op Vital signs reviewed and stable  Post vital signs: Reviewed and stable  Last Vitals:  Filed Vitals:   02/09/15 0720 02/09/15 1057  BP: 152/80 112/75  Pulse: 56 76  Temp: 36.8 C 36.4 C  Resp: 20 28    Complications: No apparent anesthesia complications

## 2015-02-10 ENCOUNTER — Encounter (HOSPITAL_COMMUNITY): Payer: Self-pay | Admitting: General Surgery

## 2015-02-12 ENCOUNTER — Ambulatory Visit (INDEPENDENT_AMBULATORY_CARE_PROVIDER_SITE_OTHER): Payer: Medicare Other | Admitting: *Deleted

## 2015-02-12 DIAGNOSIS — R55 Syncope and collapse: Secondary | ICD-10-CM | POA: Diagnosis not present

## 2015-02-16 ENCOUNTER — Encounter: Payer: Self-pay | Admitting: Internal Medicine

## 2015-02-16 NOTE — Progress Notes (Signed)
Carelink Summary Report / Loop Recorder 

## 2015-02-17 ENCOUNTER — Telehealth: Payer: Self-pay | Admitting: Cardiology

## 2015-02-17 NOTE — Telephone Encounter (Signed)
LMOVM requesting that pt send manual transmission from home monitor.

## 2015-02-18 NOTE — Telephone Encounter (Signed)
Manual transmission received.  Episodes printed and placed in Dr. Macon Large folder for review.

## 2015-03-01 ENCOUNTER — Telehealth: Payer: Self-pay

## 2015-03-01 NOTE — Telephone Encounter (Signed)
Call to fup regarding AWV; It appears that there are questions regarding meds and other. Called to recommend waiting until Jonathan Acevedo comes in the office on Thursday and will be happy to see Jonathan Acevedo on Thursday after [name]'s ov. To call the practice back in the am with thoughts; made it clear that the apt 1/24 is a nurse apt; not a doctor's apt.

## 2015-03-02 ENCOUNTER — Ambulatory Visit: Payer: Medicare Other

## 2015-03-03 ENCOUNTER — Encounter: Payer: Self-pay | Admitting: Family Medicine

## 2015-03-09 LAB — CUP PACEART REMOTE DEVICE CHECK: MDC IDC SESS DTM: 20161207203528

## 2015-03-10 ENCOUNTER — Encounter: Payer: Self-pay | Admitting: Family Medicine

## 2015-03-16 ENCOUNTER — Ambulatory Visit (INDEPENDENT_AMBULATORY_CARE_PROVIDER_SITE_OTHER): Payer: Medicare Other | Admitting: *Deleted

## 2015-03-16 DIAGNOSIS — R55 Syncope and collapse: Secondary | ICD-10-CM

## 2015-03-16 NOTE — Progress Notes (Signed)
Carelink Summary Report / Loop Recorder 

## 2015-03-26 LAB — CUP PACEART REMOTE DEVICE CHECK: MDC IDC SESS DTM: 20170105050500

## 2015-03-26 NOTE — Progress Notes (Signed)
Carelink summary report received. Battery status OK. Normal device function. No new symptom episodes, tachy episodes, brady, or pause episodes. 1 AF episode, SR with ectopy. Monthly summary reports and ROV/PRN

## 2015-03-30 ENCOUNTER — Telehealth: Payer: Self-pay | Admitting: Cardiology

## 2015-03-30 ENCOUNTER — Telehealth: Payer: Self-pay

## 2015-03-30 MED ORDER — LORAZEPAM 1 MG PO TABS: ORAL_TABLET | ORAL | Status: DC

## 2015-03-30 NOTE — Telephone Encounter (Signed)
Patient called no answer.Left message on personal voice mail. I received a message you needed to speak to me. I was returning your call.

## 2015-03-30 NOTE — Telephone Encounter (Signed)
Ok to prescribe lorazepam 1 mg to take qhs prn. #30. Please express my condolences.  Laurali Goddard Martinique MD, [facility]

## 2015-03-30 NOTE — Telephone Encounter (Signed)
Returned call to patient.Stated he was not doing good emotionally upset.Stated his wife was admitted to [facility] 2 days ago.Stated he is unable to sleep at night and would like a prescription for lorazepam 1 mg.Stated he does not have a PCP.Message sent to [place] for advice.

## 2015-03-30 NOTE — Telephone Encounter (Signed)
Returned call to patient.Dr Martinique prescribed lorazepam.Prescription phoned to pharmacy.Dr.Jordan sends his condolences.

## 2015-03-30 NOTE — Telephone Encounter (Signed)
Returned call to patient no answer.LMTC. 

## 2015-03-30 NOTE — Telephone Encounter (Signed)
Jonathan Acevedo is returning your call . Please call    Thanks

## 2015-03-30 NOTE — Addendum Note (Signed)
Addended by: Kathyrn Lass on: 03/30/2015 02:32 PM   Modules accepted: Orders

## 2015-04-13 ENCOUNTER — Ambulatory Visit (INDEPENDENT_AMBULATORY_CARE_PROVIDER_SITE_OTHER): Payer: Medicare Other | Admitting: *Deleted

## 2015-04-13 DIAGNOSIS — R55 Syncope and collapse: Secondary | ICD-10-CM | POA: Diagnosis not present

## 2015-04-13 LAB — CUP PACEART REMOTE DEVICE CHECK: Date Time Interrogation Session: 20170205210913

## 2015-04-13 NOTE — Progress Notes (Signed)
Carelink summary report received. Battery status OK. Normal device function. No new symptom episodes, tachy episodes, brady, or pause episodes. No new AF episodes. Monthly summary reports and ROV/PRN 

## 2015-04-16 NOTE — Progress Notes (Signed)
Carelink Summary Report / Loop Recorder 

## 2015-04-17 LAB — CUP PACEART REMOTE DEVICE CHECK: MDC IDC SESS DTM: 20170307213659

## 2015-04-17 NOTE — Progress Notes (Signed)
Carelink summary report received. Battery status OK. Normal device function. No new symptom episodes, tachy episodes, brady, or pause episodes. No new AF episodes. Monthly summary reports and ROV/PRN 

## 2015-04-27 ENCOUNTER — Telehealth: Payer: Self-pay | Admitting: Cardiology

## 2015-04-27 NOTE — Telephone Encounter (Signed)
Jonathan Acevedo is wanting to speak to you in reference to the death of his wife . Please call  Thanks

## 2015-04-27 NOTE — Telephone Encounter (Signed)
Received call from patient.He was upset over his wife's recent death.Sympathy was expressed.I will let Dr.Jordan know.Follow up appointment scheduled with Dr.Jordan 07/09/15 at 2:30 pm.

## 2015-04-28 ENCOUNTER — Encounter: Payer: Medicare Other | Admitting: Family

## 2015-04-28 ENCOUNTER — Encounter: Payer: Self-pay | Admitting: Family Medicine

## 2015-05-13 ENCOUNTER — Ambulatory Visit (INDEPENDENT_AMBULATORY_CARE_PROVIDER_SITE_OTHER): Payer: Medicare Other | Admitting: *Deleted

## 2015-05-13 DIAGNOSIS — R55 Syncope and collapse: Secondary | ICD-10-CM | POA: Diagnosis not present

## 2015-05-14 NOTE — Progress Notes (Signed)
Carelink Summary Report / Loop Recorder 

## 2015-06-14 ENCOUNTER — Ambulatory Visit (INDEPENDENT_AMBULATORY_CARE_PROVIDER_SITE_OTHER): Payer: Medicare Other | Admitting: *Deleted

## 2015-06-14 DIAGNOSIS — R55 Syncope and collapse: Secondary | ICD-10-CM

## 2015-06-14 NOTE — Progress Notes (Signed)
Carelink Summary Report / Loop Recorder 

## 2015-06-15 ENCOUNTER — Encounter: Payer: Self-pay | Admitting: Internal Medicine

## 2015-06-27 ENCOUNTER — Encounter: Payer: Self-pay | Admitting: Internal Medicine

## 2015-07-03 LAB — CUP PACEART REMOTE DEVICE CHECK: MDC IDC SESS DTM: 20170406213752

## 2015-07-03 NOTE — Progress Notes (Signed)
Carelink summary report received. Battery status OK. Normal device function. No new symptom episodes, brady, or pause episodes. No new AF episodes. 1 tachy episode - artifact. Monthly summary reports and ROV/PRN

## 2015-07-09 ENCOUNTER — Ambulatory Visit (INDEPENDENT_AMBULATORY_CARE_PROVIDER_SITE_OTHER): Payer: Medicare Other | Admitting: Cardiology

## 2015-07-09 ENCOUNTER — Encounter: Payer: Self-pay | Admitting: Cardiology

## 2015-07-09 VITALS — BP 124/66 | HR 90 | Ht 70.0 in | Wt 191.0 lb

## 2015-07-09 DIAGNOSIS — I5022 Chronic systolic (congestive) heart failure: Secondary | ICD-10-CM

## 2015-07-09 DIAGNOSIS — I350 Nonrheumatic aortic (valve) stenosis: Secondary | ICD-10-CM

## 2015-07-09 DIAGNOSIS — I2510 Atherosclerotic heart disease of native coronary artery without angina pectoris: Secondary | ICD-10-CM

## 2015-07-09 MED ORDER — LORAZEPAM 1 MG PO TABS: ORAL_TABLET | ORAL | Status: DC

## 2015-07-09 NOTE — Progress Notes (Signed)
Jonathan Acevedo Date of Birth: 1928/02/27 Medical Record [id number]  History of Present Illness: Jonathan Acevedo is seen for follow up of CAD and CHF.  He has a history of CAD with remote MI and prior stent to the LAD, HTN, CKD, AS and HLD. In April of 2014 a stress Myoview study showed extensive scar of the anterior apex and inferior wall. Ejection fraction is 44%. This actually represented an improvement from echocardiogram in 2012 at which time his ejection fraction was 35-40%. In Ocober 2014 he presented with a prolonged episode of chest pain. Echo showed no change in his moderate AS - medical management was continued.   He was admitted twice  in April 2015 with syncope that resulted in a fractured right ankle and acute systolic HF. Cardiac cath showed no obstructive disease and mild to moderate AS. He had a loop recorder placed. Syncope felt to be related to Flomax. Unable to take ACEi due to CKD and hypotension.  In July 2015 he was readmitted with acute on chronic CHF. Diuresed with good result. No recurrent syncope.   On follow up today he is very upset. His wife passed in March. He is very angry about the care she received and he is also grieving his loss. I spent 20 minutes with him letting him vent. He states he has no chest pain or SOB. No increased in edema. Weight is stable. He is just overwhelmed with his wife's death. He has difficulty with sleeping. Did use lorazepam for a while but sleeping better without it this week. Has chronic back pain for which he takes hydrocodone.   Current Outpatient Prescriptions  Medication Sig Dispense Refill  . aspirin 325 MG EC tablet Take 325 mg by mouth every morning.     Marland Kitchen atorvastatin (LIPITOR) 40 MG tablet Take 1 tablet by mouth  daily 90 tablet 3  . calcium-vitamin D (OSCAL WITH D) 500-200 MG-UNIT tablet Take 1 tablet by mouth daily with breakfast.    . furosemide (LASIX) 40 MG tablet Take 40 mg in am and 20 mg in pm. May take a extra 40 mg for  weight gain or increase swelling (Patient taking differently: Take 20-40 mg by mouth 2 (two) times daily. Take 40 mg in am and 20 mg in pm. May take a extra 40 mg for weight gain or increase swelling) 180 tablet 3  . LORazepam (ATIVAN) 1 MG tablet Take 1 mg at night if needed for sleep 30 tablet 3  . nitroGLYCERIN (NITROSTAT) 0.4 MG SL tablet Place 1 tablet (0.4 mg total) under the tongue every 5 (five) minutes as needed for chest pain. 25 tablet 3  . oxyCODONE (OXY IR/ROXICODONE) 5 MG immediate release tablet Take 1-2 tablets (5-10 mg total) by mouth every 4 (four) hours as needed for moderate pain, severe pain or breakthrough pain. 40 tablet 0  . potassium chloride SA (K-DUR,KLOR-CON) 20 MEQ tablet Take 1 tablet by mouth  daily 90 tablet 3   No current facility-administered medications for this visit.    Allergies  Allergen Reactions  . Ibandronic Acid Other (See Comments)    Muscle Aches-pt denies  . Risedronate Sodium Other (See Comments)    Muscle aches  . Flomax [Tamsulosin Hcl] Other (See Comments)    Syncope     Past Medical History  Diagnosis Date  . Coronary artery disease     w remote anterior myocardial infarction (1998)  . Hypertension   . Renal insufficiency   .  Aortic stenosis   . Rectal polyp   . Hypercholesteremia   . Kidney stones   . 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 Hypercalcemia 12/26/2011  . Afib ([place])   . Syncope 05/18/2013  . Myocardial infarct, old     3 times   . Wears glasses   . Inguinal hernia     right  . Cataract   . CHF (congestive heart failure) ([place])   . GERD (gastroesophageal reflux disease)   . Arthritis     Past Surgical History  Procedure Laterality Date  . Umbilical hernia repair    . Inguinal hernia repair      left  . Orif femur fracture      right  . Cataract extraction    . Coronary stent placement  07/19/1996    Proximal LAD  . Parathyroidectomy    . Loop recorder implant  05-19-13    MDT LinQ implanted by Dr Lovena Le  for syncope following negative EPS  . Electrophysiology study  05-19-13    EPS for syncope without inducible arrhythmias by Dr Lovena Le  . Electrophysiology study N/A 05/19/2013    Procedure: ELECTROPHYSIOLOGY STUDY;  Surgeon: Evans Lance, MD;  Location: [facility] CATH LAB;  Service: Cardiovascular;  Laterality: N/A;  . Left heart catheterization with coronary angiogram N/A 05/19/2013    Procedure: LEFT HEART CATHETERIZATION WITH CORONARY ANGIOGRAM;  Surgeon: Disney Ruggiero M Martinique, MD;  Location: [facility] CATH LAB;  Service: Cardiovascular;  Laterality: N/A;  . Cardiac catheterization  08/18/2008    LMain 20, LAD stent 20 ISR, CFX 10, RCA 20, EF 45  . Colonoscopy w/ biopsies and polypectomy    . Fracture surgery      right hip  . Inguinal hernia repair Right 02/09/2015    Procedure: RIGHT INGUINAL HERNIA REPAIR W/ MESH;  Surgeon: Jackolyn Confer, MD;  Location: [place];  Service: General;  Laterality: Right;  inguinal hernia  . Insertion of mesh Right 02/09/2015    Procedure: INSERTION OF MESH to right inguinal hernia;  Surgeon: Jackolyn Confer, MD;  Location: [place];  Service: General;  Laterality: Right;    History  Smoking status  . Former Smoker -- 1.00 packs/day for 45 years  . Types: Cigarettes  . Quit date: 07/19/1996  Smokeless tobacco  . Never Used    History  Alcohol Use No    History reviewed. No pertinent family history.  Review of Systems: The review of systems is per the HPI.  All other systems were reviewed and are negative.  Physical Exam: BP 124/66 mmHg  Pulse 90  Ht 5\' 10"  (1.778 m)  Wt 86.637 kg (191 lb)  BMI 27.41 kg/m2 Patient is very pleasant and in no acute distress. Skin is warm and dry. Color is normal.  HEENT is unremarkable. Normocephalic/atraumatic. PERRL. Sclera are nonicteric. Neck is supple. No masses. No JVD. Lungs are clear. Cardiac exam shows a regular rate and rhythm. Harsh 2-3/6 outflow murmur noted. Abdomen is soft. Extremities with trace edema. Multiple venous  varicosities. Gait and ROM are intact. No gross neurologic deficits noted.  Wt Readings from Last 3 Encounters:  07/09/15 86.637 kg (191 lb)  02/09/15 86.637 kg (191 lb)  02/05/15 86.637 kg (191 lb)     LABORATORY DATA:  Lab Results  Component Value Date   WBC 6.3 02/05/2015   HGB 13.2 02/05/2015   HCT 39.1 02/05/2015   MCV 92.7 02/05/2015   PLT 177 02/05/2015     Chemistry  Component Value Date/Time   NA 140 02/05/2015 1250   K 4.4 02/05/2015 1250   CL 104 02/05/2015 1250   CO2 26 02/05/2015 1250   BUN 42* 02/05/2015 1250   CREATININE 1.59* 02/05/2015 1250   CREATININE 1.71* 08/28/2013 1708      Component Value Date/Time   CALCIUM 9.6 02/05/2015 1250   CALCIUM 10.5 08/17/2008 0926   ALKPHOS 91 02/05/2015 1250   AST 18 02/05/2015 1250   ALT 12* 02/05/2015 1250   BILITOT 0.6 02/05/2015 1250        Assessment / Plan:  1. Chronic systolic HF - EF [id number]- he is  well compensated clinically. Continue sodium restriction. Continue current diuretic dose. He is instructed to take an extra lasix for weight gain or increased swelling.   Not a candidate for beta blockers due to bradycardia. Not a candidate for ACEi due to hypotension and CKD.  2. CAD -remote anterior MI and stent of LAD. Last cath April 2015 showed nonobstructive disease. No active symptoms.   3. Syncope - has loop recorder in place x 2 years- no significant arrhythmia. Syncope most likely related to hypotension on Flomax.   4. CKD- stage 3.   5. AS -moderate. No  symptoms. Will update Echo.  6. Situational depression/anxiety due to personal loss of wife. Still grieving. If symptoms persist for several months may want to consider counseling or treatment.

## 2015-07-09 NOTE — Patient Instructions (Signed)
Continue your current therapy  I will see you in 6 months.   

## 2015-07-12 ENCOUNTER — Ambulatory Visit (INDEPENDENT_AMBULATORY_CARE_PROVIDER_SITE_OTHER): Payer: Medicare Other | Admitting: *Deleted

## 2015-07-12 DIAGNOSIS — R55 Syncope and collapse: Secondary | ICD-10-CM | POA: Diagnosis not present

## 2015-07-13 NOTE — Progress Notes (Signed)
Carelink Summary Report / Loop Recorder 

## 2015-07-22 LAB — CUP PACEART REMOTE DEVICE CHECK: Date Time Interrogation Session: 20170506220904

## 2015-08-11 ENCOUNTER — Ambulatory Visit (INDEPENDENT_AMBULATORY_CARE_PROVIDER_SITE_OTHER): Payer: Medicare Other | Admitting: *Deleted

## 2015-08-11 DIAGNOSIS — R55 Syncope and collapse: Secondary | ICD-10-CM

## 2015-08-12 ENCOUNTER — Other Ambulatory Visit: Payer: Self-pay | Admitting: Cardiology

## 2015-08-12 IMAGING — CT CT ANGIO CHEST
2 of 8 series · 18 of 46 positions shown · IV contrast (APPLIED)
Comparison: DG CHEST 2V dated 05/17/2013

CLINICAL DATA: Shortness of breath, evaluate for pulmonary
embolism.

EXAM:
CT ANGIOGRAPHY CHEST WITH CONTRAST
TECHNIQUE: Multidetector CT imaging of the chest was performed using the
standard protocol during bolus administration of intravenous
contrast. Multiplanar CT image reconstructions and MIPs were
obtained to evaluate the vascular anatomy.
CONTRAST:  100mL OMNIPAQUE IOHEXOL 350 MG/ML SOLN

[Series 5: thins · axial · 0.75mm/px · z∈[+294,+538]mm · 15 of 270 slices shown]
[im 13/270  lung]
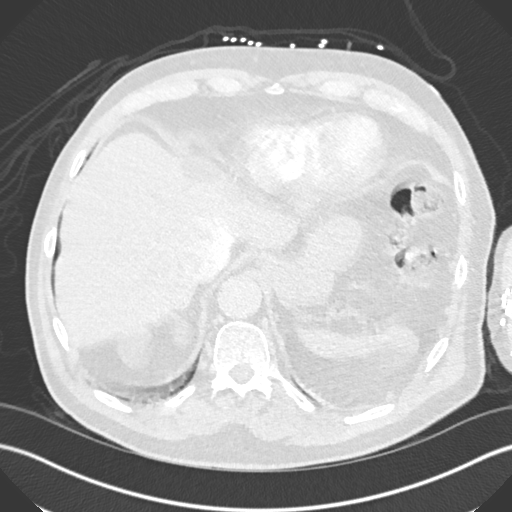
[im 37/270  soft-tissue]
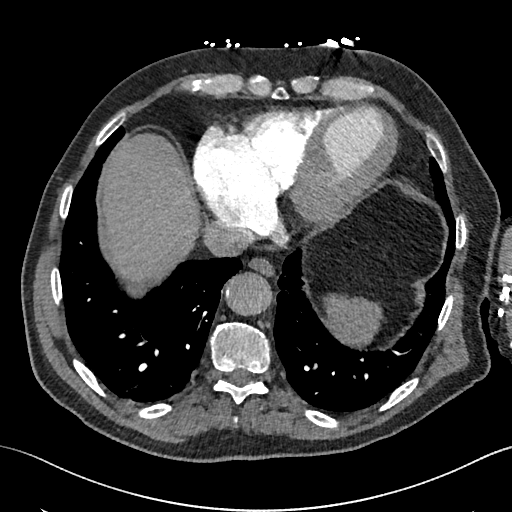
[im 49/270  lung]
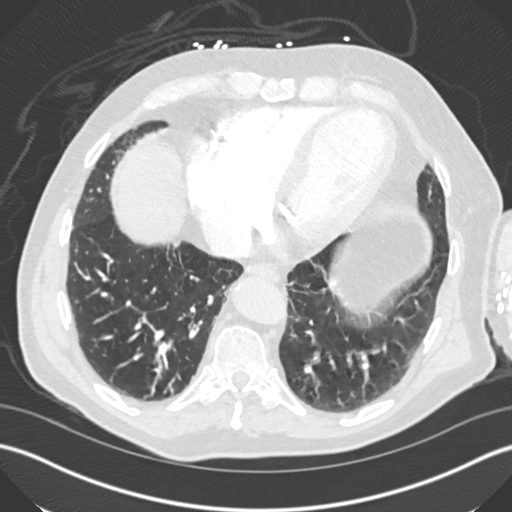
[im 62/270  soft-tissue]
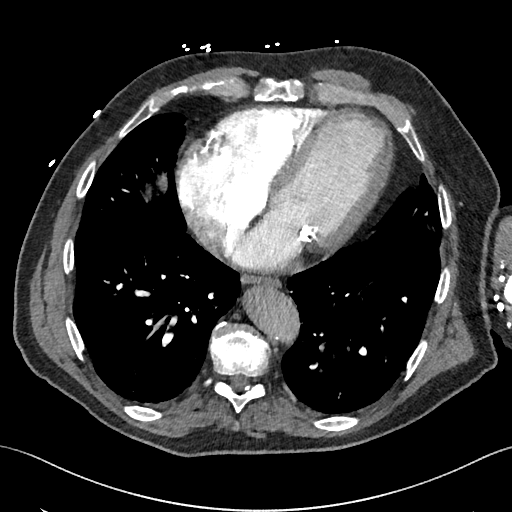
[im 86/270  lung]
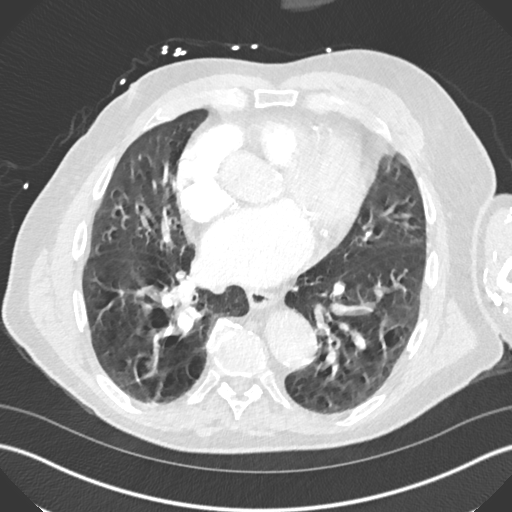
[im 98/270  soft-tissue]
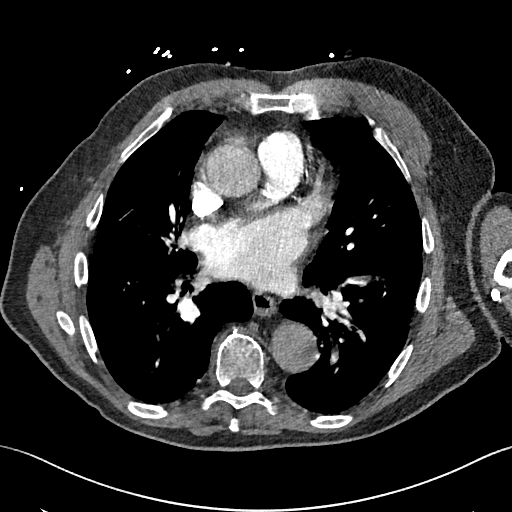
[im 123/270  lung]
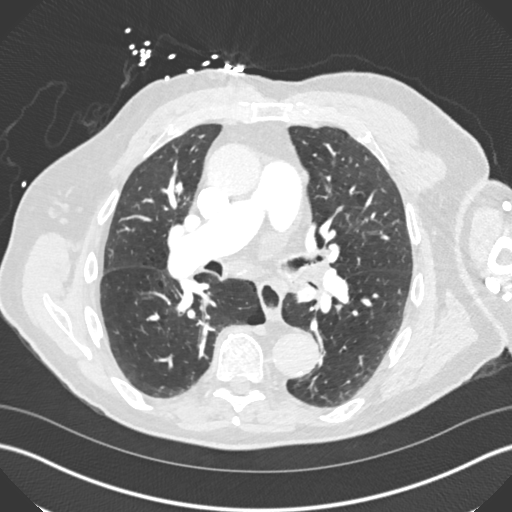
[im 135/270  soft-tissue]
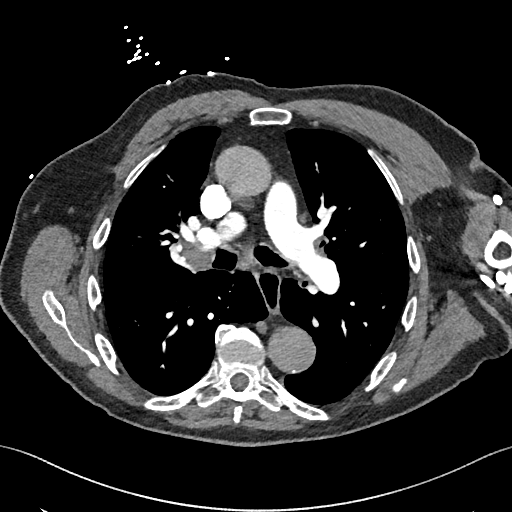
[im 147/270  lung]
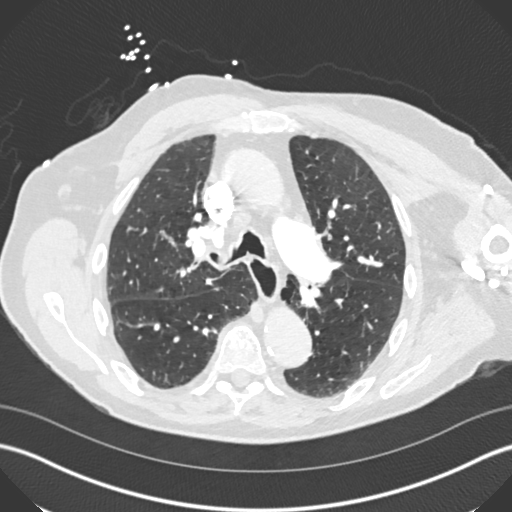
[im 172/270  soft-tissue]
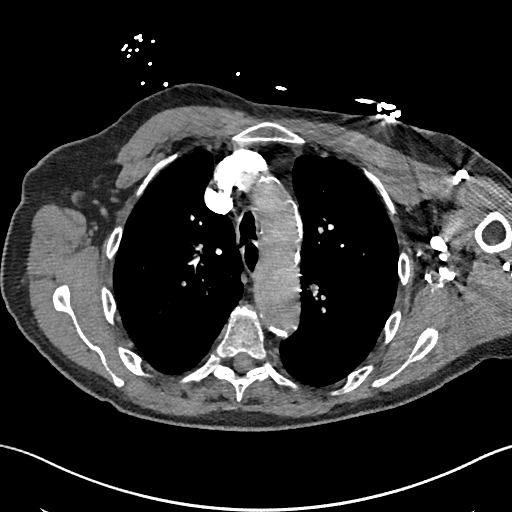
[im 184/270  lung]
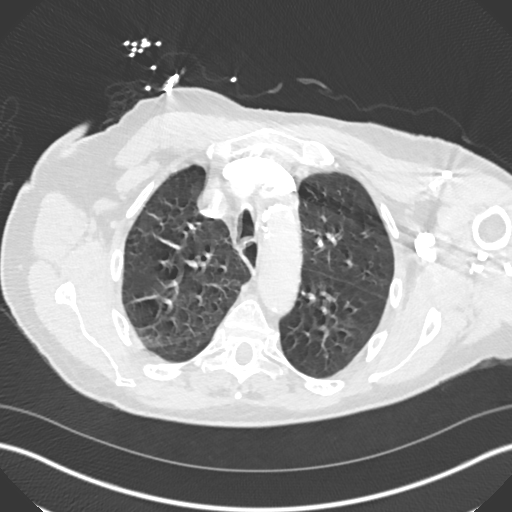
[im 208/270  soft-tissue]
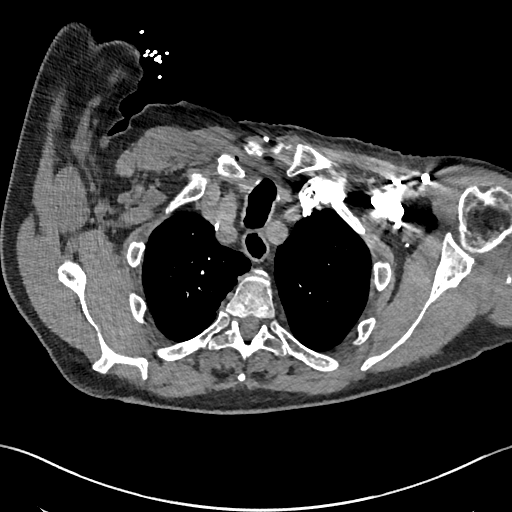
[im 221/270  lung]
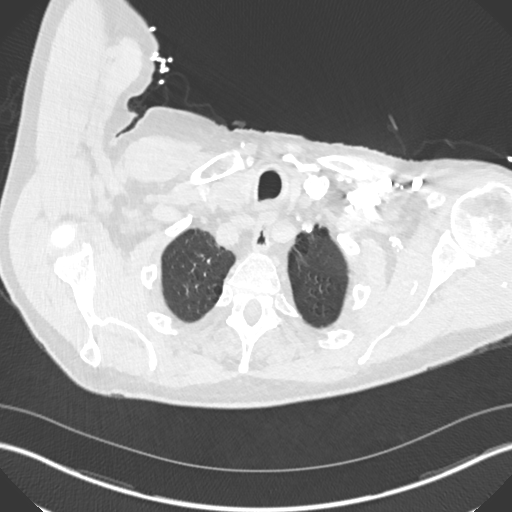
[im 233/270  soft-tissue]
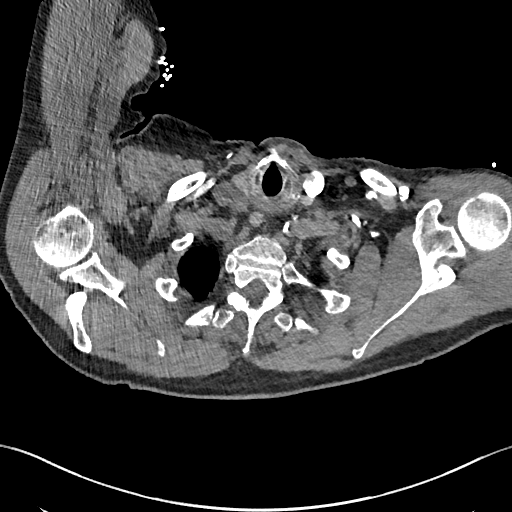
[im 257/270  lung]
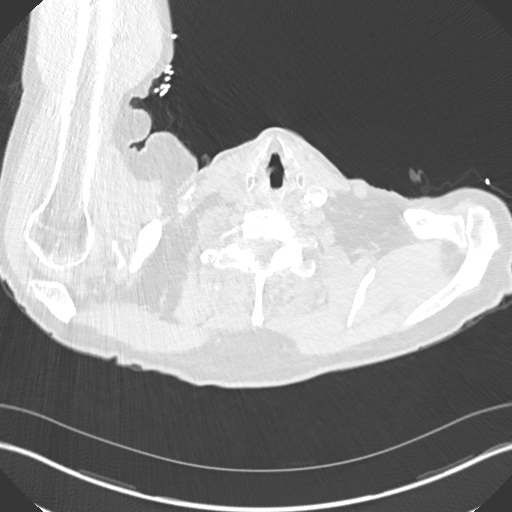

[Series 7: coronal mpr · coronal · 0.54mm/px · 3 of 134 slices shown]
[im 34/134  soft-tissue]
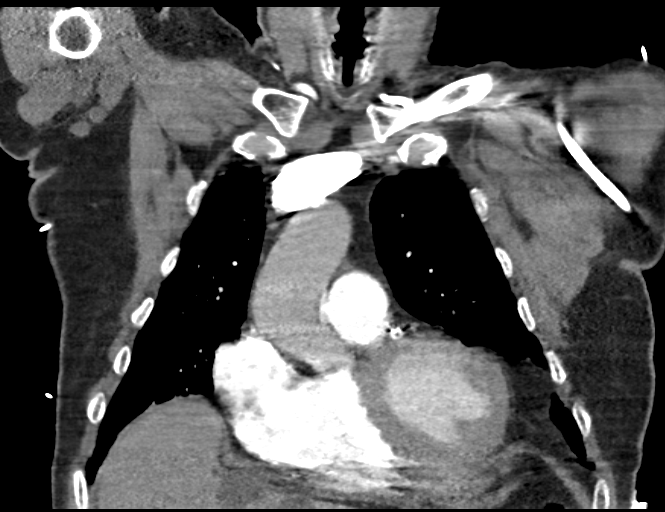
[im 67/134  soft-tissue]
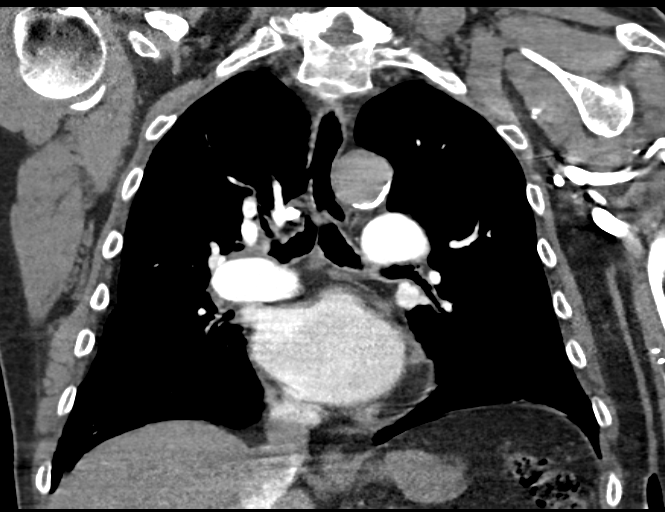
[im 100/134  soft-tissue]
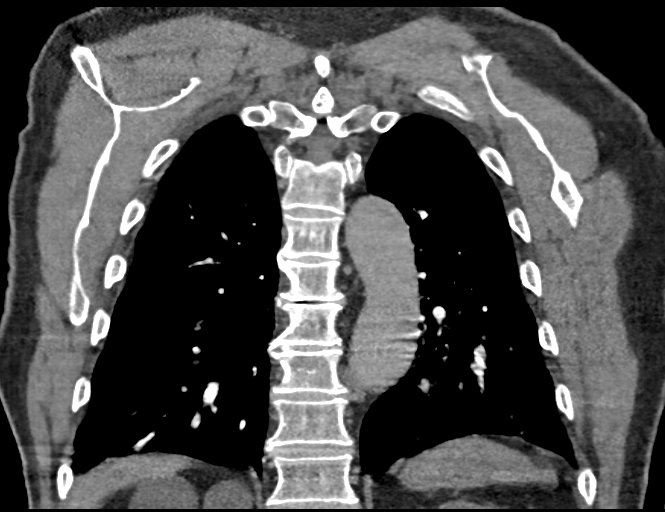

[18 of 46 positions shown; findings below may reference images not displayed]

FINDINGS: Adequate contrast opacification of the pulmonary artery's. Main
pulmonary artery is not enlarged. No pulmonary arterial filling
defects to the level of the subsegmental branches.

The heart is mild-to-moderately enlarged. Three vessel coronary
artery calcifications. Pericardium is unremarkable. No right heart
strain. Thoracic aorta is normal course and caliber, with moderate
to severe calcific atherosclerosis. Low-density sub carinal lymph
node measures up to 14 mm short axis with small right hilar
low-density lymph nodes, to lesser extent on the left. Trace
paraseptal and bullous emphysema at the lung apices though,
evaluation is limited by respiratory motion. No focal consolidations
or pleural effusions. Mild bronchial wall thickening,
tracheobronchial tree is patent. No pneumothorax.

Small air-fluid level in the included view of the esophagus.
Low-density partially imaged cysts in the liver measuring up to
cm (7 Hounsfield units) ; multiple cysts partially imaged in the
kidneys. Visualized soft tissues and included osseous structures are
nonsuspicious; severe degenerative change of the included cervical
spine.

Review of the MIP images confirms the above findings.
IMPRESSION: No acute pulmonary embolism.

Mild to moderate cardiomegaly ; mild bronchial wall thickening could
reflect bronchitis without pneumonia.

Prominent mediastinal lymph nodes could be reactive though, are
nonspecific.

  By: Faustina Gerber

## 2015-08-12 NOTE — Progress Notes (Signed)
Carelink Summary Report / Loop Recorder 

## 2015-08-13 NOTE — Telephone Encounter (Signed)
Rx(s) sent to pharmacy electronically.  

## 2015-08-19 LAB — CUP PACEART REMOTE DEVICE CHECK: MDC IDC SESS DTM: 20170605223702

## 2015-08-24 LAB — CUP PACEART REMOTE DEVICE CHECK: Date Time Interrogation Session: 20170705120144

## 2015-09-10 ENCOUNTER — Ambulatory Visit (INDEPENDENT_AMBULATORY_CARE_PROVIDER_SITE_OTHER): Payer: Medicare Other | Admitting: *Deleted

## 2015-09-10 DIAGNOSIS — R55 Syncope and collapse: Secondary | ICD-10-CM | POA: Diagnosis not present

## 2015-09-13 NOTE — Progress Notes (Signed)
Carelink Summary Report / Loop Recorder 

## 2015-09-17 ENCOUNTER — Encounter (HOSPITAL_COMMUNITY): Payer: Self-pay | Admitting: Emergency Medicine

## 2015-09-17 ENCOUNTER — Emergency Department (HOSPITAL_COMMUNITY)
Admission: EM | Admit: 2015-09-17 | Discharge: 2015-09-17 | Disposition: A | Discharge status: Home / Self Care | Payer: Medicare Other | Attending: Emergency Medicine | Admitting: Emergency Medicine

## 2015-09-17 DIAGNOSIS — Z7982 Long term (current) use of aspirin: Secondary | ICD-10-CM | POA: Insufficient documentation

## 2015-09-17 DIAGNOSIS — Z87891 Personal history of nicotine dependence: Secondary | ICD-10-CM | POA: Diagnosis not present

## 2015-09-17 DIAGNOSIS — Z79899 Other long term (current) drug therapy: Secondary | ICD-10-CM | POA: Diagnosis not present

## 2015-09-17 DIAGNOSIS — L03115 Cellulitis of right lower limb: Secondary | ICD-10-CM

## 2015-09-17 DIAGNOSIS — I130 Hypertensive heart and chronic kidney disease with heart failure and stage 1 through stage 4 chronic kidney disease, or unspecified chronic kidney disease: Secondary | ICD-10-CM | POA: Insufficient documentation

## 2015-09-17 DIAGNOSIS — I5043 Acute on chronic combined systolic (congestive) and diastolic (congestive) heart failure: Secondary | ICD-10-CM | POA: Insufficient documentation

## 2015-09-17 DIAGNOSIS — I2510 Atherosclerotic heart disease of native coronary artery without angina pectoris: Secondary | ICD-10-CM | POA: Insufficient documentation

## 2015-09-17 DIAGNOSIS — N183 Chronic kidney disease, stage 3 (moderate): Secondary | ICD-10-CM | POA: Insufficient documentation

## 2015-09-17 DIAGNOSIS — R531 Weakness: Secondary | ICD-10-CM | POA: Diagnosis present

## 2015-09-17 LAB — URINALYSIS, ROUTINE W REFLEX MICROSCOPIC
BILIRUBIN URINE: NEGATIVE
GLUCOSE, UA: NEGATIVE mg/dL
HGB URINE DIPSTICK: NEGATIVE
Ketones, ur: NEGATIVE mg/dL
Leukocytes, UA: NEGATIVE
Nitrite: NEGATIVE
PH: 6.5 (ref 5.0–8.0)
Protein, ur: NEGATIVE mg/dL
SPECIFIC GRAVITY, URINE: 1.017 (ref 1.005–1.030)

## 2015-09-17 LAB — BASIC METABOLIC PANEL
ANION GAP: 9 (ref 5–15)
BUN: 37 mg/dL — ABNORMAL HIGH (ref 6–20)
CALCIUM: 9.2 mg/dL (ref 8.9–10.3)
CO2: 25 mmol/L (ref 22–32)
Chloride: 106 mmol/L (ref 101–111)
Creatinine, Ser: 1.72 mg/dL — ABNORMAL HIGH (ref 0.61–1.24)
GFR calc Af Amer: 40 mL/min — ABNORMAL LOW (ref >60)
GFR calc non Af Amer: 34 mL/min — ABNORMAL LOW (ref >60)
GLUCOSE: 126 mg/dL — AB (ref 65–99)
Potassium: 3.8 mmol/L (ref 3.5–5.1)
Sodium: 140 mmol/L (ref 135–145)

## 2015-09-17 LAB — CBC
HCT: 38.6 % — ABNORMAL LOW (ref 39.0–52.0)
HEMOGLOBIN: 13 g/dL (ref 13.0–17.0)
MCH: 30.1 pg (ref 26.0–34.0)
MCHC: 33.7 g/dL (ref 30.0–36.0)
MCV: 89.4 fL (ref 78.0–100.0)
Platelets: 161 10*3/uL (ref 150–400)
RBC: 4.32 MIL/uL (ref 4.22–5.81)
RDW: 16.1 % — ABNORMAL HIGH (ref 11.5–15.5)
WBC: 20.9 10*3/uL — ABNORMAL HIGH (ref 4.0–10.5)

## 2015-09-17 LAB — I-STAT CG4 LACTIC ACID, ED: Lactic Acid, Venous: 2.14 mmol/L (ref 0.5–1.9)

## 2015-09-17 MED ORDER — VANCOMYCIN HCL IN DEXTROSE 1-5 GM/200ML-% IV SOLN: 1000.0000 mg | Freq: Once | INTRAVENOUS | Status: DC

## 2015-09-17 MED ORDER — ONDANSETRON 4 MG PO TBDP
4.0000 mg | ORAL_TABLET | Freq: Once | ORAL | Status: DC
  Filled 2015-09-17: qty 1

## 2015-09-17 MED ORDER — AMOXICILLIN-POT CLAVULANATE 875-125 MG PO TABS: 1.0000 | ORAL_TABLET | Freq: Two times a day (BID) | ORAL | 0 refills | Status: DC

## 2015-09-17 MED ORDER — SODIUM CHLORIDE 0.9 % IV BOLUS (SEPSIS): 500.0000 mL | Freq: Once | INTRAVENOUS | Status: DC

## 2015-09-17 MED ORDER — DEXTROSE 5 % IV SOLN
1.0000 g | Freq: Once | INTRAVENOUS | Status: AC
  Administered 2015-09-17: 1 g via INTRAVENOUS
  Filled 2015-09-17: qty 10

## 2015-09-17 MED ORDER — ONDANSETRON 4 MG PO TBDP: 4.0000 mg | ORAL_TABLET | Freq: Three times a day (TID) | ORAL | 0 refills | Status: DC | PRN

## 2015-09-17 MED ORDER — ONDANSETRON HCL 4 MG/2ML IJ SOLN
4.0000 mg | Freq: Once | INTRAMUSCULAR | Status: AC
  Administered 2015-09-17: 4 mg via INTRAVENOUS
  Filled 2015-09-17: qty 2

## 2015-09-17 NOTE — ED Triage Notes (Signed)
Patient sent from [facility] to r/o cellulitis on lower legs and sepsis. Patient was seen there today for bilat lower leg pain, redness, chills.  Patient BP in triage 99/60 and patient states that is his normal for him.  Rates pain 9/10 in legs.

## 2015-09-17 NOTE — Discharge Instructions (Signed)
Return to ER with any worsening symptoms at home including shaking chills, worsening redness, or redness progressing up your leg.  Take a probiotic twice per day at a time different than your taking your antibiotic. Ask your pharmacist for this.

## 2015-09-17 NOTE — ED Provider Notes (Signed)
[facility] DEPT Provider Note   CSN: [id number] Arrival date & time: 09/17/15  1655  First Provider Contact:  None       History   Chief Complaint Chief Complaint  Patient presents with  . Leg Pain  . Chills  . sent from [facility] to r/o cellulits and sepsis  . Weakness    HPI Jonathan Acevedo is a 82 y.o. male.  He presents with pain and redness in his right lower leg. He has a history of chronic venous stasis dermatitis in both legs. He states he felt poorly today called a friend who brought him to his primary care physician and then here.    He initially stated that he had chills earlier today. He states that this was after he got in the shower. He states "that cold shower to ensure felt good". He denies riders. Has not been called her children are nonicteric clothing or blankets today. Has not been lightheaded dizzy or weak. States he just felt poorly in general. No syncope. No chest pain or shortness of breath. Describes right lower leg pain.  HPI  Past Medical History:  Diagnosis Date  . Afib ([place])   . Aortic stenosis   . Arthritis   . Cataract   . CHF (congestive heart failure) ([place])   . Coronary artery disease    w remote anterior myocardial infarction (1998)  . GERD (gastroesophageal reflux disease)   . Hypercalcemia 12/26/2011  . Hypercholesteremia   . Hypertension   . Inguinal hernia    right  . Kidney stones   . Myocardial infarct, old    3 times   . 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 Rectal polyp   . Renal insufficiency   . Syncope 05/18/2013  . Wears glasses     Patient Active Problem List   Diagnosis Date Noted  . Right inguinal hernia 01/06/2015  . Acute on chronic diastolic CHF (congestive heart failure), NYHA class 3 ([place]) 08/12/2013  . Chest pain 08/12/2013  . Acute on chronic systolic congestive heart failure ([place]) 05/25/2013  . Acute on chronic combined systolic and diastolic CHF (congestive heart failure) ([place]) 05/18/2013  . Syncope  05/18/2013  . 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 Chronic venous insufficiency 07/29/2012  . 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 Chronic systolic CHF (congestive heart failure) ([place]) 07/03/2011  . Primary hyperparathyroidism ([place]) 07/03/2011  . Ischemic cardiomyopathy 11/29/2010  . Myocardial infarction ([place])   . Coronary artery disease   . Hypertension   . Chronic renal insufficiency, stage III (moderate)   . Aortic stenosis   . Rectal polyp   . Hypercholesteremia     Past Surgical History:  Procedure Laterality Date  . CARDIAC CATHETERIZATION  08/18/2008   LMain 20, LAD stent 20 ISR, CFX 10, RCA 20, EF 45  . CATARACT EXTRACTION    . COLONOSCOPY W/ BIOPSIES AND POLYPECTOMY    . CORONARY STENT PLACEMENT  07/19/1996   Proximal LAD  . ELECTROPHYSIOLOGY STUDY  05-19-13   EPS for syncope without inducible arrhythmias by Dr Lovena Le  . ELECTROPHYSIOLOGY STUDY N/A 05/19/2013   Procedure: ELECTROPHYSIOLOGY STUDY;  Surgeon: Evans Lance, MD;  Location: [facility] CATH LAB;  Service: Cardiovascular;  Laterality: N/A;  . FRACTURE SURGERY     right hip  . INGUINAL HERNIA REPAIR     left  . INGUINAL HERNIA REPAIR Right 02/09/2015   Procedure: RIGHT INGUINAL HERNIA REPAIR W/ MESH;  Surgeon: Jackolyn Confer, MD;  Location:  [place] OR;  Service: General;  Laterality: Right;  inguinal hernia  . INSERTION OF MESH Right 02/09/2015   Procedure: INSERTION OF MESH to right inguinal hernia;  Surgeon: Jackolyn Confer, MD;  Location: [place];  Service: General;  Laterality: Right;  . LEFT HEART CATHETERIZATION WITH CORONARY ANGIOGRAM N/A 05/19/2013   Procedure: LEFT HEART CATHETERIZATION WITH CORONARY ANGIOGRAM;  Surgeon: Peter M Martinique, MD;  Location: [facility] CATH LAB;  Service: Cardiovascular;  Laterality: N/A;  . LOOP RECORDER IMPLANT  05-19-13   MDT LinQ implanted by Dr Lovena Le for syncope following negative EPS  . ORIF FEMUR FRACTURE     right  . parathyroidectomy    . UMBILICAL  HERNIA REPAIR      OB History    No data available       Home Medications    Prior to Admission medications   Medication Sig Start Date End Date Taking? Authorizing Provider  aspirin 325 MG EC tablet Take 325 mg by mouth every morning.    Yes Historical Provider, MD  atorvastatin (LIPITOR) 40 MG tablet Take 1 tablet by mouth  daily 08/13/15  Yes Peter M Martinique, MD  calcium-vitamin D (OSCAL WITH D) 500-200 MG-UNIT tablet Take 1 tablet by mouth daily with breakfast.   Yes Historical Provider, MD  Cholecalciferol (VITAMIN D) 2000 units CAPS Take 1 capsule by mouth daily.   Yes Historical Provider, MD  clonazePAM (KLONOPIN) 1 MG tablet Take 1 mg by mouth at bedtime.   Yes Historical Provider, MD  furosemide (LASIX) 40 MG tablet Take 40 mg in am and 20 mg in pm. May take a extra 40 mg for weight gain or increase swelling Patient taking differently: Take 20-40 mg by mouth 2 (two) times daily. Take 40 mg in am and 20 mg in pm. May take a extra 40 mg for weight gain or increase swelling 12/18/14  Yes Peter M Martinique, MD  HYDROcodone-acetaminophen (NORCO/VICODIN) 5-325 MG tablet Take 1 tablet by mouth every 6 (six) hours as needed for moderate pain.   Yes Historical Provider, MD  Melatonin 1 MG CAPS Take 2 mg by mouth at bedtime.   Yes Historical Provider, MD  nitroGLYCERIN (NITROSTAT) 0.4 MG SL tablet Place 1 tablet (0.4 mg total) under the tongue every 5 (five) minutes as needed for chest pain. 12/26/12  Yes Peter M Martinique, MD  potassium chloride SA (K-DUR,KLOR-CON) 20 MEQ tablet Take 1 tablet by mouth  daily 08/13/15  Yes Peter M Martinique, MD  amoxicillin-clavulanate (AUGMENTIN) 875-125 MG tablet Take 1 tablet by mouth 2 (two) times daily. 09/17/15   Tanna Furry, MD  LORazepam (ATIVAN) 1 MG tablet Take 1 mg at night if needed for sleep Patient not taking: Reported on 09/17/2015 07/09/15   Peter M Martinique, MD  oxyCODONE (OXY IR/ROXICODONE) 5 MG immediate release tablet Take 1-2 tablets (5-10 mg total) by  mouth every 4 (four) hours as needed for moderate pain, severe pain or breakthrough pain. Patient not taking: Reported on 09/17/2015 02/09/15   Jackolyn Confer, MD    Family History No family history on file.  Social History Social History  Substance Use Topics  . Smoking status: Former Smoker    Packs/day: 1.00    Years: 45.00    Types: Cigarettes    Quit date: 07/19/1996  . Smokeless tobacco: Never Used  . Alcohol use No     Allergies   Ibandronic acid; Risedronate sodium; and Flomax [tamsulosin hcl]   Review of Systems Review of Systems  Constitutional: Negative for appetite change, chills, diaphoresis, fatigue and fever.  HENT: Negative for mouth sores, sore throat and trouble swallowing.   Eyes: Negative for visual disturbance.  Respiratory: Negative for cough, chest tightness, shortness of breath and wheezing.   Cardiovascular: Negative for chest pain.  Gastrointestinal: Negative for abdominal distention, abdominal pain, diarrhea, nausea and vomiting.  Endocrine: Negative for polydipsia, polyphagia and polyuria.  Genitourinary: Negative for dysuria, frequency and hematuria.  Musculoskeletal: Negative for gait problem.  Skin: Positive for color change and rash. Negative for pallor.  Neurological: Negative for dizziness, syncope, light-headedness and headaches.  Hematological: Does not bruise/bleed easily.  Psychiatric/Behavioral: Negative for behavioral problems and confusion.     Physical Exam Updated Vital Signs BP 99/60 (BP Location: Right Arm)   Pulse (!) 57   Temp 98.5 F (36.9 C) (Oral)   Resp 18   Ht 5\' 10"  (1.778 m)   Wt 186 lb (84.4 kg)   SpO2 96%   BMI 26.69 kg/m   Physical Exam  Constitutional: He is oriented to person, place, and time. He appears well-developed and well-nourished. No distress.  HENT:  Head: Normocephalic.  Eyes: Conjunctivae are normal. Pupils are equal, round, and reactive to light. No scleral icterus.  Neck: Normal range of  motion. Neck supple. No thyromegaly present.  Cardiovascular: Normal rate and regular rhythm.  Exam reveals no gallop and no friction rub.   No murmur heard. Pulmonary/Chest: Effort normal and breath sounds normal. No respiratory distress. He has no wheezes. He has no rales.  Abdominal: Soft. Bowel sounds are normal. He exhibits no distension. There is no tenderness. There is no rebound.  Musculoskeletal: Normal range of motion.  Neurological: He is alert and oriented to person, place, and time.  Skin: Skin is warm and dry. No rash noted.  Venous stasis changes bilateral caries. Erythema and cellulitis demarcated the right lower leg distal to about mid shin.  Psychiatric: He has a normal mood and affect. His behavior is normal.     ED Treatments / Results  Labs (all labs ordered are listed, but only abnormal results are displayed) Labs Reviewed  BASIC METABOLIC PANEL - Abnormal; Notable for the following:       Result Value   Glucose, Bld 126 (*)    BUN 37 (*)    Creatinine, Ser 1.72 (*)    GFR calc non Af Amer 34 (*)    GFR calc Af Amer 40 (*)    All other components within normal limits  CBC - Abnormal; Notable for the following:    WBC 20.9 (*)    HCT 38.6 (*)    RDW 16.1 (*)    All other components within normal limits  I-STAT CG4 LACTIC ACID, ED - Abnormal; Notable for the following:    Lactic Acid, Venous 2.14 (*)    All other components within normal limits  URINALYSIS, ROUTINE W REFLEX MICROSCOPIC (NOT AT [facility])    EKG  EKG Interpretation None       Radiology No results found.  Procedures Procedures (including critical care time)  Medications Ordered in ED Medications  sodium chloride 0.9 % bolus 500 mL (500 mLs Intravenous Restarted 09/17/15 1948)  cefTRIAXone (ROCEPHIN) 1 g in dextrose 5 % 50 mL IVPB (not administered)     Initial Impression / Assessment and Plan / ED Course  I have reviewed the triage vital signs and the nursing notes.  Pertinent  labs & imaging results that were available during my care  of the patient were reviewed by me and considered in my medical decision making (see chart for details).  Clinical Course    Patient has a lactate of 2.14. Is not tachycardic. He is not hypotensive. Appears quite nontoxic. He is awake alert and states he "feels fine". Hasn't clinically obvious cellulitis. Does have leukocytosis. We discussed admission versus home treatment. He is adamant that he does not want to stay in the hospital. I do not think this is unreasonable. His given IV Rocephin. Description for Augmentin. He will return with any worsening symptoms over the weekend. Essence his primary care physician on Monday if not markedly improving.  Final Clinical Impressions(s) / ED Diagnoses   Final diagnoses:  Cellulitis of right lower extremity    New Prescriptions New Prescriptions   AMOXICILLIN-CLAVULANATE (AUGMENTIN) 875-125 MG TABLET    Take 1 tablet by mouth 2 (two) times daily.     Tanna Furry, MD 09/17/15 2045

## 2015-09-17 NOTE — ED Notes (Signed)
Patient d/c'd self care with family member.  F/U and medications reviewed.  Patient and family member verbalized understanding.

## 2015-09-17 NOTE — ED Provider Notes (Signed)
[facility] DEPT Provider Note   CSN: [id number] Arrival date & time: 09/17/15  1655  First Provider Contact: [id number] p.m. 09/16/68       History   Chief Complaint Chief Complaint  Patient presents with  . Leg Pain  . Chills  . sent from [facility] to r/o cellulits and sepsis  . Weakness    HPI Jonathan Acevedo is a 82 y.o. male. Patient seen and evaluated his primary care physician today and had a red spot on his lower leg. Chronic venous stasis. However became reddened and painful to him yesterday. Sent by his primary care physician here. He feels well. No fever shakes chills. States his blood pressure "always runs tabs low". 99/60 triage. Complains of pain in the right leg.  HPI  Past Medical History:  Diagnosis Date  . Afib ([place])   . Aortic stenosis   . Arthritis   . Cataract   . CHF (congestive heart failure) ([place])   . Coronary artery disease    w remote anterior myocardial infarction (1998)  . GERD (gastroesophageal reflux disease)   . Hypercalcemia 12/26/2011  . Hypercholesteremia   . Hypertension   . Inguinal hernia    right  . Kidney stones   . Myocardial infarct, old    3 times   . 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 Rectal polyp   . Renal insufficiency   . Syncope 05/18/2013  . Wears glasses     Patient Active Problem List   Diagnosis Date Noted  . Right inguinal hernia 01/06/2015  . Acute on chronic diastolic CHF (congestive heart failure), NYHA class 3 ([place]) 08/12/2013  . Chest pain 08/12/2013  . Acute on chronic systolic congestive heart failure ([place]) 05/25/2013  . Acute on chronic combined systolic and diastolic CHF (congestive heart failure) ([place]) 05/18/2013  . Syncope 05/18/2013  . 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 Chronic venous insufficiency 07/29/2012  . 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 Chronic systolic CHF (congestive heart failure) ([place]) 07/03/2011  . Primary hyperparathyroidism ([place]) 07/03/2011  .  Ischemic cardiomyopathy 11/29/2010  . Myocardial infarction ([place])   . Coronary artery disease   . Hypertension   . Chronic renal insufficiency, stage III (moderate)   . Aortic stenosis   . Rectal polyp   . Hypercholesteremia     Past Surgical History:  Procedure Laterality Date  . CARDIAC CATHETERIZATION  08/18/2008   LMain 20, LAD stent 20 ISR, CFX 10, RCA 20, EF 45  . CATARACT EXTRACTION    . COLONOSCOPY W/ BIOPSIES AND POLYPECTOMY    . CORONARY STENT PLACEMENT  07/19/1996   Proximal LAD  . ELECTROPHYSIOLOGY STUDY  05-19-13   EPS for syncope without inducible arrhythmias by Dr Lovena Le  . ELECTROPHYSIOLOGY STUDY N/A 05/19/2013   Procedure: ELECTROPHYSIOLOGY STUDY;  Surgeon: Evans Lance, MD;  Location: [facility] CATH LAB;  Service: Cardiovascular;  Laterality: N/A;  . FRACTURE SURGERY     right hip  . INGUINAL HERNIA REPAIR     left  . INGUINAL HERNIA REPAIR Right 02/09/2015   Procedure: RIGHT INGUINAL HERNIA REPAIR W/ MESH;  Surgeon: Jackolyn Confer, MD;  Location: [facility];  Service: General;  Laterality: Right;  inguinal hernia  . INSERTION OF MESH Right 02/09/2015   Procedure: INSERTION OF MESH to right inguinal hernia;  Surgeon: Jackolyn Confer, MD;  Location: [facility];  Service: General;  Laterality: Right;  . LEFT HEART CATHETERIZATION WITH CORONARY ANGIOGRAM N/A 05/19/2013   Procedure: LEFT HEART  CATHETERIZATION WITH CORONARY ANGIOGRAM;  Surgeon: Peter M Martinique, MD;  Location: [facility] CATH LAB;  Service: Cardiovascular;  Laterality: N/A;  . LOOP RECORDER IMPLANT  05-19-13   MDT LinQ implanted by Dr Lovena Le for syncope following negative EPS  . ORIF FEMUR FRACTURE     right  . parathyroidectomy    . UMBILICAL HERNIA REPAIR      OB History    No data available       Home Medications    Prior to Admission medications   Medication Sig Start Date End Date Taking? Authorizing Provider  aspirin 325 MG EC tablet Take 325 mg by mouth every morning.    Yes Historical Provider, MD    atorvastatin (LIPITOR) 40 MG tablet Take 1 tablet by mouth  daily 08/13/15  Yes Peter M Martinique, MD  calcium-vitamin D (OSCAL WITH D) 500-200 MG-UNIT tablet Take 1 tablet by mouth daily with breakfast.   Yes Historical Provider, MD  Cholecalciferol (VITAMIN D) 2000 units CAPS Take 1 capsule by mouth daily.   Yes Historical Provider, MD  clonazePAM (KLONOPIN) 1 MG tablet Take 1 mg by mouth at bedtime.   Yes Historical Provider, MD  furosemide (LASIX) 40 MG tablet Take 40 mg in am and 20 mg in pm. May take a extra 40 mg for weight gain or increase swelling Patient taking differently: Take 20-40 mg by mouth 2 (two) times daily. Take 40 mg in am and 20 mg in pm. May take a extra 40 mg for weight gain or increase swelling 12/18/14  Yes Peter M Martinique, MD  HYDROcodone-acetaminophen (NORCO/VICODIN) 5-325 MG tablet Take 1 tablet by mouth every 6 (six) hours as needed for moderate pain.   Yes Historical Provider, MD  Melatonin 1 MG CAPS Take 2 mg by mouth at bedtime.   Yes Historical Provider, MD  nitroGLYCERIN (NITROSTAT) 0.4 MG SL tablet Place 1 tablet (0.4 mg total) under the tongue every 5 (five) minutes as needed for chest pain. 12/26/12  Yes Peter M Martinique, MD  potassium chloride SA (K-DUR,KLOR-CON) 20 MEQ tablet Take 1 tablet by mouth  daily 08/13/15  Yes Peter M Martinique, MD  amoxicillin-clavulanate (AUGMENTIN) 875-125 MG tablet Take 1 tablet by mouth 2 (two) times daily. 09/17/15   Tanna Furry, MD  LORazepam (ATIVAN) 1 MG tablet Take 1 mg at night if needed for sleep Patient not taking: Reported on 09/17/2015 07/09/15   Peter M Martinique, MD  oxyCODONE (OXY IR/ROXICODONE) 5 MG immediate release tablet Take 1-2 tablets (5-10 mg total) by mouth every 4 (four) hours as needed for moderate pain, severe pain or breakthrough pain. Patient not taking: Reported on 09/17/2015 02/09/15   Jackolyn Confer, MD    Family History No family history on file.  Social History Social History  Substance Use Topics  . Smoking  status: Former Smoker    Packs/day: 1.00    Years: 45.00    Types: Cigarettes    Quit date: 07/19/1996  . Smokeless tobacco: Never Used  . Alcohol use No     Allergies   Ibandronic acid; Risedronate sodium; and Flomax [tamsulosin hcl]   Review of Systems Review of Systems  Constitutional: Negative for appetite change, chills, diaphoresis, fatigue and fever.  HENT: Negative for mouth sores, sore throat and trouble swallowing.   Eyes: Negative for visual disturbance.  Respiratory: Negative for cough, chest tightness, shortness of breath and wheezing.   Cardiovascular: Negative for chest pain.  Gastrointestinal: Negative for abdominal distention, abdominal pain, diarrhea,  nausea and vomiting.  Endocrine: Negative for polydipsia, polyphagia and polyuria.  Genitourinary: Negative for dysuria, frequency and hematuria.  Musculoskeletal: Negative for gait problem.  Skin: Positive for color change. Negative for pallor.  Neurological: Negative for dizziness, syncope, light-headedness and headaches.  Hematological: Does not bruise/bleed easily.  Psychiatric/Behavioral: Negative for behavioral problems and confusion.     Physical Exam Updated Vital Signs BP 99/60 (BP Location: Right Arm)   Pulse (!) 57   Temp 98.5 F (36.9 C) (Oral)   Resp 18   Ht 5\' 10"  (1.778 m)   Wt 186 lb (84.4 kg)   SpO2 96%   BMI 26.69 kg/m   Physical Exam  Constitutional: He is oriented to person, place, and time. He appears well-developed and well-nourished. No distress.  HENT:  Head: Normocephalic.  Eyes: Conjunctivae are normal. Pupils are equal, round, and reactive to light. No scleral icterus.  Neck: Normal range of motion. Neck supple. No thyromegaly present.  Cardiovascular: Normal rate and regular rhythm.  Exam reveals no gallop and no friction rub.   No murmur heard. Pulmonary/Chest: Effort normal and breath sounds normal. No respiratory distress. He has no wheezes. He has no rales.    Abdominal: Soft. Bowel sounds are normal. He exhibits no distension. There is no tenderness. There is no rebound.  Musculoskeletal: Normal range of motion.  Neurological: He is alert and oriented to person, place, and time.  Skin: Skin is warm and dry. No rash noted.  Venous stasis dermatitis bilateral lower extremity. Erythema the right lower leg to the mid shin. No striations. Overall patient appears well he is awake and alert conversant lucid oriented.  Psychiatric: He has a normal mood and affect. His behavior is normal.     ED Treatments / Results  Labs (all labs ordered are listed, but only abnormal results are displayed) Labs Reviewed  BASIC METABOLIC PANEL - Abnormal; Notable for the following:       Result Value   Glucose, Bld 126 (*)    BUN 37 (*)    Creatinine, Ser 1.72 (*)    GFR calc non Af Amer 34 (*)    GFR calc Af Amer 40 (*)    All other components within normal limits  CBC - Abnormal; Notable for the following:    WBC 20.9 (*)    HCT 38.6 (*)    RDW 16.1 (*)    All other components within normal limits  I-STAT CG4 LACTIC ACID, ED - Abnormal; Notable for the following:    Lactic Acid, Venous 2.14 (*)    All other components within normal limits  URINALYSIS, ROUTINE W REFLEX MICROSCOPIC (NOT AT [facility])    EKG  EKG Interpretation None       Radiology No results found.  Procedures Procedures (including critical care time)  Medications Ordered in ED Medications  sodium chloride 0.9 % bolus 500 mL (500 mLs Intravenous Restarted 09/17/15 1948)  cefTRIAXone (ROCEPHIN) 1 g in dextrose 5 % 50 mL IVPB (not administered)     Initial Impression / Assessment and Plan / ED Course  I have reviewed the triage vital signs and the nursing notes.  Pertinent labs & imaging results that were available during my care of the patient were reviewed by me and considered in my medical decision making (see chart for details).  Clinical Course      Final Clinical  Impressions(s) / ED Diagnoses   Final diagnoses:  Cellulitis of right lower extremity    New  Prescriptions New Prescriptions   AMOXICILLIN-CLAVULANATE (AUGMENTIN) 875-125 MG TABLET    Take 1 tablet by mouth 2 (two) times daily.     Tanna Furry, MD 09/21/15 1118

## 2015-09-17 NOTE — ED Notes (Signed)
MD aware of the request to speak to family prior to d/c.

## 2015-09-20 LAB — CUP PACEART REMOTE DEVICE CHECK: Date Time Interrogation Session: 20170804122603

## 2015-10-08 ENCOUNTER — Encounter: Payer: Self-pay | Admitting: Internal Medicine

## 2015-10-12 ENCOUNTER — Ambulatory Visit (INDEPENDENT_AMBULATORY_CARE_PROVIDER_SITE_OTHER): Payer: Medicare Other | Admitting: *Deleted

## 2015-10-12 DIAGNOSIS — R55 Syncope and collapse: Secondary | ICD-10-CM | POA: Diagnosis not present

## 2015-10-12 NOTE — Progress Notes (Signed)
Carelink Summary Report / Loop Recorder 

## 2015-11-09 ENCOUNTER — Ambulatory Visit (INDEPENDENT_AMBULATORY_CARE_PROVIDER_SITE_OTHER): Payer: Medicare Other | Admitting: *Deleted

## 2015-11-09 DIAGNOSIS — R55 Syncope and collapse: Secondary | ICD-10-CM | POA: Diagnosis not present

## 2015-11-09 LAB — CUP PACEART REMOTE DEVICE CHECK: MDC IDC SESS DTM: 20170903233628

## 2015-11-09 NOTE — Progress Notes (Signed)
Carelink summary report received. Battery status OK. Normal device function. No new symptom episodes, brady, or pause episodes. No new AF episodes. 1 tachy- ECG previously addressed. Monthly summary reports and ROV/PRN

## 2015-11-10 NOTE — Progress Notes (Signed)
Carelink Summary Report / Loop Recorder 

## 2015-11-15 ENCOUNTER — Telehealth: Payer: Self-pay

## 2015-11-15 NOTE — Telephone Encounter (Signed)
Patient called no answer.Left message on personal voice mail 6 month follow up with Dr.Jordan Tues 01/25/16 at 11:30 am.Advised to call me back if time is not good for you.

## 2015-12-04 ENCOUNTER — Other Ambulatory Visit: Payer: Self-pay | Admitting: Cardiology

## 2015-12-09 ENCOUNTER — Ambulatory Visit (INDEPENDENT_AMBULATORY_CARE_PROVIDER_SITE_OTHER): Payer: Medicare Other | Admitting: *Deleted

## 2015-12-09 DIAGNOSIS — R55 Syncope and collapse: Secondary | ICD-10-CM | POA: Diagnosis not present

## 2015-12-10 NOTE — Progress Notes (Signed)
Carelink Summary Report / Loop Recorder 

## 2015-12-18 LAB — CUP PACEART REMOTE DEVICE CHECK
Implantable Pulse Generator Implant Date: 20150413
MDC IDC SESS DTM: 20171004001057

## 2015-12-18 NOTE — Progress Notes (Signed)
Carelink summary report received. Battery status OK. Normal device function. No new symptom episodes, tachy episodes, brady, or pause episodes. No new AF episodes. Monthly summary reports and ROV/PRN 

## 2016-01-10 ENCOUNTER — Ambulatory Visit (INDEPENDENT_AMBULATORY_CARE_PROVIDER_SITE_OTHER): Payer: Medicare Other | Admitting: *Deleted

## 2016-01-10 DIAGNOSIS — R55 Syncope and collapse: Secondary | ICD-10-CM | POA: Diagnosis not present

## 2016-01-10 NOTE — Progress Notes (Signed)
Carelink Summary Report / Loop Recorder 

## 2016-01-14 ENCOUNTER — Telehealth: Payer: Self-pay | Admitting: Cardiology

## 2016-01-14 NOTE — Telephone Encounter (Signed)
Spoke with pt states that he is having teeth extracted on Thursday and they gave him amoxicillin 500mg  4 times daily and he is to start Monday for 7 days. Are there any interactions for pt current medication? Please advise

## 2016-01-14 NOTE — Telephone Encounter (Signed)
No interaction between amoxicillin and pt's other meds. Dr Martinique to provide clearance for holding aspirin.

## 2016-01-14 NOTE — Telephone Encounter (Signed)
He does not need to hold ASA for dental extraction.  Analyah Mcconnon Martinique MD, [facility]

## 2016-01-14 NOTE — Telephone Encounter (Signed)
Going to the dentist thursday needs a clearance  Request for surgical clearance:  1. What type of surgery is being performed? 3 or 4 teeth taking out starting Monday they want him to start taking Moaxilon 4 times a day   2. When is this surgery scheduled?Dec 14 @10 :30AM  3. Are there any medications that need to be held prior to surgery and how long?aspin stop taking them starting Monday   4. Name of physician performing surgery?Dr.lemon   5. What is your office phone and fax number? [phone]

## 2016-01-14 NOTE — Telephone Encounter (Signed)
Returned call to patient no answer.Left Dr.Jordan's recommendations on personal voice mail. 

## 2016-01-15 LAB — CUP PACEART REMOTE DEVICE CHECK
Date Time Interrogation Session: 20171103003743
MDC IDC PG IMPLANT DT: 20150413

## 2016-01-15 NOTE — Progress Notes (Signed)
Carelink summary report received. Battery status OK. Normal device function. No new symptom episodes, tachy episodes, brady, or pause episodes. No new AF episodes. Monthly summary reports and ROV/PRN 

## 2016-01-17 NOTE — Telephone Encounter (Signed)
Faxed via Epic to Dr Koren Bound

## 2016-01-23 NOTE — Progress Notes (Signed)
[name] Date of Birth: Aug 14, 1928 Medical Record [id number]  History of Present Illness: [name] is seen for follow up of CAD and CHF.  He has a history of CAD with remote MI and prior stent to the LAD, HTN, CKD, AS and HLD. In April of 2014 a stress Myoview study showed extensive scar of the anterior apex and inferior wall. Ejection fraction is 44%. This actually represented an improvement from echocardiogram in 2012 at which time his ejection fraction was 35-40%. In Ocober 2014 he presented with a prolonged episode of chest pain. Echo showed no change in his moderate AS - medical management was continued.   He was admitted twice  in April 2015 with syncope that resulted in a fractured right ankle and acute systolic HF. Cardiac cath showed no obstructive disease and mild to moderate AS. He had a loop recorder placed. Syncope felt to be related to Flomax. Unable to take ACEi due to CKD and hypotension.  In July 2015 he was readmitted with acute on chronic CHF. Diuresed with good result. No recurrent syncope.   Echo November 2016 showed moderate AS with mean gradient 17 mm Hg. EF 30%.   On follow up today he is doing OK. Still upset about his wife's passing earlier this year. He denies any chest pain or SOB. No increased in edema. Weight is stable. No dizziness or syncope.  He is planning on getting back in his exercise routine after the holidays with swimming.   Current Outpatient Prescriptions  Medication Sig Dispense Refill  . amoxicillin-clavulanate (AUGMENTIN) 875-125 MG tablet Take 1 tablet by mouth 2 (two) times daily. 14 tablet 0  . aspirin 325 MG EC tablet Take 325 mg by mouth every morning.     Marland Kitchen atorvastatin (LIPITOR) 40 MG tablet Take 1 tablet by mouth  daily 90 tablet 3  . calcium-vitamin D (OSCAL WITH D) 500-200 MG-UNIT tablet Take 1 tablet by mouth daily with breakfast.    . Cholecalciferol (VITAMIN D) 2000 units CAPS Take 1 capsule by mouth daily.    . clonazePAM  (KLONOPIN) 1 MG tablet Take 1 mg by mouth at bedtime.    . furosemide (LASIX) 40 MG tablet TAKE 1 TABLET BY MOUTH IN  THE MORNING AND 1/2 TABLET  IN THE EVENING MAY TAKE ONE MORE TABLET FOR WEIGHT GAIN OR INCREASE SWELLING 180 tablet 1  . HYDROcodone-acetaminophen (NORCO/VICODIN) 5-325 MG tablet Take 1 tablet by mouth every 6 (six) hours as needed for moderate pain.    Marland Kitchen LORazepam (ATIVAN) 1 MG tablet Take 1 mg at night if needed for sleep 30 tablet 3  . Melatonin 1 MG CAPS Take 2 mg by mouth at bedtime.    . nitroGLYCERIN (NITROSTAT) 0.4 MG SL tablet Place 1 tablet (0.4 mg total) under the tongue every 5 (five) minutes as needed for chest pain. 25 tablet 3  . potassium chloride SA (K-DUR,KLOR-CON) 20 MEQ tablet Take 1 tablet by mouth  daily 90 tablet 3   No current facility-administered medications for this visit.     Allergies  Allergen Reactions  . Ibandronic Acid Other (See Comments)    Muscle Aches-pt denies  . Risedronate Sodium Other (See Comments)    Muscle aches  . Flomax [Tamsulosin Hcl] Other (See Comments)    Syncope     Past Medical History:  Diagnosis Date  . Afib ([place])   . Aortic stenosis   . Arthritis   . Cataract   . CHF (congestive  heart failure) ([place])   . Coronary artery disease    w remote anterior myocardial infarction (1998)  . GERD (gastroesophageal reflux disease)   . Hypercalcemia 12/26/2011  . Hypercholesteremia   . Hypertension   . Inguinal hernia    right  . Kidney stones   . Myocardial infarct, old    3 times   . 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 Rectal polyp   . Renal insufficiency   . Syncope 05/18/2013  . Wears glasses     Past Surgical History:  Procedure Laterality Date  . CARDIAC CATHETERIZATION  08/18/2008   LMain 20, LAD stent 20 ISR, CFX 10, RCA 20, EF 45  . CATARACT EXTRACTION    . COLONOSCOPY W/ BIOPSIES AND POLYPECTOMY    . CORONARY STENT PLACEMENT  07/19/1996   Proximal LAD  . ELECTROPHYSIOLOGY STUDY  05-19-13   EPS for  syncope without inducible arrhythmias by Dr Lovena Le  . ELECTROPHYSIOLOGY STUDY N/A 05/19/2013   Procedure: ELECTROPHYSIOLOGY STUDY;  Surgeon: Evans Lance, MD;  Location: [facility] CATH LAB;  Service: Cardiovascular;  Laterality: N/A;  . FRACTURE SURGERY     right hip  . INGUINAL HERNIA REPAIR     left  . INGUINAL HERNIA REPAIR Right 02/09/2015   Procedure: RIGHT INGUINAL HERNIA REPAIR W/ MESH;  Surgeon: Jackolyn Confer, MD;  Location: [place] [facility];  Service: General;  Laterality: Right;  inguinal hernia  . INSERTION OF MESH Right 02/09/2015   Procedure: INSERTION OF MESH to right inguinal hernia;  Surgeon: Jackolyn Confer, MD;  Location: [facility];  Service: General;  Laterality: Right;  . LEFT HEART CATHETERIZATION WITH CORONARY ANGIOGRAM N/A 05/19/2013   Procedure: LEFT HEART CATHETERIZATION WITH CORONARY ANGIOGRAM;  Surgeon: Gaile Allmon M Martinique, MD;  Location: [facility] CATH LAB;  Service: Cardiovascular;  Laterality: N/A;  . LOOP RECORDER IMPLANT  05-19-13   MDT LinQ implanted by Dr Lovena Le for syncope following negative EPS  . ORIF FEMUR FRACTURE     right  . parathyroidectomy    . UMBILICAL HERNIA REPAIR      History  Smoking Status  . Former Smoker  . Packs/day: 1.00  . Years: 45.00  . Types: Cigarettes  . Quit date: 07/19/1996  Smokeless Tobacco  . Never Used    History  Alcohol Use No    No family history on file.  Review of Systems: The review of systems is per the HPI.  All other systems were reviewed and are negative.  Physical Exam: BP 140/82 (BP Location: Right Arm, Patient Position: Sitting, Cuff Size: Normal)   Pulse (!) 57   Ht 5\' 10"  (1.778 m)   Wt 194 lb 9.6 oz (88.3 kg)   BMI 27.92 kg/m  Patient is very pleasant and in no acute distress. Skin is warm and dry. Color is normal.  HEENT is unremarkable. Normocephalic/atraumatic. PERRL. Sclera are nonicteric. Neck is supple. No masses. No JVD. Lungs are clear. Cardiac exam shows a regular rate and rhythm. Harsh 2-3/6 outflow murmur noted.  Abdomen is soft. Extremities with 1+ edema. Multiple venous varicosities. Gait and ROM are intact. No gross neurologic deficits noted.  Wt Readings from Last 3 Encounters:  01/25/16 194 lb 9.6 oz (88.3 kg)  09/17/15 186 lb (84.4 kg)  07/09/15 191 lb (86.6 kg)     LABORATORY DATA:  Lab Results  Component Value Date   WBC 20.9 (H) 09/17/2015   HGB 13.0 09/17/2015   HCT 38.6 (L) 09/17/2015   PLT 161 09/17/2015   GLUCOSE  126 (H) 09/17/2015   CHOL 133 11/17/2012   TRIG 105 11/17/2012   HDL 54 11/17/2012   LDLCALC 58 11/17/2012   ALT 12 (L) 02/05/2015   AST 18 02/05/2015   NA 140 09/17/2015   K 3.8 09/17/2015   CL 106 09/17/2015   CREATININE 1.72 (H) 09/17/2015   BUN 37 (H) 09/17/2015   CO2 25 09/17/2015   TSH 1.453Test methodology is 3rd generation TSH 08/17/2008   INR 1.09 02/05/2015   HGBA1C 6.2 (H) 11/16/2012    Echo 01/06/15: Study Conclusions  - Left ventricle: Septal , apical inferior apical and distal   anterior wall akinesis The cavity size was moderately dilated.   Wall thickness was increased in a pattern of mild LVH. Systolic   function was moderately to severely reduced. The estimated   ejection fraction was 30%. Doppler parameters are consistent with   abnormal left ventricular relaxation (grade 1 diastolic   dysfunction). - Aortic valve: There was moderate stenosis. There was mild   regurgitation. Valve area (VTI): 0.92 cm^2. Valve area (Vmax):   0.93 cm^2. Valve area (Vmean): 0.88 cm^2. - Mitral valve: Calcified annulus. Valve area by pressure   half-time: 1.38 cm^2. Valve area by continuity equation (using   LVOT flow): 1.38 cm^2. - Left atrium: The atrium was moderately dilated. - Atrial septum: No defect or patent foramen ovale was identified. - Pulmonary arteries: PA peak pressure: 32 mm Hg (S).  Ecg today shows NSR, incomplete RBBB, old anteroseptal infarct. LAFB. No change. I have personally reviewed and interpreted this study.   Assessment /  Plan:  1. Chronic systolic HF - EF [id number]- he is  well compensated clinically. Continue sodium restriction. Continue current diuretic dose. He is instructed to take an extra lasix for weight gain or increased swelling.   Not a candidate for beta blockers due to bradycardia. Not a candidate for ACEi due to hypotension and CKD. Intolerant of nitrates due to hypotension.  2. CAD -remote anterior MI and stent of LAD. Last cath April 2015 showed nonobstructive disease. No active symptoms.   3. Syncope - has loop recorder in place - no significant arrhythmia. Syncope most likely related to hypotension on Flomax.   4. CKD- stage 3.   5. AS -moderate. No  symptoms. Will update Echo.  6. Situational depression/anxiety- improved some today.  7. Hyperlipidemia. Check blood work today.

## 2016-01-25 ENCOUNTER — Encounter: Payer: Self-pay | Admitting: Cardiology

## 2016-01-25 ENCOUNTER — Ambulatory Visit (INDEPENDENT_AMBULATORY_CARE_PROVIDER_SITE_OTHER): Payer: Medicare Other | Admitting: Cardiology

## 2016-01-25 VITALS — BP 140/82 | HR 57 | Ht 70.0 in | Wt 194.6 lb

## 2016-01-25 DIAGNOSIS — I2510 Atherosclerotic heart disease of native coronary artery without angina pectoris: Secondary | ICD-10-CM | POA: Diagnosis not present

## 2016-01-25 DIAGNOSIS — I5022 Chronic systolic (congestive) heart failure: Secondary | ICD-10-CM

## 2016-01-25 DIAGNOSIS — I350 Nonrheumatic aortic (valve) stenosis: Secondary | ICD-10-CM

## 2016-01-25 DIAGNOSIS — I10 Essential (primary) hypertension: Secondary | ICD-10-CM

## 2016-01-25 DIAGNOSIS — E7800 Pure hypercholesterolemia, unspecified: Secondary | ICD-10-CM

## 2016-01-25 LAB — CBC WITH DIFFERENTIAL/PLATELET
BASOS ABS: 0 {cells}/uL (ref 0–200)
Basophils Relative: 0 %
EOS ABS: 180 {cells}/uL (ref 15–500)
Eosinophils Relative: 3 %
HCT: 37 % — ABNORMAL LOW (ref 38.5–50.0)
Hemoglobin: 12.1 g/dL — ABNORMAL LOW (ref 13.2–17.1)
LYMPHS PCT: 28 %
Lymphs Abs: 1680 cells/uL (ref 850–3900)
MCH: 30.7 pg (ref 27.0–33.0)
MCHC: 32.7 g/dL (ref 32.0–36.0)
MCV: 93.9 fL (ref 80.0–100.0)
MONOS PCT: 13 %
MPV: 10.9 fL (ref 7.5–12.5)
Monocytes Absolute: 780 cells/uL (ref 200–950)
NEUTROS PCT: 56 %
Neutro Abs: 3360 cells/uL (ref 1500–7800)
PLATELETS: 171 10*3/uL (ref 140–400)
RBC: 3.94 MIL/uL — ABNORMAL LOW (ref 4.20–5.80)
RDW: 14.4 % (ref 11.0–15.0)
WBC: 6 10*3/uL (ref 3.8–10.8)

## 2016-01-25 LAB — HEPATIC FUNCTION PANEL
ALBUMIN: 4 g/dL (ref 3.6–5.1)
ALK PHOS: 65 U/L (ref 40–115)
ALT: 11 U/L (ref 9–46)
AST: 16 U/L (ref 10–35)
BILIRUBIN INDIRECT: 0.5 mg/dL (ref 0.2–1.2)
Bilirubin, Direct: 0.1 mg/dL (ref <=0.2)
TOTAL PROTEIN: 7 g/dL (ref 6.1–8.1)
Total Bilirubin: 0.6 mg/dL (ref 0.2–1.2)

## 2016-01-25 LAB — LIPID PANEL
Cholesterol: 128 mg/dL (ref <200)
HDL: 55 mg/dL (ref >40)
LDL Cholesterol: 50 mg/dL (ref <100)
TRIGLYCERIDES: 113 mg/dL (ref <150)
Total CHOL/HDL Ratio: 2.3 Ratio (ref <5.0)
VLDL: 23 mg/dL (ref <30)

## 2016-01-25 LAB — BASIC METABOLIC PANEL
BUN: 32 mg/dL — ABNORMAL HIGH (ref 7–25)
CALCIUM: 8.9 mg/dL (ref 8.6–10.3)
CO2: 26 mmol/L (ref 20–31)
CREATININE: 1.5 mg/dL — AB (ref 0.70–1.11)
Chloride: 107 mmol/L (ref 98–110)
Glucose, Bld: 93 mg/dL (ref 65–99)
Potassium: 4.6 mmol/L (ref 3.5–5.3)
Sodium: 142 mmol/L (ref 135–146)

## 2016-01-25 MED ORDER — NITROGLYCERIN 0.4 MG SL SUBL: 0.4000 mg | SUBLINGUAL_TABLET | SUBLINGUAL | 3 refills | Status: DC | PRN

## 2016-01-25 NOTE — Patient Instructions (Signed)
We will check blood work  We will schedule you for an Echocardiogram  I will see you in 6 months.

## 2016-01-26 ENCOUNTER — Telehealth: Payer: Self-pay

## 2016-01-26 NOTE — Telephone Encounter (Signed)
Spoke to patient he stated his medication on after visit summary incorrect.Stated he is no longer taking lorazepam,clonzepam,melatonin.

## 2016-02-08 ENCOUNTER — Ambulatory Visit (INDEPENDENT_AMBULATORY_CARE_PROVIDER_SITE_OTHER): Payer: Medicare Other | Admitting: *Deleted

## 2016-02-08 DIAGNOSIS — R55 Syncope and collapse: Secondary | ICD-10-CM

## 2016-02-08 NOTE — Progress Notes (Signed)
carelink summary report 

## 2016-02-14 ENCOUNTER — Telehealth: Payer: Self-pay | Admitting: Cardiology

## 2016-02-17 ENCOUNTER — Telehealth: Payer: Self-pay | Admitting: Cardiology

## 2016-02-17 ENCOUNTER — Other Ambulatory Visit: Payer: Self-pay

## 2016-02-17 ENCOUNTER — Ambulatory Visit (HOSPITAL_COMMUNITY): Payer: Medicare Other | Attending: Cardiovascular Disease

## 2016-02-17 DIAGNOSIS — E7800 Pure hypercholesterolemia, unspecified: Secondary | ICD-10-CM | POA: Diagnosis not present

## 2016-02-17 DIAGNOSIS — I082 Rheumatic disorders of both aortic and tricuspid valves: Secondary | ICD-10-CM | POA: Diagnosis not present

## 2016-02-17 DIAGNOSIS — I110 Hypertensive heart disease with heart failure: Secondary | ICD-10-CM | POA: Diagnosis not present

## 2016-02-17 DIAGNOSIS — I252 Old myocardial infarction: Secondary | ICD-10-CM | POA: Insufficient documentation

## 2016-02-17 DIAGNOSIS — I5022 Chronic systolic (congestive) heart failure: Secondary | ICD-10-CM

## 2016-02-17 DIAGNOSIS — I10 Essential (primary) hypertension: Secondary | ICD-10-CM

## 2016-02-17 DIAGNOSIS — I2510 Atherosclerotic heart disease of native coronary artery without angina pectoris: Secondary | ICD-10-CM

## 2016-02-17 DIAGNOSIS — I350 Nonrheumatic aortic (valve) stenosis: Secondary | ICD-10-CM

## 2016-02-17 DIAGNOSIS — I255 Ischemic cardiomyopathy: Secondary | ICD-10-CM | POA: Diagnosis not present

## 2016-02-17 NOTE — Telephone Encounter (Signed)
LMOVM for pt to return my call in regards to his bill.

## 2016-02-18 NOTE — Telephone Encounter (Signed)
Spoke w/ pt and explained to him why he is getting a second charge on his loop recorder bill now. Informed him that it means the MD is reviewing the report now and that we have always done this in the past but [place] just allowed [place] to bill for it. Pt was a little frustrated b/c no one told him. I explained to pt that typically we monitor loop recorder pt's for 3 years and once the battery reaches RRT then we stop monitoring. I informed pt that once this happens he has a couple options. One come see MD and discuss having loop recorder moved or two keep loop recorder in place b/c it will not harm him to keep it in. Pt verbalized understanding. Offered pt to talk to MD about discontinuing monitor now. Pt stated that he will wait it out and once the loop recorder battery dies he will make a decision about an appt w/ EP MD.

## 2016-02-26 LAB — CUP PACEART REMOTE DEVICE CHECK
Implantable Pulse Generator Implant Date: 20150413
MDC IDC SESS DTM: 20171203010603

## 2016-02-26 NOTE — Progress Notes (Signed)
Carelink summary report received. Battery status OK. Normal device function. No new symptom episodes, brady, or pause episodes. No new AF episodes. 1 tachy- ECG appears noise/artifact.Monthly summary reports and ROV/PRN

## 2016-03-08 ENCOUNTER — Ambulatory Visit (INDEPENDENT_AMBULATORY_CARE_PROVIDER_SITE_OTHER): Payer: Medicare Other | Admitting: *Deleted

## 2016-03-08 DIAGNOSIS — R55 Syncope and collapse: Secondary | ICD-10-CM | POA: Diagnosis not present

## 2016-03-08 NOTE — Telephone Encounter (Signed)
Close encounter 

## 2016-03-09 NOTE — Progress Notes (Signed)
Carelink Summary Report / Loop Recorder 

## 2016-03-13 ENCOUNTER — Emergency Department (HOSPITAL_BASED_OUTPATIENT_CLINIC_OR_DEPARTMENT_OTHER): Payer: Medicare Other

## 2016-03-13 ENCOUNTER — Encounter (HOSPITAL_BASED_OUTPATIENT_CLINIC_OR_DEPARTMENT_OTHER): Payer: Self-pay

## 2016-03-13 ENCOUNTER — Emergency Department (HOSPITAL_BASED_OUTPATIENT_CLINIC_OR_DEPARTMENT_OTHER)
Admission: EM | Admit: 2016-03-13 | Discharge: 2016-03-14 | Disposition: A | Discharge status: Home / Self Care | Payer: Medicare Other | Attending: Emergency Medicine | Admitting: Emergency Medicine

## 2016-03-13 DIAGNOSIS — W19XXXA Unspecified fall, initial encounter: Secondary | ICD-10-CM | POA: Diagnosis not present

## 2016-03-13 DIAGNOSIS — M7989 Other specified soft tissue disorders: Secondary | ICD-10-CM | POA: Diagnosis not present

## 2016-03-13 DIAGNOSIS — Y999 Unspecified external cause status: Secondary | ICD-10-CM | POA: Diagnosis not present

## 2016-03-13 DIAGNOSIS — Z87891 Personal history of nicotine dependence: Secondary | ICD-10-CM | POA: Insufficient documentation

## 2016-03-13 DIAGNOSIS — Y939 Activity, unspecified: Secondary | ICD-10-CM | POA: Diagnosis not present

## 2016-03-13 DIAGNOSIS — Z79899 Other long term (current) drug therapy: Secondary | ICD-10-CM | POA: Insufficient documentation

## 2016-03-13 DIAGNOSIS — Y929 Unspecified place or not applicable: Secondary | ICD-10-CM | POA: Insufficient documentation

## 2016-03-13 DIAGNOSIS — I509 Heart failure, unspecified: Secondary | ICD-10-CM | POA: Diagnosis not present

## 2016-03-13 DIAGNOSIS — I2510 Atherosclerotic heart disease of native coronary artery without angina pectoris: Secondary | ICD-10-CM | POA: Diagnosis not present

## 2016-03-13 DIAGNOSIS — I110 Hypertensive heart disease with heart failure: Secondary | ICD-10-CM | POA: Insufficient documentation

## 2016-03-13 NOTE — ED Notes (Signed)
Pt upset that he has had to wait a long time. Pt encouraged to stay to see the EDP and that we already have his results that they need to talk to him about. Pt agreed to stay a bit longer.

## 2016-03-13 NOTE — ED Notes (Signed)
Patient transported to Ultrasound 

## 2016-03-13 NOTE — ED Triage Notes (Signed)
C/o swelling to left calf x 3 days-pt sent from [facility] to r/o DVT-pt states he failed to tell MD that he fell 1 days before swelling started-twisted left ankle-pain/numbess to left ankle-NAD-steady gait

## 2016-03-14 ENCOUNTER — Other Ambulatory Visit: Payer: Self-pay | Admitting: Internal Medicine

## 2016-03-14 NOTE — ED Provider Notes (Signed)
[facility] Provider Note: Georgena Spurling, MD, FACEP  CSN: [id number] MRN: [id number] ARRIVAL: 03/13/16 at 2015 ROOM: [place]  Leg Swelling   HISTORY OF PRESENT ILLNESS  Jonathan Acevedo is a 83 y.o. male who fell 5 days ago twisting his left ankle. He is subsequently developed edema in the left lower leg. He was seen at an [place] clinic yesterday where concern was raised for a DVT. He was sent here where he had x-ray of the left ankle and Doppler study of the left lower extremity. The patient states he is having no pain in his left ankle. He is able to bear weight and use it without difficulty. He complains only of some mild swelling in the calf region.   Past Medical History:  Diagnosis Date  . Afib ([place])   . Aortic stenosis   . Arthritis   . Cataract   . CHF (congestive heart failure) ([place])   . Coronary artery disease    w remote anterior myocardial infarction (1998)  . GERD (gastroesophageal reflux disease)   . Hypercalcemia 12/26/2011  . Hypercholesteremia   . Hypertension   . Inguinal hernia    right  . Kidney stones   . Myocardial infarct, old    3 times   . 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 Rectal polyp   . Renal insufficiency   . Syncope 05/18/2013  . Wears glasses     Past Surgical History:  Procedure Laterality Date  . CARDIAC CATHETERIZATION  08/18/2008   LMain 20, LAD stent 20 ISR, CFX 10, RCA 20, EF 45  . CATARACT EXTRACTION    . COLONOSCOPY W/ BIOPSIES AND POLYPECTOMY    . CORONARY STENT PLACEMENT  07/19/1996   Proximal LAD  . ELECTROPHYSIOLOGY STUDY  05-19-13   EPS for syncope without inducible arrhythmias by Dr Lovena Le  . ELECTROPHYSIOLOGY STUDY N/A 05/19/2013   Procedure: ELECTROPHYSIOLOGY STUDY;  Surgeon: Evans Lance, MD;  Location: [facility] CATH LAB;  Service: Cardiovascular;  Laterality: N/A;  . FRACTURE SURGERY     right hip  . INGUINAL HERNIA REPAIR     left  . INGUINAL HERNIA REPAIR Right 02/09/2015   Procedure:  RIGHT INGUINAL HERNIA REPAIR W/ MESH;  Surgeon: Jackolyn Confer, MD;  Location: [place];  Service: General;  Laterality: Right;  inguinal hernia  . INSERTION OF MESH Right 02/09/2015   Procedure: INSERTION OF MESH to right inguinal hernia;  Surgeon: Jackolyn Confer, MD;  Location: [place];  Service: General;  Laterality: Right;  . LEFT HEART CATHETERIZATION WITH CORONARY ANGIOGRAM N/A 05/19/2013   Procedure: LEFT HEART CATHETERIZATION WITH CORONARY ANGIOGRAM;  Surgeon: Peter M Martinique, MD;  Location: [facility] CATH LAB;  Service: Cardiovascular;  Laterality: N/A;  . LOOP RECORDER IMPLANT  05-19-13   MDT LinQ implanted by Dr Lovena Le for syncope following negative EPS  . ORIF FEMUR FRACTURE     right  . parathyroidectomy    . UMBILICAL HERNIA REPAIR      No family history on file.  Social History  Substance Use Topics  . Smoking status: Former Smoker    Packs/day: 1.00    Years: 45.00    Types: Cigarettes    Quit date: 07/19/1996  . Smokeless tobacco: Never Used  . Alcohol use No    Prior to Admission medications   Medication Sig Start Date End Date Taking? Authorizing Provider  atorvastatin (LIPITOR) 40 MG tablet Take 1 tablet by mouth  daily 08/13/15  Peter M Martinique, MD  calcium-vitamin D (OSCAL WITH D) 500-200 MG-UNIT tablet Take 1 tablet by mouth daily with breakfast.    Historical Provider, MD  Cholecalciferol (VITAMIN D) 2000 units CAPS Take 1 capsule by mouth daily.    Historical Provider, MD  furosemide (LASIX) 40 MG tablet TAKE 1 TABLET BY MOUTH IN  THE MORNING AND 1/2 TABLET  IN THE EVENING MAY TAKE ONE MORE TABLET FOR WEIGHT GAIN OR INCREASE SWELLING 12/06/15   Peter M Martinique, MD  HYDROcodone-acetaminophen (NORCO/VICODIN) 5-325 MG tablet Take 1 tablet by mouth every 6 (six) hours as needed for moderate pain.    Historical Provider, MD  nitroGLYCERIN (NITROSTAT) 0.4 MG SL tablet Place 1 tablet (0.4 mg total) under the tongue every 5 (five) minutes as needed for chest pain. 01/25/16   Peter M  Martinique, MD  potassium chloride SA (K-DUR,KLOR-CON) 20 MEQ tablet Take 1 tablet by mouth  daily 08/13/15   Peter M Martinique, MD    Allergies Ibandronic acid; Risedronate sodium; and Flomax [tamsulosin hcl]   REVIEW OF SYSTEMS  Negative except as noted here or in the History of Present Illness.   PHYSICAL EXAMINATION  Initial Vital Signs Blood pressure 162/84, pulse (!) 58, temperature 98.6 F (37 C), temperature source Oral, resp. rate 20, SpO2 96 %.  Examination General: Well-developed, well-nourished male in no acute distress; appearance consistent with age of record HENT: normocephalic; atraumatic Eyes: Normal appearance Neck: supple Heart: regular rate and rhythm; heart murmur right upper sternal border Lungs: clear to auscultation bilaterally Abdomen: soft; nondistended Extremities: No deformity; full range of motion; pulses normal; trace edema of left lower leg; no tenderness or swelling of left ankle Neurologic: Awake, alert and oriented; motor function intact in all extremities and symmetric; no facial droop Skin: Warm and dry Psychiatric: Normal mood and affect   RESULTS  Summary of this visit's results, reviewed by myself:   EKG Interpretation  Date/Time:    Ventricular Rate:    PR Interval:    QRS Duration:   QT Interval:    QTC Calculation:   R Axis:     Text Interpretation:        Laboratory Studies: No results found for this or any previous visit (from the past 24 hour(s)). Imaging Studies: Dg Ankle Complete Left  Result Date: 03/13/2016 CLINICAL DATA:  Left ankle pain after a fall 4 days ago. EXAM: LEFT ANKLE COMPLETE - 3+ VIEW COMPARISON:  None. FINDINGS: Degenerative changes in the left ankle involving the tibiotalar and talofibular joints. Slight irregularity of the medial talar dome could represent impaction fracture or osteochondral defect. No evidence of acute fracture or dislocation otherwise. Soft tissues are unremarkable. No focal bone lesions or  bone destruction. Degenerative changes in the intertarsal joints. IMPRESSION: Irregularity of the medial talar dome could represent impaction fracture or osteochondral defect. Degenerative changes in the left foot and ankle. Electronically Signed   By: Lucienne Capers M.D.   On: 03/13/2016 21:53   US Venous Img Lower Unilateral Left  Result Date: 03/13/2016 CLINICAL DATA:  Lower extremity pain and edema for 3 days. Recent trauma EXAM: LEFT LOWER EXTREMITY VENOUS DUPLEX ULTRASOUND TECHNIQUE: Gray-scale sonography with graded compression, as well as color Doppler and duplex ultrasound were performed to evaluate the left lower extremity deep venous system from the level of the common femoral vein and including the common femoral, femoral, profunda femoral, popliteal and calf veins including the posterior tibial, peroneal and gastrocnemius veins when visible. The superficial  great saphenous vein was also interrogated. Spectral Doppler was utilized to evaluate flow at rest and with distal augmentation maneuvers in the common femoral, femoral and popliteal veins. COMPARISON:  None. FINDINGS: Contralateral Common Femoral Vein: Respiratory phasicity is normal and symmetric with the symptomatic side. No evidence of thrombus. Normal compressibility. Common Femoral Vein: No evidence of thrombus. Normal compressibility, respiratory phasicity and response to augmentation. Saphenofemoral Junction: No evidence of thrombus. Normal compressibility and flow on color Doppler imaging. Profunda Femoral Vein: No evidence of thrombus. Normal compressibility and flow on color Doppler imaging. Femoral Vein: No evidence of thrombus. Normal compressibility, respiratory phasicity and response to augmentation. Popliteal Vein: No evidence of thrombus. Normal compressibility, respiratory phasicity and response to augmentation. Calf Veins: No evidence of thrombus. Normal compressibility and flow on color Doppler imaging. Superficial Great  Saphenous Vein: No evidence of thrombus in remaining regions. Normal compressibility and flow on color Doppler imaging. Note that a significant portion of the left greater saphenous vein has been ablated. Venous Reflux:  None. Other Findings:  None. IMPRESSION: No evidence of left lower extremity deep venous thrombosis. Right common femoral vein patent. Note that a significant portion of the left greater saphenous vein has been ablated. Electronically Signed   By: Lowella Grip III M.D.   On: 03/13/2016 21:57    ED COURSE  Nursing notes and initial vitals signs, including pulse oximetry, reviewed.  Vitals:   03/13/16 2028 03/13/16 2300  BP: 161/90 162/84  Pulse: 64 (!) 58  Resp: 20 20  Temp: 98.6 F (37 C)   TempSrc: Oral   SpO2: 99% 96%   1:20 AM Despite x-ray findings the patient's lack of symptoms as well as lack of significant findings on examination suggest he does not have a fracture. I do not believe immobilization is indicated at this time.  PROCEDURES    ED DIAGNOSES     ICD-9-CM ICD-10-CM   1. Leg swelling 729.81 M79.89        Shanon Rosser, MD 03/14/16 [phone]

## 2016-03-20 LAB — CUP PACEART REMOTE DEVICE CHECK
Date Time Interrogation Session: 20180102013701
MDC IDC PG IMPLANT DT: 20150413

## 2016-04-06 LAB — CUP PACEART REMOTE DEVICE CHECK
Date Time Interrogation Session: 20180201023826
MDC IDC PG IMPLANT DT: 20150413

## 2016-04-07 ENCOUNTER — Ambulatory Visit (INDEPENDENT_AMBULATORY_CARE_PROVIDER_SITE_OTHER): Payer: Medicare Other | Admitting: *Deleted

## 2016-04-07 DIAGNOSIS — R55 Syncope and collapse: Secondary | ICD-10-CM | POA: Diagnosis not present

## 2016-04-10 NOTE — Progress Notes (Signed)
Carelink Summary Report / Loop Recorder 

## 2016-04-25 ENCOUNTER — Other Ambulatory Visit: Payer: Self-pay | Admitting: Internal Medicine

## 2016-04-26 LAB — CUP PACEART REMOTE DEVICE CHECK
Date Time Interrogation Session: 20180303024043
Implantable Pulse Generator Implant Date: 20150413

## 2016-05-08 ENCOUNTER — Ambulatory Visit (INDEPENDENT_AMBULATORY_CARE_PROVIDER_SITE_OTHER): Payer: Medicare Other | Admitting: *Deleted

## 2016-05-08 DIAGNOSIS — R55 Syncope and collapse: Secondary | ICD-10-CM

## 2016-05-08 NOTE — Progress Notes (Signed)
Carelink Summary Report / Loop Recorder 

## 2016-05-11 LAB — CUP PACEART REMOTE DEVICE CHECK
Date Time Interrogation Session: 20180402033939
MDC IDC PG IMPLANT DT: 20150413

## 2016-06-05 ENCOUNTER — Other Ambulatory Visit: Payer: Self-pay | Admitting: Cardiology

## 2016-06-05 NOTE — Telephone Encounter (Signed)
Rx has been sent to the pharmacy electronically. ° °

## 2016-06-06 ENCOUNTER — Ambulatory Visit (INDEPENDENT_AMBULATORY_CARE_PROVIDER_SITE_OTHER): Payer: Medicare Other | Admitting: *Deleted

## 2016-06-06 DIAGNOSIS — R55 Syncope and collapse: Secondary | ICD-10-CM

## 2016-06-07 NOTE — Progress Notes (Signed)
Carelink Summary Report / Loop Recorder 

## 2016-06-18 LAB — CUP PACEART REMOTE DEVICE CHECK
MDC IDC PG IMPLANT DT: 20150413
MDC IDC SESS DTM: 20180502040851

## 2016-06-18 NOTE — Progress Notes (Signed)
Carelink summary report received. Battery status OK. Normal device function. No new symptom episodes, brady, or pause episodes. No new AF episodes. 1 false tachy- ECG appears artifact/noise oversensing. Monthly summary reports and ROV/PRN

## 2016-07-06 ENCOUNTER — Ambulatory Visit (INDEPENDENT_AMBULATORY_CARE_PROVIDER_SITE_OTHER): Payer: Medicare Other | Admitting: *Deleted

## 2016-07-06 DIAGNOSIS — R55 Syncope and collapse: Secondary | ICD-10-CM

## 2016-07-06 NOTE — Progress Notes (Signed)
Carelink Summary Report 

## 2016-07-08 LAB — CUP PACEART REMOTE DEVICE CHECK
Date Time Interrogation Session: 20180601041106
MDC IDC PG IMPLANT DT: 20150413

## 2016-07-08 NOTE — Progress Notes (Signed)
Carelink summary report received. Battery status OK. Normal device function. No new symptom episodes, tachy episodes, brady, or pause episodes. No new AF episodes. Monthly summary reports and ROV/PRN 

## 2016-07-13 ENCOUNTER — Telehealth: Payer: Self-pay | Admitting: *Deleted

## 2016-07-13 NOTE — Telephone Encounter (Signed)
Left detailed message on home phone regarding LINQ at RRT since July 09, 2016. Gave device clinic phone number to call back to discuss next steps of scheduling an appointment with Dr. Lovena Le to discuss LINQ explant or leaving LINQ implanted.

## 2016-07-14 NOTE — Telephone Encounter (Signed)
Spoke with pt regarding loop recorder reaching [facility], Informed pt of his options to leave the loop recorder in or have it taken out which would entail a procedure over at the hospital, pt prefers to leave the loop reorder in at this time.

## 2016-07-25 NOTE — Progress Notes (Signed)
d  Jonathan Acevedo Date of Birth: 01-25-1929 Medical Record #[id number]  History of Present Illness: Jonathan Acevedo is seen for follow up of CAD and CHF.  He has a history of CAD with remote MI and prior stent to the LAD, HTN, CKD, AS and HLD. In April of 2014 a stress Myoview study showed extensive scar of the anterior apex and inferior wall. Ejection fraction is 44%. This actually represented an improvement from echocardiogram in 2012 at which time his ejection fraction was 35-40%. In O[place] 2014 he presented with a prolonged episode of chest pain. Echo showed no change in his moderate AS - medical management was continued.   He was admitted twice  in April 2015 with syncope that resulted in a fractured right ankle and acute systolic HF. Cardiac cath showed no obstructive disease and mild to moderate AS. He had a loop recorder placed. Syncope felt to be related to Flomax. Unable to take ACEi due to CKD and hypotension.  In July 2015 he was readmitted with acute on chronic CHF. Diuresed with good result. No recurrent syncope.   Echo November 2016 showed moderate AS with mean gradient 17 mm Hg. EF 30%. Repeat Echo in January 2018 showed stable EF of 30-35% with increase mean AV gradient to 36 mm Hg  Last loop recorder evaluation on 07/06/16 showed normal device function and no Afib.   On follow up today he is doing OK. Still upset about his wife's passing. He denies any chest pain or SOB. No increased in edema. Weight is stable. No dizziness or syncope.  He is swimming 5x/week. Does a lot of yard and housework. Some bursitis in left elbow.  Current Outpatient Prescriptions  Medication Sig Dispense Refill  . atorvastatin (LIPITOR) 40 MG tablet Take 1 tablet by mouth  daily 90 tablet 3  . calcium-vitamin D (OSCAL WITH D) 500-200 MG-UNIT tablet Take 1 tablet by mouth daily with breakfast.    . furosemide (LASIX) 40 MG tablet TAKE 1 TABLET BY MOUTH IN  THE MORNING AND 1/2 TABLET  IN THE EVENING MAY TAKE ONE  MORE TABLET FOR WEIGHT GAIN OR INCREASE SWELLING 180 tablet 1  . nitroGLYCERIN (NITROSTAT) 0.4 MG SL tablet Place 1 tablet (0.4 mg total) under the tongue every 5 (five) minutes as needed for chest pain. 25 tablet 3  . potassium chloride SA (K-DUR,KLOR-CON) 20 MEQ tablet Take 1 tablet by mouth  daily 90 tablet 3  . traMADol (ULTRAM) 50 MG tablet Take 50-100 mg by mouth every 6 (six) hours as needed.    . vitamin B-12 (CYANOCOBALAMIN) 1000 MCG tablet Take 1,000 mcg by mouth every morning.     No current facility-administered medications for this visit.     Allergies  Allergen Reactions  . Ibandronic Acid Other (See Comments)    Muscle Aches-pt denies  . Risedronate Sodium Other (See Comments)    Muscle aches  . Flomax [Tamsulosin Hcl] Other (See Comments)    Syncope     Past Medical History:  Diagnosis Date  . Afib ([place])   . Aortic stenosis   . Arthritis   . Cataract   . CHF (congestive heart failure) ([place])   . Coronary artery disease    w remote anterior myocardial infarction (1998)  . GERD (gastroesophageal reflux disease)   . Hypercalcemia 12/26/2011  . Hypercholesteremia   . Hypertension   . Inguinal hernia    right  . Kidney stones   . Myocardial infarct, old  3 times   . 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 Rectal polyp   . Renal insufficiency   . Syncope 05/18/2013  . Wears glasses     Past Surgical History:  Procedure Laterality Date  . CARDIAC CATHETERIZATION  08/18/2008   LMain 20, LAD stent 20 ISR, CFX 10, RCA 20, EF 45  . CATARACT EXTRACTION    . COLONOSCOPY W/ BIOPSIES AND POLYPECTOMY    . CORONARY STENT PLACEMENT  07/19/1996   Proximal LAD  . ELECTROPHYSIOLOGY STUDY  05-19-13   EPS for syncope without inducible arrhythmias by Dr Lovena Le  . ELECTROPHYSIOLOGY STUDY N/A 05/19/2013   Procedure: ELECTROPHYSIOLOGY STUDY;  Surgeon: Evans Lance, MD;  Location: [facility] CATH LAB;  Service: Cardiovascular;  Laterality: N/A;  . FRACTURE SURGERY     right hip  .  INGUINAL HERNIA REPAIR     left  . INGUINAL HERNIA REPAIR Right 02/09/2015   Procedure: RIGHT INGUINAL HERNIA REPAIR W/ MESH;  Surgeon: Jackolyn Confer, MD;  Location: [place];  Service: General;  Laterality: Right;  inguinal hernia  . INSERTION OF MESH Right 02/09/2015   Procedure: INSERTION OF MESH to right inguinal hernia;  Surgeon: Jackolyn Confer, MD;  Location: [place];  Service: General;  Laterality: Right;  . LEFT HEART CATHETERIZATION WITH CORONARY ANGIOGRAM N/A 05/19/2013   Procedure: LEFT HEART CATHETERIZATION WITH CORONARY ANGIOGRAM;  Surgeon: Peter M Martinique, MD;  Location: [facility] CATH LAB;  Service: Cardiovascular;  Laterality: N/A;  . LOOP RECORDER IMPLANT  05-19-13   MDT LinQ implanted by Dr Lovena Le for syncope following negative EPS  . ORIF FEMUR FRACTURE     right  . parathyroidectomy    . UMBILICAL HERNIA REPAIR      History  Smoking Status  . Former Smoker  . Packs/day: 1.00  . Years: 45.00  . Types: Cigarettes  . Quit date: 07/19/1996  Smokeless Tobacco  . Never Used    History  Alcohol Use No    History reviewed. No pertinent family history.  Review of Systems: The review of systems is per the HPI.  All other systems were reviewed and are negative.  Physical Exam: BP 140/74   Pulse (!) 56   Ht 5\' 10"  (1.778 m)   Wt 184 lb (83.5 kg)   BMI 26.40 kg/m  Patient is very pleasant and in no acute distress. Skin is warm and dry. Color is normal.  HEENT is unremarkable. Normocephalic/atraumatic. PERRL. Sclera are nonicteric. Neck is supple. No masses. No JVD. Lungs are clear. Cardiac exam shows a regular rate and rhythm. Harsh 2-3/6 outflow murmur noted radiating to the apex.. Abdomen is soft. Extremities with tr edema. Multiple venous varicosities. Gait and ROM are intact. No gross neurologic deficits noted.  Wt Readings from Last 3 Encounters:  07/27/16 184 lb (83.5 kg)  01/25/16 194 lb 9.6 oz (88.3 kg)  09/17/15 186 lb (84.4 kg)     LABORATORY DATA:  Lab Results    Component Value Date   WBC 6.0 01/25/2016   HGB 12.1 (L) 01/25/2016   HCT 37.0 (L) 01/25/2016   PLT 171 01/25/2016   GLUCOSE 93 01/25/2016   CHOL 128 01/25/2016   TRIG 113 01/25/2016   HDL 55 01/25/2016   LDLCALC 50 01/25/2016   ALT 11 01/25/2016   AST 16 01/25/2016   NA 142 01/25/2016   K 4.6 01/25/2016   CL 107 01/25/2016   CREATININE 1.50 (H) 01/25/2016   BUN 32 (H) 01/25/2016   CO2 26 01/25/2016  TSH 1.453 Test methodology is 3rd generation TSH 08/17/2008   INR 1.09 02/05/2015   HGBA1C 6.2 (H) 11/16/2012     Echo 02/17/16: Study Conclusions  - Left ventricle: The cavity size was moderately dilated. Wall   thickness was normal. Systolic function was moderately to   severely reduced. The estimated ejection fraction was in the   range of 30% to 35%. Akinesis of the mid-apicalanteroseptal   myocardium. Doppler parameters are consistent with abnormal left   ventricular relaxation (grade 1 diastolic dysfunction). - Aortic valve: Moderately to severely calcified annulus. Mildly   thickened, moderately calcified leaflets. There was moderate to   severe stenosis. There was mild regurgitation. Mean gradient (S):   36 mm Hg. Peak gradient (S): 60 mm Hg. Valve area (VTI): 1.23   cm^2. Valve area (Vmax): 1.28 cm^2. Valve area (Vmean): 1.31   cm^2. - Mitral valve: Mean gradient (D): 2 mm Hg. Valve area by   continuity equation (using LVOT flow): 3.11 cm^2. - Left atrium: The atrium was severely dilated. - Right atrium: The atrium was mildly dilated. - Pulmonary arteries: Systolic pressure was moderately increased.   PA peak pressure: 47 mm Hg (S).   Assessment / Plan:  1. Chronic systolic HF - EF 00%- he is  well compensated clinically. Continue sodium restriction. Continue current diuretic dose. He is instructed to take an extra lasix for weight gain or increased swelling.   Weight is down 10 lbs from prior visit. Not a candidate for beta blockers due to bradycardia. Not a  candidate for ACEi due to hypotension and CKD. Intolerant of nitrates due to hypotension.  2. CAD -remote anterior MI and stent of LAD. Last cath April 2015 showed nonobstructive disease. No active symptoms.   3. Syncope - has loop recorder in place - no significant arrhythmia. Syncope most likely related to hypotension on Flomax.   4. CKD- stage 3.   5. AS -moderate to severe. No  symptoms. Repeat Echo yearly.  6. Situational depression/anxiety- improved some today.  7. Hyperlipidemia. Check blood work today.

## 2016-07-26 ENCOUNTER — Telehealth: Payer: Self-pay | Admitting: Internal Medicine

## 2016-07-26 NOTE — Telephone Encounter (Signed)
New message    Pt is calling asking what to do with his home monitor if he stops the battery. Please call.

## 2016-07-27 ENCOUNTER — Encounter: Payer: Self-pay | Admitting: Cardiology

## 2016-07-27 ENCOUNTER — Ambulatory Visit (INDEPENDENT_AMBULATORY_CARE_PROVIDER_SITE_OTHER): Payer: Medicare Other | Admitting: Cardiology

## 2016-07-27 VITALS — BP 140/74 | HR 56 | Ht 70.0 in | Wt 184.0 lb

## 2016-07-27 DIAGNOSIS — I2510 Atherosclerotic heart disease of native coronary artery without angina pectoris: Secondary | ICD-10-CM | POA: Diagnosis not present

## 2016-07-27 DIAGNOSIS — I10 Essential (primary) hypertension: Secondary | ICD-10-CM

## 2016-07-27 DIAGNOSIS — I5022 Chronic systolic (congestive) heart failure: Secondary | ICD-10-CM

## 2016-07-27 DIAGNOSIS — I350 Nonrheumatic aortic (valve) stenosis: Secondary | ICD-10-CM

## 2016-07-27 DIAGNOSIS — E7800 Pure hypercholesterolemia, unspecified: Secondary | ICD-10-CM

## 2016-07-27 NOTE — Telephone Encounter (Signed)
LMOM to return call to Device Clinic. 

## 2016-07-27 NOTE — Patient Instructions (Signed)
Continue your current therapy  I will see you in 6 months.   

## 2016-07-27 NOTE — Telephone Encounter (Signed)
I let [name] know that he has been sent a return kit with pre-paid postage to return monitor and accessories to [employer]. He should receive that in the next 2 weeks. He verbalizes understanding and denies additional questions.

## 2016-08-29 ENCOUNTER — Other Ambulatory Visit: Payer: Self-pay | Admitting: Cardiology

## 2016-09-12 ENCOUNTER — Other Ambulatory Visit: Payer: Self-pay | Admitting: Cardiology

## 2016-09-13 NOTE — Telephone Encounter (Signed)
Rx(s) sent to pharmacy electronically.  

## 2016-09-14 ENCOUNTER — Other Ambulatory Visit: Payer: Self-pay

## 2016-09-14 MED ORDER — ATORVASTATIN CALCIUM 40 MG PO TABS: 40.0000 mg | ORAL_TABLET | Freq: Every day | ORAL | 3 refills | Status: DC

## 2016-09-14 MED ORDER — POTASSIUM CHLORIDE CRYS ER 20 MEQ PO TBCR: 20.0000 meq | EXTENDED_RELEASE_TABLET | Freq: Every day | ORAL | 3 refills | Status: DC

## 2016-10-20 ENCOUNTER — Other Ambulatory Visit: Payer: Self-pay | Admitting: Internal Medicine

## 2016-10-31 ENCOUNTER — Other Ambulatory Visit: Payer: Self-pay | Admitting: Cardiology

## 2016-11-07 DIAGNOSIS — H2511 Age-related nuclear cataract, right eye: Secondary | ICD-10-CM | POA: Insufficient documentation

## 2016-11-09 IMAGING — CR DG CHEST 1V PORT
1 series · 1 of 1 positions shown · non-contrast
Comparison: 03/12/2014

CLINICAL DATA: Left chest pain x1 hour. Coronary artery disease,
aortic stenosis, previous tobacco abuse

EXAM:
PORTABLE CHEST - 1 VIEW

[AP]
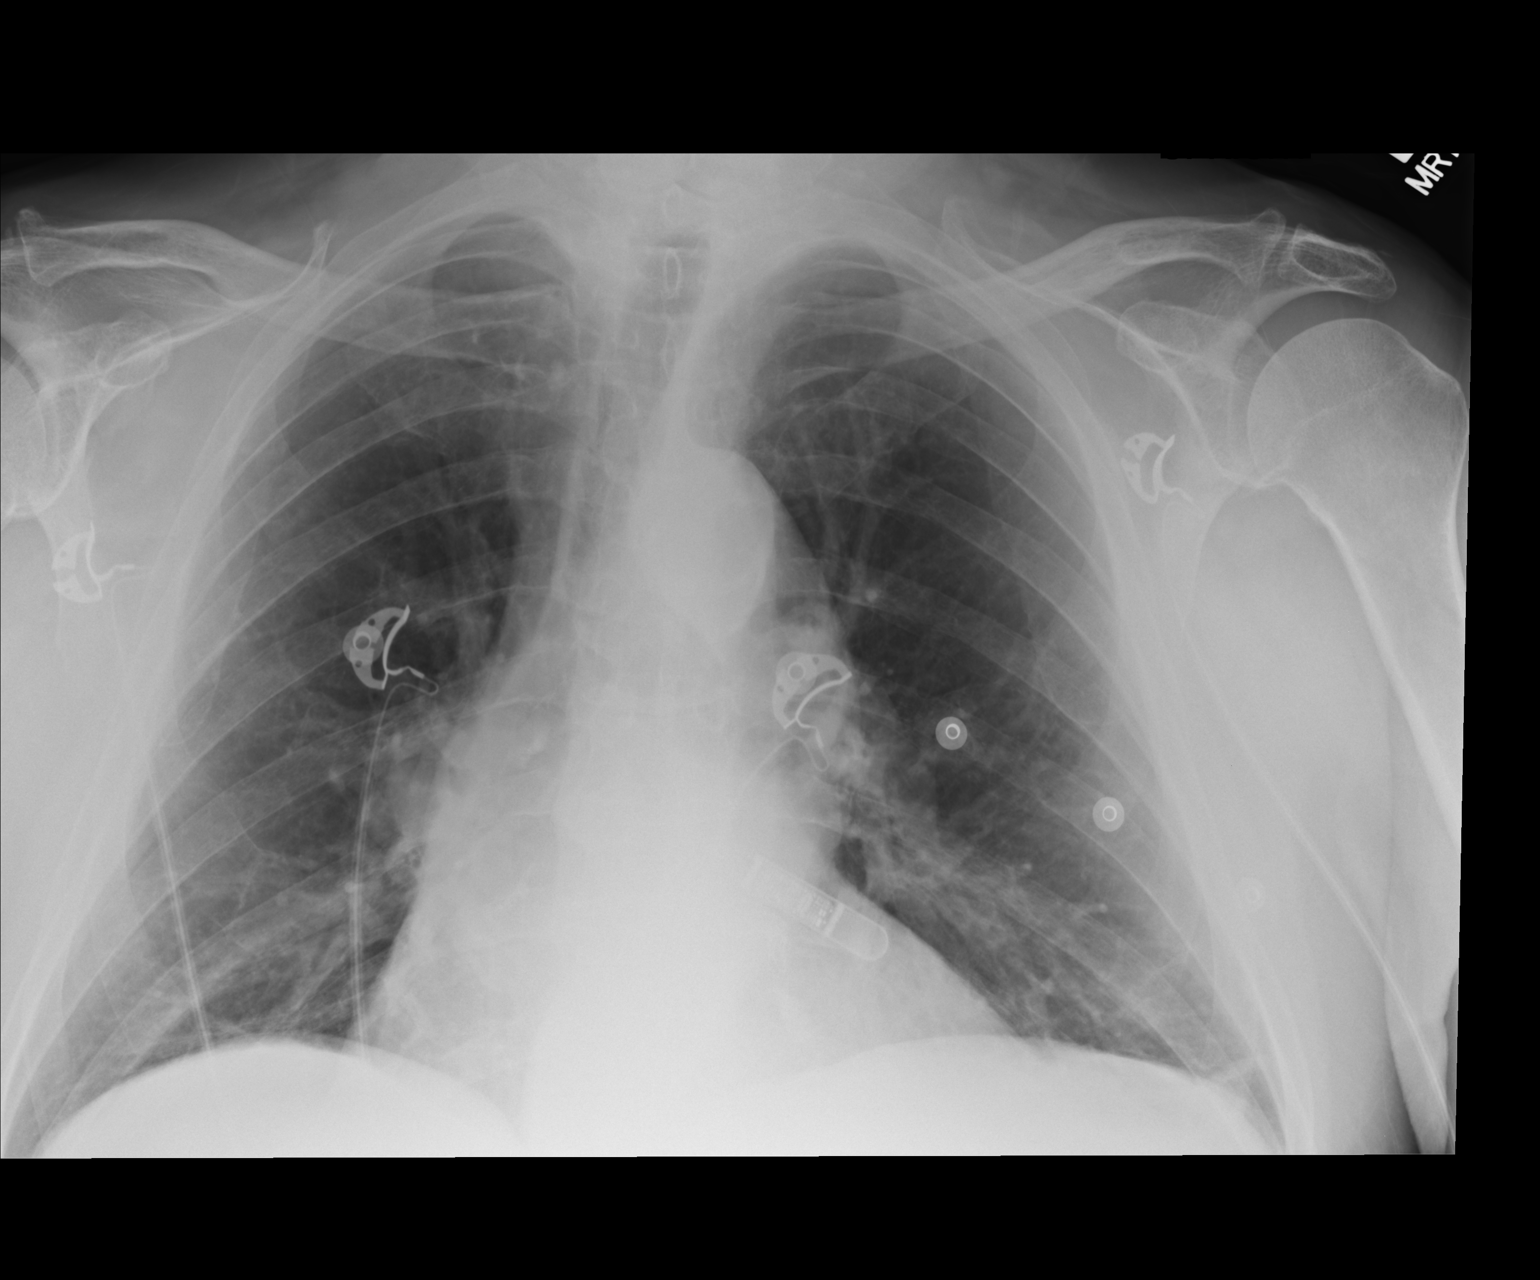

[1 of 1 positions shown; findings below may reference images not displayed]

FINDINGS: Implanted event monitor to the left of midline as before. Coarse
interstitial markings in the lung bases. No confluent airspace
consolidation or overt edema. No definite effusion although the
lateral costophrenic angles are excluded. Heart size within normal
limits. Tortuous thoracic aorta.
No pneumothorax.
Visualized skeletal structures are unremarkable.
IMPRESSION: 1. No acute disease

## 2016-12-25 ENCOUNTER — Other Ambulatory Visit: Payer: Self-pay

## 2016-12-25 NOTE — Patient Outreach (Signed)
[facility]) Care Management  12/25/2016  [place] 05-22-1928 [id number]   TELEPHONE SCREENING Referral date:12/21/16 Referral source: primary MD referral Referral reason: home safety visit/ assessment.   Insurance: [employer]  Attempt #1  SUBJECTIVE: Telephone call to patient regarding primary MD referral. HIPAA verified with patient. Discussed [facility] care management services with patient. Patient request call back at another time.   PLAN: RNCM will attempt 2nd telephone call to patient within 1 week.   Quinn Plowman RN,BSN,CCM [facility] Telephonic  [phone]

## 2017-01-01 ENCOUNTER — Other Ambulatory Visit: Payer: Self-pay

## 2017-01-01 ENCOUNTER — Telehealth: Payer: Self-pay | Admitting: *Deleted

## 2017-01-01 NOTE — Patient Outreach (Signed)
[facility]) Care Management  01/01/2017  [place] [birth year] [id number]   TELEPHONE SCREENING Referral date:12/21/16 Referral source: primary MD referral Referral reason: home safety visit/ assessment.   Insurance: [employer] care  Attempt #2  Second telephone call to patient regarding primary MD referral. HIPAA verified with patient. Discussed [facility] care management services with patient. Patient request call back at another time.   PLAN: RNCM will attempt 3rd telephone call to patient within 1 week.   Quinn Plowman RN,BSN,CCM [facility] Telephonic  [phone]

## 2017-01-01 NOTE — Telephone Encounter (Signed)
The patient came in as a walk in. He stated that he recently went to his PCP where an EKG was done. The PCP office was concerned about the findings of the EKG and that the patient may be having a heart attack. The patient was restricted with driving by the PCP. The patient denied any symptoms.   He came in wanting Dr. Martinique to look at the EKGs. Per. Dr Martinique, the EKG's looked similar to the ones that have been completed at this office. The patient has an appointment in January with Dr. Martinique where he will follow up. He stated that he will continue to not drive himself until that follow up.

## 2017-01-02 ENCOUNTER — Other Ambulatory Visit: Payer: Self-pay

## 2017-01-02 NOTE — Patient Outreach (Signed)
[facility]) Care Management  01/02/2017  Jonathan Acevedo Apr 29, 1928 [id number]   TELEPHONE SCREENING Referral date:12/21/16 Referral source:primary MD referral Referral reason:home safety visit/ assessment. Insurance:[employer] health care  Attempt #3  Third  telephone call to patient regarding primary MD referral. HIPAA verified with patient. Discussed [facility] care management services with patient. Patient request call back at another time.  PLAN:RNCM will  Send patient outreach letter to attempt contact  Quinn Plowman RN,BSN,CCM [facility] Telephonic  [phone]

## 2017-01-05 ENCOUNTER — Other Ambulatory Visit: Payer: Self-pay

## 2017-01-05 NOTE — Patient Outreach (Signed)
[facility]) Care Management  01/05/2017  AEMON KOELLER 03-01-1928 [id number]   TELEPHONE SCREENING Referral date:12/21/16 Referral source:primary MD referral Referral reason:home safety visit/ assessment. Insurance:United health care  Attempt #3  Received voice mail message from patient.  Return telephone call to patient regarding primary MD referral. Unable to reach patient at listed home number. HIPAA compliant message left with call back phone number. Attempted alternate mobile number. Unable to reach patient or leave voice message. Message states mailbox is full.   PLAN: Outreach letter was sent to patient on 01/02/17  Quinn Plowman RN,BSN,CCM [facility] Telephonic  [phone]

## 2017-01-17 ENCOUNTER — Other Ambulatory Visit: Payer: Self-pay

## 2017-01-17 NOTE — Patient Outreach (Signed)
[facility]) Care Management  01/17/2017  Jonathan Acevedo Dec 22, 1928 [id number]  No response from patient after 3 telephone calls and outreach letter attempt. Contacted Dr. Alan Ripper office and left message with [name] regarding closure due to being unable to reach.   PLAN:  RNCM will refer patient to care management assistant to close due to being unable to reach patient.  RNCM will send notification to patients primary MD of closure.   Quinn Plowman RN,BSN,CCM [facility] Telephonic  [phone]

## 2017-02-10 NOTE — Progress Notes (Signed)
d  Jonathan Acevedo Date of Birth: 12-22-1928 Medical Record #[id number]  History of Present Illness: Mr. Arquette is seen for follow up of CAD and CHF.  He has a history of CAD with remote MI and prior stent to the LAD, HTN, CKD, AS and HLD. In April of 2014 a stress Myoview study showed extensive scar of the anterior apex and inferior wall. Ejection fraction is 44%. This actually represented an improvement from echocardiogram in 2012 at which time his ejection fraction was 35-40%. In [place] 2014 he presented with a prolonged episode of chest pain. Echo showed no change in his moderate AS - medical management was continued.   He was admitted twice  in April 2015 with syncope that resulted in a fractured right ankle and acute systolic HF. Cardiac cath showed no obstructive disease and mild to moderate AS. He had a loop recorder placed. Syncope felt to be related to Flomax. Unable to take ACEi due to CKD and hypotension.  In July 2015 he was readmitted with acute on chronic CHF. Diuresed with good result. No recurrent syncope.   Echo November 2016 showed moderate AS with mean gradient 17 mm Hg. EF 30%. Repeat Echo in January 2018 showed stable EF of 30-35% with increase mean AV gradient to 36 mm Hg  Last loop recorder evaluation on 07/06/16 showed normal device function and no Afib.   On follow up today he is doing OK.  He denies any chest pain. He may have some SOB but is mainly limited by back pain.  No increased in edema. Weight is stable. No dizziness or syncope.  He is swimming 15-20 minutes 5x/week.   Current Outpatient Medications  Medication Sig Dispense Refill  . atorvastatin (LIPITOR) 40 MG tablet TAKE 1 TABLET BY MOUTH  DAILY 90 tablet 1  . calcium-vitamin D (OSCAL WITH D) 500-200 MG-UNIT tablet Take 1 tablet by mouth daily with breakfast.    . clonazePAM (KLONOPIN) 0.5 MG tablet Take 0.5 mg by mouth at bedtime.    . furosemide (LASIX) 40 MG tablet TAKE 1 TABLET BY MOUTH IN  THE MORNING AND  1/2 TABLET  IN THE EVENING MAY TAKE ONE MORE TABLET FOR WEIGHT GAIN OR INCREASE SWELLING 180 tablet 2  . nitroGLYCERIN (NITROSTAT) 0.4 MG SL tablet Place 1 tablet (0.4 mg total) under the tongue every 5 (five) minutes as needed for chest pain. 25 tablet 3  . potassium chloride SA (K-DUR,KLOR-CON) 20 MEQ tablet TAKE 1 TABLET BY MOUTH  DAILY 90 tablet 1  . traMADol (ULTRAM) 50 MG tablet Take 50-100 mg by mouth every 6 (six) hours as needed.    . vitamin B-12 (CYANOCOBALAMIN) 1000 MCG tablet Take 1,000 mcg by mouth every morning.     No current facility-administered medications for this visit.     Allergies  Allergen Reactions  . Ibandronic Acid Other (See Comments)    Muscle Aches-pt denies  . Risedronate Sodium Other (See Comments)    Muscle aches  . Flomax [Tamsulosin Hcl] Other (See Comments)    Syncope     Past Medical History:  Diagnosis Date  . Afib ([place])   . Aortic stenosis   . Arthritis   . Cataract   . CHF (congestive heart failure) ([place])   . Coronary artery disease    w remote anterior myocardial infarction (1998)  . GERD (gastroesophageal reflux disease)   . Hypercalcemia 12/26/2011  . Hypercholesteremia   . Hypertension   . Inguinal hernia  right  . Kidney stones   . Myocardial infarct, old    3 times   . 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 Rectal polyp   . Renal insufficiency   . Syncope 05/18/2013  . Wears glasses     Past Surgical History:  Procedure Laterality Date  . CARDIAC CATHETERIZATION  08/18/2008   [place] 20, LAD stent 20 ISR, CFX 10, RCA 20, EF 45  . CATARACT EXTRACTION    . COLONOSCOPY W/ BIOPSIES AND POLYPECTOMY    . CORONARY STENT PLACEMENT  07/19/1996   Proximal LAD  . ELECTROPHYSIOLOGY STUDY  05-19-13   EPS for syncope without inducible arrhythmias by Dr Lovena Le  . ELECTROPHYSIOLOGY STUDY N/A 05/19/2013   Procedure: ELECTROPHYSIOLOGY STUDY;  Surgeon: Evans Lance, MD;  Location: [facility] C[facility] LAB;  Service: Cardiovascular;  Laterality: N/A;    . FRACTURE SURGERY     right hip  . INGUINAL HERNIA REPAIR     left  . INGUINAL HERNIA REPAIR Right 02/09/2015   Procedure: RIGHT INGUINAL HERNIA REPAIR W/ MESH;  Surgeon: Jackolyn Confer, MD;  Location: [place];  Service: General;  Laterality: Right;  inguinal hernia  . INSERTION OF MESH Right 02/09/2015   Procedure: INSERTION OF MESH to right inguinal hernia;  Surgeon: Jackolyn Confer, MD;  Location: [place];  Service: General;  Laterality: Right;  . LEFT HEART CATHETERIZATION WITH CORONARY ANGIOGRAM N/A 05/19/2013   Procedure: LEFT HEART CATHETERIZATION WITH CORONARY ANGIOGRAM;  Surgeon: Peter M Martinique, MD;  Location: [facility] CATH LAB;  Service: Cardiovascular;  Laterality: N/A;  . LOOP RECORDER IMPLANT  05-19-13   MDT LinQ implanted by Dr Lovena Le for syncope following negative EPS  . ORIF FEMUR FRACTURE     right  . parathyroidectomy    . UMBILICAL HERNIA REPAIR      Social History   Tobacco Use  Smoking Status Former Smoker  . Packs/day: 1.00  . Years: 45.00  . Pack years: 45.00  . Types: Cigarettes  . Last attempt to quit: 07/19/1996  . Years since quitting: 20.5  Smokeless Tobacco Never Used    Social History   Substance and Sexual Activity  Alcohol Use No    History reviewed. No pertinent family history.  Review of Systems: The review of systems is per the HPI.  All other systems were reviewed and are negative.  Physical Exam: BP 122/72   Pulse 62   Ht 5\' 10"  (1.778 m)   Wt 186 lb 12.8 oz (84.7 kg)   BMI 26.80 kg/m  GENERAL:  Well appearing, WM in NAD HEENT:  PERRL, EOMI, sclera are clear. Oropharynx is clear. NECK:  No jugular venous distention, carotid upstroke brisk and symmetric, no bruits, no thyromegaly or adenopathy LUNGS:  Clear to auscultation bilaterally CHEST:  Unremarkable HEART:  RRR,  PMI not displaced or sustained,S1 and S2 within normal limits, no S3, no S4: no clicks, no rubs, there is a harsh gr 3/6 systolic murmur RUSB radiating across  precordium. ABD:  Soft, nontender. BS +, no masses or bruits. No hepatomegaly, no splenomegaly EXT:  2 + pulses throughout, no edema, no cyanosis no clubbing SKIN:  Warm and dry.  No rashes NEURO:  Alert and oriented x 3. Cranial nerves II through XII intact. PSYCH:  Cognitively intact    Wt Readings from Last 3 Encounters:  02/12/17 186 lb 12.8 oz (84.7 kg)  07/27/16 184 lb (83.5 kg)  01/25/16 194 lb 9.6 oz (88.3 kg)     LABORATORY DATA:  Ecg today shows NSR with rate 62. LAFB, old anteroseptal infarct, LVH. No changed from prior. I have personally reviewed and interpreted this study.  Lab Results  Component Value Date   CHOL 128 01/25/2016   HDL 55 01/25/2016   LDLCALC 50 01/25/2016   TRIG 113 01/25/2016   CHOLHDL 2.3 01/25/2016   Labs dated 07/18/16: creatinine 1.52. Other chemistries and CBC normal.  Echo 02/17/16: Study Conclusions  - Left ventricle: The cavity size was moderately dilated. Wall   thickness was normal. Systolic function was moderately to   severely reduced. The estimated ejection fraction was in the   range of 30% to 35%. Akinesis of the mid-apicalanteroseptal   myocardium. Doppler parameters are consistent with abnormal left   ventricular relaxation (grade 1 diastolic dysfunction). - Aortic valve: Moderately to severely calcified annulus. Mildly   thickened, moderately calcified leaflets. There was moderate to   severe stenosis. There was mild regurgitation. Mean gradient (S):   36 mm Hg. Peak gradient (S): 60 mm Hg. Valve area (VTI): 1.23   cm^2. Valve area (Vmax): 1.28 cm^2. Valve area (Vmean): 1.31   cm^2. - Mitral valve: Mean gradient (D): 2 mm Hg. Valve area by   continuity equation (using LVOT flow): 3.11 cm^2. - Left atrium: The atrium was severely dilated. - Right atrium: The atrium was mildly dilated. - Pulmonary arteries: Systolic pressure was moderately increased.   PA peak pressure: 47 mm Hg (S).   Assessment / Plan:  1. Chronic  systolic HF - EF 84%- he appears to be doing well clinically. Continue sodium restriction. Continue current diuretic dose.  Not a candidate for beta blockers due to bradycardia. Not a candidate for ACEi due to hypotension and CKD. Intolerant of nitrates due to hypotension.  2. CAD -remote anterior MI and stent of LAD. Last cath April 2015 showed nonobstructive disease. No active symptoms.   3. Syncope - has loop recorder in place - no significant arrhythmia noted. Syncope most likely related to hypotension on Flomax.   4. CKD- stage 3.   5. AS -moderate to severe. No  symptoms. Repeat Echo at this time and compare to study last year.   6. Situational depression/anxiety  7. Hyperlipidemia. On lipitor. Labs followed by primary care.

## 2017-02-12 ENCOUNTER — Ambulatory Visit: Payer: Medicare Other | Admitting: Cardiology

## 2017-02-12 ENCOUNTER — Encounter: Payer: Self-pay | Admitting: Cardiology

## 2017-02-12 VITALS — BP 122/72 | HR 62 | Ht 70.0 in | Wt 186.8 lb

## 2017-02-12 DIAGNOSIS — I350 Nonrheumatic aortic (valve) stenosis: Secondary | ICD-10-CM | POA: Diagnosis not present

## 2017-02-12 DIAGNOSIS — I2510 Atherosclerotic heart disease of native coronary artery without angina pectoris: Secondary | ICD-10-CM | POA: Diagnosis not present

## 2017-02-12 DIAGNOSIS — I5022 Chronic systolic (congestive) heart failure: Secondary | ICD-10-CM | POA: Diagnosis not present

## 2017-02-12 DIAGNOSIS — I10 Essential (primary) hypertension: Secondary | ICD-10-CM

## 2017-02-12 MED ORDER — NITROGLYCERIN 0.4 MG SL SUBL: 0.4000 mg | SUBLINGUAL_TABLET | SUBLINGUAL | 3 refills | Status: DC | PRN

## 2017-02-12 NOTE — Patient Instructions (Signed)
Continue your current therapy  We will schedule you for an Echocardiogram   

## 2017-02-23 ENCOUNTER — Other Ambulatory Visit: Payer: Self-pay | Admitting: *Deleted

## 2017-02-23 DIAGNOSIS — I2510 Atherosclerotic heart disease of native coronary artery without angina pectoris: Secondary | ICD-10-CM

## 2017-02-23 MED ORDER — NITROGLYCERIN 0.4 MG SL SUBL: 0.4000 mg | SUBLINGUAL_TABLET | SUBLINGUAL | 3 refills | Status: DC | PRN

## 2017-02-26 ENCOUNTER — Other Ambulatory Visit (HOSPITAL_COMMUNITY): Payer: Medicare Other

## 2017-03-15 ENCOUNTER — Other Ambulatory Visit (HOSPITAL_COMMUNITY): Payer: Medicare Other

## 2017-03-15 ENCOUNTER — Ambulatory Visit (HOSPITAL_COMMUNITY): Payer: Medicare Other | Attending: Cardiovascular Disease

## 2017-03-15 ENCOUNTER — Other Ambulatory Visit: Payer: Self-pay

## 2017-03-15 DIAGNOSIS — I252 Old myocardial infarction: Secondary | ICD-10-CM | POA: Insufficient documentation

## 2017-03-15 DIAGNOSIS — I110 Hypertensive heart disease with heart failure: Secondary | ICD-10-CM | POA: Insufficient documentation

## 2017-03-15 DIAGNOSIS — I5022 Chronic systolic (congestive) heart failure: Secondary | ICD-10-CM | POA: Diagnosis not present

## 2017-03-15 DIAGNOSIS — I10 Essential (primary) hypertension: Secondary | ICD-10-CM | POA: Diagnosis not present

## 2017-03-15 DIAGNOSIS — I082 Rheumatic disorders of both aortic and tricuspid valves: Secondary | ICD-10-CM | POA: Insufficient documentation

## 2017-03-15 DIAGNOSIS — I4891 Unspecified atrial fibrillation: Secondary | ICD-10-CM | POA: Insufficient documentation

## 2017-03-15 DIAGNOSIS — I350 Nonrheumatic aortic (valve) stenosis: Secondary | ICD-10-CM

## 2017-03-15 DIAGNOSIS — I2510 Atherosclerotic heart disease of native coronary artery without angina pectoris: Secondary | ICD-10-CM | POA: Insufficient documentation

## 2017-03-23 ENCOUNTER — Encounter: Payer: Self-pay | Admitting: Cardiology

## 2017-03-23 ENCOUNTER — Ambulatory Visit: Payer: Medicare Other | Admitting: Cardiology

## 2017-03-23 VITALS — BP 138/68 | HR 72 | Ht 70.0 in | Wt 183.8 lb

## 2017-03-23 DIAGNOSIS — I350 Nonrheumatic aortic (valve) stenosis: Secondary | ICD-10-CM | POA: Diagnosis not present

## 2017-03-23 DIAGNOSIS — I5022 Chronic systolic (congestive) heart failure: Secondary | ICD-10-CM

## 2017-03-23 DIAGNOSIS — I5023 Acute on chronic systolic (congestive) heart failure: Secondary | ICD-10-CM

## 2017-03-23 DIAGNOSIS — I2510 Atherosclerotic heart disease of native coronary artery without angina pectoris: Secondary | ICD-10-CM

## 2017-03-23 NOTE — Progress Notes (Signed)
d  Jonathan Acevedo Date of Birth: [birth year] Medical Record #[id number]  History of Present Illness: [name] is seen for follow up of his recent Echo and severe AS.  He has a history of CAD with remote MI and prior stent to the LAD, HTN, CKD, AS and HLD. In April of 2014 a stress Myoview study showed extensive scar of the anterior apex and inferior wall. Ejection fraction is 44%. This actually represented an improvement from echocardiogram in 2012 at which time his ejection fraction was 35-40%. In Ocober 2014 he presented with a prolonged episode of chest pain. Echo showed no change in his moderate AS - medical management was continued.   He was admitted twice  in April 2015 with syncope that resulted in a fractured right ankle and acute systolic HF. Cardiac cath showed no obstructive disease and mild to moderate AS. He had a loop recorder placed. Syncope felt to be related to Flomax. Unable to take ACEi due to CKD and hypotension.  In July 2015 he was readmitted with acute on chronic CHF. Diuresed with good result. No recurrent syncope.   Echo November 2016 showed moderate AS with mean gradient 17 mm Hg. EF 30%. Repeat Echo in January 2018 showed stable EF of 30-35% with increase mean AV gradient to 36 mm Hg. His most recent Echo showed progression of AS with mean gradient of 40 mm Hg, worsening EF 25-30%, and worsening pulmonary Htn.   Last loop recorder evaluation on 07/06/16 showed normal device function and no Afib.   On follow up today he is doing OK. He denies any chest pain. He does have some  SOB with exertion.  No increased in edema. Weight is stable. No dizziness or syncope.  He is swimming 15-20 minutes 5x/week.   Current Outpatient Medications  Medication Sig Dispense Refill  . atorvastatin (LIPITOR) 40 MG tablet TAKE 1 TABLET BY MOUTH  DAILY 90 tablet 1  . calcium-vitamin D (OSCAL WITH D) 500-200 MG-UNIT tablet Take 1 tablet by mouth daily with breakfast.    . clonazePAM (KLONOPIN)  0.5 MG tablet Take 0.5 mg by mouth at bedtime.    . furosemide (LASIX) 40 MG tablet TAKE 1 TABLET BY MOUTH IN  THE MORNING AND 1/2 TABLET  IN THE EVENING MAY TAKE ONE MORE TABLET FOR WEIGHT GAIN OR INCREASE SWELLING 180 tablet 2  . nitroGLYCERIN (NITROSTAT) 0.4 MG SL tablet Place 1 tablet (0.4 mg total) under the tongue every 5 (five) minutes as needed for chest pain. 75 tablet 3  . potassium chloride SA (K-DUR,KLOR-CON) 20 MEQ tablet TAKE 1 TABLET BY MOUTH  DAILY 90 tablet 1  . traMADol (ULTRAM) 50 MG tablet Take 50-100 mg by mouth every 6 (six) hours as needed.    . vitamin B-12 (CYANOCOBALAMIN) 1000 MCG tablet Take 1,000 mcg by mouth every morning.     No current facility-administered medications for this visit.     Allergies  Allergen Reactions  . Ibandronic Acid Other (See Comments)    Muscle Aches-pt denies  . Risedronate Sodium Other (See Comments)    Muscle aches  . Flomax [Tamsulosin Hcl] Other (See Comments)    Syncope     Past Medical History:  Diagnosis Date  . Afib ([place])   . Aortic stenosis   . Arthritis   . Cataract   . CHF (congestive heart failure) ([place])   . Coronary artery disease    w remote anterior myocardial infarction (1998)  . GERD (gastroesophageal reflux  disease)   . Hypercalcemia 12/26/2011  . Hypercholesteremia   . Hypertension   . Inguinal hernia    right  . Kidney stones   . Myocardial infarct, old    3 times   . 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 Rectal polyp   . Renal insufficiency   . Syncope 05/18/2013  . Wears glasses     Past Surgical History:  Procedure Laterality Date  . CARDIAC CATHETERIZATION  08/18/2008   [place] 20, LAD stent 20 ISR, CFX 10, RCA 20, EF 45  . CATARACT EXTRACTION    . COLONOSCOPY W/ BIOPSIES AND POLYPECTOMY    . CORONARY STENT PLACEMENT  07/19/1996   Proximal LAD  . ELECTROPHYSIOLOGY STUDY  05-19-13   EPS for syncope without inducible arrhythmias by Dr Lovena Le  . ELECTROPHYSIOLOGY STUDY N/A 05/19/2013    Procedure: ELECTROPHYSIOLOGY STUDY;  Surgeon: Evans Lance, MD;  Location: [facility] CATH LAB;  Service: Cardiovascular;  Laterality: N/A;  . FRACTURE SURGERY     right hip  . INGUINAL HERNIA REPAIR     left  . INGUINAL HERNIA REPAIR Right 02/09/2015   Procedure: RIGHT INGUINAL HERNIA REPAIR W/ MESH;  Surgeon: Jackolyn Confer, MD;  Location: [place];  Service: General;  Laterality: Right;  inguinal hernia  . INSERTION OF MESH Right 02/09/2015   Procedure: INSERTION OF MESH to right inguinal hernia;  Surgeon: Jackolyn Confer, MD;  Location: [place];  Service: General;  Laterality: Right;  . LEFT HEART CATHETERIZATION WITH CORONARY ANGIOGRAM N/A 05/19/2013   Procedure: LEFT HEART CATHETERIZATION WITH CORONARY ANGIOGRAM;  Surgeon: Peter M Martinique, MD;  Location: [facility] CATH LAB;  Service: Cardiovascular;  Laterality: N/A;  . LOOP RECORDER IMPLANT  05-19-13   MDT LinQ implanted by Dr Lovena Le for syncope following negative EPS  . ORIF FEMUR FRACTURE     right  . parathyroidectomy    . UMBILICAL HERNIA REPAIR      Social History   Tobacco Use  Smoking Status Former Smoker  . Packs/day: 1.00  . Years: 45.00  . Pack years: 45.00  . Types: Cigarettes  . Last attempt to quit: 07/19/1996  . Years since quitting: 20.6  Smokeless Tobacco Never Used    Social History   Substance and Sexual Activity  Alcohol Use No    No family history on file.  Review of Systems: The review of systems is per the HPI.  All other systems were reviewed and are negative.  Physical Exam: There were no vitals taken for this visit. GENERAL:  Well appearing HEENT:  PERRL, EOMI, sclera are clear. Oropharynx is clear. NECK:  No jugular venous distention, carotid upstroke brisk and symmetric, no bruits, no thyromegaly or adenopathy LUNGS:  Clear to auscultation bilaterally CHEST:  Unremarkable HEART:  RRR,  PMI not displaced or sustained,S1 and S2 within normal limits, no S3, no S4: no clicks, no rubs, There is a gr 3/6 systolic  murmur RUSB radiating across the precordium.  ABD:  Soft, nontender. BS +, no masses or bruits. No hepatomegaly, no splenomegaly EXT:  2 + pulses throughout, no edema, no cyanosis no clubbing SKIN:  Warm and dry.  No rashes NEURO:  Alert and oriented x 3. Cranial nerves II through XII intact. PSYCH:  Cognitively intact      Wt Readings from Last 3 Encounters:  02/12/17 186 lb 12.8 oz (84.7 kg)  07/27/16 184 lb (83.5 kg)  01/25/16 194 lb 9.6 oz (88.3 kg)     LABORATORY DATA:  Lab Results  Component Value Date   CHOL 128 01/25/2016   HDL 55 01/25/2016   LDLCALC 50 01/25/2016   TRIG 113 01/25/2016   CHOLHDL 2.3 01/25/2016   Labs dated 07/18/16: creatinine 1.52. Other chemistries and CBC normal.  Echo 02/17/16: Study Conclusions  - Left ventricle: The cavity size was moderately dilated. Wall   thickness was normal. Systolic function was moderately to   severely reduced. The estimated ejection fraction was in the   range of 30% to 35%. Akinesis of the mid-apicalanteroseptal   myocardium. Doppler parameters are consistent with abnormal left   ventricular relaxation (grade 1 diastolic dysfunction). - Aortic valve: Moderately to severely calcified annulus. Mildly   thickened, moderately calcified leaflets. There was moderate to   severe stenosis. There was mild regurgitation. Mean gradient (S):   36 mm Hg. Peak gradient (S): 60 mm Hg. Valve area (VTI): 1.23   cm^2. Valve area (Vmax): 1.28 cm^2. Valve area (Vmean): 1.31   cm^2. - Mitral valve: Mean gradient (D): 2 mm Hg. Valve area by   continuity equation (using LVOT flow): 3.11 cm^2. - Left atrium: The atrium was severely dilated. - Right atrium: The atrium was mildly dilated. - Pulmonary arteries: Systolic pressure was moderately increased.   PA peak pressure: 47 mm Hg (S).  Echo 03/15/17: Study Conclusions  - Left ventricle: The cavity size was mildly dilated. There was   moderate concentric hypertrophy. Systolic  function was severely   reduced. The estimated ejection fraction was in the range of 25%   to 30%. Diffuse hypokinesis with akinesis of the mid-apical   anteroseptal myocardium. Doppler parameters are consistent with a   reversible restrictive pattern, indicative of decreased left   ventricular diastolic compliance and/or increased left atrial   pressure (grade 3 diastolic dysfunction). Doppler parameters are   consistent with high ventricular filling pressure. - Aortic valve: Valve mobility was restricted. There was severe   stenosis. There was mild regurgitation. - Aorta: Ascending aortic diameter: 37 mm (S). - Mitral valve: Severely calcified annulus, mostly posterior.   Mobility of the posterior leaflet was restricted. Transvalvular   velocity was within the normal range. There was no evidence for   stenosis. There was trivial regurgitation. - Left atrium: The atrium was severely dilated. - Right ventricle: The cavity size was mildly dilated. Wall   thickness was normal. Systolic function was normal. - Right atrium: The atrium was mildly dilated. - Tricuspid valve: There was mild regurgitation. - Pulmonary arteries: Systolic pressure was severely increased. PA   peak pressure: 82 mm Hg (S).  ------------------------------------------------------------------- Study data:   Study status:  Routine.  Procedure:  The patient reported no pain pre or post test. Transthoracic echocardiography for left ventricular function evaluation, for right ventricular function evaluation, and for assessment of valvular function. Image quality was adequate.  Study completion:  There were no complications.          Echocardiography.  M-mode, complete 2D, spectral Doppler, and color Doppler.  Birthdate:  Patient birthdate: 1928/10/11.  Age:  Patient is 84 yr old.  Sex:  Gender: male.    BMI: 26.8 kg/m^2.  Blood pressure:     122/72  Patient status:  Outpatient.  Study date:  Study date: 03/15/2017.  Study time: 11:53 AM.  Location:  [facility]  -------------------------------------------------------------------  ------------------------------------------------------------------- Left ventricle:  The cavity size was mildly dilated. There was moderate concentric hypertrophy. Systolic function was severely reduced. The estimated ejection fraction was in the  range of 25% to 30%.  Regional wall motion abnormalities:  Diffuse hypokinesis with akinesis of the mid-apical anteroseptal myocardium. Doppler parameters are consistent with a reversible restrictive pattern, indicative of decreased left ventricular diastolic compliance and/or increased left atrial pressure (grade 3 diastolic dysfunction). Doppler parameters are consistent with high ventricular filling pressure.  ------------------------------------------------------------------- Aortic valve:   Trileaflet; severely thickened, severely calcified leaflets. Valve mobility was restricted.  Doppler:   There was severe stenosis.   There was mild regurgitation.    VTI ratio of LVOT to aortic valve: 0.25. Valve area (VTI): 0.85 cm^2. Indexed valve area (VTI): 0.41 cm^2/m^2. Peak velocity ratio of LVOT to aortic valve: 0.25. Valve area (Vmax): 0.86 cm^2. Indexed valve area (Vmax): 0.42 cm^2/m^2. Mean velocity ratio of LVOT to aortic valve: 0.25. Valve area (Vmean): 0.87 cm^2. Indexed valve area (Vmean): 0.42 cm^2/m^2.    Mean gradient (S): 40 mm Hg. Peak gradient (S): 62 mm Hg.  ------------------------------------------------------------------- Aorta:  Aortic root: The aortic root was normal in size. Ascending aorta: The ascending aorta was mildly dilated.  ------------------------------------------------------------------- Mitral valve:  Severely calcified annulus, mostly posterior. Mobility of the posterior leaflet was restricted.  Doppler: Transvalvular velocity was within the normal range. There was no evidence for  stenosis. There was trivial regurgitation.    Peak gradient (D): 7 mm Hg.  ------------------------------------------------------------------- Left atrium:  The atrium was severely dilated.  ------------------------------------------------------------------- Right ventricle:  The cavity size was mildly dilated. Wall thickness was normal. Systolic function was normal.  ------------------------------------------------------------------- Pulmonic valve:    Doppler:  Transvalvular velocity was within the normal range. There was no evidence for stenosis.  ------------------------------------------------------------------- Tricuspid valve:   Structurally normal valve.    Doppler: Transvalvular velocity was within the normal range. There was mild regurgitation.  ------------------------------------------------------------------- Pulmonary artery:   The main pulmonary artery was normal-sized. Systolic pressure was severely increased.  ------------------------------------------------------------------- Right atrium:  The atrium was mildly dilated.  ------------------------------------------------------------------- Pericardium:  There was no pericardial effusion.  ------------------------------------------------------------------- Systemic veins: Inferior vena cava: The vessel was dilated. The respirophasic diameter changes were blunted (< 50%), consistent with elevated central venous pressure.  ------------------------------------------------------------------- Measurements   Left ventricle                            Value          Reference  LV ID, ED, PLAX chordal           (H)     54.5  mm       43 - 52  LV ID, ES, PLAX chordal           (H)     42.7  mm       23 - 38  LV fx shortening, PLAX chordal    (L)     22    %        >=29  LV PW thickness, ED                       14.1  mm       ---------  IVS/LV PW ratio, ED                       0.97           <=1.3  Stroke  volume, 2D  78    ml       ---------  Stroke volume/bsa, 2D                     38    ml/m^2   ---------  LV e&', lateral                            6.58  cm/s     ---------  LV E/e&', lateral                          19.6           ---------  LV e&', medial                             5.37  cm/s     ---------  LV E/e&', medial                           24.02          ---------  LV e&', average                            5.98  cm/s     ---------  LV E/e&', average                          21.59          ---------    Ventricular septum                        Value          Reference  IVS thickness, ED                         13.7  mm       ---------    LVOT                                      Value          Reference  LVOT ID, S                                21    mm       ---------  LVOT area                                 3.46  cm^2     ---------  LVOT ID                                   21    mm       ---------  LVOT peak velocity, S                     97.7  cm/s     ---------  LVOT mean velocity, S  76.2  cm/s     ---------  LVOT VTI, S                               22.5  cm       ---------  LVOT peak gradient, S                     4     mm Hg    ---------  Stroke volume (SV), LVOT DP               77.9  ml       ---------  Stroke index (SV/bsa), LVOT DP            37.8  ml/m^2   ---------    Aortic valve                              Value          Reference  Aortic valve peak velocity, S             392   cm/s     ---------  Aortic valve mean velocity, S             303   cm/s     ---------  Aortic valve VTI, S                       91.4  cm       ---------  Aortic mean gradient, S                   40    mm Hg    ---------  Aortic peak gradient, S                   62    mm Hg    ---------  VTI ratio, LVOT/AV                        0.25           ---------  Aortic valve area, VTI                    0.85  cm^2     ---------  Aortic  valve area/bsa, VTI                0.41  cm^2/m^2 ---------  Velocity ratio, peak, LVOT/AV             0.25           ---------  Aortic valve area, peak velocity          0.86  cm^2     ---------  Aortic valve area/bsa, peak               0.42  cm^2/m^2 ---------  velocity  Velocity ratio, mean, LVOT/AV             0.25           ---------  Aortic valve area, mean velocity          0.87  cm^2     ---------  Aortic valve area/bsa, mean               0.42  cm^2/m^2 ---------  velocity    Aorta  Value          Reference  Aortic root ID, ED                        35    mm       ---------  Ascending aorta ID, A-P, S                37    mm       ---------    Left atrium                               Value          Reference  LA ID, A-P, ES                            59    mm       ---------  LA ID/bsa, A-P                    (H)     2.86  cm/m^2   <=2.2  LA volume, S                              144   ml       ---------  LA volume/bsa, S                          69.9  ml/m^2   ---------  LA volume, ES, 1-p A4C                    117   ml       ---------  LA volume/bsa, ES, 1-p A4C                56.8  ml/m^2   ---------  LA volume, ES, 1-p A2C                    173   ml       ---------  LA volume/bsa, ES, 1-p A2C                84    ml/m^2   ---------    Mitral valve                              Value          Reference  Mitral E-wave peak velocity               129   cm/s     ---------  Mitral A-wave peak velocity               61.4  cm/s     ---------  Mitral deceleration time                  155   ms       150 - 230  Mitral peak gradient, D                   7     mm Hg    ---------  Mitral E/A ratio, peak  2.1            ---------    Pulmonary arteries                        Value          Reference  PA pressure, S, DP                (H)     82    mm Hg    <=30    Tricuspid valve                           Value           Reference  Tricuspid regurg peak velocity            408   cm/s     ---------  Tricuspid peak RV-RA gradient             67    mm Hg    ---------    Systemic veins                            Value          Reference  Estimated CVP                             15    mm Hg    ---------    Right ventricle                           Value          Reference  RV pressure, S, DP                (H)     82    mm Hg    <=30  RV s&', lateral, S                         14.3  cm/s     ---------  Legend: (L)  and  (H)  mark values outside specified reference range.  ------------------------------------------------------------------- Prepared and Electronically Authenticated by  Skeet Latch, MD 2019-02-07T15:50:49   Assessment / Plan:  1. Chronic systolic HF - EF 29-56%-OZHY is down from baseline probably related to progressive AS. He appears to be doing well clinically. Continue sodium restriction. Continue current diuretic dose.  Not a candidate for beta blockers due to bradycardia. Not a candidate for ACEi due to hypotension and CKD. Intolerant of nitrates due to hypotension.  2. CAD -remote anterior MI and stent of LAD. Last cath April 2015 showed nonobstructive disease. No active symptoms.   3. Severe Aortic stenosis. Based on recent Echo gradient has increased, EF has decreased and he has more pulmonary HTN. Recommend evaluation with [facility]. Then refer to valve team for consideration of TAVR.    4. CKD- stage 3. Will update lab work   5. Syncope. Most likely related to orthostasis on Flomax. Loop recorder without significant arrhythmia.   6. Situational depression/anxiety  7. Hyperlipidemia. On lipitor. Will update labs.

## 2017-03-23 NOTE — Patient Instructions (Addendum)
   [facility] [address] [place] [address] Dept: [phone] Loc: [place]  03/23/2017  You are scheduled for a right and left cardiac cath on Friday 04/06/17 with Dr. Martinique.  1. Please arrive at the [facility] (Main Entrance A) at [facility]: [address] [place], [place] [address] at 8:00 am (two hours before your procedure to ensure your preparation). Free valet parking service is available.   Special note: Every effort is made to have your procedure done on time. Please understand that emergencies sometimes delay scheduled procedures.  2. Diet: NOTHING TO EAT OR DRINK AFTER MIDNIGHT Thursday night 04/05/17  3. Labs: Friday 03/30/17  4. Medication instructions in preparation for your procedure:      HOLD LASIX MORNING OF CATH     On the morning of your procedure, take your ASPIRIN and any morning medicines NOT listed above.  You may use sips of water.  5. Plan for one night stay--bring personal belongings. 6. Bring a current list of your medications and current insurance cards. 7. You MUST have a responsible person to drive you home. 8. Someone MUST be with you the first 24 hours after you arrive home or your discharge will be delayed. 9. Please wear clothes that are easy to get on and off and wear slip-on shoes.  Thank you for allowing [place] to care for you!   -- [place] [facility]

## 2017-03-23 NOTE — H&P (View-Only) (Signed)
d  Jonathan Acevedo Date of Birth: 08-Oct-1928 Medical Record #[id number]  History of Present Illness: Jonathan Acevedo is seen for follow up of his recent Echo and severe AS.  He has a history of CAD with remote MI and prior stent to the LAD, HTN, CKD, AS and HLD. In April of 2014 a stress Myoview study showed extensive scar of the anterior apex and inferior wall. Ejection fraction is 44%. This actually represented an improvement from echocardiogram in 2012 at which time his ejection fraction was 35-40%. In Ocober 2014 he presented with a prolonged episode of chest pain. Echo showed no change in his moderate AS - medical management was continued.   He was admitted twice  in April 2015 with syncope that resulted in a fractured right ankle and acute systolic HF. Cardiac cath showed no obstructive disease and mild to moderate AS. He had a loop recorder placed. Syncope felt to be related to Flomax. Unable to take ACEi due to CKD and hypotension.  In July 2015 he was readmitted with acute on chronic CHF. Diuresed with good result. No recurrent syncope.   Echo November 2016 showed moderate AS with mean gradient 17 mm Hg. EF 30%. Repeat Echo in January 2018 showed stable EF of 30-35% with increase mean AV gradient to 36 mm Hg. His most recent Echo showed progression of AS with mean gradient of 40 mm Hg, worsening EF 25-30%, and worsening pulmonary Htn.   Last loop recorder evaluation on 07/06/16 showed normal device function and no Afib.   On follow up today he is doing OK. He denies any chest pain. He does have some  SOB with exertion.  No increased in edema. Weight is stable. No dizziness or syncope.  He is swimming 15-20 minutes 5x/week.   Current Outpatient Medications  Medication Sig Dispense Refill  . atorvastatin (LIPITOR) 40 MG tablet TAKE 1 TABLET BY MOUTH  DAILY 90 tablet 1  . calcium-vitamin D (OSCAL WITH D) 500-200 MG-UNIT tablet Take 1 tablet by mouth daily with breakfast.    . clonazePAM (KLONOPIN)  0.5 MG tablet Take 0.5 mg by mouth at bedtime.    . furosemide (LASIX) 40 MG tablet TAKE 1 TABLET BY MOUTH IN  THE MORNING AND 1/2 TABLET  IN THE EVENING MAY TAKE ONE MORE TABLET FOR WEIGHT GAIN OR INCREASE SWELLING 180 tablet 2  . nitroGLYCERIN (NITROSTAT) 0.4 MG SL tablet Place 1 tablet (0.4 mg total) under the tongue every 5 (five) minutes as needed for chest pain. 75 tablet 3  . potassium chloride SA (K-DUR,KLOR-CON) 20 MEQ tablet TAKE 1 TABLET BY MOUTH  DAILY 90 tablet 1  . traMADol (ULTRAM) 50 MG tablet Take 50-100 mg by mouth every 6 (six) hours as needed.    . vitamin B-12 (CYANOCOBALAMIN) 1000 MCG tablet Take 1,000 mcg by mouth every morning.     No current facility-administered medications for this visit.     Allergies  Allergen Reactions  . Ibandronic Acid Other (See Comments)    Muscle Aches-pt denies  . Risedronate Sodium Other (See Comments)    Muscle aches  . Flomax [Tamsulosin Hcl] Other (See Comments)    Syncope     Past Medical History:  Diagnosis Date  . Afib ([place])   . Aortic stenosis   . Arthritis   . Cataract   . CHF (congestive heart failure) ([place])   . Coronary artery disease    w remote anterior myocardial infarction (1998)  . GERD (gastroesophageal reflux  disease)   . Hypercalcemia 12/26/2011  . Hypercholesteremia   . Hypertension   . Inguinal hernia    right  . Kidney stones   . Myocardial infarct, old    3 times   . 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 Rectal polyp   . Renal insufficiency   . Syncope 05/18/2013  . Wears glasses     Past Surgical History:  Procedure Laterality Date  . CARDIAC CATHETERIZATION  08/18/2008   [place] 20, LAD stent 20 ISR, CFX 10, RCA 20, EF 45  . CATARACT EXTRACTION    . COLONOSCOPY W/ BIOPSIES AND POLYPECTOMY    . CORONARY STENT PLACEMENT  07/19/1996   Proximal LAD  . ELECTROPHYSIOLOGY STUDY  05-19-13   EPS for syncope without inducible arrhythmias by Dr Lovena Le  . ELECTROPHYSIOLOGY STUDY N/A 05/19/2013    Procedure: ELECTROPHYSIOLOGY STUDY;  Surgeon: Evans Lance, MD;  Location: [facility] CATH LAB;  Service: Cardiovascular;  Laterality: N/A;  . FRACTURE SURGERY     right hip  . INGUINAL HERNIA REPAIR     left  . INGUINAL HERNIA REPAIR Right 02/09/2015   Procedure: RIGHT INGUINAL HERNIA REPAIR W/ MESH;  Surgeon: Jackolyn Confer, MD;  Location: [place];  Service: General;  Laterality: Right;  inguinal hernia  . INSERTION OF MESH Right 02/09/2015   Procedure: INSERTION OF MESH to right inguinal hernia;  Surgeon: Jackolyn Confer, MD;  Location: [place];  Service: General;  Laterality: Right;  . LEFT HEART CATHETERIZATION WITH CORONARY ANGIOGRAM N/A 05/19/2013   Procedure: LEFT HEART CATHETERIZATION WITH CORONARY ANGIOGRAM;  Surgeon: Willis Kuipers M [place], MD;  Location: [facility] CATH LAB;  Service: Cardiovascular;  Laterality: N/A;  . LOOP RECORDER IMPLANT  05-19-13   MDT LinQ implanted by Dr Lovena Le for syncope following negative EPS  . ORIF FEMUR FRACTURE     right  . parathyroidectomy    . UMBILICAL HERNIA REPAIR      Social History   Tobacco Use  Smoking Status Former Smoker  . Packs/day: 1.00  . Years: 45.00  . Pack years: 45.00  . Types: Cigarettes  . Last attempt to quit: 07/19/1996  . Years since quitting: 20.6  Smokeless Tobacco Never Used    Social History   Substance and Sexual Activity  Alcohol Use No    No family history on file.  Review of Systems: The review of systems is per the HPI.  All other systems were reviewed and are negative.  Physical Exam: There were no vitals taken for this visit. GENERAL:  Well appearing HEENT:  PERRL, EOMI, sclera are clear. Oropharynx is clear. NECK:  No jugular venous distention, carotid upstroke brisk and symmetric, no bruits, no thyromegaly or adenopathy LUNGS:  Clear to auscultation bilaterally CHEST:  Unremarkable HEART:  RRR,  PMI not displaced or sustained,S1 and S2 within normal limits, no S3, no S4: no clicks, no rubs, There is a gr 3/6 systolic  murmur RUSB radiating across the precordium.  ABD:  Soft, nontender. BS +, no masses or bruits. No hepatomegaly, no splenomegaly EXT:  2 + pulses throughout, no edema, no cyanosis no clubbing SKIN:  Warm and dry.  No rashes NEURO:  Alert and oriented x 3. Cranial nerves II through XII intact. PSYCH:  Cognitively intact      Wt Readings from Last 3 Encounters:  02/12/17 186 lb 12.8 oz (84.7 kg)  07/27/16 184 lb (83.5 kg)  01/25/16 194 lb 9.6 oz (88.3 kg)     LABORATORY DATA:  Lab Results  Component Value Date   CHOL 128 01/25/2016   HDL 55 01/25/2016   LDLCALC 50 01/25/2016   TRIG 113 01/25/2016   CHOLHDL 2.3 01/25/2016   Labs dated 07/18/16: creatinine 1.52. Other chemistries and CBC normal.  Echo 02/17/16: Study Conclusions  - Left ventricle: The cavity size was moderately dilated. Wall   thickness was normal. Systolic function was moderately to   severely reduced. The estimated ejection fraction was in the   range of 30% to 35%. Akinesis of the mid-apicalanteroseptal   myocardium. Doppler parameters are consistent with abnormal left   ventricular relaxation (grade 1 diastolic dysfunction). - Aortic valve: Moderately to severely calcified annulus. Mildly   thickened, moderately calcified leaflets. There was moderate to   severe stenosis. There was mild regurgitation. Mean gradient (S):   36 mm Hg. Peak gradient (S): 60 mm Hg. Valve area (VTI): 1.23   cm^2. Valve area (Vmax): 1.28 cm^2. Valve area (Vmean): 1.31   cm^2. - Mitral valve: Mean gradient (D): 2 mm Hg. Valve area by   continuity equation (using LVOT flow): 3.11 cm^2. - Left atrium: The atrium was severely dilated. - Right atrium: The atrium was mildly dilated. - Pulmonary arteries: Systolic pressure was moderately increased.   PA peak pressure: 47 mm Hg (S).  Echo 03/15/17: Study Conclusions  - Left ventricle: The cavity size was mildly dilated. There was   moderate concentric hypertrophy. Systolic  function was severely   reduced. The estimated ejection fraction was in the range of 25%   to 30%. Diffuse hypokinesis with akinesis of the mid-apical   anteroseptal myocardium. Doppler parameters are consistent with a   reversible restrictive pattern, indicative of decreased left   ventricular diastolic compliance and/or increased left atrial   pressure (grade 3 diastolic dysfunction). Doppler parameters are   consistent with high ventricular filling pressure. - Aortic valve: Valve mobility was restricted. There was severe   stenosis. There was mild regurgitation. - Aorta: Ascending aortic diameter: 37 mm (S). - Mitral valve: Severely calcified annulus, mostly posterior.   Mobility of the posterior leaflet was restricted. Transvalvular   velocity was within the normal range. There was no evidence for   stenosis. There was trivial regurgitation. - Left atrium: The atrium was severely dilated. - Right ventricle: The cavity size was mildly dilated. Wall   thickness was normal. Systolic function was normal. - Right atrium: The atrium was mildly dilated. - Tricuspid valve: There was mild regurgitation. - Pulmonary arteries: Systolic pressure was severely increased. PA   peak pressure: 82 mm Hg (S).  ------------------------------------------------------------------- Study data:   Study status:  Routine.  Procedure:  The patient reported no pain pre or post test. Transthoracic echocardiography for left ventricular function evaluation, for right ventricular function evaluation, and for assessment of valvular function. Image quality was adequate.  Study completion:  There were no complications.          Echocardiography.  M-mode, complete 2D, spectral Doppler, and color Doppler.  Birthdate:  Patient birthdate: 29-Oct-1928.  Age:  Patient is 84 yr old.  Sex:  Gender: male.    BMI: 26.8 kg/m^2.  Blood pressure:     122/72  Patient status:  Outpatient.  Study date:  Study date: 03/15/2017.  Study time: 11:53 AM.  Location:  [facility]  -------------------------------------------------------------------  ------------------------------------------------------------------- Left ventricle:  The cavity size was mildly dilated. There was moderate concentric hypertrophy. Systolic function was severely reduced. The estimated ejection fraction was in the  range of 25% to 30%.  Regional wall motion abnormalities:  Diffuse hypokinesis with akinesis of the mid-apical anteroseptal myocardium. Doppler parameters are consistent with a reversible restrictive pattern, indicative of decreased left ventricular diastolic compliance and/or increased left atrial pressure (grade 3 diastolic dysfunction). Doppler parameters are consistent with high ventricular filling pressure.  ------------------------------------------------------------------- Aortic valve:   Trileaflet; severely thickened, severely calcified leaflets. Valve mobility was restricted.  Doppler:   There was severe stenosis.   There was mild regurgitation.    VTI ratio of LVOT to aortic valve: 0.25. Valve area (VTI): 0.85 cm^2. Indexed valve area (VTI): 0.41 cm^2/m^2. Peak velocity ratio of LVOT to aortic valve: 0.25. Valve area (Vmax): 0.86 cm^2. Indexed valve area (Vmax): 0.42 cm^2/m^2. Mean velocity ratio of LVOT to aortic valve: 0.25. Valve area (Vmean): 0.87 cm^2. Indexed valve area (Vmean): 0.42 cm^2/m^2.    Mean gradient (S): 40 mm Hg. Peak gradient (S): 62 mm Hg.  ------------------------------------------------------------------- Aorta:  Aortic root: The aortic root was normal in size. Ascending aorta: The ascending aorta was mildly dilated.  ------------------------------------------------------------------- Mitral valve:  Severely calcified annulus, mostly posterior. Mobility of the posterior leaflet was restricted.  Doppler: Transvalvular velocity was within the normal range. There was no evidence for  stenosis. There was trivial regurgitation.    Peak gradient (D): 7 mm Hg.  ------------------------------------------------------------------- Left atrium:  The atrium was severely dilated.  ------------------------------------------------------------------- Right ventricle:  The cavity size was mildly dilated. Wall thickness was normal. Systolic function was normal.  ------------------------------------------------------------------- Pulmonic valve:    Doppler:  Transvalvular velocity was within the normal range. There was no evidence for stenosis.  ------------------------------------------------------------------- Tricuspid valve:   Structurally normal valve.    Doppler: Transvalvular velocity was within the normal range. There was mild regurgitation.  ------------------------------------------------------------------- Pulmonary artery:   The main pulmonary artery was normal-sized. Systolic pressure was severely increased.  ------------------------------------------------------------------- Right atrium:  The atrium was mildly dilated.  ------------------------------------------------------------------- Pericardium:  There was no pericardial effusion.  ------------------------------------------------------------------- Systemic veins: Inferior vena cava: The vessel was dilated. The respirophasic diameter changes were blunted (< 50%), consistent with elevated central venous pressure.  ------------------------------------------------------------------- Measurements   Left ventricle                            Value          Reference  LV ID, ED, PLAX chordal           (H)     54.5  mm       43 - 52  LV ID, ES, PLAX chordal           (H)     42.7  mm       23 - 38  LV fx shortening, PLAX chordal    (L)     22    %        >=29  LV PW thickness, ED                       14.1  mm       ---------  IVS/LV PW ratio, ED                       0.97           <=1.3  Stroke  volume, 2D  78    ml       ---------  Stroke volume/bsa, 2D                     38    ml/m^2   ---------  LV e&', lateral                            6.58  cm/s     ---------  LV E/e&', lateral                          19.6           ---------  LV e&', medial                             5.37  cm/s     ---------  LV E/e&', medial                           24.02          ---------  LV e&', average                            5.98  cm/s     ---------  LV E/e&', average                          21.59          ---------    Ventricular septum                        Value          Reference  IVS thickness, ED                         13.7  mm       ---------    LVOT                                      Value          Reference  LVOT ID, S                                21    mm       ---------  LVOT area                                 3.46  cm^2     ---------  LVOT ID                                   21    mm       ---------  LVOT peak velocity, S                     97.7  cm/s     ---------  LVOT mean velocity, S  76.2  cm/s     ---------  LVOT VTI, S                               22.5  cm       ---------  LVOT peak gradient, S                     4     mm Hg    ---------  Stroke volume (SV), LVOT DP               77.9  ml       ---------  Stroke index (SV/bsa), LVOT DP            37.8  ml/m^2   ---------    Aortic valve                              Value          Reference  Aortic valve peak velocity, S             392   cm/s     ---------  Aortic valve mean velocity, S             303   cm/s     ---------  Aortic valve VTI, S                       91.4  cm       ---------  Aortic mean gradient, S                   40    mm Hg    ---------  Aortic peak gradient, S                   62    mm Hg    ---------  VTI ratio, LVOT/AV                        0.25           ---------  Aortic valve area, VTI                    0.85  cm^2     ---------  Aortic  valve area/bsa, VTI                0.41  cm^2/m^2 ---------  Velocity ratio, peak, LVOT/AV             0.25           ---------  Aortic valve area, peak velocity          0.86  cm^2     ---------  Aortic valve area/bsa, peak               0.42  cm^2/m^2 ---------  velocity  Velocity ratio, mean, LVOT/AV             0.25           ---------  Aortic valve area, mean velocity          0.87  cm^2     ---------  Aortic valve area/bsa, mean               0.42  cm^2/m^2 ---------  velocity    Aorta  Value          Reference  Aortic root ID, ED                        35    mm       ---------  Ascending aorta ID, A-P, S                37    mm       ---------    Left atrium                               Value          Reference  LA ID, A-P, ES                            59    mm       ---------  LA ID/bsa, A-P                    (H)     2.86  cm/m^2   <=2.2  LA volume, S                              144   ml       ---------  LA volume/bsa, S                          69.9  ml/m^2   ---------  LA volume, ES, 1-p A4C                    117   ml       ---------  LA volume/bsa, ES, 1-p A4C                56.8  ml/m^2   ---------  LA volume, ES, 1-p A2C                    173   ml       ---------  LA volume/bsa, ES, 1-p A2C                84    ml/m^2   ---------    Mitral valve                              Value          Reference  Mitral E-wave peak velocity               129   cm/s     ---------  Mitral A-wave peak velocity               61.4  cm/s     ---------  Mitral deceleration time                  155   ms       150 - 230  Mitral peak gradient, D                   7     mm Hg    ---------  Mitral E/A ratio, peak  2.1            ---------    Pulmonary arteries                        Value          Reference  PA pressure, S, DP                (H)     82    mm Hg    <=30    Tricuspid valve                           Value           Reference  Tricuspid regurg peak velocity            408   cm/s     ---------  Tricuspid peak RV-RA gradient             67    mm Hg    ---------    Systemic veins                            Value          Reference  Estimated CVP                             15    mm Hg    ---------    Right ventricle                           Value          Reference  RV pressure, S, DP                (H)     82    mm Hg    <=30  RV s&', lateral, S                         14.3  cm/s     ---------  Legend: (L)  and  (H)  mark values outside specified reference range.  ------------------------------------------------------------------- Prepared and Electronically Authenticated by  Skeet Latch, MD 2019-02-07T15:50:49   Assessment / Plan:  1. Chronic systolic HF - EF 76-28%-BTDV is down from baseline probably related to progressive AS. He appears to be doing well clinically. Continue sodium restriction. Continue current diuretic dose.  Not a candidate for beta blockers due to bradycardia. Not a candidate for ACEi due to hypotension and CKD. Intolerant of nitrates due to hypotension.  2. CAD -remote anterior MI and stent of LAD. Last cath April 2015 showed nonobstructive disease. No active symptoms.   3. Severe Aortic stenosis. Based on recent Echo gradient has increased, EF has decreased and he has more pulmonary HTN. Recommend evaluation with [facility]. Then refer to valve team for consideration of TAVR.    4. CKD- stage 3. Will update lab work   5. Syncope. Most likely related to orthostasis on Flomax. Loop recorder without significant arrhythmia.   6. Situational depression/anxiety  7. Hyperlipidemia. On lipitor. Will update labs.

## 2017-03-29 ENCOUNTER — Other Ambulatory Visit: Payer: Self-pay | Admitting: Cardiology

## 2017-03-29 DIAGNOSIS — I350 Nonrheumatic aortic (valve) stenosis: Secondary | ICD-10-CM

## 2017-03-30 ENCOUNTER — Telehealth: Payer: Self-pay | Admitting: Cardiology

## 2017-03-30 LAB — BASIC METABOLIC PANEL
BUN/Creatinine Ratio: 20 (ref 10–24)
BUN: 30 mg/dL — AB (ref 8–27)
CALCIUM: 9.5 mg/dL (ref 8.6–10.2)
CO2: 24 mmol/L (ref 20–29)
CREATININE: 1.52 mg/dL — AB (ref 0.76–1.27)
Chloride: 103 mmol/L (ref 96–106)
GFR calc Af Amer: 47 mL/min/{1.73_m2} — ABNORMAL LOW (ref >59)
GFR, EST NON AFRICAN AMERICAN: 41 mL/min/{1.73_m2} — AB (ref >59)
Glucose: 88 mg/dL (ref 65–99)
Potassium: 4.3 mmol/L (ref 3.5–5.2)
Sodium: 143 mmol/L (ref 134–144)

## 2017-03-30 LAB — CBC WITH DIFFERENTIAL/PLATELET
BASOS: 1 %
Basophils Absolute: 0 10*3/uL (ref 0.0–0.2)
EOS (ABSOLUTE): 0.2 10*3/uL (ref 0.0–0.4)
EOS: 4 %
HEMATOCRIT: 37 % — AB (ref 37.5–51.0)
Hemoglobin: 12.4 g/dL — ABNORMAL LOW (ref 13.0–17.7)
IMMATURE GRANS (ABS): 0 10*3/uL (ref 0.0–0.1)
Immature Granulocytes: 0 %
Lymphocytes Absolute: 1.6 10*3/uL (ref 0.7–3.1)
Lymphs: 27 %
MCH: 30.3 pg (ref 26.6–33.0)
MCHC: 33.5 g/dL (ref 31.5–35.7)
MCV: 91 fL (ref 79–97)
MONOCYTES: 12 %
Monocytes Absolute: 0.7 10*3/uL (ref 0.1–0.9)
NEUTROS PCT: 56 %
Neutrophils Absolute: 3.4 10*3/uL (ref 1.4–7.0)
PLATELETS: 178 10*3/uL (ref 150–379)
RBC: 4.09 x10E6/uL — ABNORMAL LOW (ref 4.14–5.80)
RDW: 15.2 % (ref 12.3–15.4)
WBC: 5.9 10*3/uL (ref 3.4–10.8)

## 2017-03-30 LAB — PT AND PTT
INR: 1.2 (ref 0.8–1.2)
Prothrombin Time: 11.9 s (ref 9.1–12.0)
aPTT: 29 s (ref 24–33)

## 2017-03-30 LAB — LIPID PANEL W/O CHOL/HDL RATIO
Cholesterol, Total: 128 mg/dL (ref 100–199)
HDL: 69 mg/dL (ref >39)
LDL Calculated: 42 mg/dL (ref 0–99)
Triglycerides: 87 mg/dL (ref 0–149)
VLDL CHOLESTEROL CAL: 17 mg/dL (ref 5–40)

## 2017-03-30 NOTE — Telephone Encounter (Signed)
As per manager's request I called patient to advise him that his upcoming cath has been approved. I left detailed message since it stated it was okay in epic to do so with Auth # and valid coverage dates.   I left my name and direct line if he had any further questions.

## 2017-04-02 ENCOUNTER — Telehealth: Payer: Self-pay

## 2017-04-02 NOTE — Telephone Encounter (Signed)
Patient walked in office Friday 03/30/17.He was upset he got a letter from [facility] saying cardiac cath was approved,but Dr.Cooper's name was on letter.He did not want to change Drs.Advised Dr.Jordan is his cardiologist.Dr.Cooper will be the Dr.who does the TAVR procedure.

## 2017-04-04 ENCOUNTER — Ambulatory Visit: Payer: Medicare Other | Admitting: Cardiology

## 2017-04-05 ENCOUNTER — Telehealth: Payer: Self-pay | Admitting: *Deleted

## 2017-04-05 NOTE — Telephone Encounter (Signed)
Pt contacted pre-catheterization scheduled at [facility] for: Friday March 1,2019 10:30 AM Verified arrival time and place: [place] [facility] [address] [place] at: 8 AM Nothing to eat or drink after midnight prior to cath. Verified no diabetes medications.  Hold lasix AM of cath.  Except hold medications  AM meds can be  taken pre-cath with sip of water including: ASA am of cath   Confirmed patient has responsible person to drive home post procedure and observe patient for 24 hours: yes

## 2017-04-06 ENCOUNTER — Ambulatory Visit (HOSPITAL_COMMUNITY)
Admission: RE | Admit: 2017-04-06 | Discharge: 2017-04-06 | Disposition: A | Discharge status: Home / Self Care | Payer: Medicare Other | Source: Ambulatory Visit | Attending: Cardiology | Admitting: Cardiology

## 2017-04-06 ENCOUNTER — Encounter (HOSPITAL_COMMUNITY): Payer: Self-pay

## 2017-04-06 ENCOUNTER — Encounter (HOSPITAL_COMMUNITY)
Admission: RE | Disposition: A | Discharge status: Home / Self Care | Payer: Self-pay | Source: Ambulatory Visit | Attending: Cardiology

## 2017-04-06 DIAGNOSIS — Z955 Presence of coronary angioplasty implant and graft: Secondary | ICD-10-CM | POA: Insufficient documentation

## 2017-04-06 DIAGNOSIS — I2510 Atherosclerotic heart disease of native coronary artery without angina pectoris: Secondary | ICD-10-CM | POA: Diagnosis present

## 2017-04-06 DIAGNOSIS — F4321 Adjustment disorder with depressed mood: Secondary | ICD-10-CM | POA: Diagnosis not present

## 2017-04-06 DIAGNOSIS — Z87442 Personal history of urinary calculi: Secondary | ICD-10-CM | POA: Diagnosis not present

## 2017-04-06 DIAGNOSIS — I252 Old myocardial infarction: Secondary | ICD-10-CM | POA: Diagnosis not present

## 2017-04-06 DIAGNOSIS — I272 Pulmonary hypertension, unspecified: Secondary | ICD-10-CM | POA: Insufficient documentation

## 2017-04-06 DIAGNOSIS — I130 Hypertensive heart and chronic kidney disease with heart failure and stage 1 through stage 4 chronic kidney disease, or unspecified chronic kidney disease: Secondary | ICD-10-CM | POA: Diagnosis not present

## 2017-04-06 DIAGNOSIS — E785 Hyperlipidemia, unspecified: Secondary | ICD-10-CM | POA: Insufficient documentation

## 2017-04-06 DIAGNOSIS — I4891 Unspecified atrial fibrillation: Secondary | ICD-10-CM | POA: Insufficient documentation

## 2017-04-06 DIAGNOSIS — Z79899 Other long term (current) drug therapy: Secondary | ICD-10-CM | POA: Insufficient documentation

## 2017-04-06 DIAGNOSIS — I10 Essential (primary) hypertension: Secondary | ICD-10-CM | POA: Diagnosis present

## 2017-04-06 DIAGNOSIS — Z87891 Personal history of nicotine dependence: Secondary | ICD-10-CM | POA: Insufficient documentation

## 2017-04-06 DIAGNOSIS — Z888 Allergy status to other drugs, medicaments and biological substances status: Secondary | ICD-10-CM | POA: Diagnosis not present

## 2017-04-06 DIAGNOSIS — N183 Chronic kidney disease, stage 3 (moderate): Secondary | ICD-10-CM | POA: Insufficient documentation

## 2017-04-06 DIAGNOSIS — I5022 Chronic systolic (congestive) heart failure: Secondary | ICD-10-CM | POA: Diagnosis present

## 2017-04-06 DIAGNOSIS — E7800 Pure hypercholesterolemia, unspecified: Secondary | ICD-10-CM | POA: Diagnosis present

## 2017-04-06 DIAGNOSIS — I350 Nonrheumatic aortic (valve) stenosis: Secondary | ICD-10-CM | POA: Diagnosis not present

## 2017-04-06 HISTORY — PX: RIGHT/LEFT HEART CATH AND CORONARY ANGIOGRAPHY: CATH118266

## 2017-04-06 LAB — POCT I-STAT 3, ART BLOOD GAS (G3+)
Bicarbonate: 25.7 mmol/L (ref 20.0–28.0)
O2 Saturation: 97 %
PCO2 ART: 45.9 mmHg (ref 32.0–48.0)
TCO2: 27 mmol/L (ref 22–32)
pH, Arterial: 7.356 (ref 7.350–7.450)
pO2, Arterial: 96 mmHg (ref 83.0–108.0)

## 2017-04-06 LAB — POCT I-STAT 3, VENOUS BLOOD GAS (G3P V)
Bicarbonate: 26.4 mmol/L (ref 20.0–28.0)
O2 SAT: 70 %
TCO2: 28 mmol/L (ref 22–32)
pCO2, Ven: 48.8 mmHg (ref 44.0–60.0)
pH, Ven: 7.341 (ref 7.250–7.430)
pO2, Ven: 40 mmHg (ref 32.0–45.0)

## 2017-04-06 SURGERY — RIGHT/LEFT HEART CATH AND CORONARY ANGIOGRAPHY
Anesthesia: LOCAL

## 2017-04-06 MED ORDER — ASPIRIN 81 MG PO CHEW: 81.0000 mg | CHEWABLE_TABLET | ORAL | Status: DC

## 2017-04-06 MED ORDER — SODIUM CHLORIDE 0.9 % IV SOLN: 250.0000 mL | INTRAVENOUS | Status: DC | PRN

## 2017-04-06 MED ORDER — LIDOCAINE HCL 1 % IJ SOLN
INTRAMUSCULAR | Status: AC
  Filled 2017-04-06: qty 20

## 2017-04-06 MED ORDER — HEPARIN (PORCINE) IN NACL 2-0.9 UNIT/ML-% IJ SOLN
INTRAMUSCULAR | Status: DC | PRN
  Administered 2017-04-06: 10 mL via INTRA_ARTERIAL

## 2017-04-06 MED ORDER — MIDAZOLAM HCL 2 MG/2ML IJ SOLN
INTRAMUSCULAR | Status: AC
  Filled 2017-04-06: qty 2

## 2017-04-06 MED ORDER — HEPARIN (PORCINE) IN NACL 2-0.9 UNIT/ML-% IJ SOLN
INTRAMUSCULAR | Status: AC
  Filled 2017-04-06: qty 1000

## 2017-04-06 MED ORDER — VERAPAMIL HCL 2.5 MG/ML IV SOLN
INTRAVENOUS | Status: AC
  Filled 2017-04-06: qty 2

## 2017-04-06 MED ORDER — SODIUM CHLORIDE 0.9 % IV SOLN: INTRAVENOUS | Status: DC

## 2017-04-06 MED ORDER — FENTANYL CITRATE (PF) 100 MCG/2ML IJ SOLN
INTRAMUSCULAR | Status: DC | PRN
  Administered 2017-04-06: 25 ug via INTRAVENOUS

## 2017-04-06 MED ORDER — HEPARIN SODIUM (PORCINE) 1000 UNIT/ML IJ SOLN
INTRAMUSCULAR | Status: AC
  Filled 2017-04-06: qty 1

## 2017-04-06 MED ORDER — IOPAMIDOL (ISOVUE-370) INJECTION 76%
INTRAVENOUS | Status: AC
  Filled 2017-04-06: qty 100

## 2017-04-06 MED ORDER — MIDAZOLAM HCL 2 MG/2ML IJ SOLN
INTRAMUSCULAR | Status: DC | PRN
  Administered 2017-04-06: 1 mg via INTRAVENOUS

## 2017-04-06 MED ORDER — SODIUM CHLORIDE 0.9% FLUSH: 3.0000 mL | Freq: Two times a day (BID) | INTRAVENOUS | Status: DC

## 2017-04-06 MED ORDER — HEPARIN SODIUM (PORCINE) 1000 UNIT/ML IJ SOLN
INTRAMUSCULAR | Status: DC | PRN
  Administered 2017-04-06: 4500 [IU] via INTRAVENOUS

## 2017-04-06 MED ORDER — SODIUM CHLORIDE 0.9% FLUSH: 3.0000 mL | INTRAVENOUS | Status: DC | PRN

## 2017-04-06 MED ORDER — FENTANYL CITRATE (PF) 100 MCG/2ML IJ SOLN
INTRAMUSCULAR | Status: AC
  Filled 2017-04-06: qty 2

## 2017-04-06 MED ORDER — IOPAMIDOL (ISOVUE-370) INJECTION 76%
INTRAVENOUS | Status: DC | PRN
  Administered 2017-04-06: 50 mL via INTRA_ARTERIAL

## 2017-04-06 MED ORDER — LIDOCAINE HCL (PF) 1 % IJ SOLN
INTRAMUSCULAR | Status: DC | PRN
  Administered 2017-04-06 (×2): 2 mL

## 2017-04-06 MED ORDER — SODIUM CHLORIDE 0.9 % IV SOLN
250.0000 mL | INTRAVENOUS | Status: DC | PRN
Start: 2017-04-06 — End: 2017-04-06

## 2017-04-06 MED ORDER — HEPARIN (PORCINE) IN NACL 2-0.9 UNIT/ML-% IJ SOLN
INTRAMUSCULAR | Status: AC | PRN
  Administered 2017-04-06 (×2): 500 mL

## 2017-04-06 SURGICAL SUPPLY — 12 items
CATH BALLN WEDGE 5F 110CM (CATHETERS) ×1 IMPLANT
CATH IMPULSE 5F ANG/FL3.5 (CATHETERS) ×1 IMPLANT
DEVICE RAD COMP TR BAND LRG (VASCULAR PRODUCTS) ×1 IMPLANT
GLIDESHEATH SLEND SS 6F .021 (SHEATH) ×1 IMPLANT
GUIDEWIRE INQWIRE 1.5J.035X260 (WIRE) IMPLANT
INQWIRE 1.5J .035X260CM (WIRE) ×2
KIT HEART LEFT (KITS) ×2 IMPLANT
PACK CARDIAC CATHETERIZATION (CUSTOM PROCEDURE TRAY) ×2 IMPLANT
SHEATH GLIDE SLENDER 4/5FR (SHEATH) ×2 IMPLANT
TRANSDUCER W/STOPCOCK (MISCELLANEOUS) ×2 IMPLANT
TUBING CIL FLEX 10 FLL-RA (TUBING) ×2 IMPLANT
WIRE HI TORQ VERSACORE-J 145CM (WIRE) ×1 IMPLANT

## 2017-04-06 NOTE — Discharge Instructions (Signed)

## 2017-04-06 NOTE — Interval H&P Note (Signed)
History and Physical Interval Note:  04/06/2017 10:24 AM  Jonathan Acevedo  has presented today for surgery, with the diagnosis of as  The various methods of treatment have been discussed with the patient and family. After consideration of risks, benefits and other options for treatment, the patient has consented to  Procedure(s): RIGHT/LEFT HEART CATH AND CORONARY ANGIOGRAPHY (N/A) as a surgical intervention .  The patient's history has been reviewed, patient examined, no change in status, stable for surgery.  I have reviewed the patient's chart and labs.  Questions were answered to the patient's satisfaction.     Col[facility] 04/06/2017 10:24 AM

## 2017-04-09 ENCOUNTER — Telehealth: Payer: Self-pay | Admitting: Cardiology

## 2017-04-09 NOTE — Telephone Encounter (Signed)
New Message:      Please have Jonathan Acevedo called pt tomorrow about his 04-20-17.He says his appt is supposed to be at 4:00 on 04-20-17.

## 2017-04-10 NOTE — Telephone Encounter (Signed)
Returned call to patient left message on personal voice mail your appointment time has been changed.Your appointment with Dr.Jordan 04/20/17 at 4:20 pm.

## 2017-04-10 NOTE — Telephone Encounter (Signed)
Returned call to patient he stated he thought his appointment with Dr.Jordan 04/20/17 was at 4:00 pm.Stated his friend that comes with him is unable to come at 1:20 pm.Advised I will see if the patient scheduled at 4:00 pm can change times with you.

## 2017-04-16 NOTE — Progress Notes (Signed)
d  Jonathan Acevedo Date of Birth: Nov 20, 1928 Medical Record #[id number]  History of Present Illness: Jonathan Acevedo is seen for follow up of his recent cardiac cath and severe AS.  He has a history of CAD with remote MI and prior stent to the LAD, HTN, CKD, AS and HLD. In April of 2014 a stress Myoview study showed extensive scar of the anterior apex and inferior wall. Ejection fraction is 44%. This actually represented an improvement from echocardiogram in 2012 at which time his ejection fraction was 35-40%. In Ocober 2014 he presented with a prolonged episode of chest pain. Echo showed no change in his moderate AS - medical management was continued.   He was admitted twice  in April 2015 with syncope that resulted in a fractured right ankle and acute systolic HF. Cardiac cath showed no obstructive disease and mild to moderate AS. He had a loop recorder placed. Syncope felt to be related to Flomax. Unable to take ACEi due to CKD and hypotension.  In July 2015 he was readmitted with acute on chronic CHF. Diuresed with good result. No recurrent syncope.   Echo November 2016 showed moderate AS with mean gradient 17 mm Hg. EF 30%. Repeat Echo in January 2018 showed stable EF of 30-35% with increase mean AV gradient to 36 mm Hg. His most recent Echo showed progression of AS with mean gradient of 40 mm Hg, worsening EF 25-30%, and worsening pulmonary Htn.   Last loop recorder evaluation on 07/06/16 showed normal device function and no Afib.   He underwent recent cardiac cath showing nonobstructive CAD, severe AS and moderate pulmonary venous HTN. Recommended TAVR evaluation.  On follow up today he is doing OK. He denies any chest pain. He does have some  SOB with exertion.  No increased in edema. Weight is stable. No dizziness or syncope.  He has many questions concerning TAVR and expected outcomes.  Current Outpatient Medications  Medication Sig Dispense Refill  . aspirin 81 MG chewable tablet Chew 162 mg  by mouth daily as needed (Chest pain).     Marland Kitchen atorvastatin (LIPITOR) 40 MG tablet TAKE 1 TABLET BY MOUTH  DAILY 90 tablet 1  . calcium-vitamin D (OSCAL WITH D) 500-200 MG-UNIT tablet Take 1 tablet by mouth daily with breakfast.    . clonazePAM (KLONOPIN) 0.5 MG tablet Take 0.5 mg by mouth at bedtime.    . furosemide (LASIX) 40 MG tablet TAKE 1 TABLET BY MOUTH IN  THE MORNING AND 1/2 TABLET  IN THE EVENING MAY TAKE ONE MORE TABLET FOR WEIGHT GAIN OR INCREASE SWELLING 180 tablet 2  . nitroGLYCERIN (NITROSTAT) 0.4 MG SL tablet Place 1 tablet (0.4 mg total) under the tongue every 5 (five) minutes as needed for chest pain. 75 tablet 3  . potassium chloride SA (K-DUR,KLOR-CON) 20 MEQ tablet TAKE 1 TABLET BY MOUTH  DAILY 90 tablet 1  . traMADol (ULTRAM) 50 MG tablet Take 50-100 mg by mouth every 6 (six) hours as needed for moderate pain.     . vitamin B-12 (CYANOCOBALAMIN) 1000 MCG tablet Take 1,000 mcg by mouth every morning.     No current facility-administered medications for this visit.     Allergies  Allergen Reactions  . Ibandronic Acid Other (See Comments)    Muscle Aches-pt denies  . Risedronate Sodium Other (See Comments)    Muscle aches  . Flomax [Tamsulosin Hcl] Other (See Comments)    Syncope     Past Medical History:  Diagnosis  Date  . Afib ([place])   . Aortic stenosis   . Arthritis   . Cataract   . CHF (congestive heart failure) ([place])   . Coronary artery disease    w remote anterior myocardial infarction (1998)  . GERD (gastroesophageal reflux disease)   . Hypercalcemia 12/26/2011  . Hypercholesteremia   . Hypertension   . Inguinal hernia    right  . Kidney stones   . Myocardial infarct, old    3 times   . 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 Rectal polyp   . Renal insufficiency   . Syncope 05/18/2013  . Wears glasses     Past Surgical History:  Procedure Laterality Date  . CARDIAC CATHETERIZATION  08/18/2008   [place] 20, LAD stent 20 ISR, CFX 10, RCA 20, EF 45  .  CATARACT EXTRACTION    . COLONOSCOPY W/ BIOPSIES AND POLYPECTOMY    . CORONARY STENT PLACEMENT  07/19/1996   Proximal LAD  . ELECTROPHYSIOLOGY STUDY  05-19-13   EPS for syncope without inducible arrhythmias by Dr Lovena Le  . ELECTROPHYSIOLOGY STUDY N/A 05/19/2013   Procedure: ELECTROPHYSIOLOGY STUDY;  Surgeon: Evans Lance, MD;  Location: [facility] CATH LAB;  Service: Cardiovascular;  Laterality: N/A;  . FRACTURE SURGERY     right hip  . INGUINAL HERNIA REPAIR     left  . INGUINAL HERNIA REPAIR Right 02/09/2015   Procedure: RIGHT INGUINAL HERNIA REPAIR W/ MESH;  Surgeon: Jackolyn Confer, MD;  Location: [facility];  Service: General;  Laterality: Right;  inguinal hernia  . INSERTION OF MESH Right 02/09/2015   Procedure: INSERTION OF MESH to right inguinal hernia;  Surgeon: Jackolyn Confer, MD;  Location: [place] [facility];  Service: General;  Laterality: Right;  . LEFT HEART CATHETERIZATION WITH CORONARY ANGIOGRAM N/A 05/19/2013   Procedure: LEFT HEART CATHETERIZATION WITH CORONARY ANGIOGRAM;  Surgeon: Peter M Martinique, MD;  Location: [facility] CATH LAB;  Service: Cardiovascular;  Laterality: N/A;  . LOOP RECORDER IMPLANT  05-19-13   MDT LinQ implanted by Dr Lovena Le for syncope following negative EPS  . ORIF FEMUR FRACTURE     right  . parathyroidectomy    . RIGHT/LEFT HEART CATH AND CORONARY ANGIOGRAPHY N/A 04/06/2017   Procedure: RIGHT/LEFT HEART CATH AND CORONARY ANGIOGRAPHY;  Surgeon: Martinique, Peter M, MD;  Location: [facility] CV LAB;  Service: Cardiovascular;  Laterality: N/A;  . UMBILICAL HERNIA REPAIR      Social History   Tobacco Use  Smoking Status Former Smoker  . Packs/day: 1.00  . Years: 45.00  . Pack years: 45.00  . Types: Cigarettes  . Last attempt to quit: 07/19/1996  . Years since quitting: 20.7  Smokeless Tobacco Never Used    Social History   Substance and Sexual Activity  Alcohol Use No    History reviewed. No pertinent family history.  Review of Systems: The review of systems is per the HPI.   All other systems were reviewed and are negative.  Physical Exam: BP 120/80   Pulse 72   Ht 5\' 10"  (1.778 m)   Wt 182 lb (82.6 kg)   BMI 26.11 kg/m  GENERAL:  Well appearing WM in NAD HEENT:  PERRL, EOMI, sclera are clear. Oropharynx is clear. NECK:  No jugular venous distention, carotid upstroke brisk and symmetric, no bruits, no thyromegaly or adenopathy LUNGS:  Clear to auscultation bilaterally CHEST:  Unremarkable HEART:  RRR,  PMI not displaced or sustained,S1 and S2 within normal limits, no S3, no S4: no clicks, no rubs,  There is a gr 3/6 systolic murmur RUSB radiating across the precordium.  ABD:  Soft, nontender. BS +, no masses or bruits. No hepatomegaly, no splenomegaly EXT:  2 + pulses throughout, no edema, no cyanosis no clubbing SKIN:  Warm and dry.  No rashes NEURO:  Alert and oriented x 3. Cranial nerves II through XII intact. PSYCH:  Cognitively intact      Wt Readings from Last 3 Encounters:  04/20/17 182 lb (82.6 kg)  04/06/17 182 lb (82.6 kg)  03/23/17 183 lb 12.8 oz (83.4 kg)     LABORATORY DATA:   Lab Results  Component Value Date   WBC 5.9 03/30/2017   HGB 12.4 (L) 03/30/2017   HCT 37.0 (L) 03/30/2017   PLT 178 03/30/2017   GLUCOSE 88 03/30/2017   CHOL 128 03/30/2017   TRIG 87 03/30/2017   HDL 69 03/30/2017   LDLCALC 42 03/30/2017   ALT 11 01/25/2016   AST 16 01/25/2016   NA 143 03/30/2017   K 4.3 03/30/2017   CL 103 03/30/2017   CREATININE 1.52 (H) 03/30/2017   BUN 30 (H) 03/30/2017   CO2 24 03/30/2017   TSH 1.453Test methodology is 3rd generation TSH 08/17/2008   INR 1.2 03/30/2017   HGBA1C 6.2 (H) 11/16/2012      Lab Results  Component Value Date   CHOL 128 03/30/2017   HDL 69 03/30/2017   LDLCALC 42 03/30/2017   TRIG 87 03/30/2017   CHOLHDL 2.3 01/25/2016   Labs dated 07/18/16: creatinine 1.52. Other chemistries and CBC normal.  Echo 02/17/16: Study Conclusions  - Left ventricle: The cavity size was moderately dilated.  Wall   thickness was normal. Systolic function was moderately to   severely reduced. The estimated ejection fraction was in the   range of 30% to 35%. Akinesis of the mid-apicalanteroseptal   myocardium. Doppler parameters are consistent with abnormal left   ventricular relaxation (grade 1 diastolic dysfunction). - Aortic valve: Moderately to severely calcified annulus. Mildly   thickened, moderately calcified leaflets. There was moderate to   severe stenosis. There was mild regurgitation. Mean gradient (S):   36 mm Hg. Peak gradient (S): 60 mm Hg. Valve area (VTI): 1.23   cm^2. Valve area (Vmax): 1.28 cm^2. Valve area (Vmean): 1.31   cm^2. - Mitral valve: Mean gradient (D): 2 mm Hg. Valve area by   continuity equation (using LVOT flow): 3.11 cm^2. - Left atrium: The atrium was severely dilated. - Right atrium: The atrium was mildly dilated. - Pulmonary arteries: Systolic pressure was moderately increased.   PA peak pressure: 47 mm Hg (S).  Echo 03/15/17: Study Conclusions  - Left ventricle: The cavity size was mildly dilated. There was   moderate concentric hypertrophy. Systolic function was severely   reduced. The estimated ejection fraction was in the range of 25%   to 30%. Diffuse hypokinesis with akinesis of the mid-apical   anteroseptal myocardium. Doppler parameters are consistent with a   reversible restrictive pattern, indicative of decreased left   ventricular diastolic compliance and/or increased left atrial   pressure (grade 3 diastolic dysfunction). Doppler parameters are   consistent with high ventricular filling pressure. - Aortic valve: Valve mobility was restricted. There was severe   stenosis. There was mild regurgitation. - Aorta: Ascending aortic diameter: 37 mm (S). - Mitral valve: Severely calcified annulus, mostly posterior.   Mobility of the posterior leaflet was restricted. Transvalvular   velocity was within the normal range. There was no evidence for  stenosis. There was trivial regurgitation. - Left atrium: The atrium was severely dilated. - Right ventricle: The cavity size was mildly dilated. Wall   thickness was normal. Systolic function was normal. - Right atrium: The atrium was mildly dilated. - Tricuspid valve: There was mild regurgitation. - Pulmonary arteries: Systolic pressure was severely increased. PA   peak pressure: 82 mm Hg (S).  ------------------------------------------------------------------- Study data:   Study status:  Routine.  Procedure:  The patient reported no pain pre or post test. Transthoracic echocardiography for left ventricular function evaluation, for right ventricular function evaluation, and for assessment of valvular function. Image quality was adequate.  Study completion:  There were no complications.          Echocardiography.  M-mode, complete 2D, spectral Doppler, and color Doppler.  Birthdate:  Patient birthdate: 30-Aug-1928.  Age:  Patient is 84 yr old.  Sex:  Gender: male.    BMI: 26.8 kg/m^2.  Blood pressure:     122/72  Patient status:  Outpatient.  Study date:  Study date: 03/15/2017. Study time: 11:53 AM.  Location:  [place] [facility]  -------------------------------------------------------------------  ------------------------------------------------------------------- Left ventricle:  The cavity size was mildly dilated. There was moderate concentric hypertrophy. Systolic function was severely reduced. The estimated ejection fraction was in the range of 25% to 30%.  Regional wall motion abnormalities:  Diffuse hypokinesis with akinesis of the mid-apical anteroseptal myocardium. Doppler parameters are consistent with a reversible restrictive pattern, indicative of decreased left ventricular diastolic compliance and/or increased left atrial pressure (grade 3 diastolic dysfunction). Doppler parameters are consistent with high ventricular filling  pressure.  ------------------------------------------------------------------- Aortic valve:   Trileaflet; severely thickened, severely calcified leaflets. Valve mobility was restricted.  Doppler:   There was severe stenosis.   There was mild regurgitation.    VTI ratio of LVOT to aortic valve: 0.25. Valve area (VTI): 0.85 cm^2. Indexed valve area (VTI): 0.41 cm^2/m^2. Peak velocity ratio of LVOT to aortic valve: 0.25. Valve area (Vmax): 0.86 cm^2. Indexed valve area (Vmax): 0.42 cm^2/m^2. Mean velocity ratio of LVOT to aortic valve: 0.25. Valve area (Vmean): 0.87 cm^2. Indexed valve area (Vmean): 0.42 cm^2/m^2.    Mean gradient (S): 40 mm Hg. Peak gradient (S): 62 mm Hg.  ------------------------------------------------------------------- Aorta:  Aortic root: The aortic root was normal in size. Ascending aorta: The ascending aorta was mildly dilated.  ------------------------------------------------------------------- Mitral valve:  Severely calcified annulus, mostly posterior. Mobility of the posterior leaflet was restricted.  Doppler: Transvalvular velocity was within the normal range. There was no evidence for stenosis. There was trivial regurgitation.    Peak gradient (D): 7 mm Hg.  ------------------------------------------------------------------- Left atrium:  The atrium was severely dilated.  ------------------------------------------------------------------- Right ventricle:  The cavity size was mildly dilated. Wall thickness was normal. Systolic function was normal.  ------------------------------------------------------------------- Pulmonic valve:    Doppler:  Transvalvular velocity was within the normal range. There was no evidence for stenosis.  ------------------------------------------------------------------- Tricuspid valve:   Structurally normal valve.    Doppler: Transvalvular velocity was within the normal range. There was  mild regurgitation.  ------------------------------------------------------------------- Pulmonary artery:   The main pulmonary artery was normal-sized. Systolic pressure was severely increased.  ------------------------------------------------------------------- Right atrium:  The atrium was mildly dilated.  ------------------------------------------------------------------- Pericardium:  There was no pericardial effusion.  ------------------------------------------------------------------- Systemic veins: Inferior vena cava: The vessel was dilated. The respirophasic diameter changes were blunted (< 50%), consistent with elevated central venous pressure.  ------------------------------------------------------------------- Measurements   Left ventricle  Value          Reference  LV ID, ED, PLAX chordal           (H)     54.5  mm       43 - 52  LV ID, ES, PLAX chordal           (H)     42.7  mm       23 - 38  LV fx shortening, PLAX chordal    (L)     22    %        >=29  LV PW thickness, ED                       14.1  mm       ---------  IVS/LV PW ratio, ED                       0.97           <=1.3  Stroke volume, 2D                         78    ml       ---------  Stroke volume/bsa, 2D                     38    ml/m^2   ---------  LV e&', lateral                            6.58  cm/s     ---------  LV E/e&', lateral                          19.6           ---------  LV e&', medial                             5.37  cm/s     ---------  LV E/e&', medial                           24.02          ---------  LV e&', average                            5.98  cm/s     ---------  LV E/e&', average                          21.59          ---------    Ventricular septum                        Value          Reference  IVS thickness, ED                         13.7  mm       ---------    LVOT  Value          Reference   LVOT ID, S                                21    mm       ---------  LVOT area                                 3.46  cm^2     ---------  LVOT ID                                   21    mm       ---------  LVOT peak velocity, S                     97.7  cm/s     ---------  LVOT mean velocity, S                     76.2  cm/s     ---------  LVOT VTI, S                               22.5  cm       ---------  LVOT peak gradient, S                     4     mm Hg    ---------  Stroke volume (SV), LVOT DP               77.9  ml       ---------  Stroke index (SV/bsa), LVOT DP            37.8  ml/m^2   ---------    Aortic valve                              Value          Reference  Aortic valve peak velocity, S             392   cm/s     ---------  Aortic valve mean velocity, S             303   cm/s     ---------  Aortic valve VTI, S                       91.4  cm       ---------  Aortic mean gradient, S                   40    mm Hg    ---------  Aortic peak gradient, S                   62    mm Hg    ---------  VTI ratio, LVOT/AV                        0.25           ---------  Aortic valve area, VTI  0.85  cm^2     ---------  Aortic valve area/bsa, VTI                0.41  cm^2/m^2 ---------  Velocity ratio, peak, LVOT/AV             0.25           ---------  Aortic valve area, peak velocity          0.86  cm^2     ---------  Aortic valve area/bsa, peak               0.42  cm^2/m^2 ---------  velocity  Velocity ratio, mean, LVOT/AV             0.25           ---------  Aortic valve area, mean velocity          0.87  cm^2     ---------  Aortic valve area/bsa, mean               0.42  cm^2/m^2 ---------  velocity    Aorta                                     Value          Reference  Aortic root ID, ED                        35    mm       ---------  Ascending aorta ID, A-P, S                37    mm       ---------    Left atrium                               Value           Reference  LA ID, A-P, ES                            59    mm       ---------  LA ID/bsa, A-P                    (H)     2.86  cm/m^2   <=2.2  LA volume, S                              144   ml       ---------  LA volume/bsa, S                          69.9  ml/m^2   ---------  LA volume, ES, 1-p A4C                    117   ml       ---------  LA volume/bsa, ES, 1-p A4C                56.8  ml/m^2   ---------  LA volume, ES, 1-p A2C  173   ml       ---------  LA volume/bsa, ES, 1-p A2C                84    ml/m^2   ---------    Mitral valve                              Value          Reference  Mitral E-wave peak velocity               129   cm/s     ---------  Mitral A-wave peak velocity               61.4  cm/s     ---------  Mitral deceleration time                  155   ms       150 - 230  Mitral peak gradient, D                   7     mm Hg    ---------  Mitral E/A ratio, peak                    2.1            ---------    Pulmonary arteries                        Value          Reference  PA pressure, S, DP                (H)     82    mm Hg    <=30    Tricuspid valve                           Value          Reference  Tricuspid regurg peak velocity            408   cm/s     ---------  Tricuspid peak RV-RA gradient             67    mm Hg    ---------    Systemic veins                            Value          Reference  Estimated CVP                             15    mm Hg    ---------    Right ventricle                           Value          Reference  RV pressure, S, DP                (H)     82    mm Hg    <=30  RV s&', lateral, S  14.3  cm/s     ---------  Legend: (L)  and  (H)  mark values outside specified reference range.  ------------------------------------------------------------------- Prepared and Electronically Authenticated by  Skeet Latch, MD 2019-02-07T15:50:49  RIGHT/LEFT HEART CATH AND CORONARY  ANGIOGRAPHY  Conclusion     Ost LM lesion is 30% stenosed.  Prox LAD lesion is 20% stenosed.  LV end diastolic pressure is moderately elevated.  There is severe aortic valve stenosis.  Hemodynamic findings consistent with moderate pulmonary hypertension.   1. Nonobstructive CAD. Old stent in LAD is patent 2. Moderate pulmonary venous HTN. Mean PAP 40 mm Hg 3. Moderately elevated LV filling pressures.  4. Severe Aortic stenosis. AV mean gradient 32 mm Hg, AV area 0.98 cm squared. Gradient relatively lower due to low EF.   Plan: refer to valve clinic for consideration of TAVR.    Indications   Nonrheumatic aortic valve stenosis [I35.0 (ICD-10-CM)]  Procedural Details/Technique   Technical Details Indication: 84 yo WM with progressive aortic stenosis. EF 25-30% by Echo  Procedural Details: The right wrist was prepped, draped, and anesthetized with 1% lidocaine. Using the modified Seldinger technique a 6 Fr slender sheath was placed in the right radial artery and a 5 French sheath was placed in the right brachial vein. A Swan-Ganz catheter was used for the right heart catheterization. Standard protocol was followed for recording of right heart pressures and sampling of oxygen saturations. Fick cardiac output was calculated. Standard Judkins catheters were used for selective coronary angiography and left ventriculography. There were no immediate procedural complications. The patient was transferred to the post catheterization recovery area for further monitoring.  Contrast: 50 cc   Estimated blood loss <50 mL.  During this procedure the patient was administered the following to achieve and maintain moderate conscious sedation: Versed 1 mg, Fentanyl 25 mcg, while the patient's heart rate, blood pressure, and oxygen saturation were continuously monitored. The period of conscious sedation was 36 minutes, of which I was present face-to-face 100% of this time.  Complications    Complications documented before study signed (04/06/2017 11:21 AM EST)    No complications were associated with this study.  Documented by Martinique, Peter M, MD - 04/06/2017 11:18 AM EST    Coronary Findings   Diagnostic  Dominance: Right  Left Main  Ost LM lesion 30% stenosed  Ost LM lesion is 30% stenosed.  Left Anterior Descending  Prox LAD lesion 20% stenosed  Prox LAD lesion is 20% stenosed. The lesion was previously treated using a stent (unknown type) over 2 years ago.  Left Circumflex  Vessel was injected. Vessel is large. The vessel exhibits minimal luminal irregularities. The vessel is mildly calcified.  Right Coronary Artery  Vessel was injected. Vessel is large. There is mild diffuse disease throughout the vessel. The vessel is mildly calcified.  Intervention   No interventions have been documented.  Right Heart   Right Heart Pressures Hemodynamic findings consistent with moderate pulmonary hypertension.  Left Heart   Left Ventricle LV end diastolic pressure is moderately elevated.  Aortic Valve There is severe aortic valve stenosis. The aortic valve is calcified.  Coronary Diagrams   Diagnostic Diagram       Implants     No implant documentation for this case.  MERGE Images   Show images for CARDIAC CATHETERIZATION   Link to Procedure Log   Procedure Log    Hemo Data    Most Recent Value  Fick Cardiac Output 5.87 L/min  Fick Cardiac  Output Index 2.92 (L/min)/BSA  Aortic Mean Gradient 31.5 mmHg  Aortic Peak Gradient 35 mmHg  Aortic Valve Area 0.98  Aortic Value Area Index 0.49 cm2/BSA  RA A Wave 11 mmHg  RA V Wave 8 mmHg  RA Mean 7 mmHg  RV Systolic Pressure 69 mmHg  RV Diastolic Pressure 1 mmHg  RV EDP 11 mmHg  PA Systolic Pressure 67 mmHg  PA Diastolic Pressure 27 mmHg  PA Mean 40 mmHg  PW A Wave 32 mmHg  PW V Wave 42 mmHg  PW Mean 30 mmHg  AO Systolic Pressure 001 mmHg  AO Diastolic Pressure 65 mmHg  AO Mean 87 mmHg  LV Systolic  Pressure 749 mmHg  LV Diastolic Pressure 10 mmHg  LV EDP 31 mmHg  Arterial Occlusion Pressure Extended Systolic Pressure 449 mmHg  Arterial Occlusion Pressure Extended Diastolic Pressure 66 mmHg  Arterial Occlusion Pressure Extended Mean Pressure 87 mmHg  Left Ventricular Apex Extended Systolic Pressure 675 mmHg  Left Ventricular Apex Extended Diastolic Pressure 7 mmHg  Left Ventricular Apex Extended EDP Pressure 32 mmHg  QP/QS 1  TPVR Index 13.69 HRUI  TSVR Index 29.79 HRUI  PVR SVR Ratio 0.13  TPVR/TSVR Ratio 0.46     Assessment / Plan:  1. Chronic systolic HF - EF 91-63%-WGYK is down from baseline probably related to progressive AS. He is doing well clinically but does have dyspnea on exertion. Continue sodium restriction. Continue current diuretic dose.  Not a candidate for beta blockers due to bradycardia. Not a candidate for ACEi due to hypotension and CKD. Intolerant of nitrates due to hypotension. Medical therapy for his low EF is limited. I am concerned that with progressive AS his LV function will continue to deteriorate and he will likely develop more severe CHF. We have referred him for TAVR evaluation. Will see Dr. Burt Knack on April 5. We discussed potential need to see dentist prior to TAVR. He has a lot of questions concerning potential complications and I will defer to our Valve team.   2. CAD -remote anterior MI and stent of LAD. Cardiac cath recently showed nonobstructive disease. No active symptoms.   3. Severe Aortic stenosis. Based on recent Echo gradient has increased, EF has decreased and he has more pulmonary HTN. As noted above.  4. CKD- stage 3.   5. Syncope. Most likely related to orthostasis on Flomax. Loop recorder without significant arrhythmia.   6. Situational depression/anxiety  7. Hyperlipidemia. On lipitor.

## 2017-04-19 IMAGING — CR DG CHEST 2V
2 series · 2 of 2 positions shown · non-contrast
Comparison: 08/30/2014

CLINICAL DATA: Chest pain and shortness of breath for few hours.

EXAM:
CHEST  2 VIEW

[chest pa]
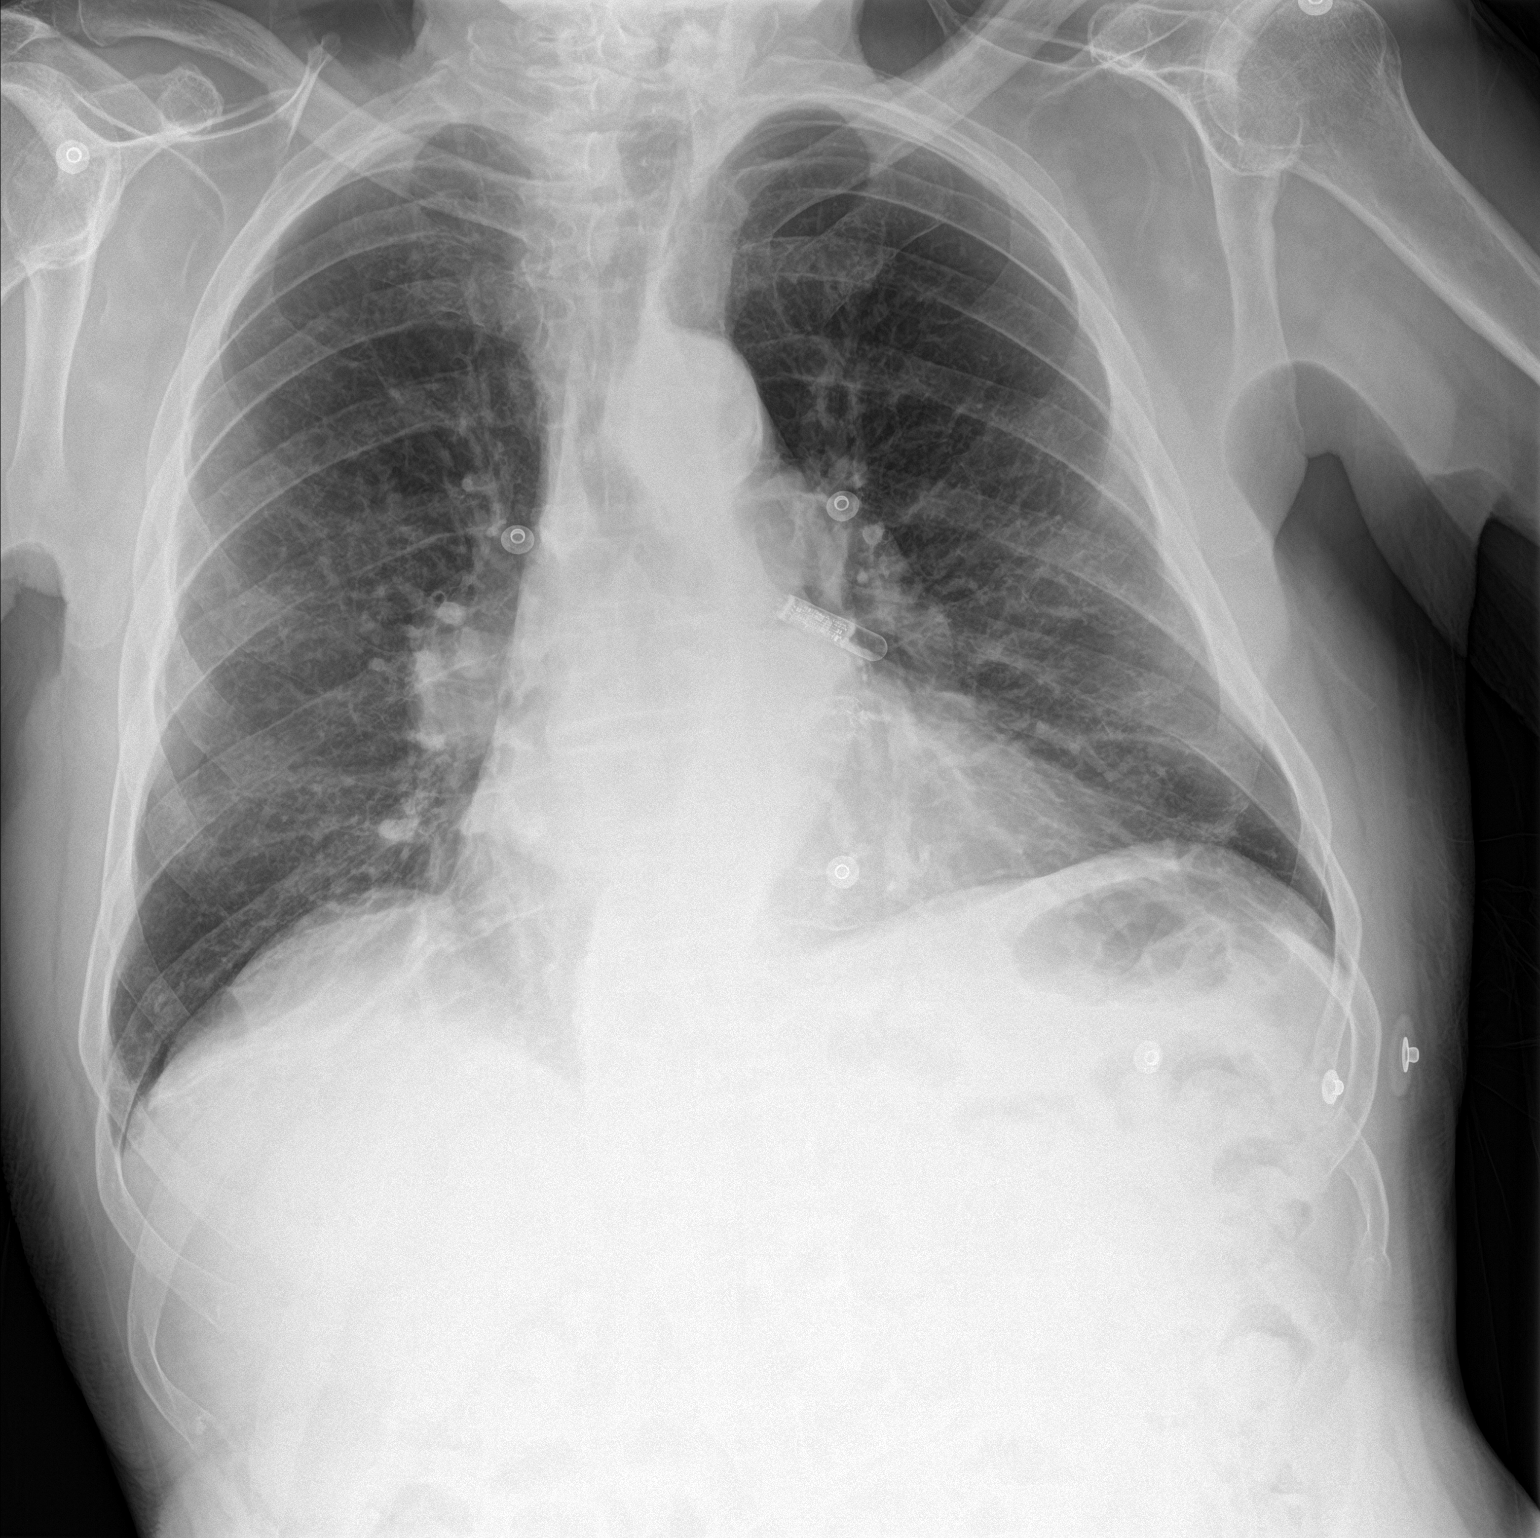

[chest lat]
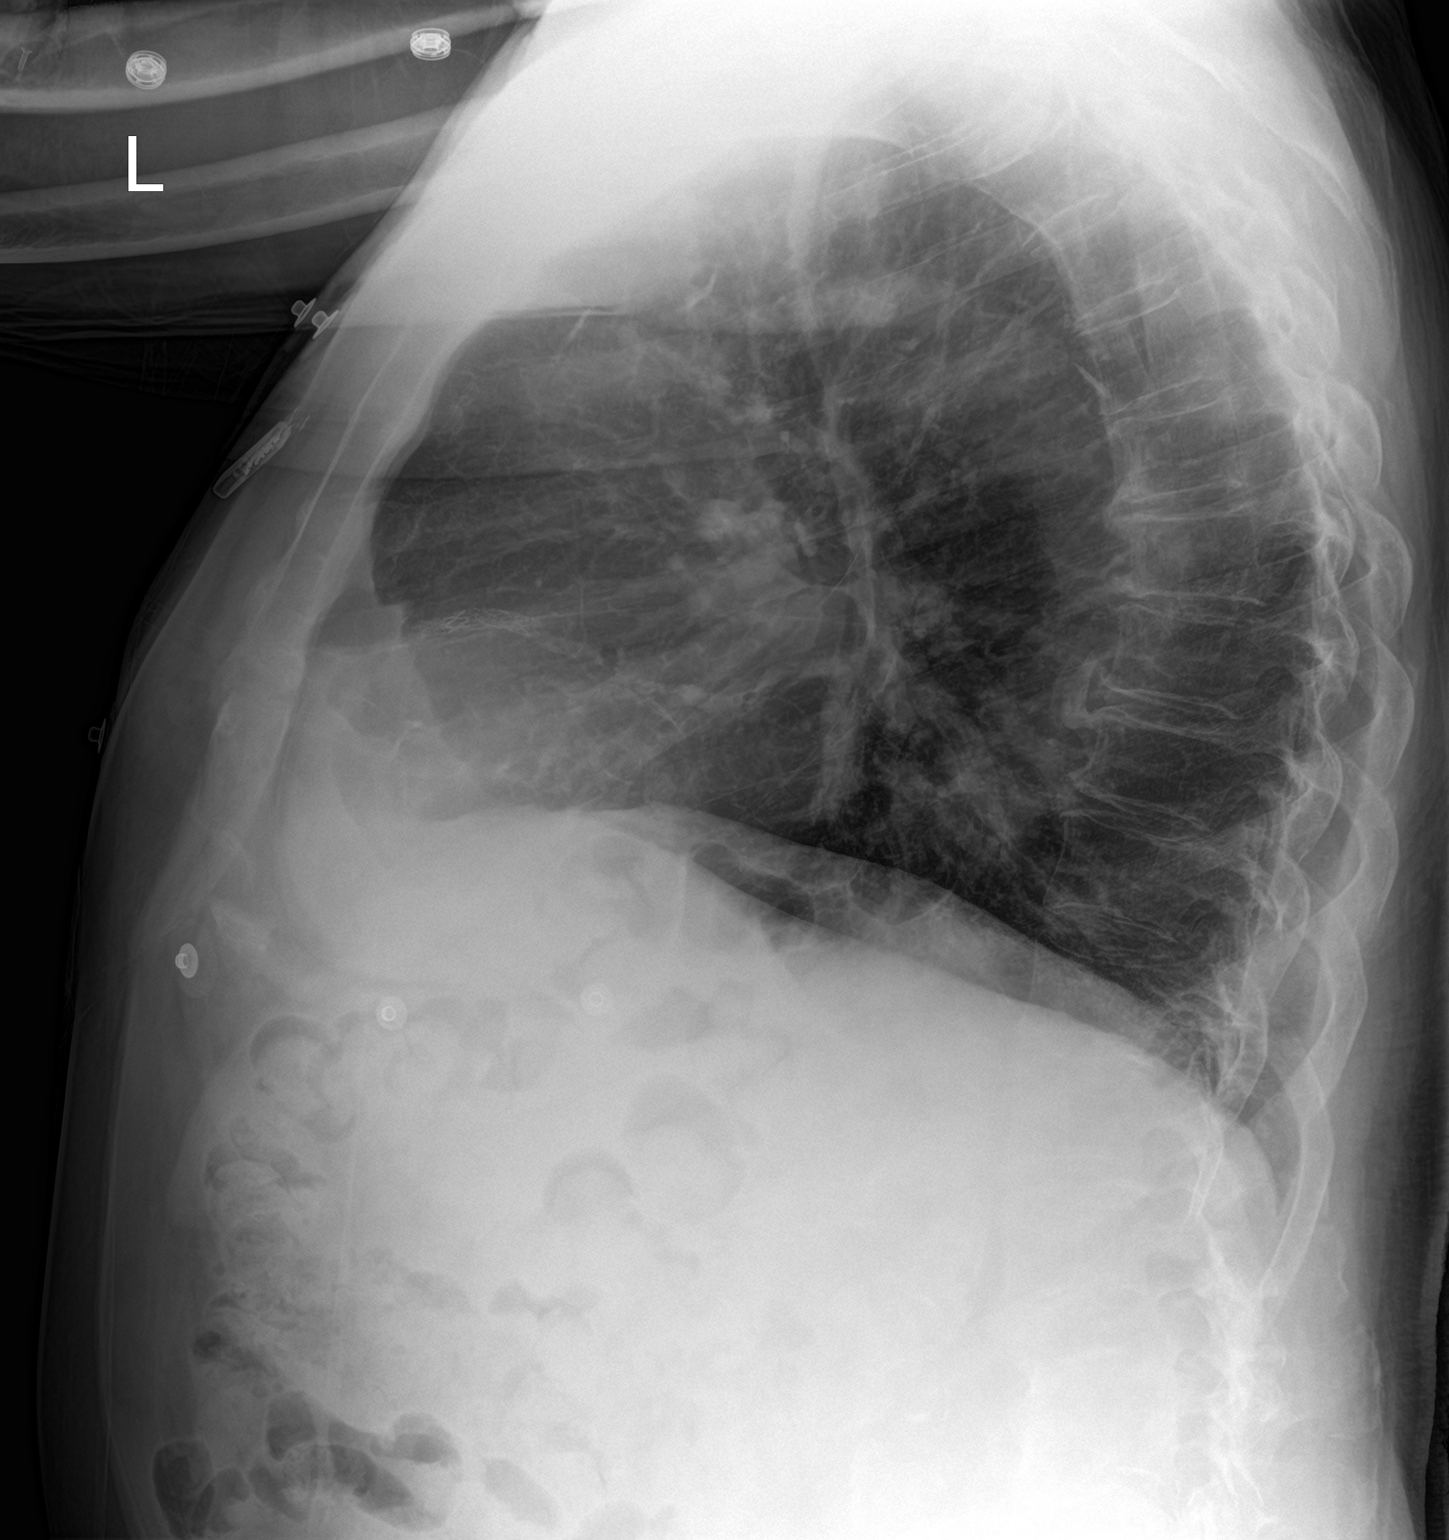

[2 of 2 positions shown; findings below may reference images not displayed]

FINDINGS: Cardiomediastinal contours are unchanged, mild cardiomegaly.
Tortuosity of the thoracic aorta is again seen. Coronary artery
calcification versus stent again seen. Monitoring device projects
over the left chest wall. Improved left basilar aeration with
decreased atelectasis. No consolidation, pleural effusion or
pneumothorax. Mild hyperinflation. Degenerative change and scoliosis
in the spine.
IMPRESSION: No acute pulmonary process.

## 2017-04-20 ENCOUNTER — Encounter: Payer: Self-pay | Admitting: Cardiology

## 2017-04-20 ENCOUNTER — Ambulatory Visit: Payer: Medicare Other | Admitting: Cardiology

## 2017-04-20 VITALS — BP 120/80 | HR 72 | Ht 70.0 in | Wt 182.0 lb

## 2017-04-20 DIAGNOSIS — I5022 Chronic systolic (congestive) heart failure: Secondary | ICD-10-CM | POA: Diagnosis not present

## 2017-04-20 DIAGNOSIS — I350 Nonrheumatic aortic (valve) stenosis: Secondary | ICD-10-CM

## 2017-04-20 DIAGNOSIS — E7800 Pure hypercholesterolemia, unspecified: Secondary | ICD-10-CM | POA: Diagnosis not present

## 2017-04-20 DIAGNOSIS — I2510 Atherosclerotic heart disease of native coronary artery without angina pectoris: Secondary | ICD-10-CM

## 2017-04-20 NOTE — Patient Instructions (Signed)
Go ahead and see Dr. Burt Knack on April 5 at 10 am  I will see you in 4 months.

## 2017-04-23 ENCOUNTER — Institutional Professional Consult (permissible substitution): Payer: Medicare Other | Admitting: Cardiovascular Disease

## 2017-05-11 ENCOUNTER — Encounter: Payer: Self-pay | Admitting: Cardiovascular Disease

## 2017-05-11 ENCOUNTER — Ambulatory Visit: Payer: Medicare Other | Admitting: Cardiovascular Disease

## 2017-05-11 VITALS — BP 140/80 | HR 67 | Ht 70.0 in | Wt 181.0 lb

## 2017-05-11 DIAGNOSIS — I350 Nonrheumatic aortic (valve) stenosis: Secondary | ICD-10-CM

## 2017-05-11 NOTE — Patient Instructions (Addendum)
Medication Instructions:  Your provider recommends that you continue on your current medications as directed. Please refer to the Current Medication list given to you today.    Labwork: None today  Testing/Procedures: None today   Follow-Up: Please call us if you decide to proceed with testing after you see your dentist! Valetta Fuller, RN: [phone] Ander Purpura, RN: [phone]  Any Other Special Instructions Will Be Listed Below (If Applicable).

## 2017-05-11 NOTE — Progress Notes (Signed)
Cardiology Office Note Date:  05/13/2017   ID:  Jonathan Acevedo, DOB 11-12-1928, MRN [id number]  PCP:  Lawerance Cruel, MD  Cardiologist:  [name], MD  Chief Complaint  Patient presents with  . Shortness of Breath     History of Present Illness: Jonathan Acevedo is a 84 y.o. male who presents for evaluation of severe aortic stenosis,  Referred by Dr. Martinique.  The patient is here with his daughter today.  He has a history of coronary artery disease and remote anterior wall MI in 1998 treated with stenting of the LAD.  Other chronic comorbid medical conditions include hypertension, chronic kidney disease, and hyperlipidemia.  The patient's cardiomyopathy has been followed over time with an LVEF in the range of 35-40%.  More recently there has been some decline in LV systolic function and worsening of aortic stenosis.  His LVEF on most recent echo was decreased to 25-30% and aortic stenosis had progressed into the severe range with a mean gradient of 40 mmHg.  The patient has also been noted to have worsening pulmonary hypertension.  He underwent recent cardiac catheterization confirming severe aortic stenosis with a mean transvalvular gradient of 32 mmHg and aortic valve area less than 1 cm.  The patient's LAD stent was patent and his coronary arteries demonstrated mild nonobstructive CAD.  He is independent and active. He still drives, does yard work, and swims. He has some shortness of breath with moderate activity, but otherwise feels well. He primarily is limited by back pain and lumbar problems. He denies chest pain, edema, orthopnea, or PND. No recent lightheadedness or syncope.   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 He then went on to work at [employer] here in [place] for many years until his retirement. He is a widower for 2 years.   Past Medical History:  Diagnosis Date  . Afib ([place])   . Aortic stenosis   . Arthritis   . Cataract   . CHF  (congestive heart failure) ([place])   . Coronary artery disease    w remote anterior myocardial infarction (1998)  . GERD (gastroesophageal reflux disease)   . Hypercalcemia 12/26/2011  . Hypercholesteremia   . Hypertension   . Inguinal hernia    right  . Kidney stones   . Myocardial infarct, old    3 times   . 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 Rectal polyp   . Renal insufficiency   . Syncope 05/18/2013  . Wears glasses     Past Surgical History:  Procedure Laterality Date  . CARDIAC CATHETERIZATION  08/18/2008   LMain 20, LAD stent 20 ISR, CFX 10, RCA 20, EF 45  . CATARACT EXTRACTION    . COLONOSCOPY W/ BIOPSIES AND POLYPECTOMY    . CORONARY STENT PLACEMENT  07/19/1996   Proximal LAD  . ELECTROPHYSIOLOGY STUDY  05-19-13   EPS for syncope without inducible arrhythmias by Dr Lovena Le  . ELECTROPHYSIOLOGY STUDY N/A 05/19/2013   Procedure: ELECTROPHYSIOLOGY STUDY;  Surgeon: Evans Lance, MD;  Location: [facility] CATH LAB;  Service: Cardiovascular;  Laterality: N/A;  . FRACTURE SURGERY     right hip  . INGUINAL HERNIA REPAIR     left  . INGUINAL HERNIA REPAIR Right 02/09/2015   Procedure: RIGHT INGUINAL HERNIA REPAIR W/ MESH;  Surgeon: Jackolyn Confer, MD;  Location: [facility];  Service: General;  Laterality: Right;  inguinal hernia  . INSERTION OF MESH Right 02/09/2015  Procedure: INSERTION OF MESH to right inguinal hernia;  Surgeon: Jackolyn Confer, MD;  Location: [facility];  Service: General;  Laterality: Right;  . LEFT HEART CATHETERIZATION WITH CORONARY ANGIOGRAM N/A 05/19/2013   Procedure: LEFT HEART CATHETERIZATION WITH CORONARY ANGIOGRAM;  Surgeon: Peter M Martinique, MD;  Location: [facility] CATH LAB;  Service: Cardiovascular;  Laterality: N/A;  . LOOP RECORDER IMPLANT  05-19-13   MDT LinQ implanted by Dr Lovena Le for syncope following negative EPS  . ORIF FEMUR FRACTURE     right  . parathyroidectomy    . RIGHT/LEFT HEART CATH AND CORONARY ANGIOGRAPHY N/A 04/06/2017   Procedure: RIGHT/LEFT HEART CATH AND  CORONARY ANGIOGRAPHY;  Surgeon: Martinique, Peter M, MD;  Location: [facility] CV LAB;  Service: Cardiovascular;  Laterality: N/A;  . UMBILICAL HERNIA REPAIR      Current Outpatient Medications  Medication Sig Dispense Refill  . aspirin 81 MG chewable tablet Chew 81 mg by mouth daily.     Marland Kitchen atorvastatin (LIPITOR) 40 MG tablet TAKE 1 TABLET BY MOUTH  DAILY 90 tablet 1  . calcium-vitamin D (OSCAL WITH D) 500-200 MG-UNIT tablet Take 1 tablet by mouth daily with breakfast.    . clonazePAM (KLONOPIN) 0.5 MG tablet Take 0.5 mg by mouth at bedtime.    . furosemide (LASIX) 40 MG tablet TAKE 1 TABLET BY MOUTH IN  THE MORNING AND 1/2 TABLET  IN THE EVENING MAY TAKE ONE MORE TABLET FOR WEIGHT GAIN OR INCREASE SWELLING 180 tablet 2  . nitroGLYCERIN (NITROSTAT) 0.4 MG SL tablet Place 1 tablet (0.4 mg total) under the tongue every 5 (five) minutes as needed for chest pain. 75 tablet 3  . potassium chloride SA (K-DUR,KLOR-CON) 20 MEQ tablet TAKE 1 TABLET BY MOUTH  DAILY 90 tablet 1  . traMADol (ULTRAM) 50 MG tablet Take 50-100 mg by mouth every 6 (six) hours as needed for moderate pain.     . vitamin B-12 (CYANOCOBALAMIN) 1000 MCG tablet Take 1,000 mcg by mouth every morning.     No current facility-administered medications for this visit.     Allergies:   Ibandronic acid; Risedronate sodium; and Flomax [tamsulosin hcl]   Social History:  The patient  reports that he quit smoking about 20 years ago. His smoking use included cigarettes. He has a 45.00 pack-year smoking history. He has never used smokeless tobacco. He reports that he does not drink alcohol or use drugs.   Family History:  The patient's FHx is negative for aortic valve disease  ROS:  Please see the history of present illness.  All other systems are reviewed and negative.    PHYSICAL EXAM: VS:  BP 140/80   Pulse 67   Ht 5\' 10"  (1.778 m)   Wt 181 lb (82.1 kg)   BMI 25.97 kg/m  , BMI Body mass index is 25.97 kg/m. GEN: Well nourished,  well developed, pleasant elderly male in no acute distress  HEENT: normal except for poor dentition Neck: no JVD, no masses. BL carotid bruits Cardiac: RRR with 3/6 harsh late peaking systolic murmur at the RUSB            Respiratory:  clear to auscultation bilaterally, normal work of breathing GI: soft, nontender, nondistended, + BS MS: no deformity or atrophy  Ext: no pretibial edema Skin: warm and dry, no rash Neuro:  Strength and sensation are intact Psych: euthymic mood, full affect  EKG:  EKG is not ordered today.  Recent Labs: 03/30/2017: BUN 30; Creatinine, Ser 1.52;  Hemoglobin 12.4; Platelets 178; Potassium 4.3; Sodium 143   Lipid Panel     Component Value Date/Time   CHOL 128 03/30/2017 0906   TRIG 87 03/30/2017 0906   HDL 69 03/30/2017 0906   CHOLHDL 2.3 01/25/2016 1151   VLDL 23 01/25/2016 1151   LDLCALC 42 03/30/2017 0906      Wt Readings from Last 3 Encounters:  05/11/17 181 lb (82.1 kg)  04/20/17 182 lb (82.6 kg)  04/06/17 182 lb (82.6 kg)     Cardiac Studies Reviewed: 2D Echo 03-15-2017: Left ventricle:  The cavity size was mildly dilated. There was moderate concentric hypertrophy. Systolic function was severely reduced. The estimated ejection fraction was in the range of 25% to 30%.  Regional wall motion abnormalities:  Diffuse hypokinesis with akinesis of the mid-apical anteroseptal myocardium. Doppler parameters are consistent with a reversible restrictive pattern, indicative of decreased left ventricular diastolic compliance and/or increased left atrial pressure (grade 3 diastolic dysfunction). Doppler parameters are consistent with high ventricular filling pressure.  ------------------------------------------------------------------- Aortic valve:   Trileaflet; severely thickened, severely calcified leaflets. Valve mobility was restricted.  Doppler:   There was severe stenosis.   There was mild regurgitation.    VTI ratio of LVOT to aortic  valve: 0.25. Valve area (VTI): 0.85 cm^2. Indexed valve area (VTI): 0.41 cm^2/m^2. Peak velocity ratio of LVOT to aortic valve: 0.25. Valve area (Vmax): 0.86 cm^2. Indexed valve area (Vmax): 0.42 cm^2/m^2. Mean velocity ratio of LVOT to aortic valve: 0.25. Valve area (Vmean): 0.87 cm^2. Indexed valve area (Vmean): 0.42 cm^2/m^2.    Mean gradient (S): 40 mm Hg. Peak gradient (S): 62 mm Hg.  ------------------------------------------------------------------- Aorta:  Aortic root: The aortic root was normal in size. Ascending aorta: The ascending aorta was mildly dilated.  ------------------------------------------------------------------- Mitral valve:  Severely calcified annulus, mostly posterior. Mobility of the posterior leaflet was restricted.  Doppler: Transvalvular velocity was within the normal range. There was no evidence for stenosis. There was trivial regurgitation.    Peak gradient (D): 7 mm Hg.  ------------------------------------------------------------------- Left atrium:  The atrium was severely dilated.  ------------------------------------------------------------------- Right ventricle:  The cavity size was mildly dilated. Wall thickness was normal. Systolic function was normal.  ------------------------------------------------------------------- Pulmonic valve:    Doppler:  Transvalvular velocity was within the normal range. There was no evidence for stenosis.  ------------------------------------------------------------------- Tricuspid valve:   Structurally normal valve.    Doppler: Transvalvular velocity was within the normal range. There was mild regurgitation.  ------------------------------------------------------------------- Pulmonary artery:   The main pulmonary artery was normal-sized. Systolic pressure was severely increased.  ------------------------------------------------------------------- Right atrium:  The atrium was mildly  dilated.  ------------------------------------------------------------------- Pericardium:  There was no pericardial effusion.  ------------------------------------------------------------------- Systemic veins: Inferior vena cava: The vessel was dilated. The respirophasic diameter changes were blunted (< 50%), consistent with elevated central venous pressure.  ------------------------------------------------------------------- Measurements   Left ventricle                            Value          Reference  LV ID, ED, PLAX chordal           (H)     54.5  mm       43 - 52  LV ID, ES, PLAX chordal           (H)     42.7  mm       23 - 38  LV fx shortening,  PLAX chordal    (L)     22    %        >=29  LV PW thickness, ED                       14.1  mm       ---------  IVS/LV PW ratio, ED                       0.97           <=1.3  Stroke volume, 2D                         78    ml       ---------  Stroke volume/bsa, 2D                     38    ml/m^2   ---------  LV e&', lateral                            6.58  cm/s     ---------  LV E/e&', lateral                          19.6           ---------  LV e&', medial                             5.37  cm/s     ---------  LV E/e&', medial                           24.02          ---------  LV e&', average                            5.98  cm/s     ---------  LV E/e&', average                          21.59          ---------    Ventricular septum                        Value          Reference  IVS thickness, ED                         13.7  mm       ---------    LVOT                                      Value          Reference  LVOT ID, S                                21    mm       ---------  LVOT area  3.46  cm^2     ---------  LVOT ID                                   21    mm       ---------  LVOT peak velocity, S                     97.7  cm/s     ---------  LVOT mean velocity, S                      76.2  cm/s     ---------  LVOT VTI, S                               22.5  cm       ---------  LVOT peak gradient, S                     4     mm Hg    ---------  Stroke volume (SV), LVOT DP               77.9  ml       ---------  Stroke index (SV/bsa), LVOT DP            37.8  ml/m^2   ---------    Aortic valve                              Value          Reference  Aortic valve peak velocity, S             392   cm/s     ---------  Aortic valve mean velocity, S             303   cm/s     ---------  Aortic valve VTI, S                       91.4  cm       ---------  Aortic mean gradient, S                   40    mm Hg    ---------  Aortic peak gradient, S                   62    mm Hg    ---------  VTI ratio, LVOT/AV                        0.25           ---------  Aortic valve area, VTI                    0.85  cm^2     ---------  Aortic valve area/bsa, VTI                0.41  cm^2/m^2 ---------  Velocity ratio, peak, LVOT/AV             0.25           ---------  Aortic valve area, peak velocity          0.86  cm^2     ---------  Aortic valve area/bsa, peak               0.42  cm^2/m^2 ---------  velocity  Velocity ratio, mean, LVOT/AV             0.25           ---------  Aortic valve area, mean velocity          0.87  cm^2     ---------  Aortic valve area/bsa, mean               0.42  cm^2/m^2 ---------  velocity    Aorta                                     Value          Reference  Aortic root ID, ED                        35    mm       ---------  Ascending aorta ID, A-P, S                37    mm       ---------    Left atrium                               Value          Reference  LA ID, A-P, ES                            59    mm       ---------  LA ID/bsa, A-P                    (H)     2.86  cm/m^2   <=2.2  LA volume, S                              144   ml       ---------  LA volume/bsa, S                          69.9  ml/m^2   ---------  LA volume, ES, 1-p A4C                     117   ml       ---------  LA volume/bsa, ES, 1-p A4C                56.8  ml/m^2   ---------  LA volume, ES, 1-p A2C                    173   ml       ---------  LA volume/bsa, ES, 1-p A2C                84    ml/m^2   ---------    Mitral valve                              Value  Reference  Mitral E-wave peak velocity               129   cm/s     ---------  Mitral A-wave peak velocity               61.4  cm/s     ---------  Mitral deceleration time                  155   ms       150 - 230  Mitral peak gradient, D                   7     mm Hg    ---------  Mitral E/A ratio, peak                    2.1            ---------    Pulmonary arteries                        Value          Reference  PA pressure, S, DP                (H)     82    mm Hg    <=30    Tricuspid valve                           Value          Reference  Tricuspid regurg peak velocity            408   cm/s     ---------  Tricuspid peak RV-RA gradient             67    mm Hg    ---------    Systemic veins                            Value          Reference  Estimated CVP                             15    mm Hg    ---------    Right ventricle                           Value          Reference  RV pressure, S, DP                (H)     82    mm Hg    <=30  RV s&', lateral, S                         14.3  cm/s     ---------  Cardiac Cath 04-06-2017: Conclusion     Ost LM lesion is 30% stenosed.  Prox LAD lesion is 20% stenosed.  LV end diastolic pressure is moderately elevated.  There is severe aortic valve stenosis.  Hemodynamic findings consistent with moderate pulmonary hypertension.   1. Nonobstructive CAD. Old stent in LAD is patent 2. Moderate pulmonary venous HTN. Mean PAP 40 mm Hg 3. Moderately elevated LV filling pressures.  4. Severe Aortic stenosis. AV mean gradient 32 mm Hg, AV area 0.98 cm squared. Gradient relatively lower due to low EF.   Plan: refer to valve clinic  for consideration of TAVR.    Indications   Nonrheumatic aortic valve stenosis [I35.0 (ICD-10-CM)]  Procedural Details/Technique   Technical Details Indication: 84 yo WM with progressive aortic stenosis. EF 25-30% by Echo  Procedural Details: The right wrist was prepped, draped, and anesthetized with 1% lidocaine. Using the modified Seldinger technique a 6 Fr slender sheath was placed in the right radial artery and a 5 French sheath was placed in the right brachial vein. A Swan-Ganz catheter was used for the right heart catheterization. Standard protocol was followed for recording of right heart pressures and sampling of oxygen saturations. Fick cardiac output was calculated. Standard Judkins catheters were used for selective coronary angiography and left ventriculography. There were no immediate procedural complications. The patient was transferred to the post catheterization recovery area for further monitoring.  Contrast: 50 cc   Estimated blood loss <50 mL.  During this procedure the patient was administered the following to achieve and maintain moderate conscious sedation: Versed 1 mg, Fentanyl 25 mcg, while the patient's heart rate, blood pressure, and oxygen saturation were continuously monitored. The period of conscious sedation was 36 minutes, of which I was present face-to-face 100% of this time.  Complications   Complications documented before study signed (04/06/2017 11:21 AM EST)    No complications were associated with this study.  Documented by Martinique, Peter M, MD - 04/06/2017 11:18 AM EST    Coronary Findings   Diagnostic  Dominance: Right  Left Main  Ost LM lesion 30% stenosed  Ost LM lesion is 30% stenosed.  Left Anterior Descending  Prox LAD lesion 20% stenosed  Prox LAD lesion is 20% stenosed. The lesion was previously treated using a stent (unknown type) over 2 years ago.  Left Circumflex  Vessel was injected. Vessel is large. The vessel exhibits minimal luminal  irregularities. The vessel is mildly calcified.  Right Coronary Artery  Vessel was injected. Vessel is large. There is mild diffuse disease throughout the vessel. The vessel is mildly calcified.  Intervention   No interventions have been documented.  Right Heart   Right Heart Pressures Hemodynamic findings consistent with moderate pulmonary hypertension.  Left Heart   Left Ventricle LV end diastolic pressure is moderately elevated.  Aortic Valve There is severe aortic valve stenosis. The aortic valve is calcified.  Coronary Diagrams   Diagnostic Diagram        STS risk calculator: Risk of Mortality: 3.121% Renal Failure: 3.279% Permanent Stroke: 1.933% Prolonged Ventilation: 11.673% DSW Infection: 0.084% Reoperation: 5.181% Morbidity or Mortality: 18.112% Short Length of Stay: 21.574% Long Length of Stay: 9.874%  ASSESSMENT AND PLAN: This is an 84 yo male with severe, Stage D1 aortic stenosis with associated chronic systolic heart failure with LVEF 25%. The patient is noted to have progressive LV systolic dysfunction over serial echo studies and even in the setting of severe LV dysfunction, his mean transaortic gradient is 40 mHg. I have reviewed the natural history of aortic stenosis with the patient and their family members who are present today. We have discussed the limitations of medical therapy and the poor prognosis associated with symptomatic aortic stenosis. We have reviewed potential treatment options, including palliative medical therapy, conventional surgical aortic valve replacement, and transcatheter aortic valve replacement. We discussed treatment options in the context of the patient's specific comorbid medical conditions.  Considering his advanced age, chronic kidney disease, and severe LV dysfunction, TAVR appears to be a reasonable treatment option. We reviewed the TAVR surgery at length today, including preoperative evaluation, surgical risks and  expectations, and typical recovery.  The patient has been advised of a variety of complications that might develop including but not limited to risks of death, stroke, paravalvular leak, aortic dissection or other major vascular complications, aortic annulus rupture, device embolization, cardiac rupture or perforation, mitral regurgitation, acute myocardial infarction, arrhythmia, heart block or bradycardia requiring permanent pacemaker placement, congestive heart failure, respiratory failure, renal failure, pneumonia, infection, other late complications related to structural valve deterioration or migration, or other complications that might ultimately cause a temporary or permanent loss of functional independence or other long term morbidity.   I have personally reviewed his recent cath and echo studies which both confirm severe aortic stenosis and demonstrate no obstructive CAD. He has poor dentition and will see his dentist next week. He understands that he will need dental clearance prior to TAVR and may ultimately require extraction of his remaining teeth. He will then undergo CTA studies of the heart and the chest/abdomen/pelvis. Finally he will undergo formal cardiac surgical evaluation as part of a multidisciplinary evaluation.   Current medicines are reviewed with the patient today.  The patient does not have concerns regarding medicines.  Labs/ tests ordered today include:  No orders of the defined types were placed in this encounter.   Disposition:   FU pending further test results.  Signed, Sherren Mocha, MD  05/13/2017 11:39 PM    [address], [place], [place]  [address] Phone: [phone]; Fax: [phone]

## 2017-05-21 ENCOUNTER — Telehealth: Payer: Self-pay

## 2017-05-21 NOTE — Telephone Encounter (Signed)
Katharine Look contacted me last week to discuss the pt's concerns with cost of TAVR work up, surgery and hospitalization. The pt would like to have a "ball park" figure of what TAVR will cost.  I made Katharine Look aware that I do not have this information but I will work with our billing team to try and assist with getting additional information. I spoke with our billing department today and was able to find out additional information and make Katharine Look aware.  The pt will need CPT codes when contacting insurance.  [id number] CTA Chest [id number] CTA Abd/Pelvis [id number] Coronary CTA 62863 Carotid duplex 94010 PFT [id number] TAVR Surgery  Lucy Chris with [facility] will contact Katharine Look to discuss estimates of physician charges. For Estimates with hospital pricing I provided Katharine Look with the number for Pre Service [phone]. Katharine Look will assist the pt with trying to get an estimate for TAVR costs.  Katharine Look will contact me if the pt would like to proceed with TAVR scheduling.

## 2017-06-19 NOTE — Telephone Encounter (Signed)
I spoke with Katharine Look and she states to her knowledge the pt  did not see the dentist on 5/8 for evaluation.  She thinks the pt rescheduled this appointment or may be planning to see a new dentist.  The pt also has not found out any additional information about cost of TAVR.  Katharine Look will touch base with the pt to let him know that I reached out to see how he is doing. Katharine Look said the pt has been fine and she will try to contact me next week with an update on the pt's plan to pursue TAVR.

## 2017-06-27 ENCOUNTER — Other Ambulatory Visit: Payer: Self-pay | Admitting: Cardiology

## 2017-06-27 NOTE — Telephone Encounter (Signed)
REFILL 

## 2017-07-16 NOTE — Telephone Encounter (Signed)
Left message on pt's home voicemail to get an update on plans for proceeding with TAVR evaluation. The pt has a pending apt with Dr Martinique on 08/07/17.

## 2017-07-30 ENCOUNTER — Emergency Department (HOSPITAL_COMMUNITY)
Admission: EM | Admit: 2017-07-30 | Discharge: 2017-07-31 | Disposition: A | Discharge status: Home / Self Care | Payer: Medicare Other | Attending: Emergency Medicine | Admitting: Emergency Medicine

## 2017-07-30 ENCOUNTER — Encounter (HOSPITAL_COMMUNITY): Payer: Self-pay | Admitting: Emergency Medicine

## 2017-07-30 DIAGNOSIS — I110 Hypertensive heart disease with heart failure: Secondary | ICD-10-CM | POA: Insufficient documentation

## 2017-07-30 DIAGNOSIS — Z7982 Long term (current) use of aspirin: Secondary | ICD-10-CM | POA: Insufficient documentation

## 2017-07-30 DIAGNOSIS — I509 Heart failure, unspecified: Secondary | ICD-10-CM | POA: Diagnosis not present

## 2017-07-30 DIAGNOSIS — T675XXA Heat exhaustion, unspecified, initial encounter: Secondary | ICD-10-CM | POA: Diagnosis not present

## 2017-07-30 DIAGNOSIS — Z79899 Other long term (current) drug therapy: Secondary | ICD-10-CM | POA: Diagnosis not present

## 2017-07-30 DIAGNOSIS — I2510 Atherosclerotic heart disease of native coronary artery without angina pectoris: Secondary | ICD-10-CM | POA: Insufficient documentation

## 2017-07-30 DIAGNOSIS — R05 Cough: Secondary | ICD-10-CM

## 2017-07-30 DIAGNOSIS — Z87891 Personal history of nicotine dependence: Secondary | ICD-10-CM | POA: Insufficient documentation

## 2017-07-30 DIAGNOSIS — R1012 Left upper quadrant pain: Secondary | ICD-10-CM | POA: Diagnosis present

## 2017-07-30 DIAGNOSIS — R059 Cough, unspecified: Secondary | ICD-10-CM

## 2017-07-30 LAB — CBC
HEMATOCRIT: 36.5 % — AB (ref 39.0–52.0)
HEMOGLOBIN: 11.7 g/dL — AB (ref 13.0–17.0)
MCH: 28.9 pg (ref 26.0–34.0)
MCHC: 32.1 g/dL (ref 30.0–36.0)
MCV: 90.1 fL (ref 78.0–100.0)
Platelets: 165 10*3/uL (ref 150–400)
RBC: 4.05 MIL/uL — AB (ref 4.22–5.81)
RDW: 15.7 % — ABNORMAL HIGH (ref 11.5–15.5)
WBC: 6.6 10*3/uL (ref 4.0–10.5)

## 2017-07-30 LAB — LIPASE, BLOOD: Lipase: 27 U/L (ref 11–51)

## 2017-07-30 LAB — COMPREHENSIVE METABOLIC PANEL
ALK PHOS: 106 U/L (ref 38–126)
ALT: 12 U/L — ABNORMAL LOW (ref 17–63)
ANION GAP: 11 (ref 5–15)
AST: 16 U/L (ref 15–41)
Albumin: 3.6 g/dL (ref 3.5–5.0)
BUN: 28 mg/dL — ABNORMAL HIGH (ref 6–20)
CALCIUM: 8.9 mg/dL (ref 8.9–10.3)
CO2: 26 mmol/L (ref 22–32)
Chloride: 104 mmol/L (ref 101–111)
Creatinine, Ser: 1.55 mg/dL — ABNORMAL HIGH (ref 0.61–1.24)
GFR calc non Af Amer: 39 mL/min — ABNORMAL LOW (ref >60)
GFR, EST AFRICAN AMERICAN: 45 mL/min — AB (ref >60)
Glucose, Bld: 104 mg/dL — ABNORMAL HIGH (ref 65–99)
POTASSIUM: 3.7 mmol/L (ref 3.5–5.1)
SODIUM: 141 mmol/L (ref 135–145)
TOTAL PROTEIN: 7.2 g/dL (ref 6.5–8.1)
Total Bilirubin: 1.6 mg/dL — ABNORMAL HIGH (ref 0.3–1.2)

## 2017-07-30 NOTE — ED Notes (Signed)
Pt is aware a urine sample is needed, but is unable to provide one at this time. Specimen cup provided to Pt in lobby.

## 2017-07-30 NOTE — ED Triage Notes (Signed)
Patient c/o upper abdominal pain with nausea and productive cough x3 days. Seen at [facility] today and told to come for fluids. Denies diarrhea and vomiting.

## 2017-07-31 ENCOUNTER — Emergency Department (HOSPITAL_COMMUNITY): Payer: Medicare Other

## 2017-07-31 LAB — URINALYSIS, ROUTINE W REFLEX MICROSCOPIC
BILIRUBIN URINE: NEGATIVE
Bacteria, UA: NONE SEEN
Glucose, UA: NEGATIVE mg/dL
Ketones, ur: NEGATIVE mg/dL
Leukocytes, UA: NEGATIVE
NITRITE: NEGATIVE
PROTEIN: NEGATIVE mg/dL
Specific Gravity, Urine: 1.016 (ref 1.005–1.030)
pH: 6 (ref 5.0–8.0)

## 2017-07-31 MED ORDER — SODIUM CHLORIDE 0.9 % IV BOLUS
250.0000 mL | Freq: Once | INTRAVENOUS | Status: AC
  Administered 2017-07-31: 250 mL via INTRAVENOUS

## 2017-07-31 NOTE — ED Notes (Signed)
Patient transported to X-ray 

## 2017-07-31 NOTE — ED Provider Notes (Signed)
[facility] DEPT Provider Note: Georgena Spurling, MD, FACEP  CSN: [id number] MRN: [id number] ARRIVAL: 07/30/17 at 2126 ROOM: [place]  Abdominal Pain   HISTORY OF PRESENT ILLNESS  07/31/17 12:01 AM Jonathan Acevedo is a 84 y.o. male states he has been out in the sun for the past 2 days and thinks he got overheated.  He has had general malaise with a cough for the past 3 days and was seen at an [facility] yesterday.  He states "something" was found on her chest x-ray and he was started on amoxicillin.  Yesterday evening he was having generalized weakness with left upper quadrant abdominal pain with nausea but no vomiting or diarrhea.  He denies pain or nausea now and his weakness has improved.  He is tearful at times and admits to grieving over his wife who died 2 years ago.  He states he often feels lonely and grief ridden at night due to the loneliness.  He denies any suicidal ideation.  He was advised by the [facility] clinic that his blood pressure was low earlier and he should go to the ED for IV fluids.  He denies shortness of breath or chest pain.   Past Medical History:  Diagnosis Date  . Afib ([place])   . Aortic stenosis   . Arthritis   . Cataract   . CHF (congestive heart failure) ([place])   . Coronary artery disease    w remote anterior myocardial infarction (1998)  . GERD (gastroesophageal reflux disease)   . Hypercalcemia 12/26/2011  . Hypercholesteremia   . Hypertension   . Inguinal hernia    right  . Kidney stones   . Myocardial infarct, old    3 times   . 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 Rectal polyp   . Renal insufficiency   . Syncope 05/18/2013  . Wears glasses     Past Surgical History:  Procedure Laterality Date  . CARDIAC CATHETERIZATION  08/18/2008   LMain 20, LAD stent 20 ISR, CFX 10, RCA 20, EF 45  . CATARACT EXTRACTION    . COLONOSCOPY W/ BIOPSIES AND POLYPECTOMY    . CORONARY STENT PLACEMENT  07/19/1996   Proximal LAD  .  ELECTROPHYSIOLOGY STUDY  05-19-13   EPS for syncope without inducible arrhythmias by Dr Lovena Le  . ELECTROPHYSIOLOGY STUDY N/A 05/19/2013   Procedure: ELECTROPHYSIOLOGY STUDY;  Surgeon: Evans Lance, MD;  Location: [facility] CATH LAB;  Service: Cardiovascular;  Laterality: N/A;  . FRACTURE SURGERY     right hip  . INGUINAL HERNIA REPAIR     left  . INGUINAL HERNIA REPAIR Right 02/09/2015   Procedure: RIGHT INGUINAL HERNIA REPAIR W/ MESH;  Surgeon: Jackolyn Confer, MD;  Location: [facility];  Service: General;  Laterality: Right;  inguinal hernia  . INSERTION OF MESH Right 02/09/2015   Procedure: INSERTION OF MESH to right inguinal hernia;  Surgeon: Jackolyn Confer, MD;  Location: [facility];  Service: General;  Laterality: Right;  . LEFT HEART CATHETERIZATION WITH CORONARY ANGIOGRAM N/A 05/19/2013   Procedure: LEFT HEART CATHETERIZATION WITH CORONARY ANGIOGRAM;  Surgeon: Peter M Martinique, MD;  Location[facility] C[facility] LAB;  Service: Cardiovascular;  Laterality: N/A;  . LOOP RECORDER IMPLANT  05-19-13   MDT LinQ implanted by Dr Lovena Le for syncope following negative EPS  . ORIF FEMUR FRACTURE     right  . parathyroidectomy    . RIGHT/LEFT HEART CATH AND CORONARY ANGIOGRAPHY N/A 04/06/2017   Procedure: RIGHT/LEFT HEART CATH AND  CORONARY ANGIOGRAPHY;  Surgeon: Martinique, Peter M, MD;  Location: [facility] CV LAB;  Service: Cardiovascular;  Laterality: N/A;  . UMBILICAL HERNIA REPAIR      No family history on file.  Social History   Tobacco Use  . Smoking status: Former Smoker    Packs/day: 1.00    Years: 45.00    Pack years: 45.00    Types: Cigarettes    Last attempt to quit: 07/19/1996    Years since quitting: 21.0  . Smokeless tobacco: Never Used  Substance Use Topics  . Alcohol use: No  . Drug use: No    Prior to Admission medications   Medication Sig Start Date End Date Taking? Authorizing Provider  amoxicillin (AMOXIL) 875 MG tablet Take 875 mg by mouth 2 (two) times daily. 07/30/17  Yes [provider]  aspirin 81 MG chewable tablet Chew 81 mg by mouth daily.    Yes [provider]  atorvastatin (LIPITOR) 40 MG tablet TAKE 1 TABLET BY MOUTH  DAILY 06/27/17  Yes Martinique, Peter M, MD  calcium-vitamin D (OSCAL WITH D) 500-200 MG-UNIT tablet Take 1 tablet by mouth daily with breakfast.   Yes [provider]  clonazePAM (KLONOPIN) 0.5 MG tablet Take 0.5 mg by mouth at bedtime.   Yes [provider]  furosemide (LASIX) 40 MG tablet TAKE 1 TABLET BY MOUTH IN  THE MORNING AND 1/2 TABLET  IN THE EVENING MAY TAKE ONE MORE TABLET FOR WEIGHT GAIN OR INCREASE SWELLING 09/13/16  Yes Martinique, Peter M, MD  magnesium oxide (MAG-OX) 400 MG tablet Take 400-800 mg by mouth at bedtime as needed (leg cramps).  07/22/17  Yes [provider]  nitroGLYCERIN (NITROSTAT) 0.4 MG SL tablet Place 1 tablet (0.4 mg total) under the tongue every 5 (five) minutes as needed for chest pain. 02/23/17  Yes Martinique, Peter M, MD  potassium chloride SA (K-DUR,KLOR-CON) 20 MEQ tablet TAKE 1 TABLET BY MOUTH  DAILY 06/27/17  Yes Martinique, Peter M, MD  vitamin B-12 (CYANOCOBALAMIN) 1000 MCG tablet Take 1,000 mcg by mouth every morning.   Yes [provider]  traMADol (ULTRAM) 50 MG tablet Take 50-100 mg by mouth every 6 (six) hours as needed for moderate pain.     [provider]    Allergies Ibandronic acid; Risedronate sodium; and Flomax [tamsulosin hcl]   REVIEW OF SYSTEMS  Negative except as noted here or in the History of Present Illness.   PHYSICAL EXAMINATION  Initial Vital Signs Blood pressure 126/77, pulse 62, temperature 99.3 F (37.4 C), temperature source Oral, resp. rate 15, height 5\' 10"  (1.778 m), weight 78 kg (172 lb), SpO2 90 %.  Examination General: Well-developed, well-nourished male in no acute distress; appearance consistent with age of record HENT: normocephalic; atraumatic Eyes: pupils equal, round and reactive to light; extraocular muscles intact Neck:  supple Heart: regular rate and rhythm; 3 out of 6 systolic murmur at the right upper sternal border Lungs: clear to auscultation bilaterally Abdomen: soft; nondistended; nontender; bowel sounds present Extremities: No deformity; full range of motion; pulses normal; trace edema of the lower legs Neurologic: Awake, alert and oriented; motor function intact in all extremities and symmetric; no facial droop Skin: Warm and dry Psychiatric: Emotionally labile with periods of tearfulness; denies SI   RESULTS  Summary of this visit's results, reviewed by myself:   EKG Interpretation  Date/Time:    Ventricular Rate:    PR Interval:    QRS Duration:   QT  Interval:    QTC Calculation:   R Axis:     Text Interpretation:        Laboratory Studies: Results for orders placed or performed during the hospital encounter of 07/30/17 (from the past 24 hour(s))  Urinalysis, Routine w reflex microscopic     Status: Abnormal   Collection Time: 07/30/17  9:55 PM  Result Value Ref Range   Color, Urine YELLOW YELLOW   APPearance CLEAR CLEAR   Specific Gravity, Urine 1.016 1.005 - 1.030   pH 6.0 5.0 - 8.0   Glucose, UA NEGATIVE NEGATIVE mg/dL   Hgb urine dipstick SMALL (A) NEGATIVE   Bilirubin Urine NEGATIVE NEGATIVE   Ketones, ur NEGATIVE NEGATIVE mg/dL   Protein, ur NEGATIVE NEGATIVE mg/dL   Nitrite NEGATIVE NEGATIVE   Leukocytes, UA NEGATIVE NEGATIVE   RBC / HPF 6-10 0 - 5 RBC/hpf   WBC, UA 0-5 0 - 5 WBC/hpf   Bacteria, UA NONE SEEN NONE SEEN   Mucus PRESENT   Lipase, blood     Status: None   Collection Time: 07/30/17 10:16 PM  Result Value Ref Range   Lipase 27 11 - 51 U/L  Comprehensive metabolic panel     Status: Abnormal   Collection Time: 07/30/17 10:16 PM  Result Value Ref Range   Sodium 141 135 - 145 mmol/L   Potassium 3.7 3.5 - 5.1 mmol/L   Chloride 104 101 - 111 mmol/L   CO2 26 22 - 32 mmol/L   Glucose, Bld 104 (H) 65 - 99 mg/dL   BUN 28 (H) 6 - 20 mg/dL   Creatinine, Ser  1.55 (H) 0.61 - 1.24 mg/dL   Calcium 8.9 8.9 - 10.3 mg/dL   Total Protein 7.2 6.5 - 8.1 g/dL   Albumin 3.6 3.5 - 5.0 g/dL   AST 16 15 - 41 U/L   ALT 12 (L) 17 - 63 U/L   Alkaline Phosphatase 106 38 - 126 U/L   Total Bilirubin 1.6 (H) 0.3 - 1.2 mg/dL   GFR calc non Af Amer 39 (L) >60 mL/min   GFR calc Af Amer 45 (L) >60 mL/min   Anion gap 11 5 - 15  CBC     Status: Abnormal   Collection Time: 07/30/17 10:16 PM  Result Value Ref Range   WBC 6.6 4.0 - 10.5 K/uL   RBC 4.05 (L) 4.22 - 5.81 MIL/uL   Hemoglobin 11.7 (L) 13.0 - 17.0 g/dL   HCT 36.5 (L) 39.0 - 52.0 %   MCV 90.1 78.0 - 100.0 fL   MCH 28.9 26.0 - 34.0 pg   MCHC 32.1 30.0 - 36.0 g/dL   RDW 15.7 (H) 11.5 - 15.5 %   Platelets 165 150 - 400 K/uL   Imaging Studies: Dg Chest 2 View  Result Date: 07/31/2017 CLINICAL DATA:  Productive cough and upper abdominal pain for 3 days. EXAM: CHEST - 2 VIEW COMPARISON:  CXR from 1 day prior. FINDINGS: Stable cardiomegaly with aortic atherosclerosis. Uncoiling of the thoracic aorta is redemonstrated. No confluent airspace disease, effusion or overt pulmonary edema. Mild hilar prominence likely related to central vascular congestion. Loop recording device projects over the left hilum on current exam. IMPRESSION: Stable cardiomegaly with tortuous atherosclerotic aorta. Central vascular congestion. No acute pulmonary consolidation. Electronically Signed   By: Ashley Royalty M.D.   On: 07/31/2017 01:30    ED COURSE and MDM  Nursing notes and initial vitals signs, including pulse oximetry, reviewed.  Vitals:   07/30/17  2158 07/31/17 0023 07/31/17 0030 07/31/17 0145  BP:  133/86 126/86 (!) 143/86  Pulse:  77 73 88  Resp:  16 18 20   Temp:      TempSrc:      SpO2:  94% 95% 95%  Weight: 78 kg (172 lb)     Height: 5\' 10"  (1.778 m)      2:55 AM Patient feeling much better after small IV fluid bolus.  I suspect his symptomatology is due in part to heat exposure.  He also has some chronic grief  issues related to the death of his wife.  He was advised to continue the amoxicillin that was prescribed by his physician yesterday.  I see no indication for further studies or admission at this time.  PROCEDURES    ED DIAGNOSES     ICD-10-CM   1. Heat exhaustion, initial encounter T67.5XXA   2. Cough R05        Jakirah Zaun, Jenny Reichmann, MD 07/31/17 [phone]

## 2017-08-04 NOTE — Progress Notes (Signed)
d  Jonathan Acevedo Date of Birth: 08-21-1928 Medical Record #[id number]  History of Present Illness: Jonathan Acevedo is seen for follow up of his recent cardiac cath and severe AS.  He has a history of CAD with remote MI and prior stent to the LAD, HTN, CKD, AS and HLD. In April of 2014 a stress Myoview study showed extensive scar of the anterior apex and inferior wall. Ejection fraction is 44%. This actually represented an improvement from echocardiogram in 2012 at which time his ejection fraction was 35-40%. In Ocober 2014 he presented with a prolonged episode of chest pain. Echo showed no change in his moderate AS - medical management was continued.   He was admitted twice  in April 2015 with syncope that resulted in a fractured right ankle and acute systolic HF. Cardiac cath showed no obstructive disease and mild to moderate AS. He had a loop recorder placed. Syncope felt to be related to Flomax. Unable to take ACEi due to CKD and hypotension.  In July 2015 he was readmitted with acute on chronic CHF. Diuresed with good result. No recurrent syncope.   Echo November 2016 showed moderate AS with mean gradient 17 mm Hg. EF 30%. Repeat Echo in January 2018 showed stable EF of 30-35% with increase mean AV gradient to 36 mm Hg. His most recent Echo showed progression of AS with mean gradient of 40 mm Hg, worsening EF 25-30%, and worsening pulmonary Htn.   Last loop recorder evaluation on 07/06/16 showed normal device function and no Afib.   He underwent recent cardiac cath showing nonobstructive CAD, severe AS and moderate pulmonary venous HTN. Recommended TAVR evaluation. He was seen in the valvular clinic in April and TAVR was discussed at length.  On follow up today he is doing OK. He states recently worked out in his yard in the 93 degree heat. This wiped him out. Developed a cough and SOB. Seen by primary care and started on amoxicillin for bronchitis. Given inhaler but this made him feel jittery. Cough  has resolved. breathing is OK. Weight is down and edema is stable. No chest pain. Given lorazepam for nerves and tramadol for muscle pain but this affected his balance. He does have some  SOB with exertion.   He is still undecided about TAVR. Really wants to know what it will cost him. Concerned about potential complications and talks about all the people he knows who have had complications from some procedure.   Current Outpatient Medications  Medication Sig Dispense Refill  . Acetaminophen (TYLENOL ARTHRITIS PAIN PO) Take by mouth as directed.    Marland Kitchen amoxicillin (AMOXIL) 875 MG tablet Take 875 mg by mouth 2 (two) times daily.    Marland Kitchen aspirin 81 MG chewable tablet Chew 81 mg by mouth daily.     Marland Kitchen atorvastatin (LIPITOR) 40 MG tablet TAKE 1 TABLET BY MOUTH  DAILY 90 tablet 1  . calcium-vitamin D (OSCAL WITH D) 500-200 MG-UNIT tablet Take 1 tablet by mouth daily with breakfast.    . clonazePAM (KLONOPIN) 0.5 MG tablet Take 0.5 mg by mouth at bedtime.    . furosemide (LASIX) 40 MG tablet TAKE 1 TABLET BY MOUTH IN  THE MORNING AND 1/2 TABLET  IN THE EVENING MAY TAKE ONE MORE TABLET FOR WEIGHT GAIN OR INCREASE SWELLING 180 tablet 2  . magnesium oxide (MAG-OX) 400 MG tablet Take 400-800 mg by mouth at bedtime as needed (leg cramps).   12  . nitroGLYCERIN (NITROSTAT) 0.4 MG SL  tablet Place 1 tablet (0.4 mg total) under the tongue every 5 (five) minutes as needed for chest pain. 75 tablet 3  . potassium chloride SA (K-DUR,KLOR-CON) 20 MEQ tablet TAKE 1 TABLET BY MOUTH  DAILY 90 tablet 1  . vitamin B-12 (CYANOCOBALAMIN) 1000 MCG tablet Take 1,000 mcg by mouth every morning.     No current facility-administered medications for this visit.     Allergies  Allergen Reactions  . Ibandronic Acid Other (See Comments)    Muscle Aches-pt denies  . Risedronate Sodium Other (See Comments)    Muscle aches  . Flomax [Tamsulosin Hcl] Other (See Comments)    Syncope     Past Medical History:  Diagnosis Date  .  Afib ([place])   . Aortic stenosis   . Arthritis   . Cataract   . CHF (congestive heart failure) ([place])   . Coronary artery disease    w remote anterior myocardial infarction (1998)  . GERD (gastroesophageal reflux disease)   . Hypercalcemia 12/26/2011  . Hypercholesteremia   . Hypertension   . Inguinal hernia    right  . Kidney stones   . Myocardial infarct, old    3 times   . 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 Rectal polyp   . Renal insufficiency   . Syncope 05/18/2013  . Wears glasses     Past Surgical History:  Procedure Laterality Date  . CARDIAC CATHETERIZATION  08/18/2008   [place] 20, LAD stent 20 ISR, CFX 10, RCA 20, EF 45  . CATARACT EXTRACTION    . COLONOSCOPY W/ BIOPSIES AND POLYPECTOMY    . CORONARY STENT PLACEMENT  07/19/1996   Proximal LAD  . ELECTROPHYSIOLOGY STUDY  05-19-13   EPS for syncope without inducible arrhythmias by Dr Lovena Le  . ELECTROPHYSIOLOGY STUDY N/A 05/19/2013   Procedure: ELECTROPHYSIOLOGY STUDY;  Surgeon: Evans Lance, MD;  Location: [facility] CATH LAB;  Service: Cardiovascular;  Laterality: N/A;  . FRACTURE SURGERY     right hip  . INGUINAL HERNIA REPAIR     left  . INGUINAL HERNIA REPAIR Right 02/09/2015   Procedure: RIGHT INGUINAL HERNIA REPAIR W/ MESH;  Surgeon: Jackolyn Confer, MD;  Location: [facility];  Service: General;  Laterality: Right;  inguinal hernia  . INSERTION OF MESH Right 02/09/2015   Procedure: INSERTION OF MESH to right inguinal hernia;  Surgeon: Jackolyn Confer, MD;  Location: [place];  Service: General;  Laterality: Right;  . LEFT HEART CATHETERIZATION WITH CORONARY ANGIOGRAM N/A 05/19/2013   Procedure: LEFT HEART CATHETERIZATION WITH CORONARY ANGIOGRAM;  Surgeon: Isola Mehlman M Martinique, MD;  Location: [facility] CATH LAB;  Service: Cardiovascular;  Laterality: N/A;  . LOOP RECORDER IMPLANT  05-19-13   MDT LinQ implanted by Dr Lovena Le for syncope following negative EPS  . ORIF FEMUR FRACTURE     right  . parathyroidectomy    . RIGHT/LEFT HEART CATH AND  CORONARY ANGIOGRAPHY N/A 04/06/2017   Procedure: RIGHT/LEFT HEART CATH AND CORONARY ANGIOGRAPHY;  Surgeon: Martinique, Slayter Moorhouse M, MD;  Location: [facility] CV LAB;  Service: Cardiovascular;  Laterality: N/A;  . UMBILICAL HERNIA REPAIR      Social History   Tobacco Use  Smoking Status Former Smoker  . Packs/day: 1.00  . Years: 45.00  . Pack years: 45.00  . Types: Cigarettes  . Last attempt to quit: 07/19/1996  . Years since quitting: 21.0  Smokeless Tobacco Never Used    Social History   Substance and Sexual Activity  Alcohol Use No  History reviewed. No pertinent family history.  Review of Systems: The review of systems is per the HPI.  All other systems were reviewed and are negative.  Physical Exam: BP 130/70   Pulse (!) 56   Ht 5\' 10"  (1.778 m)   Wt 173 lb (78.5 kg)   SpO2 93%   BMI 24.82 kg/m  GENERAL:  elderly WM in NAD HEENT:  PERRL, EOMI, sclera are clear. Oropharynx is clear. NECK:  No jugular venous distention, carotid upstroke brisk and symmetric, no bruits, no thyromegaly or adenopathy LUNGS:  Clear to auscultation bilaterally CHEST:  Unremarkable HEART:  RRR,  PMI not displaced or sustained,S1 and S2 within normal limits, no S3, no S4: no clicks, no rubs, There is a harsh gr 3/6 systolic murmur RUSB radiating across the precordium.  ABD:  Soft, nontender. BS +, no masses or bruits. No hepatomegaly, no splenomegaly EXT:  2 + pulses throughout, tr-1+ edema, no cyanosis no clubbing SKIN:  Warm and dry.  No rashes NEURO:  Alert and oriented x 3. Cranial nerves II through XII intact. PSYCH:  Cognitively intact      Wt Readings from Last 3 Encounters:  08/07/17 173 lb (78.5 kg)  07/30/17 172 lb (78 kg)  05/11/17 181 lb (82.1 kg)     LABORATORY DATA:  Lab Results  Component Value Date   WBC 6.6 07/30/2017   HGB 11.7 (L) 07/30/2017   HCT 36.5 (L) 07/30/2017   PLT 165 07/30/2017   GLUCOSE 104 (H) 07/30/2017   CHOL 128 03/30/2017   TRIG 87 03/30/2017     HDL 69 03/30/2017   LDLCALC 42 03/30/2017   ALT 12 (L) 07/30/2017   AST 16 07/30/2017   NA 141 07/30/2017   K 3.7 07/30/2017   CL 104 07/30/2017   CREATININE 1.55 (H) 07/30/2017   BUN 28 (H) 07/30/2017   CO2 26 07/30/2017   TSH 1.453 Test methodology is 3rd generation TSH 08/17/2008   INR 1.2 03/30/2017   HGBA1C 6.2 (H) 11/16/2012      Lab Results  Component Value Date   CHOL 128 03/30/2017   HDL 69 03/30/2017   LDLCALC 42 03/30/2017   TRIG 87 03/30/2017   CHOLHDL 2.3 01/25/2016   Labs dated 07/18/16: creatinine 1.52. Other chemistries and CBC normal.  Echo 02/17/16: Study Conclusions  - Left ventricle: The cavity size was moderately dilated. Wall   thickness was normal. Systolic function was moderately to   severely reduced. The estimated ejection fraction was in the   range of 30% to 35%. Akinesis of the mid-apicalanteroseptal   myocardium. Doppler parameters are consistent with abnormal left   ventricular relaxation (grade 1 diastolic dysfunction). - Aortic valve: Moderately to severely calcified annulus. Mildly   thickened, moderately calcified leaflets. There was moderate to   severe stenosis. There was mild regurgitation. Mean gradient (S):   36 mm Hg. Peak gradient (S): 60 mm Hg. Valve area (VTI): 1.23   cm^2. Valve area (Vmax): 1.28 cm^2. Valve area (Vmean): 1.31   cm^2. - Mitral valve: Mean gradient (D): 2 mm Hg. Valve area by   continuity equation (using LVOT flow): 3.11 cm^2. - Left atrium: The atrium was severely dilated. - Right atrium: The atrium was mildly dilated. - Pulmonary arteries: Systolic pressure was moderately increased.   PA peak pressure: 47 mm Hg (S).  Echo 03/15/17: Study Conclusions  - Left ventricle: The cavity size was mildly dilated. There was   moderate concentric hypertrophy. Systolic function was  severely   reduced. The estimated ejection fraction was in the range of 25%   to 30%. Diffuse hypokinesis with akinesis of the  mid-apical   anteroseptal myocardium. Doppler parameters are consistent with a   reversible restrictive pattern, indicative of decreased left   ventricular diastolic compliance and/or increased left atrial   pressure (grade 3 diastolic dysfunction). Doppler parameters are   consistent with high ventricular filling pressure. - Aortic valve: Valve mobility was restricted. There was severe   stenosis. There was mild regurgitation. - Aorta: Ascending aortic diameter: 37 mm (S). - Mitral valve: Severely calcified annulus, mostly posterior.   Mobility of the posterior leaflet was restricted. Transvalvular   velocity was within the normal range. There was no evidence for   stenosis. There was trivial regurgitation. - Left atrium: The atrium was severely dilated. - Right ventricle: The cavity size was mildly dilated. Wall   thickness was normal. Systolic function was normal. - Right atrium: The atrium was mildly dilated. - Tricuspid valve: There was mild regurgitation. - Pulmonary arteries: Systolic pressure was severely increased. PA   peak pressure: 82 mm Hg (S).  ------------------------------------------------------------------- Study data:   Study status:  Routine.  Procedure:  The patient reported no pain pre or post test. Transthoracic echocardiography for left ventricular function evaluation, for right ventricular function evaluation, and for assessment of valvular function. Image quality was adequate.  Study completion:  There were no complications.          Echocardiography.  M-mode, complete 2D, spectral Doppler, and color Doppler.  Birthdate:  Patient birthdate: 13-Jun-1928.  Age:  Patient is 84 yr old.  Sex:  Gender: male.    BMI: 26.8 kg/m^2.  Blood pressure:     122/72  Patient status:  Outpatient.  Study date:  Study date: 03/15/2017. Study time: 11:53 AM.  Location:  [facility]  3  -------------------------------------------------------------------  ------------------------------------------------------------------- Left ventricle:  The cavity size was mildly dilated. There was moderate concentric hypertrophy. Systolic function was severely reduced. The estimated ejection fraction was in the range of 25% to 30%.  Regional wall motion abnormalities:  Diffuse hypokinesis with akinesis of the mid-apical anteroseptal myocardium. Doppler parameters are consistent with a reversible restrictive pattern, indicative of decreased left ventricular diastolic compliance and/or increased left atrial pressure (grade 3 diastolic dysfunction). Doppler parameters are consistent with high ventricular filling pressure.  ------------------------------------------------------------------- Aortic valve:   Trileaflet; severely thickened, severely calcified leaflets. Valve mobility was restricted.  Doppler:   There was severe stenosis.   There was mild regurgitation.    VTI ratio of LVOT to aortic valve: 0.25. Valve area (VTI): 0.85 cm^2. Indexed valve area (VTI): 0.41 cm^2/m^2. Peak velocity ratio of LVOT to aortic valve: 0.25. Valve area (Vmax): 0.86 cm^2. Indexed valve area (Vmax): 0.42 cm^2/m^2. Mean velocity ratio of LVOT to aortic valve: 0.25. Valve area (Vmean): 0.87 cm^2. Indexed valve area (Vmean): 0.42 cm^2/m^2.    Mean gradient (S): 40 mm Hg. Peak gradient (S): 62 mm Hg.  ------------------------------------------------------------------- Aorta:  Aortic root: The aortic root was normal in size. Ascending aorta: The ascending aorta was mildly dilated.  ------------------------------------------------------------------- Mitral valve:  Severely calcified annulus, mostly posterior. Mobility of the posterior leaflet was restricted.  Doppler: Transvalvular velocity was within the normal range. There was no evidence for stenosis. There was trivial regurgitation.     Peak gradient (D): 7 mm Hg.  ------------------------------------------------------------------- Left atrium:  The atrium was severely dilated.  ------------------------------------------------------------------- Right ventricle:  The cavity size was mildly  dilated. Wall thickness was normal. Systolic function was normal.  ------------------------------------------------------------------- Pulmonic valve:    Doppler:  Transvalvular velocity was within the normal range. There was no evidence for stenosis.  ------------------------------------------------------------------- Tricuspid valve:   Structurally normal valve.    Doppler: Transvalvular velocity was within the normal range. There was mild regurgitation.  ------------------------------------------------------------------- Pulmonary artery:   The main pulmonary artery was normal-sized. Systolic pressure was severely increased.  ------------------------------------------------------------------- Right atrium:  The atrium was mildly dilated.  ------------------------------------------------------------------- Pericardium:  There was no pericardial effusion.  ------------------------------------------------------------------- Systemic veins: Inferior vena cava: The vessel was dilated. The respirophasic diameter changes were blunted (< 50%), consistent with elevated central venous pressure.  ------------------------------------------------------------------- Measurements   Left ventricle                            Value          Reference  LV ID, ED, PLAX chordal           (H)     54.5  mm       43 - 52  LV ID, ES, PLAX chordal           (H)     42.7  mm       23 - 38  LV fx shortening, PLAX chordal    (L)     22    %        >=29  LV PW thickness, ED                       14.1  mm       ---------  IVS/LV PW ratio, ED                       0.97           <=1.3  Stroke volume, 2D                         78    ml        ---------  Stroke volume/bsa, 2D                     38    ml/m^2   ---------  LV e&', lateral                            6.58  cm/s     ---------  LV E/e&', lateral                          19.6           ---------  LV e&', medial                             5.37  cm/s     ---------  LV E/e&', medial                           24.02          ---------  LV e&', average                            5.98  cm/s     ---------  LV E/e&', average                          21.59          ---------    Ventricular septum                        Value          Reference  IVS thickness, ED                         13.7  mm       ---------    LVOT                                      Value          Reference  LVOT ID, S                                21    mm       ---------  LVOT area                                 3.46  cm^2     ---------  LVOT ID                                   21    mm       ---------  LVOT peak velocity, S                     97.7  cm/s     ---------  LVOT mean velocity, S                     76.2  cm/s     ---------  LVOT VTI, S                               22.5  cm       ---------  LVOT peak gradient, S                     4     mm Hg    ---------  Stroke volume (SV), LVOT DP               77.9  ml       ---------  Stroke index (SV/bsa), LVOT DP            37.8  ml/m^2   ---------    Aortic valve                              Value          Reference  Aortic valve peak velocity, S             392   cm/s     ---------  Aortic valve mean velocity, S             303   cm/s     ---------  Aortic valve VTI, S                       91.4  cm       ---------  Aortic mean gradient, S                   40    mm Hg    ---------  Aortic peak gradient, S                   62    mm Hg    ---------  VTI ratio, LVOT/AV                        0.25           ---------  Aortic valve area, VTI                    0.85  cm^2     ---------  Aortic valve area/bsa, VTI                0.41  cm^2/m^2  ---------  Velocity ratio, peak, LVOT/AV             0.25           ---------  Aortic valve area, peak velocity          0.86  cm^2     ---------  Aortic valve area/bsa, peak               0.42  cm^2/m^2 ---------  velocity  Velocity ratio, mean, LVOT/AV             0.25           ---------  Aortic valve area, mean velocity          0.87  cm^2     ---------  Aortic valve area/bsa, mean               0.42  cm^2/m^2 ---------  velocity    Aorta                                     Value          Reference  Aortic root ID, ED                        35    mm       ---------  Ascending aorta ID, A-P, S                37    mm       ---------    Left atrium                               Value          Reference  LA ID, A-P, ES                            59    mm       ---------  LA ID/bsa, A-P                    (H)     2.86  cm/m^2   <=2.2  LA volume, S                              144   ml       ---------  LA volume/bsa, S                          69.9  ml/m^2   ---------  LA volume, ES, 1-p A4C                    117   ml       ---------  LA volume/bsa, ES, 1-p A4C                56.8  ml/m^2   ---------  LA volume, ES, 1-p A2C                    173   ml       ---------  LA volume/bsa, ES, 1-p A2C                84    ml/m^2   ---------    Mitral valve                              Value          Reference  Mitral E-wave peak velocity               129   cm/s     ---------  Mitral A-wave peak velocity               61.4  cm/s     ---------  Mitral deceleration time                  155   ms       150 - 230  Mitral peak gradient, D                   7     mm Hg    ---------  Mitral E/A ratio, peak                    2.1            ---------    Pulmonary arteries                        Value          Reference  PA pressure, S, DP                (H)     82    mm Hg    <=30    Tricuspid valve                           Value          Reference  Tricuspid regurg peak velocity            408    cm/s     ---------  Tricuspid peak RV-RA gradient             67    mm Hg    ---------    Systemic veins  Value          Reference  Estimated CVP                             15    mm Hg    ---------    Right ventricle                           Value          Reference  RV pressure, S, DP                (H)     82    mm Hg    <=30  RV s&', lateral, S                         14.3  cm/s     ---------  Legend: (L)  and  (H)  mark values outside specified reference range.  ------------------------------------------------------------------- Prepared and Electronically Authenticated by  Jonathan Latch, MD 2019-02-07T15:50:49  RIGHT/LEFT HEART CATH AND CORONARY ANGIOGRAPHY  Conclusion     Ost LM lesion is 30% stenosed.  Prox LAD lesion is 20% stenosed.  LV end diastolic pressure is moderately elevated.  There is severe aortic valve stenosis.  Hemodynamic findings consistent with moderate pulmonary hypertension.   1. Nonobstructive CAD. Old stent in LAD is patent 2. Moderate pulmonary venous HTN. Mean PAP 40 mm Hg 3. Moderately elevated LV filling pressures.  4. Severe Aortic stenosis. AV mean gradient 32 mm Hg, AV area 0.98 cm squared. Gradient relatively lower due to low EF.   Plan: refer to valve clinic for consideration of TAVR.    Indications   Nonrheumatic aortic valve stenosis [I35.0 (ICD-10-CM)]  Procedural Details/Technique   Technical Details Indication: 84 yo WM with progressive aortic stenosis. EF 25-30% by Echo  Procedural Details: The right wrist was prepped, draped, and anesthetized with 1% lidocaine. Using the modified Seldinger technique a 6 Fr slender sheath was placed in the right radial artery and a 5 French sheath was placed in the right brachial vein. A Swan-Ganz catheter was used for the right heart catheterization. Standard protocol was followed for recording of right heart pressures and sampling of oxygen saturations.  Fick cardiac output was calculated. Standard Judkins catheters were used for selective coronary angiography and left ventriculography. There were no immediate procedural complications. The patient was transferred to the post catheterization recovery area for further monitoring.  Contrast: 50 cc   Estimated blood loss <50 mL.  During this procedure the patient was administered the following to achieve and maintain moderate conscious sedation: Versed 1 mg, Fentanyl 25 mcg, while the patient's heart rate, blood pressure, and oxygen saturation were continuously monitored. The period of conscious sedation was 36 minutes, of which I was present face-to-face 100% of this time.  Complications   Complications documented before study signed (04/06/2017 11:21 AM EST)    No complications were associated with this study.  Documented by Martinique, Demorris Choyce M, MD - 04/06/2017 11:18 AM EST    Coronary Findings   Diagnostic  Dominance: Right  Left Main  Ost LM lesion 30% stenosed  Ost LM lesion is 30% stenosed.  Left Anterior Descending  Prox LAD lesion 20% stenosed  Prox LAD lesion is 20% stenosed. The lesion was previously treated using a stent (unknown type) over 2 years ago.  Left Circumflex  Vessel was injected. Vessel is large. The vessel exhibits minimal luminal irregularities. The vessel is mildly calcified.  Right Coronary Artery  Vessel was injected. Vessel is large. There is mild diffuse disease throughout the vessel. The vessel is mildly calcified.  Intervention   No interventions have been documented.  Right Heart   Right Heart Pressures Hemodynamic findings consistent with moderate pulmonary hypertension.  Left Heart   Left Ventricle LV end diastolic pressure is moderately elevated.  Aortic Valve There is severe aortic valve stenosis. The aortic valve is calcified.  Coronary Diagrams   Diagnostic Diagram       Implants     No implant documentation for this case.  MERGE Images    Show images for CARDIAC CATHETERIZATION   Link to Procedure Log   Procedure Log    Hemo Data    Most Recent Value  Fick Cardiac Output 5.87 L/min  Fick Cardiac Output Index 2.92 (L/min)/BSA  Aortic Mean Gradient 31.5 mmHg  Aortic Peak Gradient 35 mmHg  Aortic Valve Area 0.98  Aortic Value Area Index 0.49 cm2/BSA  RA A Wave 11 mmHg  RA V Wave 8 mmHg  RA Mean 7 mmHg  RV Systolic Pressure 69 mmHg  RV Diastolic Pressure 1 mmHg  RV EDP 11 mmHg  PA Systolic Pressure 67 mmHg  PA Diastolic Pressure 27 mmHg  PA Mean 40 mmHg  PW A Wave 32 mmHg  PW V Wave 42 mmHg  PW Mean 30 mmHg  AO Systolic Pressure 177 mmHg  AO Diastolic Pressure 65 mmHg  AO Mean 87 mmHg  LV Systolic Pressure 939 mmHg  LV Diastolic Pressure 10 mmHg  LV EDP 31 mmHg  Arterial Occlusion Pressure Extended Systolic Pressure 030 mmHg  Arterial Occlusion Pressure Extended Diastolic Pressure 66 mmHg  Arterial Occlusion Pressure Extended Mean Pressure 87 mmHg  Left Ventricular Apex Extended Systolic Pressure 092 mmHg  Left Ventricular Apex Extended Diastolic Pressure 7 mmHg  Left Ventricular Apex Extended EDP Pressure 32 mmHg  QP/QS 1  TPVR Index 13.69 HRUI  TSVR Index 29.79 HRUI  PVR SVR Ratio 0.13  TPVR/TSVR Ratio 0.46     Assessment / Plan:  1. Chronic systolic HF - EF 33-00%-TMAU is down from prior baseline  related to progressive AS. He is doing well clinically but does have chronic dyspnea on exertion. Continue sodium restriction. Continue current diuretic dose.  Not a candidate for beta blockers due to bradycardia. Not a candidate for ACEi due to hypotension and CKD. Intolerant of nitrates due to hypotension. Medical therapy for his low EF is limited.  2. CAD -remote anterior MI and stent of LAD. Cardiac cath recently showed nonobstructive disease. No active symptoms.   3. Severe Aortic stenosis. Based on recent Echo gradient has increased, EF has decreased and he has more pulmonary HTN. We again  discussed the natural history of his disease and the concern that he is at high risk for progressive LV dysfunction, CHF, arrhythmia, and death. He is still undecided about TAVR. Is really concerned about the cost and is going to talk to [facility] care about this. He is planning to see his dentist next month to get any dental issues resolved. He is contrary by nature and may decide to do nothing. The decision is his.   4. CKD- stage 3.   5. Syncope. Most likely related to orthostasis on Flomax. Loop recorder without significant arrhythmia.   6. Situational depression/anxiety  7. Hyperlipidemia. On lipitor.

## 2017-08-07 ENCOUNTER — Ambulatory Visit: Payer: Medicare Other | Admitting: Cardiology

## 2017-08-07 ENCOUNTER — Encounter: Payer: Self-pay | Admitting: Cardiology

## 2017-08-07 VITALS — BP 130/70 | HR 56 | Ht 70.0 in | Wt 173.0 lb

## 2017-08-07 DIAGNOSIS — E7800 Pure hypercholesterolemia, unspecified: Secondary | ICD-10-CM

## 2017-08-07 DIAGNOSIS — I5022 Chronic systolic (congestive) heart failure: Secondary | ICD-10-CM | POA: Diagnosis not present

## 2017-08-07 DIAGNOSIS — I350 Nonrheumatic aortic (valve) stenosis: Secondary | ICD-10-CM | POA: Diagnosis not present

## 2017-08-07 DIAGNOSIS — I10 Essential (primary) hypertension: Secondary | ICD-10-CM | POA: Diagnosis not present

## 2017-08-07 NOTE — Patient Instructions (Addendum)
Continue your current therapy  Get your dental work taken care of.  I will see you in 4 months.

## 2017-09-01 ENCOUNTER — Other Ambulatory Visit: Payer: Self-pay | Admitting: Cardiology

## 2017-10-11 ENCOUNTER — Telehealth: Payer: Self-pay | Admitting: Cardiology

## 2017-10-11 NOTE — Telephone Encounter (Signed)
New Message:     Jonathan Acevedo with [facility] states the patient is showing symptoms Heart Failure

## 2017-10-11 NOTE — Telephone Encounter (Signed)
Spoke to patient he stated he has not been sleeping well the past 2 weeks.Stated he has been having lower back pain due to a injury in the service.Stated he has appointment with [facility] in [place] this Sat for a back evaluation.Stated he has no sob,no swelling,weight stable.Stated he has been eating hot dogs every day and a lot of junk food.Stated he will try and eat a healthy low salt diet.Advised to keep appointment as planned with Dr.Jordan and call sooner if needed.

## 2017-11-03 ENCOUNTER — Other Ambulatory Visit: Payer: Self-pay | Admitting: Cardiology

## 2017-11-23 ENCOUNTER — Encounter (HOSPITAL_COMMUNITY): Payer: Self-pay

## 2017-11-23 ENCOUNTER — Emergency Department (HOSPITAL_COMMUNITY)
Admission: EM | Admit: 2017-11-23 | Discharge: 2017-11-23 | Disposition: A | Discharge status: Home / Self Care | Payer: Medicare Other | Attending: Emergency Medicine | Admitting: Emergency Medicine

## 2017-11-23 ENCOUNTER — Other Ambulatory Visit: Payer: Self-pay

## 2017-11-23 DIAGNOSIS — Y93E8 Activity, other personal hygiene: Secondary | ICD-10-CM | POA: Diagnosis not present

## 2017-11-23 DIAGNOSIS — I10 Essential (primary) hypertension: Secondary | ICD-10-CM | POA: Insufficient documentation

## 2017-11-23 DIAGNOSIS — Y929 Unspecified place or not applicable: Secondary | ICD-10-CM | POA: Diagnosis not present

## 2017-11-23 DIAGNOSIS — Z7982 Long term (current) use of aspirin: Secondary | ICD-10-CM | POA: Insufficient documentation

## 2017-11-23 DIAGNOSIS — Z87891 Personal history of nicotine dependence: Secondary | ICD-10-CM | POA: Insufficient documentation

## 2017-11-23 DIAGNOSIS — I252 Old myocardial infarction: Secondary | ICD-10-CM | POA: Insufficient documentation

## 2017-11-23 DIAGNOSIS — I509 Heart failure, unspecified: Secondary | ICD-10-CM | POA: Diagnosis not present

## 2017-11-23 DIAGNOSIS — S91302A Unspecified open wound, left foot, initial encounter: Secondary | ICD-10-CM | POA: Insufficient documentation

## 2017-11-23 DIAGNOSIS — X58XXXA Exposure to other specified factors, initial encounter: Secondary | ICD-10-CM | POA: Insufficient documentation

## 2017-11-23 DIAGNOSIS — Y999 Unspecified external cause status: Secondary | ICD-10-CM | POA: Diagnosis not present

## 2017-11-23 DIAGNOSIS — S99922A Unspecified injury of left foot, initial encounter: Secondary | ICD-10-CM

## 2017-11-23 DIAGNOSIS — Z79899 Other long term (current) drug therapy: Secondary | ICD-10-CM | POA: Insufficient documentation

## 2017-11-23 NOTE — ED Provider Notes (Signed)
[facility] DEPT Provider Note   CSN: [id number] Arrival date & time: 11/23/17  0137     History   Chief Complaint Chief Complaint  Patient presents with  . Extremity Laceration    HPI Jonathan Acevedo is a 84 y.o. male presenting for evaluation of left foot bleeding.  Patient states he was at home cutting his toenails when he noticed a scab on the dorsal aspect of his left foot.  He picked the scab, and had acute onset of bleeding from his left foot.  He denies pain, trauma, or injury.  He applied a dressing, and called 911.  Patient states he takes aspirin daily, is not on any other blood thinners.  He denies known injury to the area.  He is concerned this was an arterial bleed.  He denies feeling dizzy or weak.  No lesions or bleeding elsewhere.  HPI  Past Medical History:  Diagnosis Date  . Afib ([place])   . Aortic stenosis   . Arthritis   . Cataract   . CHF (congestive heart failure) ([place])   . Coronary artery disease    w remote anterior myocardial infarction (1998)  . GERD (gastroesophageal reflux disease)   . Hypercalcemia 12/26/2011  . Hypercholesteremia   . Hypertension   . Inguinal hernia    right  . Kidney stones   . Myocardial infarct, old    3 times   . 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 Rectal polyp   . Renal insufficiency   . Syncope 05/18/2013  . Wears glasses     Patient Active Problem List   Diagnosis Date Noted  . Nuclear sclerotic cataract of right eye 11/07/2016  . Right inguinal hernia 01/06/2015  . Chronic lower back pain 03/04/2014  . History of chronic back pain 10/05/2013  . Acute on chronic diastolic CHF (congestive heart failure), NYHA class 3 ([place]) 08/12/2013  . Chest pain 08/12/2013  . Acute on chronic systolic congestive heart failure ([place]) 05/25/2013  . Acute on chronic combined systolic and diastolic CHF (congestive heart failure) ([place]) 05/18/2013  . Syncope 05/18/2013  . Varicose veins of lower  extremities with other complications 63/33/5456  . Chronic venous insufficiency 07/29/2012  . Edema 07/18/2012  . Venous (peripheral) insufficiency 07/18/2012  . Iron deficiency anemia 03/29/2012  . Postoperative hypothyroidism 03/25/2012  . Tobacco abuse 01/03/2012  . Essential hypertension 01/03/2012  . 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 Osteoporosis 07/21/2011  . Hyperparathyroidism ([place]) 07/21/2011  . Chronic systolic CHF (congestive heart failure) ([place]) 07/03/2011  . Primary hyperparathyroidism ([place]) 07/03/2011  . Aortic valve disorder 06/01/2011  . Hypertrophy of prostate without urinary obstruction and other lower urinary tract symptoms (LUTS) 06/01/2011  . Ischemic cardiomyopathy 11/29/2010  . Myocardial infarction ([place])   . Coronary artery disease   . Hypertension   . Chronic renal insufficiency, stage III (moderate) ([facility])   . Aortic stenosis   . Rectal polyp   . Hypercholesteremia   . Impotence 03/25/2010  . Vitamin D deficiency 03/25/2010  . Hyperlipemia 08/06/2007  . Sensory hearing loss, bilateral 06/19/2007  . Chronic renal insufficiency 11/12/2006  . Hypercalcemia 12/11/2005    Past Surgical History:  Procedure Laterality Date  . CARDIAC CATHETERIZATION  08/18/2008   [place] 20, LAD stent 20 ISR, CFX 10, RCA 20, EF 45  . CATARACT EXTRACTION    . COLONOSCOPY W/ BIOPSIES AND POLYPECTOMY    . CORONARY STENT PLACEMENT  07/19/1996   Proximal LAD  . ELECTROPHYSIOLOGY STUDY  05-19-13  EPS for syncope without inducible arrhythmias by Dr Lovena Le  . ELECTROPHYSIOLOGY STUDY N/A 05/19/2013   Procedure: ELECTROPHYSIOLOGY STUDY;  Surgeon: Evans Lance, MD;  Location[facility] LAB;  Service: Cardiovascular;  Laterality: N/A;  . FRACTURE SURGERY     right hip  . INGUINAL HERNIA REPAIR     left  . INGUINAL HERNIA REPAIR Right 02/09/2015   Procedure: RIGHT INGUINAL HERNIA REPAIR W/ MESH;  Surgeon: Jackolyn Confer, MD;  Location: [facility];  Service: General;  Laterality: Right;   inguinal hernia  . INSERTION OF MESH Right 02/09/2015   Procedure: INSERTION OF MESH to right inguinal hernia;  Surgeon: Jackolyn Confer, MD;  Location: [facility];  Service: General;  Laterality: Right;  . LEFT HEART CATHETERIZATION WITH CORONARY ANGIOGRAM N/A 05/19/2013   Procedure: LEFT HEART CATHETERIZATION WITH CORONARY ANGIOGRAM;  Surgeon: Peter M Martinique, MD;  Location: [facility] CATH LAB;  Service: Cardiovascular;  Laterality: N/A;  . LOOP RECORDER IMPLANT  05-19-13   MDT LinQ implanted by Dr Lovena Le for syncope following negative EPS  . ORIF FEMUR FRACTURE     right  . parathyroidectomy    . RIGHT/LEFT HEART CATH AND CORONARY ANGIOGRAPHY N/A 04/06/2017   Procedure: RIGHT/LEFT HEART CATH AND CORONARY ANGIOGRAPHY;  Surgeon: Martinique, Peter M, MD;  Location: [facility] CV LAB;  Service: Cardiovascular;  Laterality: N/A;  . UMBILICAL HERNIA REPAIR          Home Medications    Prior to Admission medications   Medication Sig Start Date End Date Taking? Authorizing Provider  Acetaminophen (TYLENOL ARTHRITIS PAIN PO) Take by mouth as directed.    [provider]  amoxicillin (AMOXIL) 875 MG tablet Take 875 mg by mouth 2 (two) times daily. 07/30/17   [provider]  aspirin 81 MG chewable tablet Chew 81 mg by mouth daily.     [provider]  atorvastatin (LIPITOR) 40 MG tablet TAKE 1 TABLET BY MOUTH  DAILY 11/05/17   Martinique, Peter M, MD  calcium-vitamin D (OSCAL WITH D) 500-200 MG-UNIT tablet Take 1 tablet by mouth daily with breakfast.    [provider]  clonazePAM (KLONOPIN) 0.5 MG tablet Take 0.5 mg by mouth at bedtime.    [provider]  furosemide (LASIX) 40 MG tablet TAKE 1 TABLET BY MOUTH IN  THE MORNING AND 1/2 TABLET  IN THE EVENING MAY TAKE ONE MORE TABLET FOR WEIGHT GAIN OR INCREASE SWELLING 09/03/17   Martinique, Peter M, MD  magnesium oxide (MAG-OX) 400 MG tablet Take 400-800 mg by mouth at bedtime as needed (leg cramps).  07/22/17   [provider]  nitroGLYCERIN (NITROSTAT) 0.4 MG SL tablet Place 1 tablet (0.4 mg total) under the tongue every 5 (five) minutes as needed for chest pain. 02/23/17   Martinique, Peter M, MD  potassium chloride SA (K-DUR,KLOR-CON) 20 MEQ tablet TAKE 1 TABLET BY MOUTH  DAILY 06/27/17   Martinique, Peter M, MD  potassium chloride SA (K-DUR,KLOR-CON) 20 MEQ tablet TAKE 1 TABLET BY MOUTH  DAILY 11/05/17   Martinique, Peter M, MD  vitamin B-12 (CYANOCOBALAMIN) 1000 MCG tablet Take 1,000 mcg by mouth every morning.    [provider]    Family History No family history on file.  Social History Social History   Tobacco Use  . Smoking status: Former Smoker    Packs/day: 1.00    Years: 45.00    Pack years: 45.00    Types: Cigarettes    Last attempt to quit:  07/19/1996    Years since quitting: 21.3  . Smokeless tobacco: Never Used  Substance Use Topics  . Alcohol use: No  . Drug use: No     Allergies   Ibandronic acid; Risedronate sodium; and Flomax [tamsulosin hcl]   Review of Systems Review of Systems  Skin: Positive for wound.  Hematological: Does not bruise/bleed easily.     Physical Exam Updated Vital Signs BP 121/77   Pulse (!) 57   Temp 98.1 F (36.7 C) (Oral)   Resp 18   SpO2 95%   Physical Exam  Constitutional: He is oriented to person, place, and time. He appears well-developed and well-nourished. No distress.  Patient appears in no distress  HENT:  Head: Normocephalic and atraumatic.  Eyes: EOM are normal.  Neck: Normal range of motion.  Pulmonary/Chest: Effort normal.  Abdominal: He exhibits no distension.  Musculoskeletal: Normal range of motion. He exhibits no tenderness or deformity.  Neurological: He is alert and oriented to person, place, and time.  Skin: Skin is warm. Capillary refill takes less than 2 seconds. No rash noted. No pallor.  No bleeding from the left foot.  All scab overlying a varicose vein on the dorsal aspect of the left foot.  This is likely the source  of the bleed.    Psychiatric: He has a normal mood and affect.  Nursing note and vitals reviewed.    ED Treatments / Results  Labs (all labs ordered are listed, but only abnormal results are displayed) Labs Reviewed - No data to display  EKG None  Radiology No results found.  Procedures Procedures (including critical care time)  Medications Ordered in ED Medications - No data to display   Initial Impression / Assessment and Plan / ED Course  I have reviewed the triage vital signs and the nursing notes.  Pertinent labs & imaging results that were available during my care of the patient were reviewed by me and considered in my medical decision making (see chart for details).     Pt presenting for evaluation of left foot bleeding.  Physical exam reassuring, no active bleeding noted.  He is neurovascularly intact.  Patient has a small scab overlying a varicose vein, this was likely the source of the bleeding. Nothing to suture at this time.  Discussed wound care with patient.  Discussed that if area starts to bleed, he should hold direct pressure and reassess.  Follow-up with PCP if bleeding recurs.  Patient without pallor, tachycardia, or hypotension, doubt life-threatening blood loss.  Discussed findings with patient and caregiver.  At this time, patient appears safe for discharge.  Return precautions given.  Patient states he understands and agrees to plan.   Final Clinical Impressions(s) / ED Diagnoses   Final diagnoses:  Injury of left foot, initial encounter    ED Discharge Orders    None       Franchot Heidelberg, PA-C 11/23/17 [place], Ankit, MD 11/23/17 [phone]

## 2017-11-23 NOTE — ED Triage Notes (Signed)
Per ems: pt coming from home c/o left foot laceration. Not on blood thinners. Bleeding controlled now. Significant blood loss on scene.

## 2017-11-23 NOTE — ED Notes (Signed)
Pt did not want vitals taken again before leaving.

## 2017-11-23 NOTE — ED Notes (Signed)
Bed: WA21 Expected date:  Expected time:  Means of arrival:  Comments: 1 M foot lac

## 2017-11-23 NOTE — Discharge Instructions (Signed)
Keep the bandage on. Do not pick at any scabs. Continue taking home medications as prescribed.  You have no restrictions- you may walk and do daily activities that you need to.  If your foot starts bleeding again, hold direct pressure for 10-20 minutes. If bleeding does not stop with constant direct pressure, return to the ER. If bleeding recurs, follow up with your primary care doctor.  Return to the ER with any new, worsening, or concerning symptoms.

## 2017-11-30 NOTE — Progress Notes (Signed)
d  Jonathan Acevedo Date of Birth: 1928-09-10 Medical Record #[id number]  History of Present Illness: Jonathan Acevedo is seen for follow up of his recent cardiac cath and severe AS.  He has a history of CAD with remote MI and prior stent to the LAD, HTN, CKD, AS and HLD. In April of 2014 a stress Myoview study showed extensive scar of the anterior apex and inferior wall. Ejection fraction is 44%. This actually represented an improvement from echocardiogram in 2012 at which time his ejection fraction was 35-40%. In Ocober 2014 he presented with a prolonged episode of chest pain. Echo showed no change in his moderate AS - medical management was continued.   He was admitted twice  in April 2015 with syncope that resulted in a fractured right ankle and acute systolic HF. Cardiac cath showed no obstructive disease and mild to moderate AS. He had a loop recorder placed. Syncope felt to be related to Flomax. Unable to take ACEi due to CKD and hypotension.  In July 2015 he was readmitted with acute on chronic CHF. Diuresed with good result. No recurrent syncope.   Echo November 2016 showed moderate AS with mean gradient 17 mm Hg. EF 30%. Repeat Echo in January 2018 showed stable EF of 30-35% with increase mean AV gradient to 36 mm Hg. His most recent Echo showed progression of AS with mean gradient of 40 mm Hg, worsening EF 25-30%, and worsening pulmonary Htn.   Last loop recorder evaluation on 07/06/16 showed normal device function and no Afib.   He underwent cardiac cath 04/06/17 showing nonobstructive CAD, severe AS and moderate pulmonary venous HTN. Recommended TAVR evaluation. He was seen in the valvular clinic in April and TAVR was discussed at length. He was referred to the Valvular heart clinic and considered for TAVR but patient did not want to proceed.   On follow up today he reports symptoms of SOB and dizziness when he walks up one flight of stairs. His edema is stable and improved with support hose. No  chest pain. No syncope. Feels cold and sleepy.   Current Outpatient Medications  Medication Sig Dispense Refill  . Acetaminophen (TYLENOL ARTHRITIS PAIN PO) Take by mouth as directed.    Marland Kitchen aspirin 81 MG chewable tablet Chew 81 mg by mouth daily.     Marland Kitchen atorvastatin (LIPITOR) 40 MG tablet TAKE 1 TABLET BY MOUTH  DAILY 90 tablet 1  . calcium-vitamin D (OSCAL WITH D) 500-200 MG-UNIT tablet Take 1 tablet by mouth daily with breakfast.    . furosemide (LASIX) 40 MG tablet TAKE 1 TABLET BY MOUTH IN  THE MORNING AND 1/2 TABLET  IN THE EVENING MAY TAKE ONE MORE TABLET FOR WEIGHT GAIN OR INCREASE SWELLING 180 tablet 2  . magnesium oxide (MAG-OX) 400 MG tablet Take 400-800 mg by mouth at bedtime as needed (leg cramps).   12  . nitroGLYCERIN (NITROSTAT) 0.4 MG SL tablet Place 1 tablet (0.4 mg total) under the tongue every 5 (five) minutes as needed for chest pain. 75 tablet 3  . potassium chloride SA (K-DUR,KLOR-CON) 20 MEQ tablet TAKE 1 TABLET BY MOUTH  DAILY 90 tablet 1  . vitamin B-12 (CYANOCOBALAMIN) 1000 MCG tablet Take 1,000 mcg by mouth every morning.     No current facility-administered medications for this visit.     Allergies  Allergen Reactions  . Ibandronic Acid Other (See Comments)    Muscle Aches-pt denies  . Risedronate Sodium Other (See Comments)    Muscle  aches  . Flomax [Tamsulosin Hcl] Other (See Comments)    Syncope     Past Medical History:  Diagnosis Date  . Afib ([place])   . Aortic stenosis   . Arthritis   . Cataract   . CHF (congestive heart failure) ([place])   . Coronary artery disease    w remote anterior myocardial infarction (1998)  . GERD (gastroesophageal reflux disease)   . Hypercalcemia 12/26/2011  . Hypercholesteremia   . Hypertension   . Inguinal hernia    right  . Kidney stones   . Myocardial infarct, old    3 times   . 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 Rectal polyp   . Renal insufficiency   . Syncope 05/18/2013  . Wears glasses     Past Surgical  History:  Procedure Laterality Date  . CARDIAC CATHETERIZATION  08/18/2008   [place] 20, LAD stent 20 ISR, CFX 10, RCA 20, EF 45  . CATARACT EXTRACTION    . COLONOSCOPY W/ BIOPSIES AND POLYPECTOMY    . CORONARY STENT PLACEMENT  07/19/1996   Proximal LAD  . ELECTROPHYSIOLOGY STUDY  05-19-13   EPS for syncope without inducible arrhythmias by Dr Lovena Le  . ELECTROPHYSIOLOGY STUDY N/A 05/19/2013   Procedure: ELECTROPHYSIOLOGY STUDY;  Surgeon: Evans Lance, MD;  Location: [facility] CATH LAB;  Service: Cardiovascular;  Laterality: N/A;  . FRACTURE SURGERY     right hip  . INGUINAL HERNIA REPAIR     left  . INGUINAL HERNIA REPAIR Right 02/09/2015   Procedure: RIGHT INGUINAL HERNIA REPAIR W/ MESH;  Surgeon: Jackolyn Confer, MD;  Location: [place];  Service: General;  Laterality: Right;  inguinal hernia  . INSERTION OF MESH Right 02/09/2015   Procedure: INSERTION OF MESH to right inguinal hernia;  Surgeon: Jackolyn Confer, MD;  Location: [place];  Service: General;  Laterality: Right;  . LEFT HEART CATHETERIZATION WITH CORONARY ANGIOGRAM N/A 05/19/2013   Procedure: LEFT HEART CATHETERIZATION WITH CORONARY ANGIOGRAM;  Surgeon: Shaily Librizzi M Martinique, MD;  Location: [facility] CATH LAB;  Service: Cardiovascular;  Laterality: N/A;  . LOOP RECORDER IMPLANT  05-19-13   MDT LinQ implanted by Dr Lovena Le for syncope following negative EPS  . ORIF FEMUR FRACTURE     right  . parathyroidectomy    . RIGHT/LEFT HEART CATH AND CORONARY ANGIOGRAPHY N/A 04/06/2017   Procedure: RIGHT/LEFT HEART CATH AND CORONARY ANGIOGRAPHY;  Surgeon: Martinique, Exander Shaul M, MD;  Location: [place] CV LAB;  Service: Cardiovascular;  Laterality: N/A;  . UMBILICAL HERNIA REPAIR      Social History   Tobacco Use  Smoking Status Former Smoker  . Packs/day: 1.00  . Years: 45.00  . Pack years: 45.00  . Types: Cigarettes  . Last attempt to quit: 07/19/1996  . Years since quitting: 21.4  Smokeless Tobacco Never Used    Social History   Substance and Sexual  Activity  Alcohol Use No    History reviewed. No pertinent family history.  Review of Systems: The review of systems is per the HPI.  All other systems were reviewed and are negative.  Physical Exam: BP 129/74   Pulse (!) 58   Ht 5\' 10"  (1.778 m)   Wt 182 lb 9.6 oz (82.8 kg)   BMI 26.20 kg/m  GENERAL:  Elderly WM in NAD. Walks with a cane HEENT:  PERRL, EOMI, sclera are clear. Oropharynx is clear. NECK:  No jugular venous distention, carotid upstroke brisk and symmetric, no bruits, no thyromegaly or adenopathy LUNGS:  Clear  to auscultation bilaterally CHEST:  Unremarkable HEART:  RRR,  PMI not displaced or sustained,harsh gr 3/6 systolic murmur RUSB. no S3, no S4: no clicks, no rubs, no murmurs ABD:  Soft, nontender. BS +, no masses or bruits. No hepatomegaly, no splenomegaly EXT:  2 + pulses throughout, no edema, no cyanosis no clubbing SKIN:  Warm and dry.  No rashes NEURO:  Alert and oriented x 3. Cranial nerves II through XII intact. PSYCH:  Cognitively intact   Wt Readings from Last 3 Encounters:  12/10/17 182 lb 9.6 oz (82.8 kg)  08/07/17 173 lb (78.5 kg)  07/30/17 172 lb (78 kg)     LABORATORY DATA:  Lab Results  Component Value Date   WBC 6.6 07/30/2017   HGB 11.7 (L) 07/30/2017   HCT 36.5 (L) 07/30/2017   PLT 165 07/30/2017   GLUCOSE 104 (H) 07/30/2017   CHOL 128 03/30/2017   TRIG 87 03/30/2017   HDL 69 03/30/2017   LDLCALC 42 03/30/2017   ALT 12 (L) 07/30/2017   AST 16 07/30/2017   NA 141 07/30/2017   K 3.7 07/30/2017   CL 104 07/30/2017   CREATININE 1.55 (H) 07/30/2017   BUN 28 (H) 07/30/2017   CO2 26 07/30/2017   TSH 1.453 Test methodology is 3rd generation TSH 08/17/2008   INR 1.2 03/30/2017   HGBA1C 6.2 (H) 11/16/2012    Labs dated 07/18/16: creatinine 1.52. Other chemistries and CBC normal.  Echo 02/17/16: Study Conclusions  - Left ventricle: The cavity size was moderately dilated. Wall   thickness was normal. Systolic function was  moderately to   severely reduced. The estimated ejection fraction was in the   range of 30% to 35%. Akinesis of the mid-apicalanteroseptal   myocardium. Doppler parameters are consistent with abnormal left   ventricular relaxation (grade 1 diastolic dysfunction). - Aortic valve: Moderately to severely calcified annulus. Mildly   thickened, moderately calcified leaflets. There was moderate to   severe stenosis. There was mild regurgitation. Mean gradient (S):   36 mm Hg. Peak gradient (S): 60 mm Hg. Valve area (VTI): 1.23   cm^2. Valve area (Vmax): 1.28 cm^2. Valve area (Vmean): 1.31   cm^2. - Mitral valve: Mean gradient (D): 2 mm Hg. Valve area by   continuity equation (using LVOT flow): 3.11 cm^2. - Left atrium: The atrium was severely dilated. - Right atrium: The atrium was mildly dilated. - Pulmonary arteries: Systolic pressure was moderately increased.   PA peak pressure: 47 mm Hg (S).  Echo 03/15/17: Study Conclusions  - Left ventricle: The cavity size was mildly dilated. There was   moderate concentric hypertrophy. Systolic function was severely   reduced. The estimated ejection fraction was in the range of 25%   to 30%. Diffuse hypokinesis with akinesis of the mid-apical   anteroseptal myocardium. Doppler parameters are consistent with a   reversible restrictive pattern, indicative of decreased left   ventricular diastolic compliance and/or increased left atrial   pressure (grade 3 diastolic dysfunction). Doppler parameters are   consistent with high ventricular filling pressure. - Aortic valve: Valve mobility was restricted. There was severe   stenosis. There was mild regurgitation. - Aorta: Ascending aortic diameter: 37 mm (S). - Mitral valve: Severely calcified annulus, mostly posterior.   Mobility of the posterior leaflet was restricted. Transvalvular   velocity was within the normal range. There was no evidence for   stenosis. There was trivial regurgitation. - Left  atrium: The atrium was severely dilated. - Right ventricle:  The cavity size was mildly dilated. Wall   thickness was normal. Systolic function was normal. - Right atrium: The atrium was mildly dilated. - Tricuspid valve: There was mild regurgitation. - Pulmonary arteries: Systolic pressure was severely increased. PA   peak pressure: 82 mm Hg (S).  ------------------------------------------------------------------- Study data:   Study status:  Routine.  Procedure:  The patient reported no pain pre or post test. Transthoracic echocardiography for left ventricular function evaluation, for right ventricular function evaluation, and for assessment of valvular function. Image quality was adequate.  Study completion:  There were no complications.          Echocardiography.  M-mode, complete 2D, spectral Doppler, and color Doppler.  Birthdate:  Patient birthdate: 06-Jul-1928.  Age:  Patient is 84 yr old.  Sex:  Gender: male.    BMI: 26.8 kg/m^2.  Blood pressure:     122/72  Patient status:  Outpatient.  Study date:  Study date: 03/15/2017. Study time: 11:53 AM.  Location:  [place] [facility]  -------------------------------------------------------------------  ------------------------------------------------------------------- Left ventricle:  The cavity size was mildly dilated. There was moderate concentric hypertrophy. Systolic function was severely reduced. The estimated ejection fraction was in the range of 25% to 30%.  Regional wall motion abnormalities:  Diffuse hypokinesis with akinesis of the mid-apical anteroseptal myocardium. Doppler parameters are consistent with a reversible restrictive pattern, indicative of decreased left ventricular diastolic compliance and/or increased left atrial pressure (grade 3 diastolic dysfunction). Doppler parameters are consistent with high ventricular filling pressure.  ------------------------------------------------------------------- Aortic  valve:   Trileaflet; severely thickened, severely calcified leaflets. Valve mobility was restricted.  Doppler:   There was severe stenosis.   There was mild regurgitation.    VTI ratio of LVOT to aortic valve: 0.25. Valve area (VTI): 0.85 cm^2. Indexed valve area (VTI): 0.41 cm^2/m^2. Peak velocity ratio of LVOT to aortic valve: 0.25. Valve area (Vmax): 0.86 cm^2. Indexed valve area (Vmax): 0.42 cm^2/m^2. Mean velocity ratio of LVOT to aortic valve: 0.25. Valve area (Vmean): 0.87 cm^2. Indexed valve area (Vmean): 0.42 cm^2/m^2.    Mean gradient (S): 40 mm Hg. Peak gradient (S): 62 mm Hg.  ------------------------------------------------------------------- Aorta:  Aortic root: The aortic root was normal in size. Ascending aorta: The ascending aorta was mildly dilated.  ------------------------------------------------------------------- Mitral valve:  Severely calcified annulus, mostly posterior. Mobility of the posterior leaflet was restricted.  Doppler: Transvalvular velocity was within the normal range. There was no evidence for stenosis. There was trivial regurgitation.    Peak gradient (D): 7 mm Hg.  ------------------------------------------------------------------- Left atrium:  The atrium was severely dilated.  ------------------------------------------------------------------- Right ventricle:  The cavity size was mildly dilated. Wall thickness was normal. Systolic function was normal.  ------------------------------------------------------------------- Pulmonic valve:    Doppler:  Transvalvular velocity was within the normal range. There was no evidence for stenosis.  ------------------------------------------------------------------- Tricuspid valve:   Structurally normal valve.    Doppler: Transvalvular velocity was within the normal range. There was mild regurgitation.  ------------------------------------------------------------------- Pulmonary artery:   The  main pulmonary artery was normal-sized. Systolic pressure was severely increased.  ------------------------------------------------------------------- Right atrium:  The atrium was mildly dilated.  ------------------------------------------------------------------- Pericardium:  There was no pericardial effusion.  ------------------------------------------------------------------- Systemic veins: Inferior vena cava: The vessel was dilated. The respirophasic diameter changes were blunted (< 50%), consistent with elevated central venous pressure.  ------------------------------------------------------------------- Measurements   Left ventricle  Value          Reference  LV ID, ED, PLAX chordal           (H)     54.5  mm       43 - 52  LV ID, ES, PLAX chordal           (H)     42.7  mm       23 - 38  LV fx shortening, PLAX chordal    (L)     22    %        >=29  LV PW thickness, ED                       14.1  mm       ---------  IVS/LV PW ratio, ED                       0.97           <=1.3  Stroke volume, 2D                         78    ml       ---------  Stroke volume/bsa, 2D                     38    ml/m^2   ---------  LV e&', lateral                            6.58  cm/s     ---------  LV E/e&', lateral                          19.6           ---------  LV e&', medial                             5.37  cm/s     ---------  LV E/e&', medial                           24.02          ---------  LV e&', average                            5.98  cm/s     ---------  LV E/e&', average                          21.59          ---------    Ventricular septum                        Value          Reference  IVS thickness, ED                         13.7  mm       ---------    LVOT  Value          Reference  LVOT ID, S                                21    mm       ---------  LVOT area                                 3.46   cm^2     ---------  LVOT ID                                   21    mm       ---------  LVOT peak velocity, S                     97.7  cm/s     ---------  LVOT mean velocity, S                     76.2  cm/s     ---------  LVOT VTI, S                               22.5  cm       ---------  LVOT peak gradient, S                     4     mm Hg    ---------  Stroke volume (SV), LVOT DP               77.9  ml       ---------  Stroke index (SV/bsa), LVOT DP            37.8  ml/m^2   ---------    Aortic valve                              Value          Reference  Aortic valve peak velocity, S             392   cm/s     ---------  Aortic valve mean velocity, S             303   cm/s     ---------  Aortic valve VTI, S                       91.4  cm       ---------  Aortic mean gradient, S                   40    mm Hg    ---------  Aortic peak gradient, S                   62    mm Hg    ---------  VTI ratio, LVOT/AV                        0.25           ---------  Aortic valve area, VTI  0.85  cm^2     ---------  Aortic valve area/bsa, VTI                0.41  cm^2/m^2 ---------  Velocity ratio, peak, LVOT/AV             0.25           ---------  Aortic valve area, peak velocity          0.86  cm^2     ---------  Aortic valve area/bsa, peak               0.42  cm^2/m^2 ---------  velocity  Velocity ratio, mean, LVOT/AV             0.25           ---------  Aortic valve area, mean velocity          0.87  cm^2     ---------  Aortic valve area/bsa, mean               0.42  cm^2/m^2 ---------  velocity    Aorta                                     Value          Reference  Aortic root ID, ED                        35    mm       ---------  Ascending aorta ID, A-P, S                37    mm       ---------    Left atrium                               Value          Reference  LA ID, A-P, ES                            59    mm       ---------  LA ID/bsa, A-P                     (H)     2.86  cm/m^2   <=2.2  LA volume, S                              144   ml       ---------  LA volume/bsa, S                          69.9  ml/m^2   ---------  LA volume, ES, 1-p A4C                    117   ml       ---------  LA volume/bsa, ES, 1-p A4C                56.8  ml/m^2   ---------  LA volume, ES, 1-p A2C  173   ml       ---------  LA volume/bsa, ES, 1-p A2C                84    ml/m^2   ---------    Mitral valve                              Value          Reference  Mitral E-wave peak velocity               129   cm/s     ---------  Mitral A-wave peak velocity               61.4  cm/s     ---------  Mitral deceleration time                  155   ms       150 - 230  Mitral peak gradient, D                   7     mm Hg    ---------  Mitral E/A ratio, peak                    2.1            ---------    Pulmonary arteries                        Value          Reference  PA pressure, S, DP                (H)     82    mm Hg    <=30    Tricuspid valve                           Value          Reference  Tricuspid regurg peak velocity            408   cm/s     ---------  Tricuspid peak RV-RA gradient             67    mm Hg    ---------    Systemic veins                            Value          Reference  Estimated CVP                             15    mm Hg    ---------    Right ventricle                           Value          Reference  RV pressure, S, DP                (H)     82    mm Hg    <=30  RV s&', lateral, S  14.3  cm/s     ---------  Legend: (L)  and  (H)  mark values outside specified reference range.  ------------------------------------------------------------------- Prepared and Electronically Authenticated by  Skeet Latch, MD 2019-02-07T15:50:49  RIGHT/LEFT HEART CATH AND CORONARY ANGIOGRAPHY  Conclusion     Ost LM lesion is 30% stenosed.  Prox LAD lesion is 20% stenosed.  LV end  diastolic pressure is moderately elevated.  There is severe aortic valve stenosis.  Hemodynamic findings consistent with moderate pulmonary hypertension.   1. Nonobstructive CAD. Old stent in LAD is patent 2. Moderate pulmonary venous HTN. Mean PAP 40 mm Hg 3. Moderately elevated LV filling pressures.  4. Severe Aortic stenosis. AV mean gradient 32 mm Hg, AV area 0.98 cm squared. Gradient relatively lower due to low EF.   Plan: refer to valve clinic for consideration of TAVR.    Indications   Nonrheumatic aortic valve stenosis [I35.0 (ICD-10-CM)]  Procedural Details/Technique   Technical Details Indication: 84 yo WM with progressive aortic stenosis. EF 25-30% by Echo  Procedural Details: The right wrist was prepped, draped, and anesthetized with 1% lidocaine. Using the modified Seldinger technique a 6 Fr slender sheath was placed in the right radial artery and a 5 French sheath was placed in the right brachial vein. A Swan-Ganz catheter was used for the right heart catheterization. Standard protocol was followed for recording of right heart pressures and sampling of oxygen saturations. Fick cardiac output was calculated. Standard Judkins catheters were used for selective coronary angiography and left ventriculography. There were no immediate procedural complications. The patient was transferred to the post catheterization recovery area for further monitoring.  Contrast: 50 cc   Estimated blood loss <50 mL.  During this procedure the patient was administered the following to achieve and maintain moderate conscious sedation: Versed 1 mg, Fentanyl 25 mcg, while the patient's heart rate, blood pressure, and oxygen saturation were continuously monitored. The period of conscious sedation was 36 minutes, of which I was present face-to-face 100% of this time.  Complications   Complications documented before study signed (04/06/2017 11:21 AM EST)    No complications were associated with this  study.  Documented by Martinique, Mahsa Hanser M, MD - 04/06/2017 11:18 AM EST    Coronary Findings   Diagnostic  Dominance: Right  Left Main  Ost LM lesion 30% stenosed  Ost LM lesion is 30% stenosed.  Left Anterior Descending  Prox LAD lesion 20% stenosed  Prox LAD lesion is 20% stenosed. The lesion was previously treated using a stent (unknown type) over 2 years ago.  Left Circumflex  Vessel was injected. Vessel is large. The vessel exhibits minimal luminal irregularities. The vessel is mildly calcified.  Right Coronary Artery  Vessel was injected. Vessel is large. There is mild diffuse disease throughout the vessel. The vessel is mildly calcified.  Intervention   No interventions have been documented.  Right Heart   Right Heart Pressures Hemodynamic findings consistent with moderate pulmonary hypertension.  Left Heart   Left Ventricle LV end diastolic pressure is moderately elevated.  Aortic Valve There is severe aortic valve stenosis. The aortic valve is calcified.  Coronary Diagrams   Diagnostic Diagram       Implants     No implant documentation for this case.  MERGE Images   Show images for CARDIAC CATHETERIZATION   Link to Procedure Log   Procedure Log    Hemo Data    Most Recent Value  Fick Cardiac Output 5.87 L/min  Fick Cardiac  Output Index 2.92 (L/min)/BSA  Aortic Mean Gradient 31.5 mmHg  Aortic Peak Gradient 35 mmHg  Aortic Valve Area 0.98  Aortic Value Area Index 0.49 cm2/BSA  RA A Wave 11 mmHg  RA V Wave 8 mmHg  RA Mean 7 mmHg  RV Systolic Pressure 69 mmHg  RV Diastolic Pressure 1 mmHg  RV EDP 11 mmHg  PA Systolic Pressure 67 mmHg  PA Diastolic Pressure 27 mmHg  PA Mean 40 mmHg  PW A Wave 32 mmHg  PW V Wave 42 mmHg  PW Mean 30 mmHg  AO Systolic Pressure 979 mmHg  AO Diastolic Pressure 65 mmHg  AO Mean 87 mmHg  LV Systolic Pressure 480 mmHg  LV Diastolic Pressure 10 mmHg  LV EDP 31 mmHg  Arterial Occlusion Pressure Extended Systolic Pressure  165 mmHg  Arterial Occlusion Pressure Extended Diastolic Pressure 66 mmHg  Arterial Occlusion Pressure Extended Mean Pressure 87 mmHg  Left Ventricular Apex Extended Systolic Pressure 537 mmHg  Left Ventricular Apex Extended Diastolic Pressure 7 mmHg  Left Ventricular Apex Extended EDP Pressure 32 mmHg  QP/QS 1  TPVR Index 13.69 HRUI  TSVR Index 29.79 HRUI  PVR SVR Ratio 0.13  TPVR/TSVR Ratio 0.46     Assessment / Plan:  1. Chronic systolic HF - EF 48-27%-  related to progressive AS. He is doing well clinically but does have chronic dyspnea on exertion class 2-3. Continue sodium restriction. Continue current diuretic dose.  Not a candidate for beta blockers due to bradycardia. Not a candidate for ACEi due to hypotension and CKD. Intolerant of nitrates due to hypotension. Medical therapy for his low EF is limited.  2. CAD -remote anterior MI and stent of LAD. Cardiac cath recently showed nonobstructive disease. No active symptoms.   3. Severe Aortic stenosis. Based on prior Echo gradient has increased, EF has decreased and he has more pulmonary HTN. We again discussed the natural history of his disease and the concern that he is at high risk for progressive LV dysfunction, CHF, arrhythmia, and death. He is still undecided about TAVR. Is really concerned about the cost and care concerns. He does not want to pursue TAVR at this time. Is thinking of getting a single level house.  4. CKD- stage 3.   5. Syncope. Most likely related to orthostasis on Flomax. Loop recorder without significant arrhythmia.   6. Situational depression/anxiety  7. Hyperlipidemia. On lipitor.

## 2017-12-10 ENCOUNTER — Ambulatory Visit: Payer: Medicare Other | Admitting: Cardiology

## 2017-12-10 ENCOUNTER — Encounter: Payer: Self-pay | Admitting: Cardiology

## 2017-12-10 VITALS — BP 129/74 | HR 58 | Ht 70.0 in | Wt 182.6 lb

## 2017-12-10 DIAGNOSIS — I10 Essential (primary) hypertension: Secondary | ICD-10-CM

## 2017-12-10 DIAGNOSIS — I2510 Atherosclerotic heart disease of native coronary artery without angina pectoris: Secondary | ICD-10-CM

## 2017-12-10 DIAGNOSIS — E7800 Pure hypercholesterolemia, unspecified: Secondary | ICD-10-CM

## 2017-12-10 DIAGNOSIS — I5022 Chronic systolic (congestive) heart failure: Secondary | ICD-10-CM

## 2017-12-10 DIAGNOSIS — I350 Nonrheumatic aortic (valve) stenosis: Secondary | ICD-10-CM | POA: Diagnosis not present

## 2017-12-10 NOTE — Patient Instructions (Signed)
Continue your current therapy  I will see you in 6 months.   

## 2017-12-13 ENCOUNTER — Emergency Department (HOSPITAL_BASED_OUTPATIENT_CLINIC_OR_DEPARTMENT_OTHER): Payer: Medicare Other

## 2017-12-13 ENCOUNTER — Encounter (HOSPITAL_COMMUNITY)
Admission: EM | Disposition: A | Discharge status: Hospice | Payer: Self-pay | Source: Home / Self Care | Attending: Internal Medicine

## 2017-12-13 ENCOUNTER — Encounter (HOSPITAL_BASED_OUTPATIENT_CLINIC_OR_DEPARTMENT_OTHER): Payer: Self-pay

## 2017-12-13 ENCOUNTER — Other Ambulatory Visit: Payer: Self-pay

## 2017-12-13 ENCOUNTER — Inpatient Hospital Stay (HOSPITAL_BASED_OUTPATIENT_CLINIC_OR_DEPARTMENT_OTHER)
Admission: EM | Admit: 2017-12-13 | Discharge: 2017-12-16 | DRG: 083 | Disposition: A | Discharge status: Hospice | Payer: Medicare Other | Attending: Internal Medicine | Admitting: Internal Medicine

## 2017-12-13 DIAGNOSIS — I130 Hypertensive heart and chronic kidney disease with heart failure and stage 1 through stage 4 chronic kidney disease, or unspecified chronic kidney disease: Secondary | ICD-10-CM | POA: Diagnosis present

## 2017-12-13 DIAGNOSIS — F419 Anxiety disorder, unspecified: Secondary | ICD-10-CM | POA: Diagnosis present

## 2017-12-13 DIAGNOSIS — R296 Repeated falls: Secondary | ICD-10-CM | POA: Diagnosis present

## 2017-12-13 DIAGNOSIS — R7989 Other specified abnormal findings of blood chemistry: Secondary | ICD-10-CM | POA: Diagnosis present

## 2017-12-13 DIAGNOSIS — S06340A Traumatic hemorrhage of right cerebrum without loss of consciousness, initial encounter: Secondary | ICD-10-CM | POA: Diagnosis not present

## 2017-12-13 DIAGNOSIS — E785 Hyperlipidemia, unspecified: Secondary | ICD-10-CM | POA: Diagnosis present

## 2017-12-13 DIAGNOSIS — I5022 Chronic systolic (congestive) heart failure: Secondary | ICD-10-CM | POA: Diagnosis present

## 2017-12-13 DIAGNOSIS — Z515 Encounter for palliative care: Secondary | ICD-10-CM | POA: Diagnosis not present

## 2017-12-13 DIAGNOSIS — Z888 Allergy status to other drugs, medicaments and biological substances status: Secondary | ICD-10-CM

## 2017-12-13 DIAGNOSIS — I350 Nonrheumatic aortic (valve) stenosis: Secondary | ICD-10-CM | POA: Diagnosis present

## 2017-12-13 DIAGNOSIS — I4891 Unspecified atrial fibrillation: Secondary | ICD-10-CM | POA: Diagnosis present

## 2017-12-13 DIAGNOSIS — R600 Localized edema: Secondary | ICD-10-CM | POA: Diagnosis present

## 2017-12-13 DIAGNOSIS — F4321 Adjustment disorder with depressed mood: Secondary | ICD-10-CM | POA: Diagnosis present

## 2017-12-13 DIAGNOSIS — N183 Chronic kidney disease, stage 3 (moderate): Secondary | ICD-10-CM | POA: Diagnosis present

## 2017-12-13 DIAGNOSIS — I272 Pulmonary hypertension, unspecified: Secondary | ICD-10-CM | POA: Diagnosis present

## 2017-12-13 DIAGNOSIS — Z66 Do not resuscitate: Secondary | ICD-10-CM | POA: Diagnosis present

## 2017-12-13 DIAGNOSIS — I959 Hypotension, unspecified: Secondary | ICD-10-CM | POA: Diagnosis present

## 2017-12-13 DIAGNOSIS — R2681 Unsteadiness on feet: Secondary | ICD-10-CM | POA: Diagnosis present

## 2017-12-13 DIAGNOSIS — Z955 Presence of coronary angioplasty implant and graft: Secondary | ICD-10-CM

## 2017-12-13 DIAGNOSIS — I619 Nontraumatic intracerebral hemorrhage, unspecified: Secondary | ICD-10-CM | POA: Diagnosis present

## 2017-12-13 DIAGNOSIS — Z87891 Personal history of nicotine dependence: Secondary | ICD-10-CM

## 2017-12-13 DIAGNOSIS — I252 Old myocardial infarction: Secondary | ICD-10-CM

## 2017-12-13 DIAGNOSIS — Z7982 Long term (current) use of aspirin: Secondary | ICD-10-CM

## 2017-12-13 DIAGNOSIS — R627 Adult failure to thrive: Secondary | ICD-10-CM | POA: Diagnosis present

## 2017-12-13 DIAGNOSIS — S065XAA Traumatic subdural hemorrhage with loss of consciousness status unknown, initial encounter: Secondary | ICD-10-CM

## 2017-12-13 DIAGNOSIS — M1990 Unspecified osteoarthritis, unspecified site: Secondary | ICD-10-CM | POA: Diagnosis present

## 2017-12-13 DIAGNOSIS — I2510 Atherosclerotic heart disease of native coronary artery without angina pectoris: Secondary | ICD-10-CM | POA: Diagnosis present

## 2017-12-13 DIAGNOSIS — Z7189 Other specified counseling: Secondary | ICD-10-CM

## 2017-12-13 DIAGNOSIS — R410 Disorientation, unspecified: Secondary | ICD-10-CM

## 2017-12-13 DIAGNOSIS — Z95818 Presence of other cardiac implants and grafts: Secondary | ICD-10-CM | POA: Diagnosis not present

## 2017-12-13 DIAGNOSIS — Z609 Problem related to social environment, unspecified: Secondary | ICD-10-CM | POA: Diagnosis present

## 2017-12-13 DIAGNOSIS — S065X9A Traumatic subdural hemorrhage with loss of consciousness of unspecified duration, initial encounter: Secondary | ICD-10-CM | POA: Diagnosis not present

## 2017-12-13 DIAGNOSIS — Z5320 Procedure and treatment not carried out because of patient's decision for unspecified reasons: Secondary | ICD-10-CM | POA: Diagnosis not present

## 2017-12-13 DIAGNOSIS — S06340D Traumatic hemorrhage of right cerebrum without loss of consciousness, subsequent encounter: Secondary | ICD-10-CM | POA: Diagnosis not present

## 2017-12-13 DIAGNOSIS — Z79899 Other long term (current) drug therapy: Secondary | ICD-10-CM

## 2017-12-13 DIAGNOSIS — W19XXXA Unspecified fall, initial encounter: Secondary | ICD-10-CM | POA: Diagnosis present

## 2017-12-13 LAB — BRAIN NATRIURETIC PEPTIDE: B NATRIURETIC PEPTIDE 5: 2231.5 pg/mL — AB (ref 0.0–100.0)

## 2017-12-13 LAB — CBC WITH DIFFERENTIAL/PLATELET
ABS IMMATURE GRANULOCYTES: 0.01 10*3/uL (ref 0.00–0.07)
BASOS PCT: 1 %
Basophils Absolute: 0 10*3/uL (ref 0.0–0.1)
Eosinophils Absolute: 0.1 10*3/uL (ref 0.0–0.5)
Eosinophils Relative: 2 %
HEMATOCRIT: 31.8 % — AB (ref 39.0–52.0)
HEMOGLOBIN: 10.1 g/dL — AB (ref 13.0–17.0)
Immature Granulocytes: 0 %
Lymphocytes Relative: 16 %
Lymphs Abs: 1 10*3/uL (ref 0.7–4.0)
MCH: 28.3 pg (ref 26.0–34.0)
MCHC: 31.8 g/dL (ref 30.0–36.0)
MCV: 89.1 fL (ref 80.0–100.0)
MONO ABS: 0.8 10*3/uL (ref 0.1–1.0)
MONOS PCT: 13 %
NEUTROS ABS: 4.3 10*3/uL (ref 1.7–7.7)
Neutrophils Relative %: 68 %
Platelets: 192 10*3/uL (ref 150–400)
RBC: 3.57 MIL/uL — AB (ref 4.22–5.81)
RDW: 15.8 % — AB (ref 11.5–15.5)
WBC: 6.3 10*3/uL (ref 4.0–10.5)
nRBC: 0 % (ref 0.0–0.2)

## 2017-12-13 LAB — COMPREHENSIVE METABOLIC PANEL
ALT: 16 U/L (ref 0–44)
ANION GAP: 11 (ref 5–15)
AST: 27 U/L (ref 15–41)
Albumin: 3.5 g/dL (ref 3.5–5.0)
Alkaline Phosphatase: 105 U/L (ref 38–126)
BUN: 33 mg/dL — ABNORMAL HIGH (ref 8–23)
CALCIUM: 9 mg/dL (ref 8.9–10.3)
CHLORIDE: 102 mmol/L (ref 98–111)
CO2: 26 mmol/L (ref 22–32)
CREATININE: 1.63 mg/dL — AB (ref 0.61–1.24)
GFR, EST AFRICAN AMERICAN: 42 mL/min — AB (ref >60)
GFR, EST NON AFRICAN AMERICAN: 36 mL/min — AB (ref >60)
Glucose, Bld: 108 mg/dL — ABNORMAL HIGH (ref 70–99)
Potassium: 3.6 mmol/L (ref 3.5–5.1)
SODIUM: 139 mmol/L (ref 135–145)
Total Bilirubin: 1.2 mg/dL (ref 0.3–1.2)
Total Protein: 6.9 g/dL (ref 6.5–8.1)

## 2017-12-13 LAB — MAGNESIUM: MAGNESIUM: 2.7 mg/dL — AB (ref 1.7–2.4)

## 2017-12-13 LAB — PROTIME-INR
INR: 1.29
Prothrombin Time: 16 seconds — ABNORMAL HIGH (ref 11.4–15.2)

## 2017-12-13 LAB — TROPONIN I: Troponin I: 0.05 ng/mL (ref <0.03)

## 2017-12-13 SURGERY — CRANIOTOMY HEMATOMA EVACUATION SUBDURAL
Anesthesia: General | Laterality: Right

## 2017-12-13 MED ORDER — TRAZODONE HCL 50 MG PO TABS: 50.0000 mg | ORAL_TABLET | Freq: Every evening | ORAL | Status: DC | PRN

## 2017-12-13 MED ORDER — NITROGLYCERIN 0.4 MG SL SUBL: 0.4000 mg | SUBLINGUAL_TABLET | SUBLINGUAL | Status: DC | PRN

## 2017-12-13 MED ORDER — SODIUM CHLORIDE 0.9% FLUSH
3.0000 mL | Freq: Two times a day (BID) | INTRAVENOUS | Status: DC
  Administered 2017-12-13 – 2017-12-16 (×5): 3 mL via INTRAVENOUS

## 2017-12-13 MED ORDER — POLYETHYLENE GLYCOL 3350 17 G PO PACK: 17.0000 g | PACK | Freq: Every day | ORAL | Status: DC | PRN

## 2017-12-13 MED ORDER — ACETAMINOPHEN 650 MG RE SUPP: 650.0000 mg | Freq: Four times a day (QID) | RECTAL | Status: DC | PRN

## 2017-12-13 MED ORDER — LORAZEPAM 2 MG/ML IJ SOLN
1.0000 mg | Freq: Four times a day (QID) | INTRAMUSCULAR | Status: DC | PRN
  Administered 2017-12-13: 1 mg via INTRAVENOUS
  Filled 2017-12-13: qty 1

## 2017-12-13 MED ORDER — ONDANSETRON HCL 4 MG/2ML IJ SOLN: 4.0000 mg | Freq: Four times a day (QID) | INTRAMUSCULAR | Status: DC | PRN

## 2017-12-13 MED ORDER — VITAMIN B-12 1000 MCG PO TABS
1000.0000 ug | ORAL_TABLET | Freq: Every day | ORAL | Status: DC
  Filled 2017-12-13: qty 1

## 2017-12-13 MED ORDER — DEXAMETHASONE SODIUM PHOSPHATE 10 MG/ML IJ SOLN
10.0000 mg | Freq: Once | INTRAMUSCULAR | Status: DC
  Filled 2017-12-13 (×2): qty 1

## 2017-12-13 MED ORDER — MORPHINE SULFATE (PF) 2 MG/ML IV SOLN
2.0000 mg | INTRAVENOUS | Status: DC | PRN
  Administered 2017-12-13 – 2017-12-14 (×2): 2 mg via INTRAVENOUS
  Filled 2017-12-13 (×2): qty 1

## 2017-12-13 MED ORDER — ACETAMINOPHEN 325 MG PO TABS: 650.0000 mg | ORAL_TABLET | Freq: Four times a day (QID) | ORAL | Status: DC | PRN

## 2017-12-13 MED ORDER — MAGNESIUM OXIDE 400 (241.3 MG) MG PO TABS: 400.0000 mg | ORAL_TABLET | Freq: Every evening | ORAL | Status: DC | PRN

## 2017-12-13 MED ORDER — OXYCODONE HCL 5 MG PO TABS: 5.0000 mg | ORAL_TABLET | ORAL | Status: DC | PRN

## 2017-12-13 MED ORDER — DEXTROSE-NACL 5-0.45 % IV SOLN
INTRAVENOUS | Status: DC
  Administered 2017-12-14: 02:00:00 via INTRAVENOUS

## 2017-12-13 MED ORDER — ATORVASTATIN CALCIUM 40 MG PO TABS
40.0000 mg | ORAL_TABLET | Freq: Every day | ORAL | Status: DC
  Filled 2017-12-13: qty 1

## 2017-12-13 MED ORDER — POTASSIUM CHLORIDE CRYS ER 20 MEQ PO TBCR
20.0000 meq | EXTENDED_RELEASE_TABLET | Freq: Every day | ORAL | Status: DC
  Filled 2017-12-13: qty 1

## 2017-12-13 MED ORDER — SODIUM CHLORIDE 0.9% FLUSH
3.0000 mL | INTRAVENOUS | Status: DC | PRN
  Administered 2017-12-13: 3 mL via INTRAVENOUS
  Filled 2017-12-13: qty 3

## 2017-12-13 MED ORDER — LEVETIRACETAM IN NACL 500 MG/100ML IV SOLN
INTRAVENOUS | Status: AC
  Filled 2017-12-13: qty 100

## 2017-12-13 MED ORDER — CALCIUM CARBONATE-VITAMIN D 500-200 MG-UNIT PO TABS
1.0000 | ORAL_TABLET | Freq: Every day | ORAL | Status: DC
  Filled 2017-12-13: qty 1

## 2017-12-13 MED ORDER — ALBUTEROL SULFATE (2.5 MG/3ML) 0.083% IN NEBU: 2.5000 mg | INHALATION_SOLUTION | RESPIRATORY_TRACT | Status: DC | PRN

## 2017-12-13 MED ORDER — LEVETIRACETAM IN NACL 1000 MG/100ML IV SOLN
INTRAVENOUS | Status: AC
  Filled 2017-12-13: qty 100

## 2017-12-13 MED ORDER — SODIUM CHLORIDE 0.9 % IV SOLN: 250.0000 mL | INTRAVENOUS | Status: DC | PRN

## 2017-12-13 MED ORDER — LEVETIRACETAM IN NACL 1500 MG/100ML IV SOLN
1500.0000 mg | Freq: Once | INTRAVENOUS | Status: DC
  Filled 2017-12-13: qty 100

## 2017-12-13 MED ORDER — SENNOSIDES-DOCUSATE SODIUM 8.6-50 MG PO TABS
2.0000 | ORAL_TABLET | Freq: Every day | ORAL | Status: DC
  Administered 2017-12-13: 2 via ORAL
  Filled 2017-12-13: qty 2

## 2017-12-13 MED ORDER — ONDANSETRON HCL 4 MG PO TABS: 4.0000 mg | ORAL_TABLET | Freq: Four times a day (QID) | ORAL | Status: DC | PRN

## 2017-12-13 MED ORDER — TRAZODONE HCL 50 MG PO TABS
50.0000 mg | ORAL_TABLET | Freq: Every day | ORAL | Status: DC
  Administered 2017-12-13: 50 mg via ORAL
  Filled 2017-12-13: qty 1

## 2017-12-13 NOTE — Progress Notes (Signed)
84 year old male at [facility] status post multiple falls last one was last week.  Presented with altered mental status and CT of the head showed subacute, subdural hemorrhage with possible area of new bleeding.  Discussed with Dr. Arnoldo Morale.  Patient wants DNR at this time and he refused surgery.  May benefit from admission and neurology input.  May end up comfort care.

## 2017-12-13 NOTE — ED Notes (Signed)
Troponin 0.05, results given to ED MD and Primary RN

## 2017-12-13 NOTE — Consult Note (Signed)
NEURO HOSPITALIST CONSULT NOTE   Requesting physician: Dr. Denton Brick  Reason for Consult: Traumatic late subacute right subdural hematoma   History obtained from:  Chart    HPI:                                                                                                                                          Jonathan Acevedo is an 84 y.o. male who was admitted to [facility] today for management of a large subacute right subdural hematoma with mass effect and midline shift. He initially presented to the ED with complaints of a 2 month history of worsening bilateral leg swelling and confusion. The subdural hematoma is felt most likely to be secondary to a fall, as the patient has had multiple recurrent falls.   Neurosurgery was consulted for possible evacuation of the hematoma; however, the patient refused surgical management. He has also refused intervention in the past for his severe aortic stenosis.   Past Medical History:  Diagnosis Date  . Afib ([place])   . Aortic stenosis   . Arthritis   . Cataract   . CHF (congestive heart failure) ([place])   . Coronary artery disease    w remote anterior myocardial infarction (1998)  . GERD (gastroesophageal reflux disease)   . Hypercalcemia 12/26/2011  . Hypercholesteremia   . Hypertension   . Inguinal hernia    right  . Kidney stones   . Myocardial infarct, old    3 times   . 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 Rectal polyp   . Renal insufficiency   . Syncope 05/18/2013  . Wears glasses     Past Surgical History:  Procedure Laterality Date  . CARDIAC CATHETERIZATION  08/18/2008   [place] 20, LAD stent 20 ISR, CFX 10, RCA 20, EF 45  . CATARACT EXTRACTION    . COLONOSCOPY W/ BIOPSIES AND POLYPECTOMY    . CORONARY STENT PLACEMENT  07/19/1996   Proximal LAD  . ELECTROPHYSIOLOGY STUDY  05-19-13   EPS for syncope without inducible arrhythmias by Dr Lovena Le  . ELECTROPHYSIOLOGY STUDY N/A 05/19/2013   Procedure: ELECTROPHYSIOLOGY  STUDY;  Surgeon: Evans Lance, MD;  Location: [facility] CATH LAB;  Service: Cardiovascular;  Laterality: N/A;  . FRACTURE SURGERY     right hip  . INGUINAL HERNIA REPAIR     left  . INGUINAL HERNIA REPAIR Right 02/09/2015   Procedure: RIGHT INGUINAL HERNIA REPAIR W/ MESH;  Surgeon: Jackolyn Confer, MD;  Location: [place];  Service: General;  Laterality: Right;  inguinal hernia  . INSERTION OF MESH Right 02/09/2015   Procedure: INSERTION OF MESH to right inguinal hernia;  Surgeon: Jackolyn Confer, MD;  Location: [place];  Service: General;  Laterality: Right;  . LEFT HEART CATHETERIZATION WITH CORONARY ANGIOGRAM N/A  05/19/2013   Procedure: LEFT HEART CATHETERIZATION WITH CORONARY ANGIOGRAM;  Surgeon: Peter M Martinique, MD;  Location: [facility] CATH LAB;  Service: Cardiovascular;  Laterality: N/A;  . LOOP RECORDER IMPLANT  05-19-13   MDT LinQ implanted by Dr Lovena Le for syncope following negative EPS  . ORIF FEMUR FRACTURE     right  . parathyroidectomy    . RIGHT/LEFT HEART CATH AND CORONARY ANGIOGRAPHY N/A 04/06/2017   Procedure: RIGHT/LEFT HEART CATH AND CORONARY ANGIOGRAPHY;  Surgeon: Martinique, Peter M, MD;  Location: [place] CV LAB;  Service: Cardiovascular;  Laterality: N/A;  . UMBILICAL HERNIA REPAIR      No family history on file.            Social History:  reports that he quit smoking about 21 years ago. His smoking use included cigarettes. He has a 45.00 pack-year smoking history. He has never used smokeless tobacco. He reports that he does not drink alcohol or use drugs.  Allergies  Allergen Reactions  . Ibandronic Acid Other (See Comments)    Muscle Aches-pt denies  . Risedronate Sodium Other (See Comments)    Muscle aches  . Flomax [Tamsulosin Hcl] Other (See Comments)    Syncope     HOME MEDICATIONS:                                                                                                                     Prior to Admission:  Medications Prior to Admission  Medication Sig Dispense  Refill Last Dose  . Acetaminophen (TYLENOL ARTHRITIS PAIN PO) Take by mouth as directed.   Taking  . aspirin 81 MG chewable tablet Chew 81 mg by mouth daily.    Taking  . atorvastatin (LIPITOR) 40 MG tablet TAKE 1 TABLET BY MOUTH  DAILY 90 tablet 1 Taking  . calcium-vitamin D (OSCAL WITH D) 500-200 MG-UNIT tablet Take 1 tablet by mouth daily with breakfast.   Taking  . furosemide (LASIX) 40 MG tablet TAKE 1 TABLET BY MOUTH IN  THE MORNING AND 1/2 TABLET  IN THE EVENING MAY TAKE ONE MORE TABLET FOR WEIGHT GAIN OR INCREASE SWELLING 180 tablet 2 Taking  . magnesium oxide (MAG-OX) 400 MG tablet Take 400-800 mg by mouth at bedtime as needed (leg cramps).   12 Taking  . nitroGLYCERIN (NITROSTAT) 0.4 MG SL tablet Place 1 tablet (0.4 mg total) under the tongue every 5 (five) minutes as needed for chest pain. 75 tablet 3 Taking  . potassium chloride SA (K-DUR,KLOR-CON) 20 MEQ tablet TAKE 1 TABLET BY MOUTH  DAILY 90 tablet 1 Taking  . vitamin B-12 (CYANOCOBALAMIN) 1000 MCG tablet Take 1,000 mcg by mouth every morning.   Taking   Scheduled: . atorvastatin  40 mg Oral Daily  . [START ON 12/14/2017] calcium-vitamin D  1 tablet Oral Q breakfast  . dexamethasone  10 mg Intravenous Once  . potassium chloride SA  20 mEq Oral Daily  . senna-docusate  2 tablet Oral QHS  . sodium chloride flush  3 mL Intravenous Q12H  . traZODone  50 mg Oral QHS  . [START ON 12/14/2017] vitamin B-12  1,000 mcg Oral Daily     ROS:                                                                                                                                       Unable to obtain due to AMS.    Blood pressure 113/73, pulse 68, temperature 97.7 F (36.5 C), resp. rate 17, height 5\' 10"  (1.778 m), weight 82.8 kg, SpO2 93 %.   General Examination:                                                                                                       Physical Exam  HEENT-  [place]/AT   Lungs-Respirations unlabored,  nonsonorous Extremities- Bilateral distal lower extremity edema and chronic pigmentary changes noted  Neurological Examination Mental Status: Somnolent. Arouses to a semiconscious agitated state with sternal rub and noxious plantar stimulation, during which he exclaims and moves limbs semipurposefully but does not open eyes, answer questions or follow commands.  Cranial Nerves: II: Eyes tightly shut. With eyes held open, pupils are equally reactive. No blink to threat. Does not avert eyes away from bright light stimulus.  III,IV, VI: Eyes at midline. Does not gaze towards or away from visual stimuli. No nystagmus.  V,VII: Face flaccidly symmetric with mouth held partially open VIII: Unable to assess.  IX,X: Unable to assess XI: Unable to assess XII: Does not protrude tongue.  Motor/Sensory: Moves upper extremities semipurposefully with grossly equal strength to sternal rub. Withdraws BLE to noxious plantar stimulation; no gross asymmetry noted.  Deep Tendon Reflexes: 3+ bilateral brachioradialis, biceps and patellae. 0 achilles bilaterally.  Plantars: Right: briskly upgoing   Left: upgoing Cerebellar/Gait: Unable to assess    Lab Results: Basic Metabolic Panel: Recent Labs  Lab 12/13/17 1453  NA 139  K 3.6  CL 102  CO2 26  GLUCOSE 108*  BUN 33*  CREATININE 1.63*  CALCIUM 9.0  MG 2.7*    CBC: Recent Labs  Lab 12/13/17 1453  WBC 6.3  NEUTROABS 4.3  HGB 10.1*  HCT 31.8*  MCV 89.1  PLT 192    Cardiac Enzymes: Recent Labs  Lab 12/13/17 1453  TROPONINI 0.05*    Lipid Panel: No results for input(s): CHOL, TRIG, HDL, CHOLHDL, VLDL, LDLCALC in the last 168 hours.  Imaging: Dg Chest 2 View  Result Date: 12/13/2017 CLINICAL DATA:  Chest pain, lower extremity swelling for 2 months. EXAM: CHEST - 2 VIEW COMPARISON:  Chest radiograph October 30, 2017 FINDINGS: Stable cardiomegaly. Mildly calcified aortic arch. Similar fullness of the hila most compatible with  vascular shadows. Mild chronic interstitial changes without pleural effusion or focal consolidation. No pneumothorax though the LEFT lung apex is obscured by facial structures. Soft tissue planes and included osseous structures are non suspicious. Loop monitor projects over LEFT chest. IMPRESSION: 1. Stable cardiomegaly. 2. Stable chronic interstitial changes/COPD. 3. Stable fullness of the hila most compatible with vascular shadows. Electronically Signed   By: Elon Alas M.D.   On: 12/13/2017 14:29   Ct Head Wo Contrast  Result Date: 12/13/2017 CLINICAL DATA:  Altered level of consciousness, fell several weeks ago hitting the head EXAM: CT HEAD WITHOUT CONTRAST TECHNIQUE: Contiguous axial images were obtained from the base of the skull through the vertex without intravenous contrast. COMPARISON:  None. FINDINGS: Brain: There is a moderately large subacute right subdural hematoma with intermediate density and some areas of higher attenuation indicating small focus of acute bleeding with layering posteriorly. On image number 21 series 2 this right subacute subdural hematoma has a depth of 2.2 cm. Frontally there is a component of this right subdural hematoma measuring 1.9 cm. The midline septum is post to the left by 7 mm. On the left there is an old left subdural fluid collection without current evidence of mass effect. This left-sided old subdural measures 8 mm in diameter. The ventricular system is slightly prominent. Only mild small vessel ischemic change is noted. No other area of hemorrhage, mass lesion, or other acute abnormality is seen. Vascular: No vascular abnormality is seen on this unenhanced study. Skull: On bone window images, no acute calvarial abnormality is seen. Sinuses/Orbits: The paranasal sinuses that are visualized appear well pneumatized. Other: None. IMPRESSION: 1. Subacute moderately large right subdural hematoma with intermediate density and a few small areas of higher  attenuation indicating some active bleeding. This right subdural hematoma measures 2.2 cm in depth. 2. Displacement of the midline septum to the left by 7 mm. 3. Old left subdural fluid collection of 8 mm in diameter. 4. Atrophy and mild small vessel ischemic change. Electronically Signed   By: Ivar Drape M.D.   On: 12/13/2017 14:28   US Venous Img Lower Right (dvt Study)  Result Date: 12/13/2017 CLINICAL DATA:  Right lower extremity pain and edema for the past 2 months. Evaluate for DVT. EXAM: RIGHT LOWER EXTREMITY VENOUS DOPPLER ULTRASOUND TECHNIQUE: Gray-scale sonography with graded compression, as well as color Doppler and duplex ultrasound were performed to evaluate the lower extremity deep venous systems from the level of the common femoral vein and including the common femoral, femoral, profunda femoral, popliteal and calf veins including the posterior tibial, peroneal and gastrocnemius veins when visible. The superficial great saphenous vein was also interrogated. Spectral Doppler was utilized to evaluate flow at rest and with distal augmentation maneuvers in the common femoral, femoral and popliteal veins. COMPARISON:  None. FINDINGS: Contralateral Common Femoral Vein: Respiratory phasicity is normal and symmetric with the symptomatic side. No evidence of thrombus. Normal compressibility. Common Femoral Vein: No evidence of thrombus. Normal compressibility, respiratory phasicity and response to augmentation. Saphenofemoral Junction: No evidence of thrombus. Normal compressibility and flow on color Doppler imaging. Profunda Femoral Vein: No evidence of thrombus. Normal compressibility and flow on color Doppler imaging. Femoral Vein: No evidence of thrombus. Normal compressibility, respiratory phasicity and response to augmentation. Popliteal Vein: No  evidence of thrombus. Normal compressibility, respiratory phasicity and response to augmentation. Calf Veins: No evidence of thrombus. Normal  compressibility and flow on color Doppler imaging. Superficial Great Saphenous Vein: No evidence of thrombus. Normal compressibility. Venous Reflux:  None. Other Findings:  None. IMPRESSION: No evidence of DVT within the right lower extremity. Electronically Signed   By: Sandi Mariscal M.D.   On: 12/13/2017 14:53    Assessment: 84 year old male with traumatic late subacute right subdural hematoma  1. CT head reveals the following:  Subacute moderately large right subdural hematoma with intermediate density and a few small areas of higher attenuation indicating some active bleeding. The subdural hematoma measures 2.2 cm in depth. There is displacement of the midline septum to the left by 7 mm. An od left subdural fluid collection of 8 mm in diameter is also noted.  2. The patient is somnolent to obtunded on neurological exam, awakening to a semiconscious state with semipurposeful movement, but not following commands or verbalizing coherently.   Recommendations: 1. The patient's CT findings and clinical exam as well as prognosis with and without surgical intervention was discussed with his health care POA, Dr. Kerri Perches [phone]). The patient's HCPOA wishes to respect the patient's earlier requests to not be managed surgically for the subdural, but consents to a repeat CT head.   2. Further recommendations pending repeat CT head.   Electronically signed: Dr. Kerney Elbe 12/13/2017, 10:23 PM

## 2017-12-13 NOTE — ED Notes (Signed)
Report to Raquel Sarna, Therapist, sports at [place]

## 2017-12-13 NOTE — Progress Notes (Signed)
Pt in bed slightly agitated MD in to see pt new orders written and noted Pt NPO but po medication with sips has not done [place] swallow test needs clarification of order. MD text paged

## 2017-12-13 NOTE — H&P (Signed)
Patient Demographics:    Jonathan Acevedo, is a 84 y.o. male  MRN: [id number]   DOB - April 02, 1928  Admit Date - 12/13/2017  Outpatient Primary MD for the patient is Lawerance Cruel, MD   Assessment & Plan:    Active Problems:   Subdural hematoma (HCC)   ICH (intracerebral hemorrhage) (HCC)    1)ICH/Subacute moderately large right subdural hematoma--- apparently EDP contacted Neurosurgeon Dr. Arnoldo Morale, consulted, patient is refusing neurosurgical intervention, d/w Dr Cheral Marker on-call neurologist, advised against antiepileptic prophylactically at this time given no reported seizures.  EDP previously gave 1 dose of Keppra, Plan is to repeat CT head in 24 hours... Patient may benefit from borehole / neurosurgical intervention, neurologist plans to  discuss more with patient's POA  2) Chronic systolic HF - EF 50-27%-  related to progressive AS-BNP elevated above 2200 , however clinically no acute exacerbation at this time, avoid high-dose diuretics due to decreased oral intake at this time continue low-salt diet  Not a candidate for beta blockers due to bradycardia. Not a candidate for ACEi due to hypotension and CKD. Intolerant of nitrates due to hypotension.  Overall effective medical therapy for his low EF is limited due to comorbid conditions and side effects/limitations listed above  3) CAD -remote anterior MI and stent of LAD. Cardiac cath recently showed nonobstructive disease-largely asymptomatic.,  Avoid antiplatelet agents due to Economy, avoid beta-blocker due to history of bradycardia and hypotension, continue Lipitor  4) Severe Aortic stenosis. Based on prior Echo gradient has increased, EF has decreased and he has more pulmonary HTN.  Cardiologist recently discussed the natural history of his disease and the concern  that he is at high risk for progressive LV dysfunction, CHF, arrhythmia, and death. He is still undecided about TAVR. Is really concerned about the cost and care concerns. He does not want to pursue TAVR at this time.  5)CKD- stage 3-creatinine is currently around 1.6 with a baseline around 1.5, hold diuretics, gentle hydration due to recurrent falls and concerns about orthostatic dizziness, watch for possible volume overload given low EF  6)Recurrent Falls---??? If  Syncope.  Loop recorder without significant arrhythmia, ???  Prostatic dizziness gentle IV fluids as above in # 4... Get PT/OT eval most likely will need placement to skilled nursing facility  7) Situational depression/anxiety--- has had been grieving since his wife passed away 2 years ago, he  takes lorazepam  continue this, give trazodone for sleep  8)Social/Ethics--- patient have no surviving blood relatives,, POA is   Tora Kindred  [phone]   Patient is a DNR/DNI, palliative care consult will be required for goals of care   With History of - Reviewed by me  Past Medical History:  Diagnosis Date  . Afib ([place])   . Aortic stenosis   . Arthritis   . Cataract   . CHF (congestive heart failure) ([place])   . Coronary artery disease    w remote anterior  myocardial infarction (1998)  . GERD (gastroesophageal reflux disease)   . Hypercalcemia 12/26/2011  . Hypercholesteremia   . Hypertension   . Inguinal hernia    right  . Kidney stones   . Myocardial infarct, old    3 times   . 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 Rectal polyp   . Renal insufficiency   . Syncope 05/18/2013  . Wears glasses       Past Surgical History:  Procedure Laterality Date  . CARDIAC CATHETERIZATION  08/18/2008   LMain 20, LAD stent 20 ISR, CFX 10, RCA 20, EF 45  . CATARACT EXTRACTION    . COLONOSCOPY W/ BIOPSIES AND POLYPECTOMY    . CORONARY STENT PLACEMENT  07/19/1996   Proximal LAD  . ELECTROPHYSIOLOGY STUDY  05-19-13   EPS for  syncope without inducible arrhythmias by Dr Lovena Le  . ELECTROPHYSIOLOGY STUDY N/A 05/19/2013   Procedure: ELECTROPHYSIOLOGY STUDY;  Surgeon: Evans Lance, MD;  Location[facility] C[facility] LAB;  Service: Cardiovascular;  Laterality: N/A;  . FRACTURE SURGERY     right hip  . INGUINAL HERNIA REPAIR     left  . INGUINAL HERNIA REPAIR Right 02/09/2015   Procedure: RIGHT INGUINAL HERNIA REPAIR W/ MESH;  Surgeon: Jackolyn Confer, MD;  Location: [place];  Service: General;  Laterality: Right;  inguinal hernia  . INSERTION OF MESH Right 02/09/2015   Procedure: INSERTION OF MESH to right inguinal hernia;  Surgeon: Jackolyn Confer, MD;  Location: [place];  Service: General;  Laterality: Right;  . LEFT HEART CATHETERIZATION WITH CORONARY ANGIOGRAM N/A 05/19/2013   Procedure: LEFT HEART CATHETERIZATION WITH CORONARY ANGIOGRAM;  Surgeon: Peter M Martinique, MD;  Location: [facility] CATH LAB;  Service: Cardiovascular;  Laterality: N/A;  . LOOP RECORDER IMPLANT  05-19-13   MDT LinQ implanted by Dr Lovena Le for syncope following negative EPS  . ORIF FEMUR FRACTURE     right  . parathyroidectomy    . RIGHT/LEFT HEART CATH AND CORONARY ANGIOGRAPHY N/A 04/06/2017   Procedure: RIGHT/LEFT HEART CATH AND CORONARY ANGIOGRAPHY;  Surgeon: Martinique, Peter M, MD;  Location: [place] [facility] LAB;  Service: Cardiovascular;  Laterality: N/A;  . UMBILICAL HERNIA REPAIR        Chief Complaint  Patient presents with  . Leg Swelling      HPI:    Jonathan Acevedo  is a 84 y.o. male  with history of CAD with remote MI and prior stent to the LAD, HTN, CKD, severe AS and HLD who was [facility] due to recurrent falls , confusion and unsteady gait.... At baseline patient is usually independent with ADLs he drives and takes care of himself typically, however over the few weeks he has had a steady cognitive and physical decline according to caregiver Ms. Carmie End....  In ED - CT head showed---- Subacute moderately  large right subdural hematoma with intermediate density and a few small areas of higher attenuation indicating some active bleeding. This right subdural hematoma measures 2.2 cm in depth,  Displacement of the midline septum to the left by 7 mm. Old left subdural fluid collection of 8 mm in diameter.,  Atrophy and mild small vessel ischemic change. Neurosurgeon Dr. Arnoldo Morale was consulted, patient is refusing neurosurgical intervention  Recurrent falls w/o Pilar Plate Syncope or CP, Patient has history of CAD as well as history of severe left ear, previously refused TAVR- cardiac cath on 04/06/17 with nonobstructive CAD and severe AS with AV mean gradient 32  mm Hg, AV area 0.98 cm squared. Gradient relatively lower due to low EF. , as well as moderate pulmonary venous hypertension. He was referred to the [facility] heart clinic and considered for TAVR but patient did not want to proceed.   Pt also has LE edema, LE Venous Doppler neg for acute DVT  POA is  Tora Kindred  [phone]     Review of systems:    In addition to the HPI above,   A full Review of  Systems was done, all other systems reviewed are negative except as noted above in HPI , .    Social History:  Reviewed by me  POA is  Tora Kindred  [phone]     Social History   Tobacco Use  . Smoking status: Former Smoker    Packs/day: 1.00    Years: 45.00    Pack years: 45.00    Types: Cigarettes    Last attempt to quit: 07/19/1996    Years since quitting: 21.4  . Smokeless tobacco: Never Used  Substance Use Topics  . Alcohol use: No       Family History :  Reviewed by me HTN  Home Medications:   Prior to Admission medications   Medication Sig Start Date End Date Taking? Authorizing Provider  Acetaminophen (TYLENOL ARTHRITIS PAIN PO) Take by mouth as directed.    [provider]  aspirin 81 MG chewable tablet Chew 81 mg by mouth daily.     [provider]  atorvastatin (LIPITOR) 40 MG tablet  TAKE 1 TABLET BY MOUTH  DAILY 11/05/17   Martinique, Peter M, MD  calcium-vitamin D (OSCAL WITH D) 500-200 MG-UNIT tablet Take 1 tablet by mouth daily with breakfast.    [provider]  furosemide (LASIX) 40 MG tablet TAKE 1 TABLET BY MOUTH IN  THE MORNING AND 1/2 TABLET  IN THE EVENING MAY TAKE ONE MORE TABLET FOR WEIGHT GAIN OR INCREASE SWELLING 09/03/17   Martinique, Peter M, MD  magnesium oxide (MAG-OX) 400 MG tablet Take 400-800 mg by mouth at bedtime as needed (leg cramps).  07/22/17   [provider]  nitroGLYCERIN (NITROSTAT) 0.4 MG SL tablet Place 1 tablet (0.4 mg total) under the tongue every 5 (five) minutes as needed for chest pain. 02/23/17   Martinique, Peter M, MD  potassium chloride SA (K-DUR,KLOR-CON) 20 MEQ tablet TAKE 1 TABLET BY MOUTH  DAILY 06/27/17   Martinique, Peter M, MD  vitamin B-12 (CYANOCOBALAMIN) 1000 MCG tablet Take 1,000 mcg by mouth every morning.    [provider]     Allergies:     Allergies  Allergen Reactions  . Ibandronic Acid Other (See Comments)    Muscle Aches-pt denies  . Risedronate Sodium Other (See Comments)    Muscle aches  . Flomax [Tamsulosin Hcl] Other (See Comments)    Syncope      Physical Exam:   Vitals  Blood pressure 113/73, pulse 68, temperature 97.7 F (36.5 C), resp. rate 17, height 5\' 10"  (1.778 m), weight 82.8 kg, SpO2 93 %.  Physical Examination: General appearance - alert, well appearing, and in no distress Mental status - alert, oriented to person, intermittently confused and disoriented Eyes - sclera anicteric Ears- HOH Neck - supple, no JVD elevation , Chest - clear  to auscultation bilaterally, symmetrical air movement,  Heart - S1 and S2 normal, 3/6 SM, regular Abdomen - soft, nontender, nondistended, no masses or organomegaly Neurological -generalized weakness, neuro exam is limited as patient  is having difficulty following commands, subtle weakness/hemiparesis on the left, no speech difficulties, no  swallowing difficulties, gait was not tested extremities -  1 to 2 + pitting pedal edema noted, chronic venous stasis changes , intact peripheral pulses  Skin - warm, dry     Data Review:    CBC Recent Labs  Lab 12/13/17 1453  WBC 6.3  HGB 10.1*  HCT 31.8*  PLT 192  MCV 89.1  MCH 28.3  MCHC 31.8  RDW 15.8*  LYMPHSABS 1.0  MONOABS 0.8  EOSABS 0.1  BASOSABS 0.0   ------------------------------------------------------------------------------------------------------------------  Chemistries  Recent Labs  Lab 12/13/17 1453  NA 139  K 3.6  CL 102  CO2 26  GLUCOSE 108*  BUN 33*  CREATININE 1.63*  CALCIUM 9.0  MG 2.7*  AST 27  ALT 16  ALKPHOS 105  BILITOT 1.2   ------------------------------------------------------------------------------------------------------------------ estimated creatinine clearance is 33 mL/min (A) (by C-G formula based on SCr of 1.63 mg/dL (H)). ------------------------------------------------------------------------------------------------------------------ No results for input(s): TSH, T4TOTAL, T3FREE, THYROIDAB in the last 72 hours.  Invalid input(s): FREET3   Coagulation profile Recent Labs  Lab 12/13/17 1453  INR 1.29   ------------------------------------------------------------------------------------------------------------------- No results for input(s): DDIMER in the last 72 hours. -------------------------------------------------------------------------------------------------------------------  Cardiac Enzymes Recent Labs  Lab 12/13/17 1453  TROPONINI 0.05*   ------------------------------------------------------------------------------------------------------------------    Component Value Date/Time   BNP 2,231.5 (H) 12/13/2017 1453     ---------------------------------------------------------------------------------------------------------------  Urinalysis    Component Value Date/Time   COLORURINE YELLOW  07/30/2017 2155   APPEARANCEUR CLEAR 07/30/2017 2155   LABSPEC 1.016 07/30/2017 2155   PHURINE 6.0 07/30/2017 2155   GLUCOSEU NEGATIVE 07/30/2017 2155   HGBUR SMALL (A) 07/30/2017 2155   BILIRUBINUR NEGATIVE 07/30/2017 2155   KETONESUR NEGATIVE 07/30/2017 2155   PROTEINUR NEGATIVE 07/30/2017 2155   UROBILINOGEN 0.2 05/04/2012 0158   NITRITE NEGATIVE 07/30/2017 2155   LEUKOCYTESUR NEGATIVE 07/30/2017 2155    ----------------------------------------------------------------------------------------------------------------   Imaging Results:    Dg Chest 2 View  Result Date: 12/13/2017 CLINICAL DATA:  Chest pain, lower extremity swelling for 2 months. EXAM: CHEST - 2 VIEW COMPARISON:  Chest radiograph October 30, 2017 FINDINGS: Stable cardiomegaly. Mildly calcified aortic arch. Similar fullness of the hila most compatible with vascular shadows. Mild chronic interstitial changes without pleural effusion or focal consolidation. No pneumothorax though the LEFT lung apex is obscured by facial structures. Soft tissue planes and included osseous structures are non suspicious. Loop monitor projects over LEFT chest. IMPRESSION: 1. Stable cardiomegaly. 2. Stable chronic interstitial changes/COPD. 3. Stable fullness of the hila most compatible with vascular shadows. Electronically Signed   By: Elon Alas M.D.   On: 12/13/2017 14:29   Ct Head Wo Contrast  Result Date: 12/13/2017 CLINICAL DATA:  Altered level of consciousness, fell several weeks ago hitting the head EXAM: CT HEAD WITHOUT CONTRAST TECHNIQUE: Contiguous axial images were obtained from the base of the skull through the vertex without intravenous contrast. COMPARISON:  None. FINDINGS: Brain: There is a moderately large subacute right subdural hematoma with intermediate density and some areas of higher attenuation indicating small focus of acute bleeding with layering posteriorly. On image number 21 series 2 this right subacute subdural  hematoma has a depth of 2.2 cm. Frontally there is a component of this right subdural hematoma measuring 1.9 cm. The midline septum is post to the left by 7 mm. On the left there is an old left subdural fluid collection without current evidence of mass effect. This left-sided old  subdural measures 8 mm in diameter. The ventricular system is slightly prominent. Only mild small vessel ischemic change is noted. No other area of hemorrhage, mass lesion, or other acute abnormality is seen. Vascular: No vascular abnormality is seen on this unenhanced study. Skull: On bone window images, no acute calvarial abnormality is seen. Sinuses/Orbits: The paranasal sinuses that are visualized appear well pneumatized. Other: None. IMPRESSION: 1. Subacute moderately large right subdural hematoma with intermediate density and a few small areas of higher attenuation indicating some active bleeding. This right subdural hematoma measures 2.2 cm in depth. 2. Displacement of the midline septum to the left by 7 mm. 3. Old left subdural fluid collection of 8 mm in diameter. 4. Atrophy and mild small vessel ischemic change. Electronically Signed   By: Ivar Drape M.D.   On: 12/13/2017 14:28   US Venous Img Lower Right (dvt Study)  Result Date: 12/13/2017 CLINICAL DATA:  Right lower extremity pain and edema for the past 2 months. Evaluate for DVT. EXAM: RIGHT LOWER EXTREMITY VENOUS DOPPLER ULTRASOUND TECHNIQUE: Gray-scale sonography with graded compression, as well as color Doppler and duplex ultrasound were performed to evaluate the lower extremity deep venous systems from the level of the common femoral vein and including the common femoral, femoral, profunda femoral, popliteal and calf veins including the posterior tibial, peroneal and gastrocnemius veins when visible. The superficial great saphenous vein was also interrogated. Spectral Doppler was utilized to evaluate flow at rest and with distal augmentation maneuvers in the common  femoral, femoral and popliteal veins. COMPARISON:  None. FINDINGS: Contralateral Common Femoral Vein: Respiratory phasicity is normal and symmetric with the symptomatic side. No evidence of thrombus. Normal compressibility. Common Femoral Vein: No evidence of thrombus. Normal compressibility, respiratory phasicity and response to augmentation. Saphenofemoral Junction: No evidence of thrombus. Normal compressibility and flow on color Doppler imaging. Profunda Femoral Vein: No evidence of thrombus. Normal compressibility and flow on color Doppler imaging. Femoral Vein: No evidence of thrombus. Normal compressibility, respiratory phasicity and response to augmentation. Popliteal Vein: No evidence of thrombus. Normal compressibility, respiratory phasicity and response to augmentation. Calf Veins: No evidence of thrombus. Normal compressibility and flow on color Doppler imaging. Superficial Great Saphenous Vein: No evidence of thrombus. Normal compressibility. Venous Reflux:  None. Other Findings:  None. IMPRESSION: No evidence of DVT within the right lower extremity. Electronically Signed   By: Sandi Mariscal M.D.   On: 12/13/2017 14:53    Radiological Exams on Admission: Dg Chest 2 View  Result Date: 12/13/2017 CLINICAL DATA:  Chest pain, lower extremity swelling for 2 months. EXAM: CHEST - 2 VIEW COMPARISON:  Chest radiograph October 30, 2017 FINDINGS: Stable cardiomegaly. Mildly calcified aortic arch. Similar fullness of the hila most compatible with vascular shadows. Mild chronic interstitial changes without pleural effusion or focal consolidation. No pneumothorax though the LEFT lung apex is obscured by facial structures. Soft tissue planes and included osseous structures are non suspicious. Loop monitor projects over LEFT chest. IMPRESSION: 1. Stable cardiomegaly. 2. Stable chronic interstitial changes/COPD. 3. Stable fullness of the hila most compatible with vascular shadows. Electronically Signed   By:  Elon Alas M.D.   On: 12/13/2017 14:29   Ct Head Wo Contrast  Result Date: 12/13/2017 CLINICAL DATA:  Altered level of consciousness, fell several weeks ago hitting the head EXAM: CT HEAD WITHOUT CONTRAST TECHNIQUE: Contiguous axial images were obtained from the base of the skull through the vertex without intravenous contrast. COMPARISON:  None. FINDINGS: Brain: There is a  moderately large subacute right subdural hematoma with intermediate density and some areas of higher attenuation indicating small focus of acute bleeding with layering posteriorly. On image number 21 series 2 this right subacute subdural hematoma has a depth of 2.2 cm. Frontally there is a component of this right subdural hematoma measuring 1.9 cm. The midline septum is post to the left by 7 mm. On the left there is an old left subdural fluid collection without current evidence of mass effect. This left-sided old subdural measures 8 mm in diameter. The ventricular system is slightly prominent. Only mild small vessel ischemic change is noted. No other area of hemorrhage, mass lesion, or other acute abnormality is seen. Vascular: No vascular abnormality is seen on this unenhanced study. Skull: On bone window images, no acute calvarial abnormality is seen. Sinuses/Orbits: The paranasal sinuses that are visualized appear well pneumatized. Other: None. IMPRESSION: 1. Subacute moderately large right subdural hematoma with intermediate density and a few small areas of higher attenuation indicating some active bleeding. This right subdural hematoma measures 2.2 cm in depth. 2. Displacement of the midline septum to the left by 7 mm. 3. Old left subdural fluid collection of 8 mm in diameter. 4. Atrophy and mild small vessel ischemic change. Electronically Signed   By: Ivar Drape M.D.   On: 12/13/2017 14:28   US Venous Img Lower Right (dvt Study)  Result Date: 12/13/2017 CLINICAL DATA:  Right lower extremity pain and edema for the past 2  months. Evaluate for DVT. EXAM: RIGHT LOWER EXTREMITY VENOUS DOPPLER ULTRASOUND TECHNIQUE: Gray-scale sonography with graded compression, as well as color Doppler and duplex ultrasound were performed to evaluate the lower extremity deep venous systems from the level of the common femoral vein and including the common femoral, femoral, profunda femoral, popliteal and calf veins including the posterior tibial, peroneal and gastrocnemius veins when visible. The superficial great saphenous vein was also interrogated. Spectral Doppler was utilized to evaluate flow at rest and with distal augmentation maneuvers in the common femoral, femoral and popliteal veins. COMPARISON:  None. FINDINGS: Contralateral Common Femoral Vein: Respiratory phasicity is normal and symmetric with the symptomatic side. No evidence of thrombus. Normal compressibility. Common Femoral Vein: No evidence of thrombus. Normal compressibility, respiratory phasicity and response to augmentation. Saphenofemoral Junction: No evidence of thrombus. Normal compressibility and flow on color Doppler imaging. Profunda Femoral Vein: No evidence of thrombus. Normal compressibility and flow on color Doppler imaging. Femoral Vein: No evidence of thrombus. Normal compressibility, respiratory phasicity and response to augmentation. Popliteal Vein: No evidence of thrombus. Normal compressibility, respiratory phasicity and response to augmentation. Calf Veins: No evidence of thrombus. Normal compressibility and flow on color Doppler imaging. Superficial Great Saphenous Vein: No evidence of thrombus. Normal compressibility. Venous Reflux:  None. Other Findings:  None. IMPRESSION: No evidence of DVT within the right lower extremity. Electronically Signed   By: Sandi Mariscal M.D.   On: 12/13/2017 14:53    DVT Prophylaxis -SCD (ICH) AM Labs Ordered, also please review Full Orders  Family Communication: Admission, patients condition and plan of care including tests  being ordered have been discussed with the patient and Carmie End who indicate understanding and agree with the plan   Code Status - Full Code  Likely DC to  TBD  Condition   stable  Roxan Hockey M.D on 12/13/2017 at 8:49 PM Pager---[phone] Go to [email or web address] - [email or web address] for contact info  [facility] - Office  [phone]

## 2017-12-13 NOTE — ED Provider Notes (Addendum)
Emergency Department Provider Note   I have reviewed the triage vital signs and the nursing notes.   HISTORY  Chief Complaint Leg Swelling   HPI Jonathan Acevedo is a 84 y.o. male with PMH of Afib, AS, HLD, HTN, CAD and CHF (EF 25-30%) presents to the emergency department for evaluation of worsening lower extremity swelling, generalized weakness, chest pain this morning, with some associated confusion.  The patient has been on a somewhat progressive decline according to friend at bedside.  He lives alone is typically able to perform his ADLs without significant difficulty.  He has been having more falls including one 2 weeks ago.  He did see his primary care physician for evaluation.  No CT at that time. He is not anticoagulated.  Patient saw his cardiologist, Dr. Martinique, yesterday and has continued with his 80 mg of Lasix daily.  When his family member came to his house today he seemed more confused than normal and was complaining of right sided chest pain.  Denies shortness of breath.  No new medications.  No fevers or chills. Friend at bedside states that he was very weak and was complaining of pain worse in the right leg.  He was unable to get off of the couch. Patient has no family involved in his life and his wife died 2 years prior.   Past Medical History:  Diagnosis Date  . Afib ([place])   . Aortic stenosis   . Arthritis   . Cataract   . CHF (congestive heart failure) ([place])   . Coronary artery disease    w remote anterior myocardial infarction (1998)  . GERD (gastroesophageal reflux disease)   . Hypercalcemia 12/26/2011  . Hypercholesteremia   . Hypertension   . Inguinal hernia    right  . Kidney stones   . Myocardial infarct, old    3 times   . 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 Rectal polyp   . Renal insufficiency   . Syncope 05/18/2013  . Wears glasses     Patient Active Problem List   Diagnosis Date Noted  . Nuclear sclerotic cataract of right eye 11/07/2016  .  Right inguinal hernia 01/06/2015  . Chronic lower back pain 03/04/2014  . History of chronic back pain 10/05/2013  . Acute on chronic diastolic CHF (congestive heart failure), NYHA class 3 ([place]) 08/12/2013  . Chest pain 08/12/2013  . Acute on chronic systolic congestive heart failure ([place]) 05/25/2013  . Acute on chronic combined systolic and diastolic CHF (congestive heart failure) ([place]) 05/18/2013  . Syncope 05/18/2013  . Varicose veins of lower extremities with other complications 87/56/4332  . Chronic venous insufficiency 07/29/2012  . Edema 07/18/2012  . Venous (peripheral) insufficiency 07/18/2012  . Iron deficiency anemia 03/29/2012  . Postoperative hypothyroidism 03/25/2012  . Tobacco abuse 01/03/2012  . Essential hypertension 01/03/2012  . 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 Osteoporosis 07/21/2011  . Hyperparathyroidism ([place]) 07/21/2011  . Chronic systolic CHF (congestive heart failure) ([place]) 07/03/2011  . Primary hyperparathyroidism ([place]) 07/03/2011  . Aortic valve disorder 06/01/2011  . Hypertrophy of prostate without urinary obstruction and other lower urinary tract symptoms (LUTS) 06/01/2011  . Ischemic cardiomyopathy 11/29/2010  . Myocardial infarction ([place])   . Coronary artery disease   . Hypertension   . Chronic renal insufficiency, stage III (moderate) ([facility])   . Aortic stenosis   . Rectal polyp   . Hypercholesteremia   . Impotence 03/25/2010  . Vitamin D deficiency 03/25/2010  . Hyperlipemia 08/06/2007  .  Sensory hearing loss, bilateral 06/19/2007  . Chronic renal insufficiency 11/12/2006  . Hypercalcemia 12/11/2005    Past Surgical History:  Procedure Laterality Date  . CARDIAC CATHETERIZATION  08/18/2008   [place] 20, LAD stent 20 ISR, CFX 10, RCA 20, EF 45  . CATARACT EXTRACTION    . COLONOSCOPY W/ BIOPSIES AND POLYPECTOMY    . CORONARY STENT PLACEMENT  07/19/1996   Proximal LAD  . ELECTROPHYSIOLOGY STUDY  05-19-13   EPS for syncope without inducible  arrhythmias by Dr Lovena Le  . ELECTROPHYSIOLOGY STUDY N/A 05/19/2013   Procedure: ELECTROPHYSIOLOGY STUDY;  Surgeon: Evans Lance, MD;  Location[facility] CATH LAB;  Service: Cardiovascular;  Laterality: N/A;  . FRACTURE SURGERY     right hip  . INGUINAL HERNIA REPAIR     left  . INGUINAL HERNIA REPAIR Right 02/09/2015   Procedure: RIGHT INGUINAL HERNIA REPAIR W/ MESH;  Surgeon: Jackolyn Confer, MD;  Location: [place];  Service: General;  Laterality: Right;  inguinal hernia  . INSERTION OF MESH Right 02/09/2015   Procedure: INSERTION OF MESH to right inguinal hernia;  Surgeon: Jackolyn Confer, MD;  Location: [place];  Service: General;  Laterality: Right;  . LEFT HEART CATHETERIZATION WITH CORONARY ANGIOGRAM N/A 05/19/2013   Procedure: LEFT HEART CATHETERIZATION WITH CORONARY ANGIOGRAM;  Surgeon: Peter M Martinique, MD;  Location: [facility] CATH LAB;  Service: Cardiovascular;  Laterality: N/A;  . LOOP RECORDER IMPLANT  05-19-13   MDT LinQ implanted by Dr Lovena Le for syncope following negative EPS  . ORIF FEMUR FRACTURE     right  . parathyroidectomy    . RIGHT/LEFT HEART CATH AND CORONARY ANGIOGRAPHY N/A 04/06/2017   Procedure: RIGHT/LEFT HEART CATH AND CORONARY ANGIOGRAPHY;  Surgeon: Martinique, Peter M, MD;  Location: [facility] CV LAB;  Service: Cardiovascular;  Laterality: N/A;  . UMBILICAL HERNIA REPAIR      Allergies Ibandronic acid; Risedronate sodium; and Flomax [tamsulosin hcl]  No family history on file.  Social History Social History   Tobacco Use  . Smoking status: Former Smoker    Packs/day: 1.00    Years: 45.00    Pack years: 45.00    Types: Cigarettes    Last attempt to quit: 07/19/1996    Years since quitting: 21.4  . Smokeless tobacco: Never Used  Substance Use Topics  . Alcohol use: No  . Drug use: No    Review of Systems  Constitutional: No fever/chills. Positive generalized weakness.  Eyes: No visual changes. ENT: No sore throat. Cardiovascular: Positive chest pain. Positive LE  edema.  Respiratory: Denies shortness of breath. Gastrointestinal: No abdominal pain. No nausea, no vomiting.  No diarrhea.  No constipation. Genitourinary: Negative for dysuria. Musculoskeletal: Negative for back pain. Skin: Negative for rash. Neurological: Negative for headaches, focal weakness or numbness. Positive confusion at home.   10-point ROS otherwise negative.  ____________________________________________   PHYSICAL EXAM:  VITAL SIGNS: ED Triage Vitals  Enc Vitals Group     BP 12/13/17 1321 113/77     Pulse Rate 12/13/17 1321 70     Resp 12/13/17 1321 18     Temp 12/13/17 1321 97.7 F (36.5 C)     Temp Source 12/13/17 1321 Oral     SpO2 12/13/17 1321 97 %     Weight 12/13/17 1323 182 lb 8.7 oz (82.8 kg)     Height 12/13/17 1323 5\' 10"  (1.778 m)     Pain Score 12/13/17 1321 9   Constitutional: Alert and oriented. Well appearing and  in no acute distress. Eyes: Conjunctivae are normal. PERRL. Head: Atraumatic. Nose: No congestion/rhinnorhea. Mouth/Throat: Mucous membranes are moist.  Neck: No stridor.   Cardiovascular: Normal rate, regular rhythm. Good peripheral circulation. Loud 5/6 systolic murmur loudest to the right of the sternum. 4+ pitting edema.  Respiratory: Normal respiratory effort.  No retractions. Lungs CTAB. Gastrointestinal: Soft and nontender. No distention.  Musculoskeletal: No gross deformities of extremities. Positive B/L LE edema with some mild tenderness over the posterior calf and popliteal region.  Neurologic:  Normal speech and language. No gross focal neurologic deficits are appreciated.  Skin:  Skin is warm, dry and intact. No rash noted.  ____________________________________________   LABS (all labs ordered are listed, but only abnormal results are displayed)  Labs Reviewed  COMPREHENSIVE METABOLIC PANEL - Abnormal; Notable for the following components:      Result Value   Glucose, Bld 108 (*)    BUN 33 (*)    Creatinine, Ser  1.63 (*)    GFR calc non Af Amer 36 (*)    GFR calc Af Amer 42 (*)    All other components within normal limits  BRAIN NATRIURETIC PEPTIDE - Abnormal; Notable for the following components:   B Natriuretic Peptide 2,231.5 (*)    All other components within normal limits  TROPONIN I - Abnormal; Notable for the following components:   Troponin I 0.05 (*)    All other components within normal limits  CBC WITH DIFFERENTIAL/PLATELET - Abnormal; Notable for the following components:   RBC 3.57 (*)    Hemoglobin 10.1 (*)    HCT 31.8 (*)    RDW 15.8 (*)    All other components within normal limits  MAGNESIUM - Abnormal; Notable for the following components:   Magnesium 2.7 (*)    All other components within normal limits  PROTIME-INR - Abnormal; Notable for the following components:   Prothrombin Time 16.0 (*)    All other components within normal limits  URINALYSIS, ROUTINE W REFLEX MICROSCOPIC   ____________________________________________  RADIOLOGY  Dg Chest 2 View  Result Date: 12/13/2017 CLINICAL DATA:  Chest pain, lower extremity swelling for 2 months. EXAM: CHEST - 2 VIEW COMPARISON:  Chest radiograph October 30, 2017 FINDINGS: Stable cardiomegaly. Mildly calcified aortic arch. Similar fullness of the hila most compatible with vascular shadows. Mild chronic interstitial changes without pleural effusion or focal consolidation. No pneumothorax though the LEFT lung apex is obscured by facial structures. Soft tissue planes and included osseous structures are non suspicious. Loop monitor projects over LEFT chest. IMPRESSION: 1. Stable cardiomegaly. 2. Stable chronic interstitial changes/COPD. 3. Stable fullness of the hila most compatible with vascular shadows. Electronically Signed   By: Elon Alas M.D.   On: 12/13/2017 14:29   Ct Head Wo Contrast  Result Date: 12/13/2017 CLINICAL DATA:  Altered level of consciousness, fell several weeks ago hitting the head EXAM: CT HEAD  WITHOUT CONTRAST TECHNIQUE: Contiguous axial images were obtained from the base of the skull through the vertex without intravenous contrast. COMPARISON:  None. FINDINGS: Brain: There is a moderately large subacute right subdural hematoma with intermediate density and some areas of higher attenuation indicating small focus of acute bleeding with layering posteriorly. On image number 21 series 2 this right subacute subdural hematoma has a depth of 2.2 cm. Frontally there is a component of this right subdural hematoma measuring 1.9 cm. The midline septum is post to the left by 7 mm. On the left there is an old left subdural  fluid collection without current evidence of mass effect. This left-sided old subdural measures 8 mm in diameter. The ventricular system is slightly prominent. Only mild small vessel ischemic change is noted. No other area of hemorrhage, mass lesion, or other acute abnormality is seen. Vascular: No vascular abnormality is seen on this unenhanced study. Skull: On bone window images, no acute calvarial abnormality is seen. Sinuses/Orbits: The paranasal sinuses that are visualized appear well pneumatized. Other: None. IMPRESSION: 1. Subacute moderately large right subdural hematoma with intermediate density and a few small areas of higher attenuation indicating some active bleeding. This right subdural hematoma measures 2.2 cm in depth. 2. Displacement of the midline septum to the left by 7 mm. 3. Old left subdural fluid collection of 8 mm in diameter. 4. Atrophy and mild small vessel ischemic change. Electronically Signed   By: Ivar Drape M.D.   On: 12/13/2017 14:28   US Venous Img Lower Right (dvt Study)  Result Date: 12/13/2017 CLINICAL DATA:  Right lower extremity pain and edema for the past 2 months. Evaluate for DVT. EXAM: RIGHT LOWER EXTREMITY VENOUS DOPPLER ULTRASOUND TECHNIQUE: Gray-scale sonography with graded compression, as well as color Doppler and duplex ultrasound were performed to  evaluate the lower extremity deep venous systems from the level of the common femoral vein and including the common femoral, femoral, profunda femoral, popliteal and calf veins including the posterior tibial, peroneal and gastrocnemius veins when visible. The superficial great saphenous vein was also interrogated. Spectral Doppler was utilized to evaluate flow at rest and with distal augmentation maneuvers in the common femoral, femoral and popliteal veins. COMPARISON:  None. FINDINGS: Contralateral Common Femoral Vein: Respiratory phasicity is normal and symmetric with the symptomatic side. No evidence of thrombus. Normal compressibility. Common Femoral Vein: No evidence of thrombus. Normal compressibility, respiratory phasicity and response to augmentation. Saphenofemoral Junction: No evidence of thrombus. Normal compressibility and flow on color Doppler imaging. Profunda Femoral Vein: No evidence of thrombus. Normal compressibility and flow on color Doppler imaging. Femoral Vein: No evidence of thrombus. Normal compressibility, respiratory phasicity and response to augmentation. Popliteal Vein: No evidence of thrombus. Normal compressibility, respiratory phasicity and response to augmentation. Calf Veins: No evidence of thrombus. Normal compressibility and flow on color Doppler imaging. Superficial Great Saphenous Vein: No evidence of thrombus. Normal compressibility. Venous Reflux:  None. Other Findings:  None. IMPRESSION: No evidence of DVT within the right lower extremity. Electronically Signed   By: Sandi Mariscal M.D.   On: 12/13/2017 14:53    EKG:    EKG Interpretation  Date/Time:  Thursday December 13 2017 14:55:06 EST Ventricular Rate:  74 PR Interval:    QRS Duration: 121 QT Interval:  429 QTC Calculation: 476 R Axis:   -68 Text Interpretation:  Sinus rhythm Nonspecific IVCD with LAD Left ventricular hypertrophy Anterior infarct, old No STEMI Confirmed by Nanda Quinton [id number]) on 12/13/2017  4:10:04 PM      ____________________________________________   PROCEDURES  Procedure(s) performed:   Procedures  CRITICAL CARE Performed by: Margette Fast Total critical care time: 75 minutes Critical care time was exclusive of separately billable procedures and treating other patients. Critical care was necessary to treat or prevent imminent or life-threatening deterioration. Critical care was time spent personally by me on the following activities: development of treatment plan with patient and/or surrogate as well as nursing, discussions with consultants, evaluation of patient's response to treatment, examination of patient, obtaining history from patient or surrogate, ordering and performing treatments and interventions, ordering  and review of laboratory studies, ordering and review of radiographic studies, pulse oximetry and re-evaluation of patient's condition.  Nanda Quinton, MD Emergency Medicine  ____________________________________________   INITIAL IMPRESSION / ASSESSMENT AND PLAN / ED COURSE  Pertinent labs & imaging results that were available during my care of the patient were reviewed by me and considered in my medical decision making (see chart for details).  Patient presents to the emergency department with right-sided chest pain, generalized weakness in the setting of lower extremity edema.  He has had multiple recent falls at home.  No active chest pain at this time.  He saw his cardiologist yesterday do not want to increase his Lasix at this time with concern for dehydration and electrolyte abnormalities.  Not have signs or symptoms of significant pulmonary edema.  Plan to obtain CT head with multiple recent falls to rule out subacute subdural. CXR with the right sided chest pain and DVT RLE with right leg pain. Legs are, however, symetrically swollen. Afebrile here. No UTI symptoms.   02:52 PM Spoke with Dr. Arnoldo Morale. He would like to evaluate in the ED with  medicine admit given significant the significant size of the subdural along with the suspected active component of bleeding.  He recommends urgent transfer to the [facility] emergency department where he can evaluate and likely take for surgery.  I spoke with the hospitalist and Dr. Eulis Foster who accept the patient to the emergency department.  The hospitalist can evaluate and admit from the emergency department.   03:32 PM I had a Harmonee Tozer discussion with the patient and friend at bedside. After considering the plan for urgent transfer to the emergency department for evacuation of the hematoma the patient has decided that he does not want surgery under any circumstances.  I discussed that given the symptoms he is having along with the bleeding we are seeing on CT that this is likely to lead to his death. He verbalizes understanding to me that he realizes that surgery can help to avoid this and that he would be expected to re-gain function. I offered urgent ED transfer to discuss with the Neurosurgeon further regarding the procedure but he states that he understands but does not want surgery under any circumstances. He stated this to myself and friend at bedside over and over. Discussed risks and benefits in detail. He has also refused AS repair in the recent past. He tells me that if he gets worse then "I'm ready to go." Patient does have capacity to make this decision on my evaluation.   Updated Dr. Arnoldo Morale by phone that the patient is refusing urgent transfer to the ED for evaluation and surgery. He is opting for medical mgmt and comfort care if his clinical conditions worsens. Discussed with the hospitalist who will admit to Tele Neuro for monitoring.  Discussed patient's case with Hospitalist, Dr. Laren Everts to request admission. Patient and family (if present) updated with plan. Care transferred to Hospitalist service.  I reviewed all nursing notes, vitals, pertinent old records, EKGs, labs, imaging (as  available).  4:07 PM Patient refusing Keppra and Decadron. No change in above plan. With patient refusing medical mgmt as well will have the hospitalist team consult Neurology if desired.   ____________________________________________  FINAL CLINICAL IMPRESSION(S) / ED DIAGNOSES  Final diagnoses:  SDH (subdural hematoma) ([place])  Disorientation     MEDICATIONS GIVEN DURING THIS VISIT:  Medications  dexamethasone (DECADRON) injection 10 mg (has no administration in time range)  levETIRAcetam (KEPPRA) IVPB  1500 mg/ 100 mL premix (has no administration in time range)  levETIRAcetam (KEPRRA) 500 MG/100ML IVPB SOLN (has no administration in time range)  levETIRAcetam (KEPPRA) 1000 MG/100ML IVPB (has no administration in time range)     Note:  This document was prepared using Dragon voice recognition software and may include unintentional dictation errors.  Nanda Quinton, MD Emergency Medicine    Dearius Hoffmann, [facility], MD 12/13/17 1608    Margette Fast, MD 12/13/17 [phone]

## 2017-12-13 NOTE — ED Triage Notes (Addendum)
Per pt and caretaker pt with bilat leg swelling-worse x 2 months-pain to right to LE and weakness, some confusion "he had trouble following directions" this am-pt to triage in w/c-NAD-HOH so he requested caretaker answer most ?s-pt was appropriate when adding to information

## 2017-12-14 ENCOUNTER — Inpatient Hospital Stay (HOSPITAL_COMMUNITY): Payer: Medicare Other

## 2017-12-14 ENCOUNTER — Encounter (HOSPITAL_COMMUNITY): Payer: Self-pay | Admitting: Primary Care

## 2017-12-14 DIAGNOSIS — Z7189 Other specified counseling: Secondary | ICD-10-CM

## 2017-12-14 DIAGNOSIS — S06340D Traumatic hemorrhage of right cerebrum without loss of consciousness, subsequent encounter: Secondary | ICD-10-CM

## 2017-12-14 DIAGNOSIS — Z515 Encounter for palliative care: Secondary | ICD-10-CM

## 2017-12-14 LAB — BASIC METABOLIC PANEL
ANION GAP: 6 (ref 5–15)
BUN: 26 mg/dL — ABNORMAL HIGH (ref 8–23)
CHLORIDE: 108 mmol/L (ref 98–111)
CO2: 27 mmol/L (ref 22–32)
Calcium: 8.7 mg/dL — ABNORMAL LOW (ref 8.9–10.3)
Creatinine, Ser: 1.66 mg/dL — ABNORMAL HIGH (ref 0.61–1.24)
GFR, EST AFRICAN AMERICAN: 41 mL/min — AB (ref >60)
GFR, EST NON AFRICAN AMERICAN: 35 mL/min — AB (ref >60)
Glucose, Bld: 112 mg/dL — ABNORMAL HIGH (ref 70–99)
POTASSIUM: 3.5 mmol/L (ref 3.5–5.1)
SODIUM: 141 mmol/L (ref 135–145)

## 2017-12-14 LAB — CBC
HCT: 29.4 % — ABNORMAL LOW (ref 39.0–52.0)
HEMOGLOBIN: 9 g/dL — AB (ref 13.0–17.0)
MCH: 27.1 pg (ref 26.0–34.0)
MCHC: 30.6 g/dL (ref 30.0–36.0)
MCV: 88.6 fL (ref 80.0–100.0)
NRBC: 0 % (ref 0.0–0.2)
Platelets: 180 10*3/uL (ref 150–400)
RBC: 3.32 MIL/uL — ABNORMAL LOW (ref 4.22–5.81)
RDW: 15.9 % — ABNORMAL HIGH (ref 11.5–15.5)
WBC: 5.7 10*3/uL (ref 4.0–10.5)

## 2017-12-14 MED ORDER — MORPHINE SULFATE (PF) 2 MG/ML IV SOLN
2.0000 mg | INTRAVENOUS | Status: DC | PRN
  Administered 2017-12-14 – 2017-12-16 (×9): 2 mg via INTRAVENOUS
  Filled 2017-12-14 (×9): qty 1

## 2017-12-14 MED ORDER — LORAZEPAM 2 MG/ML IJ SOLN
1.0000 mg | INTRAMUSCULAR | Status: DC | PRN
  Administered 2017-12-14 – 2017-12-16 (×7): 1 mg via INTRAVENOUS
  Filled 2017-12-14 (×7): qty 1

## 2017-12-14 NOTE — Progress Notes (Signed)
Palliative Medicine consult noted. Due to high referral volume, there may be a delay seeing this patient. Please call the Palliative Medicine Team office at [phone] if recommendations are needed in the interim.  Thank you for inviting [place] to see this patient.  Marjie Skiff Sheneika Walstad, RN, BSN, [facility] Palliative Medicine Team 12/14/2017 2:49 PM Office [phone]

## 2017-12-14 NOTE — Progress Notes (Signed)
Per Md pt does not need swallow evaluation, refused surgical intervention so possibly  comfort care ,diet change to regular and pt tolerated po medication/ thin liquid with no problem. Home cookie and jello given by care giver with no cough nor s/s of aspiration. RN Will continue to monitor Pt.

## 2017-12-14 NOTE — Consult Note (Signed)
Consultation Note Date: 12/14/2017   Patient Name: Jonathan Acevedo  DOB: 10-Jul-1928  MRN: [id number]  Age / Sex: 84 y.o., male  PCP: Lawerance Cruel, MD Referring Physician: Cherene Altes, MD  Reason for Consultation: Establishing goals of care and Psychosocial/spiritual support  HPI/Patient Profile: 84 y.o. male  with past medical history of A. fib, aortic stenosis, heart failure, coronary artery disease remote MI in 1998, GERD, high blood pressure and cholesterol, right inguinal hernia, renal insufficiency, kidney stone history, history of recent falls admitted on 12/13/2017 with subacute moderately large right subdural hematoma and chronic left subdural hematoma.   Clinical Assessment and Goals of Care: Jonathan Acevedo is resting quietly in bed.  His friend/caregiver, Katharine Look is at bedside.  He will turn his head in my direction, but not open his eyes when I asked him to look at me.  He appears relatively comfortable, but has his arm up, holding his head.  He tells me that he has no complaints, but is tired.  We lower the head of his bed, and I encouraged him to lower his arm.  Nursing staff comes in to give some medications for pain.  He is relaxed and comfortable as I leave.    Conversation with friend/caregiver Katharine Look.  We talked about comfort measures, 48 to 72 hours for outcomes, transition to [facility] [place] [facility] if appropriate.  All questions answered, support given.  Einar Gip states that she will reach out to healthcare power of attorney, Dr. Kerri Perches, he may come to see Jonathan Acevedo tomorrow.  Conference with nursing staff related to goals of care discussion, symptom management.  HCPOA  HCPOA - legal paperwork noted in epic naming Dr. Mauro Kaufmann as healthcare surrogate.  Contact information noted in facesheet.   SUMMARY OF RECOMMENDATIONS   Full comfort care at this time.   48 to 72 hours for  outcomes/disposition.  Code Status/Advance Care Planning:  DNR  Symptom Management:   Per hospitalist, no additional needs at this time.  Palliative Prophylaxis:   Aspiration, Frequent Pain Assessment and Turn Reposition  Additional Recommendations (Limitations, Scope, Preferences):  Full Comfort Care  Psycho-social/Spiritual:   Desire for further Chaplaincy support:no  Additional Recommendations: Caregiving  Support/Resources and Education on Hospice  Prognosis:   Unable to determine, based on outcomes.  The next 2 to 3 days will assist in determining prognosis.  Discharge Planning: To be determined, based on outcomes.      Primary Diagnoses: Present on Admission: . Subdural hematoma ([place]) . ICH (intracerebral hemorrhage) ([place])   I have reviewed the medical record, interviewed the patient and family, and examined the patient. The following aspects are pertinent.  Past Medical History:  Diagnosis Date  . Afib ([place])   . Aortic stenosis   . Arthritis   . Cataract   . CHF (congestive heart failure) ([place])   . Coronary artery disease    w remote anterior myocardial infarction (1998)  . GERD (gastroesophageal reflux disease)   . Hypercalcemia 12/26/2011  . Hypercholesteremia   .  Hypertension   . Inguinal hernia    right  . Kidney stones   . Myocardial infarct, old    3 times   . 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 Rectal polyp   . Renal insufficiency   . Syncope 05/18/2013  . Wears glasses    Social History   Socioeconomic History  . Marital status: Married    Spouse name: Not on file  . Number of children: 0  . Years of education: Not on file  . Highest education level: Not on file  Occupational History  . 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 Financial resource strain: Not on file  . Food insecurity:    Worry: Not on file    Inability: Not on file  . Transportation needs:    Medical: Not on file     Non-medical: Not on file  Tobacco Use  . Smoking status: Former Smoker    Packs/day: 1.00    Years: 45.00    Pack years: 45.00    Types: Cigarettes    Last attempt to quit: 07/19/1996    Years since quitting: 21.4  . Smokeless tobacco: Never Used  Substance and Sexual Activity  . Alcohol use: No  . Drug use: No  . Sexual activity: Not on file  Lifestyle  . Physical activity:    Days per week: Not on file    Minutes per session: Not on file  . Stress: Not on file  Relationships  . Social connections:    Talks on phone: Not on file    Gets together: Not on file    Attends religious service: Not on file    Active member of club or organization: Not on file    Attends meetings of clubs or organizations: Not on file    Relationship status: Not on file  Other Topics Concern  . Not on file  Social History Narrative   Lives with wife, generally exercises regularly.   Denies abuse and feels safe at home.    History reviewed. No pertinent family history. Scheduled Meds: . sodium chloride flush  3 mL Intravenous Q12H   Continuous Infusions: . sodium chloride     PRN Meds:.sodium chloride, acetaminophen **OR** acetaminophen, LORazepam, morphine injection, ondansetron **OR** ondansetron (ZOFRAN) IV, polyethylene glycol, sodium chloride flush Medications Prior to Admission:  Prior to Admission medications   Medication Sig Start Date End Date Taking? Authorizing Provider  Acetaminophen (TYLENOL ARTHRITIS PAIN PO) Take by mouth as directed.    [provider]  aspirin 81 MG chewable tablet Chew 81 mg by mouth daily.     [provider]  atorvastatin (LIPITOR) 40 MG tablet TAKE 1 TABLET BY MOUTH  DAILY 11/05/17   [place], Peter M, MD  calcium-vitamin D (OSCAL WITH D) 500-200 MG-UNIT tablet Take 1 tablet by mouth daily with breakfast.    [provider]  furosemide (LASIX) 40 MG tablet TAKE 1 TABLET BY MOUTH IN  THE MORNING AND 1/2 TABLET  IN THE EVENING MAY TAKE  ONE MORE TABLET FOR WEIGHT GAIN OR INCREASE SWELLING 09/03/17   [place], Peter M, MD  magnesium oxide (MAG-OX) 400 MG tablet Take 400-800 mg by mouth at bedtime as needed (leg cramps).  07/22/17   [provider]  nitroGLYCERIN (NITROSTAT) 0.4 MG SL tablet Place 1 tablet (0.4 mg total) under the tongue every 5 (five) minutes as needed for chest pain. 02/23/17   [place], Peter  M, MD  potassium chloride SA (K-DUR,KLOR-CON) 20 MEQ tablet TAKE 1 TABLET BY MOUTH  DAILY 06/27/17   [place], Peter M, MD  vitamin B-12 (CYANOCOBALAMIN) 1000 MCG tablet Take 1,000 mcg by mouth every morning.    [provider]   Allergies  Allergen Reactions  . Ibandronic Acid Other (See Comments)    Muscle Aches-pt denies  . Risedronate Sodium Other (See Comments)    Muscle aches  . Flomax [Tamsulosin Hcl] Other (See Comments)    Syncope    Review of Systems  Unable to perform ROS: Acuity of condition    Physical Exam  Constitutional: No distress.  Appears acutely/chronically ill.  HENT:  Head: Atraumatic.  Some temporal wasting  Cardiovascular: Normal rate.  Pulmonary/Chest: Effort normal. No respiratory distress.  Abdominal: Soft. He exhibits no distension. There is no guarding.  Musculoskeletal: He exhibits no edema.  Neurological: He is alert.  Oriented to person only, keeps eyes closed  Skin: Skin is warm and dry.  Psychiatric:  Cooperative, but confused  Nursing note and vitals reviewed.   Vital Signs: BP 119/82 (BP Location: Right Arm)   Pulse 61   Temp 97.6 F (36.4 C) (Oral)   Resp 20   Ht 5\' 10"  (1.778 m)   Wt 82.8 kg   SpO2 97%   BMI 26.19 kg/m  Pain Scale: 0-10 POSS *See Group Information*: S-Acceptable,Sleep, easy to arouse Pain Score: Asleep   SpO2: SpO2: 97 % O2 Device:SpO2: 97 % O2 Flow Rate: .   IO: Intake/output summary:   Intake/Output Summary (Last 24 hours) at 12/14/2017 1550 Last data filed at 12/14/2017 1058 Gross per 24 hour  Intake 939.54 ml    Output 0 ml  Net 939.54 ml    LBM: Last BM Date: (unknown) Baseline Weight: Weight: 82.8 kg Most recent weight: Weight: 82.8 kg     Palliative Assessment/Data:   Flowsheet Rows     Most Recent Value  Intake Tab  Referral Department  Hospitalist  Unit at Time of Referral  Med/Surg Unit  Palliative Care Primary Diagnosis  Neurology  Date Notified  12/13/17  Palliative Care Type  New Palliative care  Reason for referral  Clarify Goals of Care  Date of Admission  12/13/17  Date first seen by Palliative Care  12/14/17  # of days Palliative referral response time  1 Day(s)  # of days IP prior to Palliative referral  0  Clinical Assessment  Palliative Performance Scale Score  20%  Pain Max last 24 hours  Not able to report  Pain Min Last 24 hours  Not able to report  Dyspnea Max Last 24 Hours  Not able to report  Dyspnea Min Last 24 hours  Not able to report  Psychosocial & Spiritual Assessment  Palliative Care Outcomes  Patient/Family meeting held?  Yes  Palliative Care Outcomes  Clarified goals of care, Counseled regarding hospice, Provided psychosocial or spiritual support  Patient/Family wishes: Interventions discontinued/not started   Mechanical Ventilation      Time In: 1610 Time Out: 1650 Time Total: 40 minutes Greater than 50%  of this time was spent counseling and coordinating care related to the above assessment and plan.  Signed by: Drue Novel, NP   Please contact Palliative Medicine Team phone at [phone] for questions and concerns.  For individual provider: See Shea Evans

## 2017-12-14 NOTE — Care Management Note (Signed)
Case Management Note  Patient Details  Name: Jonathan Acevedo MRN: [id number] Date of Birth: [birth year]  Subjective/Objective:    Pt in with subdural hematoma. He is from home alone.  Pt has caregiver: Jonathan Acevedo-- 3 times a week for 4-6 hours on these days. Caregiver denies issues with pt obtaining his medications.  Pt was driving prior to admission.  DME; cane, walker, BSC, shower chair,                 Action/Plan: Per caregiver, plan is to have palliative see patient to see the best d/c disposition. CM following.   Expected Discharge Date:                  Expected Discharge Plan:  [place]  In-House Referral:     Discharge planning Services     Post Acute Care Choice:    Choice offered to:     DME Arranged:    DME Agency:     HH Arranged:    [place] Agency:     Status of Service:  In process, will continue to follow  If discussed at Long Length of Stay Meetings, dates discussed:    Additional Comments:  Pollie Friar, RN 12/14/2017, 12:36 PM

## 2017-12-14 NOTE — Progress Notes (Signed)
OT Cancellation Note  Patient Details Name: Jonathan Acevedo MRN: [id number] DOB: 1928/09/14   Cancelled Treatment:    Reason Eval/Treat Not Completed: Other (comment). Spoke with pt's RN, Havy and pt's friend/caregiver in room does not want pt to have any therapy while here. POA has not made it clear yet as to what he wants for pt. Waiting on MD to talk to them to determine evaluation or not.  [place], OTR/L Acute Rehab Services Pager [phone] Office [phone]     Almon Register 12/14/2017, 11:52 AM

## 2017-12-14 NOTE — Progress Notes (Signed)
PT Cancellation Note  Patient Details Name: Jonathan Acevedo MRN: [id number] DOB: [birth year]   Cancelled Treatment:    Reason Eval/Treat Not Completed: Other (comment)  Spoke with pt's RN, Havy and pt's friend/caregiver in room does not want pt to have any therapy while here. POA has not made it clear yet as to what he wants for pt. Waiting on MD to talk to them to determine evaluation or not. 12/14/2017  Donnella Sham, PT Acute Rehabilitation Services [phone]  (pager) [phone]  (office)   Tessie Fass Jonella Redditt 12/14/2017, 12:03 PM

## 2017-12-14 NOTE — Progress Notes (Signed)
Repeat CT head completed: IMPRESSION: 1. Unchanged size of right subdural hematoma and slight decrease in size of left subdural hematoma. 2. Development of internal fluid-fluid level on the right, with separation of the more dense and more recent blood products from the less recent. 3. Improved leftward midline shift.  Recommendations:  1. Continue to monitor patient 2. May need palliative care consult in the AM.   Electronically signed: Dr. Kerney Elbe

## 2017-12-14 NOTE — Progress Notes (Signed)
Pt attempting to remove TELE monitoring at tis time. It is current on standby. Will discuss with MD on round.  Ave Filter, RN

## 2017-12-14 NOTE — Progress Notes (Signed)
SLP Cancellation Note  Patient Details Name: Jonathan Acevedo MRN: [id number] DOB: 1928-09-07   Cancelled treatment:       Reason Eval/Treat Not Completed: Other (comment)(per notes, pt does not need swallow eval, palliative referral )  Please reorder swallow evaluation if desired. Luanna Salk, MS [facility] SLP Acute Rehab Services Pager [phone] Office [phone]    Macario Golds 12/14/2017, 10:25 AM

## 2017-12-14 NOTE — Progress Notes (Addendum)
Patient's friend, Carmie End (whom introduced herself as a hospice nurse), at bedside at this time requesting for pt to have IVF, TELE, VS, neuro checks and assessments and so forth discontinue. She verbalized that the doctor from last night already established that pt's agreed to transitioned to hospice/palliave. She also request to speak with attending MD. MD paged.    POC: Continue to have patient be as comfortable per pt/family request.

## 2017-12-14 NOTE — Progress Notes (Signed)
Patient caregiver at bedside request for pt to have "restless" medication but pt appears to be resting with eyes closed quietly when RN made rounds. Pt caregiver also resting in recliner in room with eye closed quietly.    Ave Filter, RN

## 2017-12-14 NOTE — Progress Notes (Signed)
PT Cancellation Note  Patient Details Name: SHMUEL GIRGIS MRN: [id number] DOB: 1929/01/08   Cancelled Treatment:        Tessie Fass Trapper Meech 12/14/2017, 12:02 PM

## 2017-12-14 NOTE — Progress Notes (Signed)
Jonathan TEAM 1 - Stepdown/ICU TEAM  Jonathan Acevedo  VQQ:[id number] DOB: 1928/12/19 DOA: 12/13/2017 PCP: Jonathan Cruel, MD    Brief Narrative:  84yo M w/ a hx of CAD with remote MI and prior stent to the LAD, HTN, CKD, severe AoS (refused TAVR), recurrent falls/syncopal spells, and HLD who was transferred from Jonathan Acevedo to Jonathan Acevedo due to recurrent falls, confusion, and an unsteady gait. Over the last few weeks his Jonathan Acevedo had noted a progressive decline in his ability to attend to his own ADLs.   In ED a CT head showed a moderately large right subdural hematoma with intermediate density and a few small areas of higher attenuation indicating some active bleeding, as well as an old left subdural fluid collection of 8 mm in diameter. NS was consulted, but the patient refused intervention.  Subjective: The pt is unresponsive, but does not appear uncomfortable. There is no evidence of acute resp distress or uncontrolled pain. I spoke at length w/ his Jonathan at the bedside, who was able to relay to me the thoughts of the patient's Jonathan Acevedo.   Assessment & Plan:  Subacute moderately large right subdural hematoma + chronic L SDH Jonathan Acevedo reports via the Jonathan that the pt would not desire aggressive intervention - we have transitioned to full comfort focused care - it is felt that 48-72hrs will be required to determine the prognosis for Jonathan Acevedo - we should be better able to make a prediction of his prognosis w/ more time   Chronic systolic HF - EF 43-32%  CAD  Severe Aortic stenosis  CKD - stage 3  Recurrent Falls  Situational depression/anxiety  DVT prophylaxis: SCDs Code Status: FULL CODE Family Communication: spoke w/ Jonathan at bedside   Disposition Plan: comfort focused care - allow 48-72hrs for prognosis to become more clear - potential options include hospital death, Optometrist transfer, or home w/ Hospice care  Consultants:   Neurology Neurosurgery   Antimicrobials:  none  Objective: Blood pressure 119/82, pulse 61, temperature 97.6 F (36.4 C), temperature source Oral, resp. rate 20, height 5\' 10"  (1.778 m), weight 82.8 kg, SpO2 97 %.  Intake/Output Summary (Last 24 hours) at 12/14/2017 1209 Last data filed at 12/14/2017 1058 Gross per 24 hour  Intake 939.54 ml  Output 0 ml  Net 939.54 ml   Filed Weights   12/13/17 1323  Weight: 82.8 kg    Examination: No evidence of uncontrolled pain. Respirations calm and unlabored.   CBC: Recent Labs  Lab 12/13/17 1453 12/14/17 0336  WBC 6.3 5.7  NEUTROABS 4.3  --   HGB 10.1* 9.0*  HCT 31.8* 29.4*  MCV 89.1 88.6  PLT 192 951   Basic Metabolic Panel: Recent Labs  Lab 12/13/17 1453 12/14/17 0336  NA 139 141  K 3.6 3.5  CL 102 108  CO2 26 27  GLUCOSE 108* 112*  BUN 33* 26*  CREATININE 1.63* 1.66*  CALCIUM 9.0 8.7*  MG 2.7*  --    GFR: Estimated Creatinine Clearance: 32.4 mL/min (A) (by C-G formula based on SCr of 1.66 mg/dL (H)).  Liver Function Tests: Recent Labs  Lab 12/13/17 1453  AST 27  ALT 16  ALKPHOS 105  BILITOT 1.2  PROT 6.9  ALBUMIN 3.5    Coagulation Profile: Recent Labs  Lab 12/13/17 1453  INR 1.29    Cardiac Enzymes: Recent Labs  Lab 12/13/17 1453  TROPONINI 0.05*    HbA1C: Hgb A1c MFr Bld  Date/Time Value Ref Range Status  11/16/2012 07:32 PM 6.2 (H) <5.7 % Final    Comment:    (NOTE)                                                                       According to the ADA Clinical Practice Recommendations for 2011, when HbA1c is used as a screening test:  >=6.5%   Diagnostic of Diabetes Mellitus           (if abnormal result is confirmed) 5.7-6.4%   Increased risk of developing Diabetes Mellitus References:Diagnosis and Classification of Diabetes Mellitus,Diabetes PJSR,1594,58(PFYTW 1):S62-S69 and Standards of Medical Care in         Diabetes - 2011,Diabetes KMQK,8638,17 (Suppl 1):S11-S61.     Scheduled Meds: . atorvastatin  40 mg Oral Daily  . calcium-vitamin D  1 tablet Oral Q breakfast  . dexamethasone  10 mg Intravenous Once  . potassium chloride SA  20 mEq Oral Daily  . senna-docusate  2 tablet Oral QHS  . sodium chloride flush  3 mL Intravenous Q12H  . traZODone  50 mg Oral QHS  . vitamin B-12  1,000 mcg Oral Daily     LOS: 1 day   Cherene Altes, MD [facility] Office  [phone] Pager - Text Page per Amion  If 7PM-7AM, please contact night-coverage per Amion 12/14/2017, 12:09 PM

## 2017-12-15 DIAGNOSIS — Z7189 Other specified counseling: Secondary | ICD-10-CM

## 2017-12-15 NOTE — Progress Notes (Signed)
[facility]                                                                              Patient Demographics  Jonathan Acevedo, is a 84 y.o. male, DOB - December 28, 1928, STM:[id number]  Admit date - 12/13/2017   Admitting Physician Merton Border, MD  Outpatient Primary MD for the patient is Lawerance Cruel, MD  Outpatient specialists:   LOS - 2  days   Medical records reviewed and are as summarized below:    Chief Complaint  Patient presents with  . Leg Swelling       Brief summary   84yo M w/ a hx of CAD with remote MI and prior stent to the LAD, HTN, CKD, severeAoS (refused TAVR), recurrent falls/syncopal spells, and HLDwho was transferred from [place] to [facility] due to recurrent falls,confusion, and an unsteady gait. Over the last few weeks his caregiver Ms. Carmie End had noted a progressive decline in his ability to attend to his own ADLs.  In ED a CT head showed a moderately large right subdural hematoma with intermediate density and a few small areas of higher attenuation indicating some active bleeding, as well as an old left subdural fluid collection of 8 mm in diameter. NS was consulted, but the patient refused intervention.   Assessment & Plan     Subacute moderately large right SDH (subdural hematoma) (HCC) with chronic left SDH -CT head showed a moderately large right subdural hematoma with intermediate density, some active bleeding as well as old left subdural fluid collection -Neurosurgery was consulted, Dr. Arnoldo Morale, patient refused surgery.  Patient was seen by neurology, repeat CT head showed unchanged size of right subdural hematoma and slight decrease in left subdural hematoma, development of internal fluid- fluid level on the right. Patient's caregiver had reported progressive decline in the patient in the last few weeks.  Palliative medicine consult was obtained, respecting the patient's wishes, patient was transitioned to comfort care  status, DNR. - Social work consulted for possible residential hospice   Chronic systolic CHF, severe aortic stenosis, CAD -EF 25 to 30%, comfort care status   Chronic kidney disease stage III -Continue comfort care status  Recurrent falls, failure to thrive -Social work consulted for residential hospice, comfort care status  Code Status: DNR DVT Prophylaxis: SCDs Family Communication: No family member at the bedside   Disposition Plan: Possible residential hospice when bed available   Time Spent in minutes 25 minutes  Procedures:  None  Consultants:   Neurology Neurosurgery  Antimicrobials:      Medications  Scheduled Meds: . sodium chloride flush  3 mL Intravenous Q12H   Continuous Infusions: . sodium chloride     PRN Meds:.sodium chloride, acetaminophen **OR** acetaminophen, LORazepam, morphine injection, ondansetron **OR** ondansetron (ZOFRAN) IV, polyethylene glycol, sodium chloride flush   Antibiotics   Anti-infectives (From admission, onward)   None        Subjective:   [name] was seen and examined today.  Awake and coughing but does not appear to be uncomfortable.  No fevers or chills.  No vomiting.   Objective:   Vitals:  12/13/17 1944 12/14/17 0100 12/14/17 0334 12/14/17 0738  BP: 113/73 106/68 105/65 119/82  Pulse: 68 (!) 57 (!) 55 61  Resp: 17 16 17 20   Temp:  (!) 97.5 F (36.4 C) 97.7 F (36.5 C) 97.6 F (36.4 C)  TempSrc:  Axillary  Oral  SpO2: 93% 90% 90% 97%  Weight:      Height:        Intake/Output Summary (Last 24 hours) at 12/15/2017 1038 Last data filed at 12/15/2017 0800 Gross per 24 hour  Intake 579.54 ml  Output -  Net 579.54 ml     Wt Readings from Last 3 Encounters:  12/13/17 82.8 kg  12/10/17 82.8 kg  08/07/17 78.5 kg     Exam  General: Awake, opens eyes, coughing but does not appear to be uncomfortable  Eyes:   HEENT:    Cardiovascular: S1 S2 auscultated, + 3/6 systolic murmur RUSB,  RRR  Respiratory:decreased breath sound at the base  Gastrointestinal: Soft, nontender, nondistended, + bowel sounds  Ext: no pedal edema bilaterally  Neuro: did not assess  Musculoskeletal:  Skin:   Psych:    Data Reviewed:  I have personally reviewed following labs and imaging studies  Micro Results No results found for this or any previous visit (from the past 240 hour(s)).  Radiology Reports Dg Chest 2 View  Result Date: 12/13/2017 CLINICAL DATA:  Chest pain, lower extremity swelling for 2 months. EXAM: CHEST - 2 VIEW COMPARISON:  Chest radiograph October 30, 2017 FINDINGS: Stable cardiomegaly. Mildly calcified aortic arch. Similar fullness of the hila most compatible with vascular shadows. Mild chronic interstitial changes without pleural effusion or focal consolidation. No pneumothorax though the LEFT lung apex is obscured by facial structures. Soft tissue planes and included osseous structures are non suspicious. Loop monitor projects over LEFT chest. IMPRESSION: 1. Stable cardiomegaly. 2. Stable chronic interstitial changes/COPD. 3. Stable fullness of the hila most compatible with vascular shadows. Electronically Signed   By: Elon Alas M.D.   On: 12/13/2017 14:29   Ct Head Wo Contrast  Result Date: 12/14/2017 CLINICAL DATA:  Follow-up of intracranial hemorrhage. EXAM: CT HEAD WITHOUT CONTRAST TECHNIQUE: Contiguous axial images were obtained from the base of the skull through the vertex without intravenous contrast. COMPARISON:  Head CT 12/13/2017 FINDINGS: Brain: Unchanged size of right convexity mixed density subdural hematoma. There is an internal hematocrit level. Thickness measures 23 mm, unchanged. Left convexity hypoattenuating hematoma measures 4 mm, previously 6 mm. Midline shift measures 4 mm to the left, previously 7 mm. Unchanged size and configuration of the lateral ventricles. The brain parenchyma is normal, without evidence of acute or chronic infarction.  Vascular: No abnormal hyperdensity of the major intracranial arteries or dural venous sinuses. No intracranial atherosclerosis. Skull: The visualized skull base, calvarium and extracranial soft tissues are normal. Sinuses/Orbits: No fluid levels or advanced mucosal thickening of the visualized paranasal sinuses. No mastoid or middle ear effusion. The orbits are normal. IMPRESSION: 1. Unchanged size of right subdural hematoma and slight decrease in size of left subdural hematoma. 2. Development of internal fluid-fluid level on the right, with separation of the more dense and more recent blood products from the less recent. 3. Improved leftward midline shift. Electronically Signed   By: Ulyses Jarred M.D.   On: 12/14/2017 01:07   Ct Head Wo Contrast  Result Date: 12/13/2017 CLINICAL DATA:  Altered level of consciousness, fell several weeks ago hitting the head EXAM: CT HEAD WITHOUT CONTRAST TECHNIQUE: Contiguous axial images  were obtained from the base of the skull through the vertex without intravenous contrast. COMPARISON:  None. FINDINGS: Brain: There is a moderately large subacute right subdural hematoma with intermediate density and some areas of higher attenuation indicating small focus of acute bleeding with layering posteriorly. On image number 21 series 2 this right subacute subdural hematoma has a depth of 2.2 cm. Frontally there is a component of this right subdural hematoma measuring 1.9 cm. The midline septum is post to the left by 7 mm. On the left there is an old left subdural fluid collection without current evidence of mass effect. This left-sided old subdural measures 8 mm in diameter. The ventricular system is slightly prominent. Only mild small vessel ischemic change is noted. No other area of hemorrhage, mass lesion, or other acute abnormality is seen. Vascular: No vascular abnormality is seen on this unenhanced study. Skull: On bone window images, no acute calvarial abnormality is seen.  Sinuses/Orbits: The paranasal sinuses that are visualized appear well pneumatized. Other: None. IMPRESSION: 1. Subacute moderately large right subdural hematoma with intermediate density and a few small areas of higher attenuation indicating some active bleeding. This right subdural hematoma measures 2.2 cm in depth. 2. Displacement of the midline septum to the left by 7 mm. 3. Old left subdural fluid collection of 8 mm in diameter. 4. Atrophy and mild small vessel ischemic change. Electronically Signed   By: Ivar Drape M.D.   On: 12/13/2017 14:28   US Venous Img Lower Right (dvt Study)  Result Date: 12/13/2017 CLINICAL DATA:  Right lower extremity pain and edema for the past 2 months. Evaluate for DVT. EXAM: RIGHT LOWER EXTREMITY VENOUS DOPPLER ULTRASOUND TECHNIQUE: Gray-scale sonography with graded compression, as well as color Doppler and duplex ultrasound were performed to evaluate the lower extremity deep venous systems from the level of the common femoral vein and including the common femoral, femoral, profunda femoral, popliteal and calf veins including the posterior tibial, peroneal and gastrocnemius veins when visible. The superficial great saphenous vein was also interrogated. Spectral Doppler was utilized to evaluate flow at rest and with distal augmentation maneuvers in the common femoral, femoral and popliteal veins. COMPARISON:  None. FINDINGS: Contralateral Common Femoral Vein: Respiratory phasicity is normal and symmetric with the symptomatic side. No evidence of thrombus. Normal compressibility. Common Femoral Vein: No evidence of thrombus. Normal compressibility, respiratory phasicity and response to augmentation. Saphenofemoral Junction: No evidence of thrombus. Normal compressibility and flow on color Doppler imaging. Profunda Femoral Vein: No evidence of thrombus. Normal compressibility and flow on color Doppler imaging. Femoral Vein: No evidence of thrombus. Normal compressibility,  respiratory phasicity and response to augmentation. Popliteal Vein: No evidence of thrombus. Normal compressibility, respiratory phasicity and response to augmentation. Calf Veins: No evidence of thrombus. Normal compressibility and flow on color Doppler imaging. Superficial Great Saphenous Vein: No evidence of thrombus. Normal compressibility. Venous Reflux:  None. Other Findings:  None. IMPRESSION: No evidence of DVT within the right lower extremity. Electronically Signed   By: Sandi Mariscal M.D.   On: 12/13/2017 14:53    Lab Data:  CBC: Recent Labs  Lab 12/13/17 1453 12/14/17 0336  WBC 6.3 5.7  NEUTROABS 4.3  --   HGB 10.1* 9.0*  HCT 31.8* 29.4*  MCV 89.1 88.6  PLT 192 086   Basic Metabolic Panel: Recent Labs  Lab 12/13/17 1453 12/14/17 0336  NA 139 141  K 3.6 3.5  CL 102 108  CO2 26 27  GLUCOSE 108* 112*  BUN 33* 26*  CREATININE 1.63* 1.66*  CALCIUM 9.0 8.7*  MG 2.7*  --    GFR: Estimated Creatinine Clearance: 32.4 mL/min (A) (by C-G formula based on SCr of 1.66 mg/dL (H)). Liver Function Tests: Recent Labs  Lab 12/13/17 1453  AST 27  ALT 16  ALKPHOS 105  BILITOT 1.2  PROT 6.9  ALBUMIN 3.5   No results for input(s): LIPASE, AMYLASE in the last 168 hours. No results for input(s): AMMONIA in the last 168 hours. Coagulation Profile: Recent Labs  Lab 12/13/17 1453  INR 1.29   Cardiac Enzymes: Recent Labs  Lab 12/13/17 1453  TROPONINI 0.05*   BNP (last 3 results) No results for input(s): PROBNP in the last 8760 hours. HbA1C: No results for input(s): HGBA1C in the last 72 hours. CBG: No results for input(s): GLUCAP in the last 168 hours. Lipid Profile: No results for input(s): CHOL, HDL, LDLCALC, TRIG, CHOLHDL, LDLDIRECT in the last 72 hours. Thyroid Function Tests: No results for input(s): TSH, T4TOTAL, FREET4, T3FREE, THYROIDAB in the last 72 hours. Anemia Panel: No results for input(s): VITAMINB12, FOLATE, FERRITIN, TIBC, IRON, RETICCTPCT in the  last 72 hours. Urine analysis:    Component Value Date/Time   COLORURINE YELLOW 07/30/2017 2155   APPEARANCEUR CLEAR 07/30/2017 2155   LABSPEC 1.016 07/30/2017 2155   PHURINE 6.0 07/30/2017 2155   GLUCOSEU NEGATIVE 07/30/2017 2155   HGBUR SMALL (A) 07/30/2017 2155   BILIRUBINUR NEGATIVE 07/30/2017 2155   KETONESUR NEGATIVE 07/30/2017 2155   PROTEINUR NEGATIVE 07/30/2017 2155   UROBILINOGEN 0.2 05/04/2012 0158   NITRITE NEGATIVE 07/30/2017 2155   LEUKOCYTESUR NEGATIVE 07/30/2017 2155     Corian Handley M.D. [facility] 12/15/2017, 10:38 AM  Pager: [phone] Between 7am to 7pm - call Pager - [phone]  After 7pm go to [email or web address] - password [email or web address]  Call night coverage person covering after 7pm

## 2017-12-15 NOTE — Progress Notes (Signed)
Pharmacist Heart Failure Core Measure Documentation  Assessment: Jonathan Acevedo has an EF documented as 25-30% on 03/17/17 by ECHO.  Rationale: Heart failure patients with left ventricular systolic dysfunction (LVSD) and an EF < 40% should be prescribed an angiotensin converting enzyme inhibitor (ACEI) or angiotensin receptor blocker (ARB) at discharge unless a contraindication is documented in the medical record.  This patient is not currently on an ACEI or ARB for HF. Patient is comfort care.  This note is being placed in the record in order to provide documentation that a contraindication to the use of these agents is present for this encounter.   [place], PharmD PGY1 Pharmacy Resident Phone [phone] 12/15/2017     3:04 PM

## 2017-12-16 MED ORDER — MORPHINE SULFATE (PF) 2 MG/ML IV SOLN: 2.0000 mg | INTRAVENOUS | 0 refills | Status: AC | PRN

## 2017-12-16 MED ORDER — MORPHINE SULFATE (CONCENTRATE) 10 MG/0.5ML PO SOLN
5.0000 mg | Freq: Four times a day (QID) | ORAL | Status: DC
  Administered 2017-12-16: 5 mg via SUBLINGUAL
  Filled 2017-12-16: qty 0.5

## 2017-12-16 MED ORDER — LORAZEPAM 2 MG/ML IJ SOLN: 1.0000 mg | INTRAMUSCULAR | 0 refills | Status: AC | PRN

## 2017-12-16 NOTE — Progress Notes (Addendum)
[facility] [place] 3W-31 -- [facility] ([facility] RN Note for [employer]  Received request from [place], [place] for family interest in [facility]. Chart reviewed and spoke with Molli Hazard (friend and POA) to acknowledge referral.  Unfortunately, [facility] is not able to offer a room today. Family and CSW are aware [facility] liaison will follow up CSW and family tomorrow or sooner if room becomes available.  Please call with any hospice related questions or concerns.   Thank you, Margaretmary Eddy, RN, [facility] Liaison [phone]  [facility] are on AMION

## 2017-12-16 NOTE — Progress Notes (Addendum)
Daily Progress Note   Patient Name: Jonathan Acevedo       Date: 12/16/2017 DOB: 09-17-1928  Age: 84 y.o. MRN#: [id number] Attending Physician: Mendel Corning, MD Primary Care Physician: Lawerance Cruel, MD Admit Date: 12/13/2017  Reason for Consultation/Follow-up: Establishing goals of care  Subjective: Received notice from Olivia Mackie, RN from [facility] that patient's caregivers requesting eval of patient's comfort measures. Noted no availability at [facility]- SW is continuing to discuss disposition with caregivers. Patient is full comfort care. Patient resting comfortably at this time. Discussed with patient's RN- patient wakes intermittently and is restless when needing to void, moans in pain with changing and bathing. RN notes patient's color has changed. Peripheral pulses remain intact, skin warm. He has received: 3mg  lorazepam and 10 mg IV morphine over the last 24 hours. Will schedule pain medication and place foley catheter for comfort.   ROS  Length of Stay: 3  Current Medications: Scheduled Meds:  . sodium chloride flush  3 mL Intravenous Q12H    Continuous Infusions: . sodium chloride      PRN Meds: sodium chloride, acetaminophen **OR** acetaminophen, LORazepam, morphine injection, ondansetron **OR** ondansetron (ZOFRAN) IV, polyethylene glycol, sodium chloride flush  Physical Exam  Constitutional:  frail  Cardiovascular: Normal rate.  Pulmonary/Chest:  RR slightly increased  Abdominal: Soft.  Neurological:  Does not arouse to my voice or touch  Skin: Skin is warm.  Nursing note and vitals reviewed.           Vital Signs: BP 128/85 (BP Location: Right Arm)   Pulse 85   Temp 98.1 F (36.7 C) (Oral)   Resp 20   Ht 5\' 10"  (1.778 m)   Wt 82.8 kg   SpO2 (!) 89%   BMI 26.19  kg/m  SpO2: SpO2: (!) 89 % O2 Device: O2 Device: Room Air O2 Flow Rate:    Intake/output summary: No intake or output data in the 24 hours ending 12/16/17 1211 LBM: Last BM Date: (PTA) Baseline Weight: Weight: 82.8 kg Most recent weight: Weight: 82.8 kg       Palliative Assessment/Data: PPS: 10%    Flowsheet Rows     Most Recent Value  Intake Tab  Referral Department  Hospitalist  Unit at Time of Referral  Med/Surg Unit  Palliative Care Primary Diagnosis  Neurology  Date Notified  12/13/17  Palliative Care Type  New Palliative care  Reason for referral  Clarify Goals of Care  Date of Admission  12/13/17  Date first seen by Palliative Care  12/14/17  # of days Palliative referral response time  1 Day(s)  # of days IP prior to Palliative referral  0  Clinical Assessment  Palliative Performance Scale Score  20%  Pain Max last 24 hours  Not able to report  Pain Min Last 24 hours  Not able to report  Dyspnea Max Last 24 Hours  Not able to report  Dyspnea Min Last 24 hours  Not able to report  Psychosocial & Spiritual Assessment  Palliative Care Outcomes  Patient/Family meeting held?  Yes  Palliative Care Outcomes  Clarified goals of care, Counseled regarding hospice, Provided psychosocial or spiritual support  Patient/Family wishes: Interventions discontinued/not started   Mechanical Ventilation      Patient Active Problem List   Diagnosis Date Noted  . Goals of care, counseling/discussion   . Palliative care by specialist   . Encounter for hospice care discussion   . SDH (subdural hematoma) ([place]) 12/13/2017  . ICH (intracerebral hemorrhage) ([place]) 12/13/2017  . Nuclear sclerotic cataract of right eye 11/07/2016  . Right inguinal hernia 01/06/2015  . Chronic lower back pain 03/04/2014  . History of chronic back pain 10/05/2013  . Acute on chronic diastolic CHF (congestive heart failure), NYHA class 3 ([place]) 08/12/2013  . Chest pain 08/12/2013  . Acute on chronic  systolic congestive heart failure ([place]) 05/25/2013  . Acute on chronic combined systolic and diastolic CHF (congestive heart failure) ([place]) 05/18/2013  . Syncope 05/18/2013  . Varicose veins of lower extremities with other complications 94/49/6759  . Chronic venous insufficiency 07/29/2012  . Edema 07/18/2012  . Venous (peripheral) insufficiency 07/18/2012  . Iron deficiency anemia 03/29/2012  . Postoperative hypothyroidism 03/25/2012  . Tobacco abuse 01/03/2012  . Essential hypertension 01/03/2012  . 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 Osteoporosis 07/21/2011  . Hyperparathyroidism ([place]) 07/21/2011  . Chronic systolic CHF (congestive heart failure) ([place]) 07/03/2011  . Primary hyperparathyroidism ([place]) 07/03/2011  . Aortic valve disorder 06/01/2011  . Hypertrophy of prostate without urinary obstruction and other lower urinary tract symptoms (LUTS) 06/01/2011  . Ischemic cardiomyopathy 11/29/2010  . Myocardial infarction ([place])   . Coronary artery disease   . Hypertension   . Chronic renal insufficiency, stage III (moderate) (HCC)   . Aortic stenosis   . Rectal polyp   . Hypercholesteremia   . Impotence 03/25/2010  . Vitamin D deficiency 03/25/2010  . Hyperlipemia 08/06/2007  . Sensory hearing loss, bilateral 06/19/2007  . Chronic renal insufficiency 11/12/2006  . Hypercalcemia 12/11/2005    Palliative Care Assessment & Plan   Patient Profile: 84 y.o. male  with past medical history of A. fib, aortic stenosis, heart failure, coronary artery disease remote MI in 1998, GERD, high blood pressure and cholesterol, right inguinal hernia, renal insufficiency, kidney stone history, history of recent falls admitted on 12/13/2017 with subacute moderately large right subdural hematoma and chronic left subdural hematoma. Palliative medicine consulted for [place]- transition to full comfort measures only- awaiting residential hospice placement.  Assessment/Recommendations/Plan   Foley catheter  for comfort to decrease need for turning when changing  Scheduled sublingual morphine for pain 5mg  q6hrs, continue prn IV morphine for breakthrough, continue prn lorazepam  Goals of Care and Additional Recommendations:  Limitations on Scope of Treatment: Full Comfort Care  Code Status:  DNR  Prognosis:   <  2 weeks d/t sub acute large subdural hemmorhage, comfort measures only, continued altered mental status, limited to no po intake  Discharge Planning:  Hospice facility vs home with hospice  Care plan was discussed with patient's RN- H[place].  Thank you for allowing the Palliative Medicine Team to assist in the care of this patient.   Time In: 1150 Time Out: 1225 Total Time 35 minutes Prolonged Time Billed no      Greater than 50%  of this time was spent counseling and coordinating care related to the above assessment and plan.  Mariana Kaufman, AGNP-C Palliative Medicine   Please contact Palliative Medicine Team phone at [phone] for questions and concerns.

## 2017-12-16 NOTE — Discharge Summary (Signed)
Physician Discharge Summary   Patient ID: Jonathan Acevedo MRN: [id number] DOB/AGE: 1928-03-20 84 y.o.  Admit date: 12/13/2017 Discharge date: 12/16/2017  Primary Care Physician:  Lawerance Cruel, MD   Recommendations for Outpatient Follow-up:  1. DNR/DNI comfort care 2. Patient accepted to residential hospice in [place]: None  Equipment/Devices:   Discharge Condition: Very poor prognosis CODE STATUS: DNR/DNI Diet recommendation: Comfort food   Discharge Diagnoses:    Subacute moderately large right subdural hematoma Chronic left SDH Chronic systolic CHF, EF 25 to 12% Severe aortic stenosis CAD Chronic kidney disease stage III Recurrent falls Failure to thrive  Consults:   Neurosurgery Neurology Palliative medicine    Allergies:   Allergies  Allergen Reactions  . Ibandronic Acid Other (See Comments)    Muscle Aches-pt denies  . Risedronate Sodium Other (See Comments)    Muscle aches  . Flomax [Tamsulosin Hcl] Other (See Comments)    Syncope      DISCHARGE MEDICATIONS: Allergies as of 12/16/2017      Reactions   Ibandronic Acid Other (See Comments)   Muscle Aches-pt denies   Risedronate Sodium Other (See Comments)   Muscle aches   Flomax [tamsulosin Hcl] Other (See Comments)   Syncope      Medication List    STOP taking these medications   atorvastatin 40 MG tablet Commonly known as:  LIPITOR   furosemide 40 MG tablet Commonly known as:  LASIX   LORazepam 0.5 MG tablet Commonly known as:  ATIVAN Replaced by:  LORazepam 2 MG/ML injection   magnesium oxide 400 MG tablet Commonly known as:  MAG-OX   mupirocin ointment 2 % Commonly known as:  BACTROBAN   nitroGLYCERIN 0.4 MG SL tablet Commonly known as:  NITROSTAT   potassium chloride SA 20 MEQ tablet Commonly known as:  K-DUR,KLOR-CON   traMADol 50 MG tablet Commonly known as:  ULTRAM     TAKE these medications   LORazepam 2 MG/ML injection Commonly known as:   ATIVAN Inject 0.5 mLs (1 mg total) into the vein every 4 (four) hours as needed for anxiety or sedation (agitation/sleep). Replaces:  LORazepam 0.5 MG tablet   morphine 2 MG/ML injection Inject 1 mL (2 mg total) into the vein every hour as needed.        Brief H and P: For complete details please refer to admission H and P, but in brief 84yo M w/ a hxof CAD with remote MI and prior stent to the LAD, HTN, CKD, severeAoS(refused TAVR),recurrent falls/syncopal spells,and [facility],confusion,and anunsteady gait. Over the last few weeks hiscaregiver Ms. Carmie End had noted a progressive decline in his ability to attend to his own ADLs. In EDaCT head showedamoderately large right subdural hematoma with intermediate density and a few small areas of higher attenuation indicating some active bleeding, as well as an old left subdural fluid collection of 8 mm in diameter. NSwas consulted, but thepatientrefusedintervention.  Hospital Course:   Subacute moderately large right SDH (subdural hematoma) (HCC) with chronic left SDH -CT head showed a moderately large right subdural hematoma with intermediate density, some active bleeding as well as old left subdural fluid collection -Neurosurgery was consulted, Dr. Arnoldo Morale, however patient refused surgery.  Patient was seen by neurology, Dr Cheral Marker, repeat CT head showed unchanged size of right subdural hematoma and slight decrease in left subdural hematoma, development of internal fluid- fluid level on the right. Patient's caregiver had  reported progressive decline in the patient in the last few weeks.  Palliative medicine consult was obtained, respecting the patient's wishes, patient was transitioned to comfort care status, DNR. Patient accepted to inpatient residential hospice, overall poor prognosis/terminal.   Chronic systolic CHF, severe aortic stenosis, CAD -EF 25 to  30%, comfort care status   Chronic kidney disease stage III -Continue comfort care status  Recurrent falls, failure to thrive -Social work was consulted for residential hospice, comfort care status   Day of Discharge S: Minimally responsive, appears to be comfortable  BP 128/85 (BP Location: Right Arm)   Pulse 85   Temp 98.1 F (36.7 C) (Oral)   Resp 20   Ht 5\' 10"  (1.778 m)   Wt 82.8 kg   SpO2 (!) 89%   BMI 26.19 kg/m   Physical Exam: General: Minimally responsive, does not follow any verbal commands, comfortable CVS: S1-S2 clear no murmur rubs or gallops Chest: clear to auscultation bilaterally, no wheezing rales or rhonchi Abdomen: soft nontender, nondistended, normal bowel sounds Extremities: no cyanosis, clubbing or edema noted bilaterally Neuro: minimally responsive   The results of significant diagnostics from this hospitalization (including imaging, microbiology, ancillary and laboratory) are listed below for reference.      Procedures/Studies:  Dg Chest 2 View  Result Date: 12/13/2017 CLINICAL DATA:  Chest pain, lower extremity swelling for 2 months. EXAM: CHEST - 2 VIEW COMPARISON:  Chest radiograph October 30, 2017 FINDINGS: Stable cardiomegaly. Mildly calcified aortic arch. Similar fullness of the hila most compatible with vascular shadows. Mild chronic interstitial changes without pleural effusion or focal consolidation. No pneumothorax though the LEFT lung apex is obscured by facial structures. Soft tissue planes and included osseous structures are non suspicious. Loop monitor projects over LEFT chest. IMPRESSION: 1. Stable cardiomegaly. 2. Stable chronic interstitial changes/COPD. 3. Stable fullness of the hila most compatible with vascular shadows. Electronically Signed   By: Elon Alas M.D.   On: 12/13/2017 14:29   Ct Head Wo Contrast  Result Date: 12/14/2017 CLINICAL DATA:  Follow-up of intracranial hemorrhage. EXAM: CT HEAD WITHOUT CONTRAST  TECHNIQUE: Contiguous axial images were obtained from the base of the skull through the vertex without intravenous contrast. COMPARISON:  Head CT 12/13/2017 FINDINGS: Brain: Unchanged size of right convexity mixed density subdural hematoma. There is an internal hematocrit level. Thickness measures 23 mm, unchanged. Left convexity hypoattenuating hematoma measures 4 mm, previously 6 mm. Midline shift measures 4 mm to the left, previously 7 mm. Unchanged size and configuration of the lateral ventricles. The brain parenchyma is normal, without evidence of acute or chronic infarction. Vascular: No abnormal hyperdensity of the major intracranial arteries or dural venous sinuses. No intracranial atherosclerosis. Skull: The visualized skull base, calvarium and extracranial soft tissues are normal. Sinuses/Orbits: No fluid levels or advanced mucosal thickening of the visualized paranasal sinuses. No mastoid or middle ear effusion. The orbits are normal. IMPRESSION: 1. Unchanged size of right subdural hematoma and slight decrease in size of left subdural hematoma. 2. Development of internal fluid-fluid level on the right, with separation of the more dense and more recent blood products from the less recent. 3. Improved leftward midline shift. Electronically Signed   By: Ulyses Jarred M.D.   On: 12/14/2017 01:07   Ct Head Wo Contrast  Result Date: 12/13/2017 CLINICAL DATA:  Altered level of consciousness, fell several weeks ago hitting the head EXAM: CT HEAD WITHOUT CONTRAST TECHNIQUE: Contiguous axial images were obtained from the base of the skull through the  vertex without intravenous contrast. COMPARISON:  None. FINDINGS: Brain: There is a moderately large subacute right subdural hematoma with intermediate density and some areas of higher attenuation indicating small focus of acute bleeding with layering posteriorly. On image number 21 series 2 this right subacute subdural hematoma has a depth of 2.2 cm. Frontally  there is a component of this right subdural hematoma measuring 1.9 cm. The midline septum is post to the left by 7 mm. On the left there is an old left subdural fluid collection without current evidence of mass effect. This left-sided old subdural measures 8 mm in diameter. The ventricular system is slightly prominent. Only mild small vessel ischemic change is noted. No other area of hemorrhage, mass lesion, or other acute abnormality is seen. Vascular: No vascular abnormality is seen on this unenhanced study. Skull: On bone window images, no acute calvarial abnormality is seen. Sinuses/Orbits: The paranasal sinuses that are visualized appear well pneumatized. Other: None. IMPRESSION: 1. Subacute moderately large right subdural hematoma with intermediate density and a few small areas of higher attenuation indicating some active bleeding. This right subdural hematoma measures 2.2 cm in depth. 2. Displacement of the midline septum to the left by 7 mm. 3. Old left subdural fluid collection of 8 mm in diameter. 4. Atrophy and mild small vessel ischemic change. Electronically Signed   By: Ivar Drape M.D.   On: 12/13/2017 14:28   US Venous Img Lower Right (dvt Study)  Result Date: 12/13/2017 CLINICAL DATA:  Right lower extremity pain and edema for the past 2 months. Evaluate for DVT. EXAM: RIGHT LOWER EXTREMITY VENOUS DOPPLER ULTRASOUND TECHNIQUE: Gray-scale sonography with graded compression, as well as color Doppler and duplex ultrasound were performed to evaluate the lower extremity deep venous systems from the level of the common femoral vein and including the common femoral, femoral, profunda femoral, popliteal and calf veins including the posterior tibial, peroneal and gastrocnemius veins when visible. The superficial great saphenous vein was also interrogated. Spectral Doppler was utilized to evaluate flow at rest and with distal augmentation maneuvers in the common femoral, femoral and popliteal veins.  COMPARISON:  None. FINDINGS: Contralateral Common Femoral Vein: Respiratory phasicity is normal and symmetric with the symptomatic side. No evidence of thrombus. Normal compressibility. Common Femoral Vein: No evidence of thrombus. Normal compressibility, respiratory phasicity and response to augmentation. Saphenofemoral Junction: No evidence of thrombus. Normal compressibility and flow on color Doppler imaging. Profunda Femoral Vein: No evidence of thrombus. Normal compressibility and flow on color Doppler imaging. Femoral Vein: No evidence of thrombus. Normal compressibility, respiratory phasicity and response to augmentation. Popliteal Vein: No evidence of thrombus. Normal compressibility, respiratory phasicity and response to augmentation. Calf Veins: No evidence of thrombus. Normal compressibility and flow on color Doppler imaging. Superficial Great Saphenous Vein: No evidence of thrombus. Normal compressibility. Venous Reflux:  None. Other Findings:  None. IMPRESSION: No evidence of DVT within the right lower extremity. Electronically Signed   By: Sandi Mariscal M.D.   On: 12/13/2017 14:53       LAB RESULTS: Basic Metabolic Panel: Recent Labs  Lab 12/13/17 1453 12/14/17 0336  NA 139 141  K 3.6 3.5  CL 102 108  CO2 26 27  GLUCOSE 108* 112*  BUN 33* 26*  CREATININE 1.63* 1.66*  CALCIUM 9.0 8.7*  MG 2.7*  --    Liver Function Tests: Recent Labs  Lab 12/13/17 1453  AST 27  ALT 16  ALKPHOS 105  BILITOT 1.2  PROT 6.9  ALBUMIN  3.5   No results for input(s): LIPASE, AMYLASE in the last 168 hours. No results for input(s): AMMONIA in the last 168 hours. CBC: Recent Labs  Lab 12/13/17 1453 12/14/17 0336  WBC 6.3 5.7  NEUTROABS 4.3  --   HGB 10.1* 9.0*  HCT 31.8* 29.4*  MCV 89.1 88.6  PLT 192 180   Cardiac Enzymes: Recent Labs  Lab 12/13/17 1453  TROPONINI 0.05*   BNP: Invalid input(s): POCBNP CBG: No results for input(s): GLUCAP in the last 168  hours.    Disposition and Follow-up:    DISPOSITION: Residential hospice in [place], Charles Alan, MD Follow up.   Specialty:  Family Medicine Why:  as needed  Contact information: [place] [id number] [phone]            Time coordinating discharge:  25 minutes  Signed:   Estill Cotta M.D. [facility] 12/16/2017, 3:05 PM Pager: [phone]

## 2017-12-16 NOTE — Progress Notes (Signed)
Report given to [place]. PT transport to [facility]. POA aware.   Ave Filter, RN

## 2017-12-16 NOTE — Progress Notes (Signed)
CSW lvm with Olivia Mackie at [facility] regarding possible placement per Palliative Note.   CSW will continue to follow up.   [place], [place]

## 2017-12-16 NOTE — Progress Notes (Signed)
Morphine administered per order for comfort prior to foley insertion. CN and NA at bedside. Pt continues to appears restful with eyes closed.    Ave Filter, RN

## 2017-12-16 NOTE — Progress Notes (Signed)
[facility]                                                                              Patient Demographics  Jonathan Acevedo, is a 84 y.o. male, DOB - 12/18/1928, EXN:[id number]  Admit date - 12/13/2017   Admitting Physician Merton Border, MD  Outpatient Primary MD for the patient is Lawerance Cruel, MD  Outpatient specialists:   LOS - 3  days   Medical records reviewed and are as summarized below:    Chief Complaint  Patient presents with  . Leg Swelling       Brief summary   84yo M w/ a hx of CAD with remote MI and prior stent to the LAD, HTN, CKD, severeAoS (refused TAVR), recurrent falls/syncopal spells, and HLDwho was transferred from [place] to [facility] due to recurrent falls,confusion, and an unsteady gait. Over the last few weeks his caregiver Ms. Carmie End had noted a progressive decline in his ability to attend to his own ADLs.  In ED a CT head showed a moderately large right subdural hematoma with intermediate density and a few small areas of higher attenuation indicating some active bleeding, as well as an old left subdural fluid collection of 8 mm in diameter. NS was consulted, but the patient refused intervention.   Assessment & Plan     Subacute moderately large right SDH (subdural hematoma) (HCC) with chronic left SDH -CT head showed a moderately large right subdural hematoma with intermediate density, some active bleeding as well as old left subdural fluid collection -Neurosurgery was consulted, Dr. Arnoldo Morale, patient refused surgery.  Patient was seen by neurology, repeat CT head showed unchanged size of right subdural hematoma and slight decrease in left subdural hematoma, development of internal fluid- fluid level on the right. Patient's caregiver had reported progressive decline in the patient in the last few weeks.  Palliative medicine consult was obtained, respecting the patient's wishes, patient was transitioned to comfort care  status, DNR. -Currently awaiting residential hospice   Chronic systolic CHF, severe aortic stenosis, CAD -EF 25 to 30%, comfort care status   Chronic kidney disease stage III -Continue comfort care status  Recurrent falls, failure to thrive -Social work consulted for residential hospice, comfort care status  Code Status: DNR DVT Prophylaxis: SCDs Family Communication: No family member at the bedside, discussed with POA yesterday   Disposition Plan: Waiting for [facility]   Time Spent in minutes 25 minutes  Procedures:  None  Consultants:   Neurology Neurosurgery  Antimicrobials:      Medications  Scheduled Meds: . morphine CONCENTRATE  5 mg Sublingual Q6H  . sodium chloride flush  3 mL Intravenous Q12H   Continuous Infusions: . sodium chloride     PRN Meds:.sodium chloride, acetaminophen **OR** acetaminophen, LORazepam, morphine injection, ondansetron **OR** ondansetron (ZOFRAN) IV, polyethylene glycol, sodium chloride flush   Antibiotics   Anti-infectives (From admission, onward)   None        Subjective:   Jonathan Acevedo was seen and examined today.  Minimally responsive but appears to be comfortable.    Objective:   Vitals:   12/14/17 0100  12/14/17 0334 12/14/17 0738 12/15/17 1922  BP: 106/68 105/65 119/82 128/85  Pulse: (!) 57 (!) 55 61 85  Resp: 16 17 20 20   Temp: (!) 97.5 F (36.4 C) 97.7 F (36.5 C) 97.6 F (36.4 C) 98.1 F (36.7 C)  TempSrc: Axillary  Oral Oral  SpO2: 90% 90% 97% (!) 89%  Weight:      Height:       No intake or output data in the 24 hours ending 12/16/17 1245   Wt Readings from Last 3 Encounters:  12/13/17 82.8 kg  12/10/17 82.8 kg  08/07/17 78.5 kg     Exam   General: Minimally responsive, not following any commands, appears comfortable   Eyes:   HEENT:    Cardiovascular: S1 S2 clear, RRR. No pedal edema b/l  Respiratory: Clear anteriorly  Gastrointestinal: Soft, nontender, nondistended, +  bowel sounds  Ext: no pedal edema bilaterally  Neuro: Minimally responsive   Musculoskeletal:  Skin:   Psych:     Data Reviewed:  I have personally reviewed following labs and imaging studies  Micro Results No results found for this or any previous visit (from the past 240 hour(s)).  Radiology Reports Dg Chest 2 View  Result Date: 12/13/2017 CLINICAL DATA:  Chest pain, lower extremity swelling for 2 months. EXAM: CHEST - 2 VIEW COMPARISON:  Chest radiograph October 30, 2017 FINDINGS: Stable cardiomegaly. Mildly calcified aortic arch. Similar fullness of the hila most compatible with vascular shadows. Mild chronic interstitial changes without pleural effusion or focal consolidation. No pneumothorax though the LEFT lung apex is obscured by facial structures. Soft tissue planes and included osseous structures are non suspicious. Loop monitor projects over LEFT chest. IMPRESSION: 1. Stable cardiomegaly. 2. Stable chronic interstitial changes/COPD. 3. Stable fullness of the hila most compatible with vascular shadows. Electronically Signed   By: Elon Alas M.D.   On: 12/13/2017 14:29   Ct Head Wo Contrast  Result Date: 12/14/2017 CLINICAL DATA:  Follow-up of intracranial hemorrhage. EXAM: CT HEAD WITHOUT CONTRAST TECHNIQUE: Contiguous axial images were obtained from the base of the skull through the vertex without intravenous contrast. COMPARISON:  Head CT 12/13/2017 FINDINGS: Brain: Unchanged size of right convexity mixed density subdural hematoma. There is an internal hematocrit level. Thickness measures 23 mm, unchanged. Left convexity hypoattenuating hematoma measures 4 mm, previously 6 mm. Midline shift measures 4 mm to the left, previously 7 mm. Unchanged size and configuration of the lateral ventricles. The brain parenchyma is normal, without evidence of acute or chronic infarction. Vascular: No abnormal hyperdensity of the major intracranial arteries or dural venous sinuses. No  intracranial atherosclerosis. Skull: The visualized skull base, calvarium and extracranial soft tissues are normal. Sinuses/Orbits: No fluid levels or advanced mucosal thickening of the visualized paranasal sinuses. No mastoid or middle ear effusion. The orbits are normal. IMPRESSION: 1. Unchanged size of right subdural hematoma and slight decrease in size of left subdural hematoma. 2. Development of internal fluid-fluid level on the right, with separation of the more dense and more recent blood products from the less recent. 3. Improved leftward midline shift. Electronically Signed   By: Ulyses Jarred M.D.   On: 12/14/2017 01:07   Ct Head Wo Contrast  Result Date: 12/13/2017 CLINICAL DATA:  Altered level of consciousness, fell several weeks ago hitting the head EXAM: CT HEAD WITHOUT CONTRAST TECHNIQUE: Contiguous axial images were obtained from the base of the skull through the vertex without intravenous contrast. COMPARISON:  None. FINDINGS: Brain: There is a  moderately large subacute right subdural hematoma with intermediate density and some areas of higher attenuation indicating small focus of acute bleeding with layering posteriorly. On image number 21 series 2 this right subacute subdural hematoma has a depth of 2.2 cm. Frontally there is a component of this right subdural hematoma measuring 1.9 cm. The midline septum is post to the left by 7 mm. On the left there is an old left subdural fluid collection without current evidence of mass effect. This left-sided old subdural measures 8 mm in diameter. The ventricular system is slightly prominent. Only mild small vessel ischemic change is noted. No other area of hemorrhage, mass lesion, or other acute abnormality is seen. Vascular: No vascular abnormality is seen on this unenhanced study. Skull: On bone window images, no acute calvarial abnormality is seen. Sinuses/Orbits: The paranasal sinuses that are visualized appear well pneumatized. Other: None.  IMPRESSION: 1. Subacute moderately large right subdural hematoma with intermediate density and a few small areas of higher attenuation indicating some active bleeding. This right subdural hematoma measures 2.2 cm in depth. 2. Displacement of the midline septum to the left by 7 mm. 3. Old left subdural fluid collection of 8 mm in diameter. 4. Atrophy and mild small vessel ischemic change. Electronically Signed   By: Ivar Drape M.D.   On: 12/13/2017 14:28   US Venous Img Lower Right (dvt Study)  Result Date: 12/13/2017 CLINICAL DATA:  Right lower extremity pain and edema for the past 2 months. Evaluate for DVT. EXAM: RIGHT LOWER EXTREMITY VENOUS DOPPLER ULTRASOUND TECHNIQUE: Gray-scale sonography with graded compression, as well as color Doppler and duplex ultrasound were performed to evaluate the lower extremity deep venous systems from the level of the common femoral vein and including the common femoral, femoral, profunda femoral, popliteal and calf veins including the posterior tibial, peroneal and gastrocnemius veins when visible. The superficial great saphenous vein was also interrogated. Spectral Doppler was utilized to evaluate flow at rest and with distal augmentation maneuvers in the common femoral, femoral and popliteal veins. COMPARISON:  None. FINDINGS: Contralateral Common Femoral Vein: Respiratory phasicity is normal and symmetric with the symptomatic side. No evidence of thrombus. Normal compressibility. Common Femoral Vein: No evidence of thrombus. Normal compressibility, respiratory phasicity and response to augmentation. Saphenofemoral Junction: No evidence of thrombus. Normal compressibility and flow on color Doppler imaging. Profunda Femoral Vein: No evidence of thrombus. Normal compressibility and flow on color Doppler imaging. Femoral Vein: No evidence of thrombus. Normal compressibility, respiratory phasicity and response to augmentation. Popliteal Vein: No evidence of thrombus. Normal  compressibility, respiratory phasicity and response to augmentation. Calf Veins: No evidence of thrombus. Normal compressibility and flow on color Doppler imaging. Superficial Great Saphenous Vein: No evidence of thrombus. Normal compressibility. Venous Reflux:  None. Other Findings:  None. IMPRESSION: No evidence of DVT within the right lower extremity. Electronically Signed   By: Sandi Mariscal M.D.   On: 12/13/2017 14:53    Lab Data:  CBC: Recent Labs  Lab 12/13/17 1453 12/14/17 0336  WBC 6.3 5.7  NEUTROABS 4.3  --   HGB 10.1* 9.0*  HCT 31.8* 29.4*  MCV 89.1 88.6  PLT 192 657   Basic Metabolic Panel: Recent Labs  Lab 12/13/17 1453 12/14/17 0336  NA 139 141  K 3.6 3.5  CL 102 108  CO2 26 27  GLUCOSE 108* 112*  BUN 33* 26*  CREATININE 1.63* 1.66*  CALCIUM 9.0 8.7*  MG 2.7*  --    GFR: Estimated Creatinine  Clearance: 32.4 mL/min (A) (by C-G formula based on SCr of 1.66 mg/dL (H)). Liver Function Tests: Recent Labs  Lab 12/13/17 1453  AST 27  ALT 16  ALKPHOS 105  BILITOT 1.2  PROT 6.9  ALBUMIN 3.5   No results for input(s): LIPASE, AMYLASE in the last 168 hours. No results for input(s): AMMONIA in the last 168 hours. Coagulation Profile: Recent Labs  Lab 12/13/17 1453  INR 1.29   Cardiac Enzymes: Recent Labs  Lab 12/13/17 1453  TROPONINI 0.05*   BNP (last 3 results) No results for input(s): PROBNP in the last 8760 hours. HbA1C: No results for input(s): HGBA1C in the last 72 hours. CBG: No results for input(s): GLUCAP in the last 168 hours. Lipid Profile: No results for input(s): CHOL, HDL, LDLCALC, TRIG, CHOLHDL, LDLDIRECT in the last 72 hours. Thyroid Function Tests: No results for input(s): TSH, T4TOTAL, FREET4, T3FREE, THYROIDAB in the last 72 hours. Anemia Panel: No results for input(s): VITAMINB12, FOLATE, FERRITIN, TIBC, IRON, RETICCTPCT in the last 72 hours. Urine analysis:    Component Value Date/Time   COLORURINE YELLOW 07/30/2017 2155    APPEARANCEUR CLEAR 07/30/2017 2155   LABSPEC 1.016 07/30/2017 2155   PHURINE 6.0 07/30/2017 2155   GLUCOSEU NEGATIVE 07/30/2017 2155   HGBUR SMALL (A) 07/30/2017 2155   BILIRUBINUR NEGATIVE 07/30/2017 2155   KETONESUR NEGATIVE 07/30/2017 2155   PROTEINUR NEGATIVE 07/30/2017 2155   UROBILINOGEN 0.2 05/04/2012 0158   NITRITE NEGATIVE 07/30/2017 2155   LEUKOCYTESUR NEGATIVE 07/30/2017 2155     Ripudeep Rai M.D. [facility] 12/16/2017, 12:45 PM  Pager: [phone] Between 7am to 7pm - call Pager - [phone]  After 7pm go to [email or web address] - password [email or web address]  Call night coverage person covering after 7pm

## 2017-12-16 NOTE — Clinical Social Work Placement (Signed)
   CLINICAL SOCIAL WORK PLACEMENT  NOTE  Date:  12/16/2017  Patient Details  Name: Jonathan Acevedo MRN: [id number] Date of Birth: Jun 26, 1928  Clinical Social Work is seeking post-discharge placement for this patient at the [facility]) level of care (*CSW will initial, date and re-position this form in  chart as items are completed):  Yes   Patient/family provided with [place] Work Department's list of facilities offering this level of care within the geographic area requested by the patient (or if unable, by the patient's family).  Yes   Patient/family informed of their freedom to choose among providers that offer the needed level of care, that participate in Medicare, Medicaid or managed care program needed by the patient, have an available bed and are willing to accept the patient.      Patient/family informed of [facility]'s ownership interest in [facility] and [facility], as well as of the fact that they are under no obligation to receive care at these facilities.  PASRR submitted to EDS on       PASRR number received on       Existing PASRR number confirmed on       FL2 transmitted to all facilities in geographic area requested by pt/family on       FL2 transmitted to all facilities within larger geographic area on       Patient informed that his/her managed care company has contracts with or will negotiate with certain facilities, including the following:        Yes   Patient/family informed of bed offers received.  Patient chooses bed at Other - please specify in the comment section below:([facility])     Physician recommends and patient chooses bed at      Patient to be transferred to Other - please specify in the comment section below:([facility]) on 12/16/17.  Patient to be transferred to facility by [facility]     Patient family notified on 12/16/17 of transfer.  Name of family member notified:  Dr. Kerri Perches     PHYSICIAN    Additional Comment:    _______________________________________________ Alberteen Sam, LCSW 12/16/2017, 3:07 PM

## 2017-12-16 NOTE — Progress Notes (Signed)
Patient will DC QP:[facility] Anticipated DC date: 12/16/17 Family notified: Dr. Kerri Perches Transport by: Corey Harold  Per MD patient ready for DC to [facility] . RN, patient, patient's family, and facility notified of DC. Discharge Summary sent to facility (they reported they can print dc summary and no need to fax or send). RN given number for report [phone]. DC packet on chart. Ambulance transport requested for patient.  CSW signing off.  [place], [place]

## 2017-12-16 NOTE — Progress Notes (Signed)
CSW spoke with Jonathan Acevedo at [facility] who reports no beds available at this moment.   CSW relayed messaged to POA Dr. Kerri Acevedo who states that they would like to remain on [name] list and if by tomorrow evening they have no beds available, nurse Jonathan Acevedo who works at [employer] would be taking patient home with hospice.   CSW suggested that POA Dr. Kerri Acevedo and Jonathan Acevedo Acevedo into [name] as an option to relieve caregiver burden on [name] and home health could follow at [facility] [employer]. Dr. Kerri Acevedo stated they would also consider this second option and let CSW know by tomorrow evening their decision to go home with home health or [name] with home health if [facility] has no beds open up tomorrow.   CSW continue to follow with hospice.   [name], [name]

## 2017-12-16 NOTE — Clinical Social Work Note (Signed)
Clinical Social Work Assessment  Patient Details  Name: Jonathan Acevedo MRN: [id number] Date of Birth: Jun 16, 1928  Date of referral:  12/16/17               Reason for consult:  End of Life/Hospice                Permission sought to share information with:  Case Manager, Customer service manager, Family Supports Permission granted to share information::  Yes, Verbal Permission Granted  Name::     Dr. Kerri Perches  Agency::  [facility]  Relationship::  POA  Contact Information:  [phone]  Housing/Transportation Living arrangements for the past 2 months:  [place] of Information:  Patient Patient Interpreter Needed:  None Criminal Activity/Legal Involvement Pertinent to Current Situation/Hospitalization:  No - Comment as needed Significant Relationships:  [employer] Lives with:  Self Do you feel safe going back to the place where you live?  No Need for family participation in patient care:  Yes (Comment)  Care giving concerns:  CSW received referral for possible hospice placement at time of discharge. Spoke with patient POA Dr. Kerri Perches regarding possibility of hospice placement due to patient's medical conditions in which patient meets qualifications for hospice care. CSW to continue to follow and assist with discharge planning needs.     Social Worker assessment / plan:  Spoke with patient POA Dr. Kerri Perches concerning hospice options and preferences for once discharge from hospital    Employment status:  Retired Insurance underwriter information:  Managed Medicare PT Recommendations:  Not assessed at this time Information / Referral to community resources:  Other (Comment Required)(Hospice)  Patient/Family's Response to care:  Patient's POA Dr. Kerri Perches recognize need for hospice assistance at a facility and report preference for [facility]  . CSW reached out for bed availability, no beds at [facility] however Dr. Kerri Perches has accepted bed for patient at [facility].    Patient/Family's Understanding of and Emotional Response to Diagnosis, Current Treatment, and Prognosis:  Patient/family is realistic regarding patient hospice eeds and expressed being hopeful for [facility] placement. Patient expressed understanding of CSW role and discharge process as well as medical condition. No questions/concerns about plan or treatment.    Emotional Assessment Appearance:  Appears stated age Attitude/Demeanor/Rapport:  Gracious Affect (typically observed):  Accepting Orientation:  Oriented to Self, Oriented to Place, Oriented to  Time Alcohol / Substance use:  Not Applicable Psych involvement (Current and /or in the community):  No (Comment)  Discharge Needs  Concerns to be addressed:  Care Coordination, Discharge Planning Concerns Readmission within the last 30 days:  No Current discharge risk:  Dependent with Mobility Barriers to Discharge:  Continued Medical Work up, Hospice Bed not available   [employer], LCSW 12/16/2017, 3:02 PM

## 2018-01-06 DEATH — deceased
